# Patient Record
Sex: Female | Born: 1975
Health system: Southern US, Community
[De-identification: ages and names within clinical notes are randomized; demographics above are authoritative.]

## PROBLEM LIST (undated history)

## (undated) DIAGNOSIS — E041 Nontoxic single thyroid nodule: Secondary | ICD-10-CM

## (undated) DIAGNOSIS — K219 Gastro-esophageal reflux disease without esophagitis: Secondary | ICD-10-CM

## (undated) DIAGNOSIS — E538 Deficiency of other specified B group vitamins: Secondary | ICD-10-CM

## (undated) DIAGNOSIS — T4145XA Adverse effect of unspecified anesthetic, initial encounter: Secondary | ICD-10-CM

## (undated) DIAGNOSIS — R000 Tachycardia, unspecified: Secondary | ICD-10-CM

## (undated) DIAGNOSIS — R112 Nausea with vomiting, unspecified: Secondary | ICD-10-CM

## (undated) DIAGNOSIS — E559 Vitamin D deficiency, unspecified: Secondary | ICD-10-CM

## (undated) DIAGNOSIS — F419 Anxiety disorder, unspecified: Secondary | ICD-10-CM

## (undated) DIAGNOSIS — T8859XA Other complications of anesthesia, initial encounter: Secondary | ICD-10-CM

## (undated) DIAGNOSIS — R002 Palpitations: Secondary | ICD-10-CM

## (undated) DIAGNOSIS — K5900 Constipation, unspecified: Secondary | ICD-10-CM

## (undated) DIAGNOSIS — T7840XA Allergy, unspecified, initial encounter: Secondary | ICD-10-CM

## (undated) DIAGNOSIS — M1990 Unspecified osteoarthritis, unspecified site: Secondary | ICD-10-CM

## (undated) DIAGNOSIS — N926 Irregular menstruation, unspecified: Secondary | ICD-10-CM

## (undated) DIAGNOSIS — Z9889 Other specified postprocedural states: Secondary | ICD-10-CM

## (undated) DIAGNOSIS — F32A Depression, unspecified: Secondary | ICD-10-CM

## (undated) DIAGNOSIS — I4891 Unspecified atrial fibrillation: Secondary | ICD-10-CM

## (undated) DIAGNOSIS — R319 Hematuria, unspecified: Secondary | ICD-10-CM

## (undated) DIAGNOSIS — S90852A Superficial foreign body, left foot, initial encounter: Secondary | ICD-10-CM

## (undated) HISTORY — DX: Tachycardia, unspecified: R00.0

## (undated) HISTORY — DX: Anxiety disorder, unspecified: F41.9

## (undated) HISTORY — DX: Palpitations: R00.2

## (undated) HISTORY — DX: Allergy, unspecified, initial encounter: T78.40XA

## (undated) HISTORY — DX: Unspecified osteoarthritis, unspecified site: M19.90

## (undated) HISTORY — DX: Deficiency of other specified B group vitamins: E53.8

## (undated) HISTORY — DX: Constipation, unspecified: K59.00

## (undated) HISTORY — DX: Gastro-esophageal reflux disease without esophagitis: K21.9

## (undated) HISTORY — PX: TUBAL LIGATION: SHX77

## (undated) HISTORY — DX: Hematuria, unspecified: R31.9

## (undated) HISTORY — DX: Nontoxic single thyroid nodule: E04.1

## (undated) HISTORY — DX: Depression, unspecified: F32.A

## (undated) HISTORY — PX: SHOULDER ARTHROSCOPY: SHX128

## (undated) HISTORY — DX: Unspecified atrial fibrillation: I48.91

## (undated) HISTORY — DX: Vitamin D deficiency, unspecified: E55.9

---
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
## 2012-09-03 ENCOUNTER — Ambulatory Visit: Payer: Self-pay | Admitting: Family Medicine

## 2013-05-26 ENCOUNTER — Emergency Department (HOSPITAL_COMMUNITY)
Admission: EM | Admit: 2013-05-26 | Discharge: 2013-05-26 | Disposition: A | Discharge status: Home / Self Care | Payer: Medicaid Other | Attending: Emergency Medicine | Admitting: Emergency Medicine

## 2013-05-26 ENCOUNTER — Emergency Department (HOSPITAL_COMMUNITY): Payer: Medicaid Other

## 2013-05-26 ENCOUNTER — Encounter (HOSPITAL_COMMUNITY): Payer: Self-pay | Admitting: Physical Medicine and Rehabilitation

## 2013-05-26 DIAGNOSIS — R079 Chest pain, unspecified: Secondary | ICD-10-CM | POA: Insufficient documentation

## 2013-05-26 DIAGNOSIS — R05 Cough: Secondary | ICD-10-CM

## 2013-05-26 DIAGNOSIS — R1013 Epigastric pain: Secondary | ICD-10-CM | POA: Insufficient documentation

## 2013-05-26 DIAGNOSIS — R42 Dizziness and giddiness: Secondary | ICD-10-CM | POA: Insufficient documentation

## 2013-05-26 DIAGNOSIS — R0602 Shortness of breath: Secondary | ICD-10-CM | POA: Insufficient documentation

## 2013-05-26 DIAGNOSIS — R059 Cough, unspecified: Secondary | ICD-10-CM

## 2013-05-26 DIAGNOSIS — F17200 Nicotine dependence, unspecified, uncomplicated: Secondary | ICD-10-CM | POA: Insufficient documentation

## 2013-05-26 DIAGNOSIS — R12 Heartburn: Secondary | ICD-10-CM | POA: Insufficient documentation

## 2013-05-26 DIAGNOSIS — Z79899 Other long term (current) drug therapy: Secondary | ICD-10-CM | POA: Insufficient documentation

## 2013-05-26 DIAGNOSIS — R55 Syncope and collapse: Secondary | ICD-10-CM | POA: Insufficient documentation

## 2013-05-26 LAB — BASIC METABOLIC PANEL
CO2: 26 mEq/L (ref 19–32)
Chloride: 104 mEq/L (ref 96–112)
GFR calc Af Amer: >90 mL/min (ref >90)
Potassium: 3.8 mEq/L (ref 3.5–5.1)
Sodium: 138 mEq/L (ref 135–145)

## 2013-05-26 LAB — CBC WITH DIFFERENTIAL/PLATELET
Basophils Absolute: 0 10*3/uL (ref 0.0–0.1)
Basophils Relative: 0 % (ref 0–1)
HCT: 37.8 % (ref 36.0–46.0)
Lymphocytes Relative: 34 % (ref 12–46)
MCHC: 35.2 g/dL (ref 30.0–36.0)
Monocytes Absolute: 1.1 10*3/uL — ABNORMAL HIGH (ref 0.1–1.0)
Neutro Abs: 8.2 10*3/uL — ABNORMAL HIGH (ref 1.7–7.7)
Neutrophils Relative %: 57 % (ref 43–77)
RDW: 15.1 % (ref 11.5–15.5)
WBC: 14.4 10*3/uL — ABNORMAL HIGH (ref 4.0–10.5)

## 2013-05-26 LAB — D-DIMER, QUANTITATIVE: D-Dimer, Quant: <0.27 ug/mL-FEU (ref 0.00–0.48)

## 2013-05-26 LAB — POCT I-STAT TROPONIN I: Troponin i, poc: 0 ng/mL (ref 0.00–0.08)

## 2013-05-26 MED ORDER — SODIUM CHLORIDE 0.9 % IV BOLUS (SEPSIS)
1000.0000 mL | Freq: Once | INTRAVENOUS | Status: AC
  Administered 2013-05-26: 1000 mL via INTRAVENOUS

## 2013-05-26 MED ORDER — GI COCKTAIL ~~LOC~~
30.0000 mL | Freq: Once | ORAL | Status: AC
  Administered 2013-05-26: 30 mL via ORAL
  Filled 2013-05-26: qty 30

## 2013-05-26 NOTE — ED Notes (Signed)
Resident at bedside talking to pt per request to leave.

## 2013-05-26 NOTE — ED Notes (Signed)
Pt presents to department for evaluation of midsternal chest pain radiating to L shoulder. 6/10 at the time. Also states diaphoresis and SOB. Onset this morning. Pt is alert and oriented x4. respirations unlabored.

## 2013-05-26 NOTE — ED Provider Notes (Signed)
I supervised the resident in evaluation of the patient and was immediately available for consultation and decision making.   Rolan Bucco, MD 05/26/13 [phone]

## 2013-05-26 NOTE — ED Notes (Signed)
Pt requesting to leave AMA, "I want my papers, ekg, x-ray.  I want to go home.  This is ridiculous!"  Resident made aware of pt wanting to leave.

## 2013-05-26 NOTE — ED Provider Notes (Signed)
History     CSN: [id number]  Arrival date & time 05/26/13  1724   First MD Initiated Contact with Patient 05/26/13 2019      Chief Complaint  Patient presents with  . Chest Pain  . Dizziness    (Consider location/radiation/quality/duration/timing/severity/associated sxs/prior treatment) Patient is a 37 y.o. female presenting with chest pain.  Chest Pain Pain location:  Substernal area and epigastric Pain quality: aching and pressure   Pain radiates to:  L shoulder Pain radiates to the back: no   Pain severity:  Moderate Onset quality:  Gradual Duration: since awakening this am. Timing:  Intermittent Progression:  Waxing and waning Chronicity:  New Context: breathing, movement and raising an arm   Context: not eating, not at rest and no stress   Relieved by: being still. Worsened by:  Nothing tried Ineffective treatments:  None tried Associated symptoms: abdominal pain, cough, heartburn, near-syncope and shortness of breath   Associated symptoms: no back pain, no claudication, no dizziness, no fever, no nausea, no numbness, no orthopnea, no palpitations, no PND, no syncope and not vomiting   Risk factors: birth control and smoking   Risk factors: no prior DVT/PE     No past medical history on file.  No past surgical history on file.  No family history on file.  History  Substance Use Topics  . Smoking status: Current Every Day Smoker    Types: Cigarettes  . Smokeless tobacco: Not on file  . Alcohol Use: No    OB History   Grav Para Term Preterm Abortions TAB SAB Ect Mult Living                  Review of Systems  Constitutional: Negative for fever and chills.  HENT: Negative for congestion, sore throat and rhinorrhea.   Eyes: Negative for photophobia and visual disturbance.  Respiratory: Positive for cough and shortness of breath.   Cardiovascular: Positive for chest pain and near-syncope. Negative for palpitations, orthopnea, claudication, leg swelling,  syncope and PND.  Gastrointestinal: Positive for heartburn and abdominal pain. Negative for nausea, vomiting, diarrhea and constipation.  Endocrine: Negative for polyphagia and polyuria.  Genitourinary: Negative for dysuria, flank pain, vaginal bleeding, vaginal discharge and enuresis.  Musculoskeletal: Negative for back pain and gait problem.  Skin: Negative for color change and rash.  Neurological: Negative for dizziness, syncope, light-headedness and numbness.  Hematological: Negative for adenopathy. Does not bruise/bleed easily.  All other systems reviewed and are negative.    Allergies  Review of patient's allergies indicates no known allergies.  Home Medications   Current Outpatient Rx  Name  Route  Sig  Dispense  Refill  . desogestrel-ethinyl estradiol (EMOQUETTE) 0.15-30 MG-MCG tablet   Oral   Take 1 tablet by mouth daily.           BP 109/75  Pulse 91  Temp(Src) 99 F (37.2 C) (Oral)  Resp 16  SpO2 100%  LMP 03/26/2013  Physical Exam  Vitals reviewed. Constitutional: She is oriented to person, place, and time. She appears well-developed and well-nourished.  HENT:  Head: Normocephalic and atraumatic.  Right Ear: External ear normal.  Left Ear: External ear normal.  Eyes: Conjunctivae and EOM are normal. Pupils are equal, round, and reactive to light.  Neck: Normal range of motion. Neck supple.  Cardiovascular: Normal rate, regular rhythm, normal heart sounds and intact distal pulses.   Pulmonary/Chest: Effort normal and breath sounds normal.  Abdominal: Soft. Bowel sounds are normal. There is  tenderness in the epigastric area and left upper quadrant.  Musculoskeletal: Normal range of motion.  Neurological: She is alert and oriented to person, place, and time.  Skin: Skin is warm and dry.    ED Course  Procedures (including critical care time)  Labs Reviewed  CBC WITH DIFFERENTIAL - Abnormal; Notable for the following:    WBC 14.4 (*)    Neutro Abs 8.2  (*)    Lymphs Abs 4.9 (*)    Monocytes Absolute 1.1 (*)    All other components within normal limits  BASIC METABOLIC PANEL  D-DIMER, QUANTITATIVE  POCT I-STAT TROPONIN I   Dg Chest 2 View  05/26/2013   *RADIOLOGY REPORT*  Clinical Data:  Chest pain, dizziness and nausea.  CHEST - 2 VIEW  Comparison: None  Findings: The heart size and mediastinal contours are within normal limits.  Both lungs are clear.  The visualized skeletal structures are unremarkable.  IMPRESSION: No active disease.   Original Report Authenticated By: Irish Lack, M.D.     1. Chest pain at rest   2. Cough   3. Shortness of breath    Results for orders placed during the hospital encounter of 05/26/13  CBC WITH DIFFERENTIAL      Result Value Range   WBC 14.4 (*) 4.0 - 10.5 K/uL   RBC 4.56  3.87 - 5.11 MIL/uL   Hemoglobin 13.3  12.0 - 15.0 g/dL   HCT 16.1  09.6 - 04.5 %   MCV 82.9  78.0 - 100.0 fL   MCH 29.2  26.0 - 34.0 pg   MCHC 35.2  30.0 - 36.0 g/dL   RDW 40.9  81.1 - 91.4 %   Platelets 326  150 - 400 K/uL   Neutrophils Relative % 57  43 - 77 %   Neutro Abs 8.2 (*) 1.7 - 7.7 K/uL   Lymphocytes Relative 34  12 - 46 %   Lymphs Abs 4.9 (*) 0.7 - 4.0 K/uL   Monocytes Relative 8  3 - 12 %   Monocytes Absolute 1.1 (*) 0.1 - 1.0 K/uL   Eosinophils Relative 1  0 - 5 %   Eosinophils Absolute 0.2  0.0 - 0.7 K/uL   Basophils Relative 0  0 - 1 %   Basophils Absolute 0.0  0.0 - 0.1 K/uL  BASIC METABOLIC PANEL      Result Value Range   Sodium 138  135 - 145 mEq/L   Potassium 3.8  3.5 - 5.1 mEq/L   Chloride 104  96 - 112 mEq/L   CO2 26  19 - 32 mEq/L   Glucose, Bld 97  70 - 99 mg/dL   BUN 12  6 - 23 mg/dL   Creatinine, Ser 7.82  0.50 - 1.10 mg/dL   Calcium 9.2  8.4 - 95.6 mg/dL   GFR calc non Af Amer >90  >90 mL/min   GFR calc Af Amer >90  >90 mL/min  D-DIMER, QUANTITATIVE      Result Value Range   D-Dimer, Quant <0.27  0.00 - 0.48 ug/mL-FEU  POCT I-STAT TROPONIN I      Result Value Range   Troponin  i, poc 0.00  0.00 - 0.08 ng/mL   Comment 3               MDM   37 y.o. female  without pertinent PMH presents with pleuritic substernal/epigastric pain beginning this AM, without history of same.  Pt does not endorse GI symptoms,  however has had cough over the course of today, no hemoptysis.  She also endorses lightheadedness and near syncope.  Pain worse with arm movement, nonexertional, musculoskeletal/pulmonary in nature.  TIMI 0, unable to [facility] for HR, OCP use.  Low risk via [place], no unilateral leg swelling or recent immobilization.  Checked labs, ecg, and cxr as above, all unremarkable.  Physical exam with abd tenderness, otherwise unremarkable.  Pt given GI cocktail with some relief of symptoms.  Doubt ACS, PTX, PNA, PE, mediastinitis, pericarditis, or other emergent pathology given history, physical, and overall clinical scenario.  Additionally doubt cardiogenic cause of lightheadedness given symptoms present all day and mild in nature.  Pt given strict return precautions, voices understanding, and agreed to fu.  .    Labs and imaging as above reviewed by myself and attending,Dr. Fredderick Phenix, with whom case was discussed.   1. Chest pain at rest   2. Cough   3. Shortness of breath             Noel Gerold, MD 05/26/13 2329

## 2013-07-15 ENCOUNTER — Ambulatory Visit (INDEPENDENT_AMBULATORY_CARE_PROVIDER_SITE_OTHER): Payer: Medicaid Other | Admitting: Sports Medicine

## 2013-07-15 VITALS — BP 116/68 | HR 70 | Wt 173.0 lb

## 2013-07-15 DIAGNOSIS — M25572 Pain in left ankle and joints of left foot: Secondary | ICD-10-CM

## 2013-07-15 DIAGNOSIS — M7750 Other enthesopathy of unspecified foot: Secondary | ICD-10-CM

## 2013-07-15 DIAGNOSIS — M7742 Metatarsalgia, left foot: Secondary | ICD-10-CM

## 2013-07-15 DIAGNOSIS — S90852A Superficial foreign body, left foot, initial encounter: Secondary | ICD-10-CM | POA: Insufficient documentation

## 2013-07-15 DIAGNOSIS — M722 Plantar fascial fibromatosis: Secondary | ICD-10-CM

## 2013-07-15 NOTE — Assessment & Plan Note (Signed)
Metatarsal pad placed. Like her to come back for custom orthotics. Also would like x-rays, whether she brings me the existing films or we get new films, to further follow this history of a foreign body. 

## 2013-07-15 NOTE — Assessment & Plan Note (Addendum)
Metatarsal pad placed. Like her to come back for custom orthotics. Also would like x-rays, whether she brings me the existing films or we get new films, to further follow this history of a foreign body.

## 2013-07-15 NOTE — Progress Notes (Addendum)
  Subjective:    CC: Left foot pain  HPI:  Kelsey Vaughan is a very pleasant 37 year old female, she is my Engineer, civil (consulting).  For several years she's noted pain that she localizes both distal 2, under, and just proximal to her third metatarsal head from a plantar aspect. She does not recall any trauma nor stepping on anything. She did see a physician in the distant past, an x-ray was done but supposedly showed a foreign body, I do not have the films for review. Pain has been persistent, localized, no radiation, moderate.  Past medical history, Surgical history, Family history not pertinant except as noted below, Social history, Allergies, and medications have been entered into the medical record, reviewed, and no changes needed.   Review of Systems: No headache, visual changes, nausea, vomiting, diarrhea, constipation, dizziness, abdominal pain, skin rash, fevers, chills, night sweats, swollen lymph nodes, weight loss, chest pain, body aches, joint swelling, muscle aches, shortness of breath, mood changes, visual or auditory hallucinations.  Objective:    General: Well Developed, well nourished, and in no acute distress.  Neuro: Alert and oriented x3, extra-ocular muscles intact, sensation grossly intact.  HEENT: Normocephalic, atraumatic, pupils equal round reactive to light, neck supple, no masses, no lymphadenopathy, thyroid nonpalpable.  Skin: Warm and dry, no rashes noted.  Cardiac: Regular rate and rhythm, no murmurs rubs or gallops.  Respiratory: Clear to auscultation bilaterally. Not using accessory muscles, speaking in full sentences.  Abdominal: Soft, nontender, nondistended, positive bowel sounds, no masses, no organomegaly.  Left Foot: No visible erythema or swelling. Range of motion is full in all directions. Strength is 5/5 in all directions. No hallux valgus. No pes cavus or pes planus. No abnormal callus noted. No pain over the navicular prominence, or base of fifth metatarsal. No tenderness  to palpation of the calcaneal insertion of plantar fascia. There is some palpable tenderness under the third metatarsal head, she also has palpable plantar fascial fibromatosis. No pain at the Achilles insertion. No pain over the calcaneal bursa. No pain of the retrocalcaneal bursa. No tenderness to palpation over the tarsals, metatarsals, or phalanges. No hallux rigidus or limitus. No tenderness palpation over interphalangeal joints. No pain with compression of the metatarsal heads. Neurovascularly intact distally.  Medium metatarsal pad was placed in her Left shoe.  Impression and Recommendations:    The patient was counselled, risk factors were discussed, anticipatory guidance given.

## 2013-07-16 ENCOUNTER — Ambulatory Visit (HOSPITAL_BASED_OUTPATIENT_CLINIC_OR_DEPARTMENT_OTHER)
Admission: RE | Admit: 2013-07-16 | Discharge: 2013-07-16 | Disposition: A | Discharge status: Home / Self Care | Payer: Medicaid Other | Source: Ambulatory Visit | Attending: Sports Medicine | Admitting: Sports Medicine

## 2013-07-16 ENCOUNTER — Encounter: Payer: Self-pay | Admitting: Sports Medicine

## 2013-07-16 ENCOUNTER — Ambulatory Visit: Payer: Medicaid Other | Admitting: Sports Medicine

## 2013-07-16 ENCOUNTER — Ambulatory Visit (INDEPENDENT_AMBULATORY_CARE_PROVIDER_SITE_OTHER): Payer: Medicaid Other | Admitting: Sports Medicine

## 2013-07-16 VITALS — BP 117/62 | HR 83 | Wt 171.0 lb

## 2013-07-16 DIAGNOSIS — IMO0002 Reserved for concepts with insufficient information to code with codable children: Secondary | ICD-10-CM

## 2013-07-16 DIAGNOSIS — M25579 Pain in unspecified ankle and joints of unspecified foot: Secondary | ICD-10-CM | POA: Insufficient documentation

## 2013-07-16 DIAGNOSIS — S90852A Superficial foreign body, left foot, initial encounter: Secondary | ICD-10-CM

## 2013-07-16 DIAGNOSIS — M25572 Pain in left ankle and joints of left foot: Secondary | ICD-10-CM

## 2013-07-16 DIAGNOSIS — M722 Plantar fascial fibromatosis: Secondary | ICD-10-CM

## 2013-07-16 HISTORY — DX: Superficial foreign body, left foot, initial encounter: S90.852A

## 2013-07-16 MED ORDER — HYDROCODONE-ACETAMINOPHEN 10-325 MG PO TABS: 1.0000 | ORAL_TABLET | Freq: Three times a day (TID) | ORAL | Status: DC | PRN

## 2013-07-16 NOTE — Progress Notes (Addendum)
  Subjective:    CC: Follow up  HPI: Kelsey Vaughan comes back for followup of left foot pain. We recently got x-rays, the results of which will be dictated below. She continues that pain is localized to the plantar aspect of the foot, sharp, doesn't radiate, moderate, persistent. Worse with weightbearing.  Past medical history, Surgical history, Family history not pertinant except as noted below, Social history, Allergies, and medications have been entered into the medical record, reviewed, and no changes needed.   Review of Systems: No fevers, chills, night sweats, weight loss, chest pain, or shortness of breath.   Objective:    General: Well Developed, well nourished, and in no acute distress.  Neuro: Alert and oriented x3, extra-ocular muscles intact, sensation grossly intact.  HEENT: Normocephalic, atraumatic, pupils equal round reactive to light, neck supple, no masses, no lymphadenopathy, thyroid nonpalpable.  Skin: Warm and dry, no rashes. Cardiac: Regular rate and rhythm, no murmurs rubs or gallops, no lower extremity edema.  Respiratory: Clear to auscultation bilaterally. Not using accessory muscles, speaking in full sentences.  X-rays are reviewed and show what appears to be broken end of a needle at the level of the base of the fifth metatarsal, and just plantar to the lateral aspect of the cuboid. It is approximately 0.7 cm long, and 0.9 cm from the skin.  Procedure: Limited ultrasound of left foot Device: GE logiq E. Findings: There was a hyperechoic structure just medial to the base of the fifth metatarsal bone with acoustic shadowing. The overlying skin was marked at this point. Impression: Metallic foreign body. Images permanently stored in the unit and are available for review.  Procedure: Real-time Ultrasound Guided tibial nerve block Device: GE Logiq E  Verbal informed consent obtained.  Time-out conducted.  Noted no overlying erythema, induration, or other signs of local  infection.  Skin prepped in a sterile fashion.  Local anesthesia: Topical Ethyl chloride.  With sterile technique and under real time ultrasound guidance:  5 cc lidocaine injected around the tibial nerve just posterior to the medial malleolus. Completed without difficulty  Numbness in the plantar region of the foot within 10 minutes. Advised to call if fevers/chills, erythema, induration, drainage, or persistent bleeding.  Images permanently stored and available for review in the ultrasound unit.  Impression: Technically successful ultrasound guided peripheral nerve block.  Procedure:  Excision of  left foot foreign body Risks, benefits, and alternatives explained and consent obtained. Time out conducted. Surface prepped with alcohol. Tibial nerve block performed as above. 5cc lidocaine with epinephine infiltrated in a field block. Adequate anesthesia ensured. Area prepped and draped in a sterile fashion. 2 cm incision made with a #11 blade, extensive dissection performed with curved hemostat, I was unable to reach the foreign body although I was able to feel it at the tip of the hemostat, after 30 minutes procedure aborted. 3-0 suture used in a horizontal mattress to close the wound. A small amount of Dermabond was used over the incision. The wound was then dressed. Hemostasis achieved. Pt stable.  30 mg of intramuscular Toradol given for postoperative pain control.  Impression and Recommendations:

## 2013-07-16 NOTE — Assessment & Plan Note (Signed)
Appears to be a small needle end. Attempted exploration, was unable to find a small needle. I am going to refer her to Dr. Toni Arthurs with foot and ankle surgery. Hydrocodone 10 for pain. Toradol 30 mg intramuscular. Return on Monday to recheck wound.

## 2013-07-16 NOTE — Addendum Note (Signed)
Addended by: Monica Becton on: 07/16/2013 12:08 PM   Modules accepted: Orders

## 2013-07-16 NOTE — Addendum Note (Signed)
Addended by: Monica Becton on: 07/16/2013 09:08 AM   Modules accepted: Orders, Level of Service

## 2013-07-28 ENCOUNTER — Ambulatory Visit (INDEPENDENT_AMBULATORY_CARE_PROVIDER_SITE_OTHER): Payer: Medicaid Other | Admitting: Sports Medicine

## 2013-07-28 DIAGNOSIS — S90852D Superficial foreign body, left foot, subsequent encounter: Secondary | ICD-10-CM

## 2013-07-28 DIAGNOSIS — Z5189 Encounter for other specified aftercare: Secondary | ICD-10-CM

## 2013-07-28 NOTE — Assessment & Plan Note (Signed)
Sutures removed, she does have a followup with orthopedic surgeon for consideration of exploration and removal of the foreign body.

## 2013-07-28 NOTE — Progress Notes (Signed)
Sutures removed, wound looks good, clean, dry, and intact.

## 2013-07-29 ENCOUNTER — Ambulatory Visit: Payer: Self-pay | Admitting: Sports Medicine

## 2013-07-30 ENCOUNTER — Encounter (HOSPITAL_BASED_OUTPATIENT_CLINIC_OR_DEPARTMENT_OTHER): Payer: Self-pay | Admitting: *Deleted

## 2013-08-04 ENCOUNTER — Other Ambulatory Visit: Payer: Self-pay | Admitting: Orthopedic Surgery

## 2013-08-05 ENCOUNTER — Ambulatory Visit (HOSPITAL_BASED_OUTPATIENT_CLINIC_OR_DEPARTMENT_OTHER)
Admission: RE | Admit: 2013-08-05 | Discharge: 2013-08-05 | Disposition: A | Discharge status: Home / Self Care | Payer: Medicaid Other | Source: Ambulatory Visit | Attending: Orthopedic Surgery | Admitting: Orthopedic Surgery

## 2013-08-05 ENCOUNTER — Encounter (HOSPITAL_BASED_OUTPATIENT_CLINIC_OR_DEPARTMENT_OTHER): Payer: Self-pay | Admitting: Certified Registered Nurse Anesthetist

## 2013-08-05 ENCOUNTER — Encounter (HOSPITAL_BASED_OUTPATIENT_CLINIC_OR_DEPARTMENT_OTHER)
Admission: RE | Disposition: A | Discharge status: Home / Self Care | Payer: Self-pay | Source: Ambulatory Visit | Attending: Orthopedic Surgery

## 2013-08-05 ENCOUNTER — Ambulatory Visit (HOSPITAL_BASED_OUTPATIENT_CLINIC_OR_DEPARTMENT_OTHER): Payer: Medicaid Other | Admitting: Certified Registered Nurse Anesthetist

## 2013-08-05 DIAGNOSIS — Z6831 Body mass index (BMI) 31.0-31.9, adult: Secondary | ICD-10-CM | POA: Insufficient documentation

## 2013-08-05 DIAGNOSIS — J449 Chronic obstructive pulmonary disease, unspecified: Secondary | ICD-10-CM | POA: Insufficient documentation

## 2013-08-05 DIAGNOSIS — F17200 Nicotine dependence, unspecified, uncomplicated: Secondary | ICD-10-CM | POA: Insufficient documentation

## 2013-08-05 DIAGNOSIS — J4489 Other specified chronic obstructive pulmonary disease: Secondary | ICD-10-CM | POA: Insufficient documentation

## 2013-08-05 DIAGNOSIS — S90852D Superficial foreign body, left foot, subsequent encounter: Secondary | ICD-10-CM

## 2013-08-05 DIAGNOSIS — M795 Residual foreign body in soft tissue: Secondary | ICD-10-CM | POA: Insufficient documentation

## 2013-08-05 DIAGNOSIS — N926 Irregular menstruation, unspecified: Secondary | ICD-10-CM | POA: Insufficient documentation

## 2013-08-05 DIAGNOSIS — E669 Obesity, unspecified: Secondary | ICD-10-CM | POA: Insufficient documentation

## 2013-08-05 DIAGNOSIS — Z1810 Retained metal fragments, unspecified: Secondary | ICD-10-CM | POA: Insufficient documentation

## 2013-08-05 HISTORY — DX: Superficial foreign body, left foot, initial encounter: S90.852A

## 2013-08-05 HISTORY — DX: Irregular menstruation, unspecified: N92.6

## 2013-08-05 HISTORY — PX: FOREIGN BODY REMOVAL: SHX962

## 2013-08-05 SURGERY — FOREIGN BODY REMOVAL ADULT
Anesthesia: Regional | Site: Foot | Laterality: Left | Wound class: Clean

## 2013-08-05 MED ORDER — SODIUM CHLORIDE 0.9 % IV SOLN: INTRAVENOUS | Status: DC

## 2013-08-05 MED ORDER — HYDROMORPHONE HCL PF 1 MG/ML IJ SOLN
0.2500 mg | INTRAMUSCULAR | Status: DC | PRN
  Administered 2013-08-05: 0.5 mg via INTRAVENOUS

## 2013-08-05 MED ORDER — LIDOCAINE HCL (CARDIAC) 20 MG/ML IV SOLN
INTRAVENOUS | Status: DC | PRN
  Administered 2013-08-05: 60 mg via INTRAVENOUS

## 2013-08-05 MED ORDER — OXYCODONE HCL 5 MG PO TABS: 5.0000 mg | ORAL_TABLET | Freq: Once | ORAL | Status: DC | PRN

## 2013-08-05 MED ORDER — PROMETHAZINE HCL 25 MG/ML IJ SOLN: 6.2500 mg | INTRAMUSCULAR | Status: DC | PRN

## 2013-08-05 MED ORDER — FENTANYL CITRATE 0.05 MG/ML IJ SOLN
INTRAMUSCULAR | Status: DC | PRN
  Administered 2013-08-05 (×2): 25 ug via INTRAVENOUS
  Administered 2013-08-05 (×2): 50 ug via INTRAVENOUS

## 2013-08-05 MED ORDER — FENTANYL CITRATE 0.05 MG/ML IJ SOLN: 50.0000 ug | Freq: Once | INTRAMUSCULAR | Status: DC

## 2013-08-05 MED ORDER — OXYCODONE HCL 5 MG/5ML PO SOLN: 5.0000 mg | Freq: Once | ORAL | Status: DC | PRN

## 2013-08-05 MED ORDER — PROPOFOL 10 MG/ML IV BOLUS
INTRAVENOUS | Status: DC | PRN
  Administered 2013-08-05: 200 mg via INTRAVENOUS

## 2013-08-05 MED ORDER — FENTANYL CITRATE 0.05 MG/ML IJ SOLN: 50.0000 ug | INTRAMUSCULAR | Status: DC | PRN

## 2013-08-05 MED ORDER — CHLORHEXIDINE GLUCONATE 4 % EX LIQD: 60.0000 mL | Freq: Once | CUTANEOUS | Status: DC

## 2013-08-05 MED ORDER — LACTATED RINGERS IV SOLN
INTRAVENOUS | Status: DC
  Administered 2013-08-05: 12:00:00 via INTRAVENOUS
  Administered 2013-08-05: 20 mL/h via INTRAVENOUS

## 2013-08-05 MED ORDER — CEFAZOLIN SODIUM-DEXTROSE 2-3 GM-% IV SOLR
2.0000 g | INTRAVENOUS | Status: AC
  Administered 2013-08-05: 2 g via INTRAVENOUS

## 2013-08-05 MED ORDER — HYDROCODONE-ACETAMINOPHEN 5-325 MG PO TABS: 10.0000 | ORAL_TABLET | Freq: Four times a day (QID) | ORAL | Status: DC | PRN

## 2013-08-05 MED ORDER — MIDAZOLAM HCL 2 MG/2ML IJ SOLN: 1.0000 mg | INTRAMUSCULAR | Status: DC | PRN

## 2013-08-05 MED ORDER — MIDAZOLAM HCL 5 MG/5ML IJ SOLN
INTRAMUSCULAR | Status: DC | PRN
  Administered 2013-08-05: 2 mg via INTRAVENOUS

## 2013-08-05 MED ORDER — BUPIVACAINE HCL (PF) 0.25 % IJ SOLN
INTRAMUSCULAR | Status: DC | PRN
  Administered 2013-08-05: 3 mL

## 2013-08-05 MED ORDER — MIDAZOLAM HCL 2 MG/ML PO SYRP: 12.0000 mg | ORAL_SOLUTION | Freq: Once | ORAL | Status: DC | PRN

## 2013-08-05 MED ORDER — DEXAMETHASONE SODIUM PHOSPHATE 10 MG/ML IJ SOLN
INTRAMUSCULAR | Status: DC | PRN
  Administered 2013-08-05: 10 mg via INTRAVENOUS

## 2013-08-05 SURGICAL SUPPLY — 63 items
BAG DECANTER FOR FLEXI CONT (MISCELLANEOUS) ×2 IMPLANT
BANDAGE ESMARK 6X9 LF (GAUZE/BANDAGES/DRESSINGS) IMPLANT
BLADE SURG 15 STRL LF DISP TIS (BLADE) ×1 IMPLANT
BLADE SURG 15 STRL SS (BLADE) ×1
BNDG COHESIVE 4X5 TAN STRL (GAUZE/BANDAGES/DRESSINGS) ×2 IMPLANT
BNDG COHESIVE 6X5 TAN STRL LF (GAUZE/BANDAGES/DRESSINGS) IMPLANT
BNDG ESMARK 4X9 LF (GAUZE/BANDAGES/DRESSINGS) ×2 IMPLANT
BNDG ESMARK 6X9 LF (GAUZE/BANDAGES/DRESSINGS)
CHLORAPREP W/TINT 26ML (MISCELLANEOUS) ×2 IMPLANT
CLOTH BEACON ORANGE TIMEOUT ST (SAFETY) ×2 IMPLANT
COVER TABLE BACK 60X90 (DRAPES) ×2 IMPLANT
CUFF TOURNIQUET SINGLE 34IN LL (TOURNIQUET CUFF) ×2 IMPLANT
DECANTER SPIKE VIAL GLASS SM (MISCELLANEOUS) IMPLANT
DRAPE EXTREMITY T 121X128X90 (DRAPE) ×2 IMPLANT
DRAPE OEC MINIVIEW 54X84 (DRAPES) ×2 IMPLANT
DRAPE SURG 17X23 STRL (DRAPES) IMPLANT
DRAPE U-SHAPE 47X51 STRL (DRAPES) ×2 IMPLANT
DRSG EMULSION OIL 3X3 NADH (GAUZE/BANDAGES/DRESSINGS) ×2 IMPLANT
DRSG PAD ABDOMINAL 8X10 ST (GAUZE/BANDAGES/DRESSINGS) IMPLANT
ELECT REM PT RETURN 9FT ADLT (ELECTROSURGICAL) ×2
ELECTRODE REM PT RTRN 9FT ADLT (ELECTROSURGICAL) ×1 IMPLANT
GLOVE BIO SURGEON STRL SZ8 (GLOVE) ×2 IMPLANT
GLOVE BIOGEL M STRL SZ7.5 (GLOVE) ×2 IMPLANT
GLOVE BIOGEL PI IND STRL 8 (GLOVE) ×2 IMPLANT
GLOVE BIOGEL PI INDICATOR 8 (GLOVE) ×2
GLOVE EXAM NITRILE MD LF STRL (GLOVE) IMPLANT
GOWN PREVENTION PLUS XLARGE (GOWN DISPOSABLE) ×2 IMPLANT
GOWN PREVENTION PLUS XXLARGE (GOWN DISPOSABLE) ×2 IMPLANT
NEEDLE HYPO 22GX1.5 SAFETY (NEEDLE) IMPLANT
PACK BASIN DAY SURGERY FS (CUSTOM PROCEDURE TRAY) ×2 IMPLANT
PAD CAST 4YDX4 CTTN HI CHSV (CAST SUPPLIES) ×1 IMPLANT
PADDING CAST ABS 4INX4YD NS (CAST SUPPLIES)
PADDING CAST ABS COTTON 4X4 ST (CAST SUPPLIES) IMPLANT
PADDING CAST COTTON 4X4 STRL (CAST SUPPLIES) ×1
PADDING CAST COTTON 6X4 STRL (CAST SUPPLIES) IMPLANT
PENCIL BUTTON HOLSTER BLD 10FT (ELECTRODE) IMPLANT
SANITIZER HAND PURELL 535ML FO (MISCELLANEOUS) ×2 IMPLANT
SHEET MEDIUM DRAPE 40X70 STRL (DRAPES) ×2 IMPLANT
SPLINT FAST PLASTER 5X30 (CAST SUPPLIES)
SPLINT PLASTER CAST FAST 5X30 (CAST SUPPLIES) IMPLANT
SPONGE GAUZE 4X4 12PLY (GAUZE/BANDAGES/DRESSINGS) ×2 IMPLANT
SPONGE LAP 18X18 X RAY DECT (DISPOSABLE) ×2 IMPLANT
STAPLER VISISTAT 35W (STAPLE) IMPLANT
STOCKINETTE 6  STRL (DRAPES) ×1
STOCKINETTE 6 STRL (DRAPES) ×1 IMPLANT
STRIP CLOSURE SKIN 1/2X4 (GAUZE/BANDAGES/DRESSINGS) IMPLANT
SUCTION FRAZIER TIP 10 FR DISP (SUCTIONS) IMPLANT
SUT ETHIBOND 0 MO6 C/R (SUTURE) IMPLANT
SUT ETHIBOND 2 OS 4 DA (SUTURE) IMPLANT
SUT ETHILON 3 0 PS 1 (SUTURE) ×2 IMPLANT
SUT MNCRL AB 3-0 PS2 18 (SUTURE) ×2 IMPLANT
SUT MNCRL AB 4-0 PS2 18 (SUTURE) IMPLANT
SUT VIC AB 0 CT1 27 (SUTURE)
SUT VIC AB 0 CT1 27XBRD ANBCTR (SUTURE) IMPLANT
SUT VIC AB 2-0 SH 18 (SUTURE) IMPLANT
SUT VIC AB 2-0 SH 27 (SUTURE)
SUT VIC AB 2-0 SH 27XBRD (SUTURE) IMPLANT
SUT VICRYL 4-0 PS2 18IN ABS (SUTURE) IMPLANT
SYR BULB 3OZ (MISCELLANEOUS) ×2 IMPLANT
TOWEL OR 17X24 6PK STRL BLUE (TOWEL DISPOSABLE) ×2 IMPLANT
TOWEL OR NON WOVEN STRL DISP B (DISPOSABLE) ×2 IMPLANT
TUBE CONNECTING 20X1/4 (TUBING) IMPLANT
UNDERPAD 30X30 INCONTINENT (UNDERPADS AND DIAPERS) ×2 IMPLANT

## 2013-08-05 NOTE — Brief Op Note (Signed)
08/05/2013  1:05 PM  PATIENT:  Kelsey Vaughan  37 y.o. female  PRE-OPERATIVE DIAGNOSIS:  left foot foreign body  POST-OPERATIVE DIAGNOSIS:  Left Foot foreign body  Procedure(s): 1.  Removal of foreign body from the deep soft tissues of the plantar left foot 2.  AP and lateral radiographs of the left foot  SURGEON:  Toni Arthurs, MD  ASSISTANT: n/a  ANESTHESIA:   General  EBL:  minimal  TOURNIQUET:  Less than 10 min with an ankle esmarch  COMPLICATIONS:  None apparent  DISPOSITION:  Extubated, awake and stable to recovery.  DICTATION ID:  [id number]

## 2013-08-05 NOTE — Op Note (Deleted)
NAME:  Sonier, Cova           ACCOUNT NO.:  [id number]  MEDICAL RECORD NO.:  [id number]  LOCATION:  XRAY                          FACILITY:  [facility]  PHYSICIAN:  Brendia Dampier, MD        DATE OF BIRTH:  12/26/1975  DATE OF PROCEDURE:  08/05/2013 DATE OF DISCHARGE:  07/16/2013                              OPERATIVE REPORT   ADDENDUM:  RADIOGRAPHS:  AP and lateral radiographs of the left foot are obtained intraoperatively today.  These show interval removal of the metallic foreign body from the deep plantar soft tissues of the left foot.  No fracture dislocation is noted.  Alignment is otherwise normal.     Jatorian Renault, MD     JH/MEDQ  D:  08/05/2013  T:  08/05/2013  Job:  [id number] 

## 2013-08-05 NOTE — Anesthesia Procedure Notes (Signed)
Procedure Name: LMA Insertion Date/Time: 08/05/2013 12:32 PM Performed by: Annikah Lovins D Pre-anesthesia Checklist: Patient identified, Emergency Drugs available, Suction available and Patient being monitored Patient Re-evaluated:Patient Re-evaluated prior to inductionOxygen Delivery Method: Circle System Utilized Preoxygenation: Pre-oxygenation with 100% oxygen Intubation Type: IV induction Ventilation: Mask ventilation without difficulty LMA: LMA inserted LMA Size: 4.0 Number of attempts: 1 Airway Equipment and Method: bite block Placement Confirmation: positive ETCO2 Tube secured with: Tape Dental Injury: Teeth and Oropharynx as per pre-operative assessment

## 2013-08-05 NOTE — Op Note (Signed)
Kelsey Vaughan, Kelsey Vaughan NO.:  [id number]  MEDICAL RECORD NO.:  [id number]  LOCATION:  XRAY                          FACILITY:  [facility]  PHYSICIAN:  Toni Arthurs, MD        DATE OF BIRTH:  1976-06-14  DATE OF PROCEDURE:  08/05/2013 DATE OF DISCHARGE:  07/16/2013                              OPERATIVE REPORT   PREOPERATIVE DIAGNOSIS:  Painful foreign body, left foot.  POSTOPERATIVE DIAGNOSIS:  Painful foreign body, left foot.  PROCEDURES: 1. Removal of foreign body from the deep soft tissues of the plantar     left foot. 2. AP and lateral radiographs of the left foot pain.  SURGEON:  Toni Arthurs, MD.  ANESTHESIA:  General.  ESTIMATED BLOOD LOSS:  Minimal.  TOURNIQUET TIME:  Less than 10 minutes with an ankle Esmarch.  COMPLICATIONS:  None apparent.  DISPOSITION:  Extubated, awake, and stable to recovery.  INDICATION FOR PROCEDURE:  The patient is a 37 year old woman who began complaining of pain on the plantar aspect of the left foot several weeks ago.  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  This was unsuccessful and she was sent to me for further evaluation and treatment of this condition.  She presents now for removal of this metallic foreign body from the deep soft tissues of the plantar left foot.  She understands the risks and benefits, the alternative treatment options, and elects surgical treatment.  She specifically understands the risks of bleeding, infection, nerve damage, blood clots, need for additional surgery, amputation, and death.  PROCEDURE IN DETAIL:  After preoperative consent was obtained, the correct operative site was identified.  The patient was brought to the operating room and placed supine on the operating table.  General anesthesia was induced.  Preoperative antibiotics were administered. Surgical time-out was taken.  Left lower extremity was  prepped and draped in standard sterile fashion.  The foot was exsanguinated and a 4- inch Esmarch tourniquet was wrapped around the ankle.  The patient's previous incision was identified.  It was again opened sharply with a #15 blade.  AP and lateral fluoroscopic imaging was utilized to grasp the foreign body with a hemostat.  It was removed without difficulty. The wound was then curetted of all adjacent fibrinous tissue.  The wound was irrigated copiously.  Subcutaneous tissue was infiltrated with 0.25% plain Marcaine.  The simple 3-0 nylon suture was used to close the skin incision.  Sterile dressings were applied followed by compression wrap. The patient was then awakened from anesthesia and transported to the recovery room in stable condition.  FOLLOWUP PLAN:  She will be weightbearing as tolerated on her left foot and a hard-sole shoe.  She will follow up with me in 2 weeks for a wound check.     Toni Arthurs, MD     JH/MEDQ  D:  08/05/2013  T:  08/05/2013  Job:  [id number]

## 2013-08-05 NOTE — Anesthesia Postprocedure Evaluation (Signed)
  Anesthesia Post-op Note  Patient: Kelsey Vaughan  Procedure(s) Performed: Procedure(s): LEFT FOOT FOREIGN BODY REMOVAL ADULT (Left)  Patient Location: PACU  Anesthesia Type:General  Level of Consciousness: awake and alert   Airway and Oxygen Therapy: Patient Spontanous Breathing  Post-op Pain: mild  Post-op Assessment: Post-op Vital signs reviewed, Patient's Cardiovascular Status Stable, Respiratory Function Stable, Patent Airway, No signs of Nausea or vomiting, Adequate PO intake and Pain level controlled  Post-op Vital Signs: stable  Complications: No apparent anesthesia complications

## 2013-08-05 NOTE — Op Note (Cosign Needed)
NAMEMEAGHEN, VECCHIARELLI NO.:  [id number]  MEDICAL RECORD NO.:  [id number]  LOCATION:  XRAY                          FACILITY:  [facility]  PHYSICIAN:  Toni Arthurs, MD        DATE OF BIRTH:  1976/02/14  DATE OF PROCEDURE:  08/05/2013 DATE OF DISCHARGE:  07/16/2013                              OPERATIVE REPORT   ADDENDUM:  RADIOGRAPHS:  AP and lateral radiographs of the left foot are obtained intraoperatively today.  These show interval removal of the metallic foreign body from the deep plantar soft tissues of the left foot.  No fracture dislocation is noted.  Alignment is otherwise normal.     Toni Arthurs, MD     JH/MEDQ  D:  08/05/2013  T:  08/05/2013  Job:  [id number]

## 2013-08-05 NOTE — Transfer of Care (Signed)
Immediate Anesthesia Transfer of Care Note  Patient: Kelsey Vaughan  Procedure(s) Performed: Procedure(s): LEFT FOOT FOREIGN BODY REMOVAL ADULT (Left)  Patient Location: PACU  Anesthesia Type:General  Level of Consciousness: awake and patient cooperative  Airway & Oxygen Therapy: Patient Spontanous Breathing and Patient connected to face mask oxygen  Post-op Assessment: Report given to PACU RN and Post -op Vital signs reviewed and stable  Post vital signs: Reviewed and stable  Complications: No apparent anesthesia complications

## 2013-08-05 NOTE — H&P (Signed)
Kelsey Vaughan is an 37 y.o. female.   Chief Complaint: left foot deep plantar foreign body HPI: 37 y/o female with pain at the plantar foot.  She was found on xray to have a metallic foreign body in the deep plantar soft tissues.  Office attempt at removal was unsuccessful.  She presents now for removal of the foreign body.  Past Medical History  Diagnosis Date  . Foreign body in foot, left 07/2013  . Irregular menses     due to oral contraceptive    Past Surgical History  Procedure Laterality Date  . Cesarean section    . Shoulder arthroscopy Left     History reviewed. No pertinent family history. Social History:  reports that she has been smoking Cigarettes.  She has been smoking about 0.00 packs per day for the past 2 years. She has never used smokeless tobacco. She reports that  drinks alcohol. She reports that she does not use illicit drugs.  Allergies: No Known Allergies  No prescriptions prior to admission    No results found for this or any previous visit (from the past 48 hour(s)). No results found.  ROS  No recent f/c/n/v/wt loss.  Height 5\' 2"  (1.575 m), weight 78.926 kg (174 lb). Physical Exam  wn wd woman in nad.  A and O x 4.  Mood and affect normal. EOMI.  Resp unlabored.  5/5 strength in PF and DF of the ankle.  Sens to LT intact.  No lymphadenopathy.  TTP at ;plantar foot beneath the cuboid. Assessment/Plan Left foot plantar foreign body.  To OR for removal of foreign body.  The risks and benefits of the alternative treatment options have been discussed in detail.  The patient wishes to proceed with surgery and specifically understands risks of bleeding, infection, nerve damage, blood clots, need for additional surgery, amputation and death.   Toni Arthurs 08/09/2013, 7:21 AM

## 2013-08-05 NOTE — Anesthesia Preprocedure Evaluation (Addendum)
Anesthesia Evaluation  Patient identified by MRN, date of birth, ID band Patient awake    Reviewed: Allergy & Precautions, H&P , NPO status , Patient's Chart, lab work & pertinent test results  Airway Mallampati: II TM Distance: >3 FB Neck ROM: Full    Dental   Pulmonary COPDCurrent Smoker,  + rhonchi         Cardiovascular Rhythm:Regular Rate:Normal     Neuro/Psych    GI/Hepatic   Endo/Other    Renal/GU      Musculoskeletal   Abdominal (+) + obese,   Peds  Hematology   Anesthesia Other Findings   Reproductive/Obstetrics                          Anesthesia Physical Anesthesia Plan  ASA: II  Anesthesia Plan: General   Post-op Pain Management:    Induction: Intravenous  Airway Management Planned: LMA  Additional Equipment:   Intra-op Plan:   Post-operative Plan: Extubation in OR  Informed Consent: I have reviewed the patients History and Physical, chart, labs and discussed the procedure including the risks, benefits and alternatives for the proposed anesthesia with the patient or authorized representative who has indicated his/her understanding and acceptance.     Plan Discussed with: CRNA and Surgeon  Anesthesia Plan Comments:         Anesthesia Quick Evaluation

## 2013-08-06 ENCOUNTER — Encounter (HOSPITAL_BASED_OUTPATIENT_CLINIC_OR_DEPARTMENT_OTHER): Payer: Self-pay | Admitting: Orthopedic Surgery

## 2013-08-10 ENCOUNTER — Ambulatory Visit (INDEPENDENT_AMBULATORY_CARE_PROVIDER_SITE_OTHER): Payer: Medicaid Other | Admitting: Sports Medicine

## 2013-08-10 DIAGNOSIS — S90852D Superficial foreign body, left foot, subsequent encounter: Secondary | ICD-10-CM

## 2013-08-10 DIAGNOSIS — IMO0002 Reserved for concepts with insufficient information to code with codable children: Secondary | ICD-10-CM

## 2013-08-10 NOTE — Assessment & Plan Note (Signed)
Wound is clean, dry, intact. Strap with compressive dressing. I think we can remove the suture in about a week.

## 2013-08-10 NOTE — Progress Notes (Signed)
  Subjective:    CC: Followup  HPI: Jaliza saw Dr. Toni Arthurs with [facility], he was able to remove the foreign body under fluoroscopic guidance.  She is postop day #5. Overall she continues to do well.  Past medical history, Surgical history, Family history not pertinant except as noted below, Social history, Allergies, and medications have been entered into the medical record, reviewed, and no changes needed.   Review of Systems: No fevers, chills, night sweats, weight loss, chest pain, or shortness of breath.   Objective:    General: Well Developed, well nourished, and in no acute distress.  Neuro: Alert and oriented x3, extra-ocular muscles intact, sensation grossly intact.  HEENT: Normocephalic, atraumatic, pupils equal round reactive to light, neck supple, no masses, no lymphadenopathy, thyroid nonpalpable.  Skin: Warm and dry, no rashes. Cardiac: Regular rate and rhythm, no murmurs rubs or gallops, no lower extremity edema.  Respiratory: Clear to auscultation bilaterally. Not using accessory muscles, speaking in full sentences. Foot was examined, a single simple interrupted stitch is in place, is clean, dry, intact.  Impression and Recommendations:

## 2013-08-13 ENCOUNTER — Ambulatory Visit: Payer: Medicaid Other | Admitting: Sports Medicine

## 2013-08-17 ENCOUNTER — Ambulatory Visit (INDEPENDENT_AMBULATORY_CARE_PROVIDER_SITE_OTHER): Payer: Medicaid Other | Admitting: Sports Medicine

## 2013-08-17 DIAGNOSIS — IMO0002 Reserved for concepts with insufficient information to code with codable children: Secondary | ICD-10-CM

## 2013-08-17 DIAGNOSIS — S90852D Superficial foreign body, left foot, subsequent encounter: Secondary | ICD-10-CM

## 2013-08-17 NOTE — Assessment & Plan Note (Addendum)
Sutures removed. She will come back at some point for custom orthotics.

## 2013-08-17 NOTE — Progress Notes (Signed)
  Subjective:    CC: Followup  HPI: Kelsey Vaughan is postop day 5 from foreign body removal from her foot. Surgery was performed by Dr. Toni Arthurs. She is here and asks me to remove the sutures. Overall she's doing well, pain continues to improve.  Past medical history, Surgical history, Family history not pertinant except as noted below, Social history, Allergies, and medications have been entered into the medical record, reviewed, and no changes needed.   Review of Systems: No fevers, chills, night sweats, weight loss, chest pain, or shortness of breath.   Objective:    General: Well Developed, well nourished, and in no acute distress.  Neuro: Alert and oriented x3, extra-ocular muscles intact, sensation grossly intact.  HEENT: Normocephalic, atraumatic, pupils equal round reactive to light, neck supple, no masses, no lymphadenopathy, thyroid nonpalpable.  Skin: Warm and dry, no rashes. Cardiac: Regular rate and rhythm, no murmurs rubs or gallops, no lower extremity edema.  Respiratory: Clear to auscultation bilaterally. Not using accessory muscles, speaking in full sentences. Left Foot: No visible erythema or swelling. Range of motion is full in all directions. Strength is 5/5 in all directions. No hallux valgus. No pes cavus or pes planus. No abnormal callus noted. No pain over the navicular prominence, or base of fifth metatarsal. No tenderness to palpation of the calcaneal insertion of plantar fascia. No pain at the Achilles insertion. No pain over the calcaneal bursa. No pain of the retrocalcaneal bursa. No tenderness to palpation over the tarsals, metatarsals, or phalanges. No hallux rigidus or limitus. No tenderness palpation over interphalangeal joints. No pain with compression of the metatarsal heads. Neurovascularly intact distally. Incision is clean, dry, intact. Sutures were removed, I placed Steri-Strips to take some of the tension across the wound.  Impression and  Recommendations:

## 2013-09-08 ENCOUNTER — Other Ambulatory Visit: Payer: Self-pay | Admitting: Sports Medicine

## 2013-09-08 DIAGNOSIS — B354 Tinea corporis: Secondary | ICD-10-CM | POA: Insufficient documentation

## 2013-09-08 MED ORDER — CLOTRIMAZOLE-BETAMETHASONE 1-0.05 % EX CREA: TOPICAL_CREAM | Freq: Two times a day (BID) | CUTANEOUS | Status: DC

## 2013-09-08 NOTE — Progress Notes (Signed)
Examination shows what appears to be renal on the left eyelid and right sided neck.  Lotrisone twice a day, continue until rash is completely gone for 2 days.

## 2013-09-08 NOTE — Assessment & Plan Note (Signed)
Lotrisone. Return as needed.

## 2013-09-21 ENCOUNTER — Other Ambulatory Visit: Payer: Self-pay | Admitting: Sports Medicine

## 2013-09-21 MED ORDER — FLUTICASONE PROPIONATE 50 MCG/ACT NA SUSP: NASAL | Status: DC

## 2013-10-05 ENCOUNTER — Other Ambulatory Visit: Payer: Self-pay | Admitting: Sports Medicine

## 2013-10-05 MED ORDER — MELOXICAM 15 MG PO TABS: 15.0000 mg | ORAL_TABLET | Freq: Every day | ORAL | Status: DC

## 2013-10-07 ENCOUNTER — Other Ambulatory Visit: Payer: Self-pay | Admitting: Sports Medicine

## 2013-10-18 ENCOUNTER — Other Ambulatory Visit: Payer: Self-pay | Admitting: Sports Medicine

## 2013-10-18 MED ORDER — MOMETASONE FUROATE 50 MCG/ACT NA SUSP: 2.0000 | Freq: Every day | NASAL | Status: DC

## 2013-10-19 ENCOUNTER — Other Ambulatory Visit: Payer: Self-pay | Admitting: Sports Medicine

## 2013-10-19 DIAGNOSIS — J0111 Acute recurrent frontal sinusitis: Secondary | ICD-10-CM | POA: Insufficient documentation

## 2013-10-19 DIAGNOSIS — J0110 Acute frontal sinusitis, unspecified: Secondary | ICD-10-CM

## 2013-10-19 MED ORDER — AZITHROMYCIN 250 MG PO TABS: ORAL_TABLET | ORAL | Status: DC

## 2013-10-19 NOTE — Assessment & Plan Note (Signed)
Continue intranasal steroids, adding azithromycin.

## 2013-10-20 ENCOUNTER — Other Ambulatory Visit: Payer: Self-pay | Admitting: Sports Medicine

## 2013-10-20 MED ORDER — FEXOFENADINE HCL 180 MG PO TABS: 180.0000 mg | ORAL_TABLET | Freq: Every day | ORAL | Status: DC

## 2013-10-22 MED ORDER — FLUCONAZOLE 150 MG PO TABS: 150.0000 mg | ORAL_TABLET | Freq: Once | ORAL | Status: DC

## 2013-10-22 NOTE — Addendum Note (Signed)
Addended by: Monica Becton on: 10/22/2013 09:15 AM   Modules accepted: Orders

## 2013-12-06 ENCOUNTER — Ambulatory Visit: Payer: Medicaid Other | Admitting: Sports Medicine

## 2013-12-07 ENCOUNTER — Ambulatory Visit (INDEPENDENT_AMBULATORY_CARE_PROVIDER_SITE_OTHER): Payer: Medicaid Other | Admitting: Sports Medicine

## 2013-12-07 DIAGNOSIS — M25579 Pain in unspecified ankle and joints of unspecified foot: Secondary | ICD-10-CM

## 2013-12-07 DIAGNOSIS — M25572 Pain in left ankle and joints of left foot: Secondary | ICD-10-CM

## 2013-12-07 MED ORDER — DICLOFENAC SODIUM 1 % TD GEL: 4.0000 g | Freq: Four times a day (QID) | TRANSDERMAL | Status: DC

## 2013-12-07 NOTE — Progress Notes (Signed)
  Subjective:    CC: Ankle pain  HPI: Kelsey Vaughan has had multiple lateral ankle sprains, she comes in with several year history of pain that she localizes over lateral ankle joint, worse with weightbearing. I did build her custom orthotics, and this is slightly improved her symptoms. She has not been taking Mobic, and not been doing any rehabilitation exercises at this point. Pain is localized, mild, persistent without radiation.  Past medical history, Surgical history, Family history not pertinant except as noted below, Social history, Allergies, and medications have been entered into the medical record, reviewed, and no changes needed.   Review of Systems: No fevers, chills, night sweats, weight loss, chest pain, or shortness of breath.   Objective:    General: Well Developed, well nourished, and in no acute distress.  Neuro: Alert and oriented x3, extra-ocular muscles intact, sensation grossly intact.  HEENT: Normocephalic, atraumatic, pupils equal round reactive to light, neck supple, no masses, no lymphadenopathy, thyroid nonpalpable.  Skin: Warm and dry, no rashes. Cardiac: Regular rate and rhythm, no murmurs rubs or gallops, no lower extremity edema.  Respiratory: Clear to auscultation bilaterally. Not using accessory muscles, speaking in full sentences. Left Ankle: No visible erythema or swelling. Range of motion is full in all directions. Strength is 5/5 in all directions. Stable lateral and medial ligaments; squeeze test and kleiger test unremarkable; Tender to palpation over the sinus tarsi, and lateral talar dome at its articulation with the fibula. No pain at base of 5th MT; No tenderness over cuboid; No tenderness over N spot or navicular prominence No tenderness on posterior aspects of lateral and medial malleolus No sign of peroneal tendon subluxations or tenderness to palpation Negative tarsal tunnel tinel's Able to walk 4 steps.  Impression and Recommendations:

## 2013-12-07 NOTE — Assessment & Plan Note (Signed)
Pain is localized over the sinus tarsi, and on the anterolateral fibulotalar joint. She does have constant orthotics which do improve the symptoms. We are going to restart meloxicam and I am going to add topical diclofenac gel. She will do home rehabilitation exercises and if no better in one month we can certain consider a lateral mortise injection.

## 2014-05-17 ENCOUNTER — Encounter: Payer: Self-pay | Admitting: Sports Medicine

## 2014-05-17 ENCOUNTER — Ambulatory Visit (INDEPENDENT_AMBULATORY_CARE_PROVIDER_SITE_OTHER): Payer: Medicaid Other

## 2014-05-17 ENCOUNTER — Ambulatory Visit (INDEPENDENT_AMBULATORY_CARE_PROVIDER_SITE_OTHER): Payer: Self-pay

## 2014-05-17 ENCOUNTER — Ambulatory Visit (INDEPENDENT_AMBULATORY_CARE_PROVIDER_SITE_OTHER): Payer: Medicaid Other | Admitting: Sports Medicine

## 2014-05-17 VITALS — BP 113/73 | HR 110 | Wt 177.0 lb

## 2014-05-17 DIAGNOSIS — IMO0002 Reserved for concepts with insufficient information to code with codable children: Secondary | ICD-10-CM

## 2014-05-17 DIAGNOSIS — M542 Cervicalgia: Secondary | ICD-10-CM

## 2014-05-17 DIAGNOSIS — R6884 Jaw pain: Secondary | ICD-10-CM

## 2014-05-17 DIAGNOSIS — T7411XA Adult physical abuse, confirmed, initial encounter: Secondary | ICD-10-CM
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
## 2014-05-19 NOTE — Addendum Note (Signed)
Addended by: Silverio Decamp on: 05/19/2014 10:26 AM   Modules accepted: Orders

## 2014-05-20 LAB — HEPATITIS PANEL, ACUTE
HCV Ab: NEGATIVE
Hep A IgM: NONREACTIVE
Hep B C IgM: NONREACTIVE
Hepatitis B Surface Ag: NEGATIVE

## 2014-05-20 LAB — GC/CHLAMYDIA PROBE AMP, URINE
Chlamydia, Swab/Urine, PCR: NEGATIVE
GC Probe Amp, Urine: NEGATIVE

## 2014-05-20 LAB — HIV ANTIBODY (ROUTINE TESTING W REFLEX): HIV 1&2 Ab, 4th Generation: NONREACTIVE

## 2014-05-20 LAB — HSV(HERPES SMPLX)ABS-I+II(IGG+IGM)-BLD
HSV 1 Glycoprotein G Ab, IgG: <0.1 IV
HSV 2 Glycoprotein G Ab, IgG: 10.05 IV — ABNORMAL HIGH
Herpes Simplex Vrs I&II-IgM Ab (EIA): 0.38 {index}

## 2014-05-20 LAB — RPR

## 2014-05-31 ENCOUNTER — Other Ambulatory Visit: Payer: Self-pay | Admitting: Sports Medicine

## 2014-05-31 MED ORDER — DESOGESTREL-ETHINYL ESTRADIOL 0.15-30 MG-MCG PO TABS: 1.0000 | ORAL_TABLET | Freq: Every day | ORAL | Status: DC

## 2014-07-18 ENCOUNTER — Other Ambulatory Visit: Payer: Self-pay | Admitting: Sports Medicine

## 2014-07-18 MED ORDER — CYCLOBENZAPRINE HCL 10 MG PO TABS: ORAL_TABLET | ORAL | Status: DC

## 2014-09-07 ENCOUNTER — Other Ambulatory Visit: Payer: Self-pay | Admitting: Sports Medicine

## 2014-09-07 MED ORDER — VALACYCLOVIR HCL 1 G PO TABS: 1000.0000 mg | ORAL_TABLET | Freq: Two times a day (BID) | ORAL | Status: DC

## 2014-09-09 ENCOUNTER — Ambulatory Visit (INDEPENDENT_AMBULATORY_CARE_PROVIDER_SITE_OTHER): Payer: Self-pay | Admitting: Sports Medicine

## 2014-09-09 VITALS — BP 107/73 | HR 93

## 2014-09-09 DIAGNOSIS — R350 Frequency of micturition: Secondary | ICD-10-CM

## 2014-09-09 DIAGNOSIS — N3000 Acute cystitis without hematuria: Secondary | ICD-10-CM

## 2014-09-09 DIAGNOSIS — N3001 Acute cystitis with hematuria: Secondary | ICD-10-CM

## 2014-09-09 LAB — POCT URINALYSIS DIPSTICK
Bilirubin, UA: NEGATIVE
Glucose, UA: NEGATIVE
Nitrite, UA: NEGATIVE
Protein, UA: 100
Spec Grav, UA: 1.02
Urobilinogen, UA: 1
pH, UA: 7.5

## 2014-09-09 MED ORDER — CEPHALEXIN 500 MG PO CAPS: 500.0000 mg | ORAL_CAPSULE | Freq: Two times a day (BID) | ORAL | Status: DC

## 2014-09-09 MED ORDER — PHENAZOPYRIDINE HCL 200 MG PO TABS: 200.0000 mg | ORAL_TABLET | Freq: Three times a day (TID) | ORAL | Status: AC

## 2014-09-09 NOTE — Progress Notes (Signed)
   Subjective:    Patient ID: Kelsey Vaughan, female    DOB: 03-15-1976, 38 y.o.   MRN: [id number]  HPI Kelsey Vaughan reports that she has had frequency of urination for approximately 3 days. Denies fever and abd pain. Kelsey Vaughan, CMA  Patient has a history of hematuria that does not clear despite antibiotics. No constitutional symptoms.  Review of Systems     Objective:   Physical Exam Afebrile, vital signs stable. Abdomen: Soft, nontender, nondistended, normal bowel sounds, no palpable masses, no costovertebral angle pain.  Urinalysis shows leukocytes and blood.    Assessment & Plan:

## 2014-09-09 NOTE — Assessment & Plan Note (Signed)
Keflex, Pyridium Urine culture. She has also had hematuria on multiple occasions, she has never had her urinary tract imaged so I am going to add ultrasound.

## 2014-09-09 NOTE — Addendum Note (Signed)
Addended by: Margette Fast on: 09/09/2014 02:01 PM   Modules accepted: Orders

## 2014-09-11 LAB — URINE CULTURE: Colony Count: 45000

## 2014-09-14 ENCOUNTER — Ambulatory Visit (INDEPENDENT_AMBULATORY_CARE_PROVIDER_SITE_OTHER): Payer: Self-pay | Admitting: Sports Medicine

## 2014-09-14 VITALS — BP 100/70 | HR 80 | Resp 18 | Wt 177.0 lb

## 2014-09-14 DIAGNOSIS — Q809 Congenital ichthyosis, unspecified: Secondary | ICD-10-CM | POA: Insufficient documentation

## 2014-09-14 DIAGNOSIS — Q828 Other specified congenital malformations of skin: Secondary | ICD-10-CM

## 2014-09-14 DIAGNOSIS — B354 Tinea corporis: Secondary | ICD-10-CM

## 2014-09-14 MED ORDER — CLOBETASOL PROPIONATE 0.05 % EX CREA: 1.0000 "application " | TOPICAL_CREAM | Freq: Two times a day (BID) | CUTANEOUS | Status: DC

## 2014-09-14 NOTE — Progress Notes (Signed)
  Subjective:    CC: Rash on hand  HPI: This is a pleasant 38 year old female, for 2 weeks she's noted a rash on her hand first web space, sore, cracking skin. Symptoms are moderate, persistent without radiation. No trauma. Not really pruritic.  Past medical history, Surgical history, Family history not pertinant except as noted below, Social history, Allergies, and medications have been entered into the medical record, reviewed, and no changes needed.   Review of Systems: No fevers, chills, night sweats, weight loss, chest pain, or shortness of breath.   Objective:    General: Well Developed, well nourished, and in no acute distress.  Neuro: Alert and oriented x3, extra-ocular muscles intact, sensation grossly intact.  HEENT: Normocephalic, atraumatic, pupils equal round reactive to light, neck supple, no masses, no lymphadenopathy, thyroid nonpalpable.  Skin: Warm and dry, skin or first web space of the right hand is somewhat excoriated, it is lichenified, and there is some cracking in the skin. No signs of bacterial superinfection. Cardiac: Regular rate and rhythm, no murmurs rubs or gallops, no lower extremity edema.  Respiratory: Clear to auscultation bilaterally. Not using accessory muscles, speaking in full sentences.  Impression and Recommendations:

## 2014-09-14 NOTE — Assessment & Plan Note (Signed)
Topical clobetasol twice a day. Return as needed.

## 2014-09-19 ENCOUNTER — Other Ambulatory Visit: Payer: Self-pay | Admitting: *Deleted

## 2014-09-19 MED ORDER — FLUCONAZOLE 150 MG PO TABS: 150.0000 mg | ORAL_TABLET | Freq: Once | ORAL | Status: DC

## 2014-09-19 NOTE — Telephone Encounter (Signed)
Med refill needed. Margette Fast, CMA

## 2014-09-19 NOTE — Telephone Encounter (Signed)
Medication pharmacy change by patient. Margette Fast, CMA

## 2014-10-06 ENCOUNTER — Telehealth: Payer: Self-pay

## 2014-10-06 MED ORDER — AZITHROMYCIN 250 MG PO TABS: ORAL_TABLET | ORAL | Status: DC

## 2014-10-06 NOTE — Telephone Encounter (Signed)
Kelsey Vaughan reports fever, chills, sweats, runny nose, sore throat, pressure in face and post nasal drainage for 1.5 weeks. Denies allergies to medication. 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

## 2014-10-06 NOTE — Telephone Encounter (Signed)
[place]!  Done!

## 2014-11-01 ENCOUNTER — Ambulatory Visit (INDEPENDENT_AMBULATORY_CARE_PROVIDER_SITE_OTHER): Payer: Self-pay | Admitting: Physician Assistant

## 2014-11-01 VITALS — BP 110/65 | HR 105 | Temp 98.3°F | Wt 175.0 lb

## 2014-11-01 DIAGNOSIS — N941 Dyspareunia: Secondary | ICD-10-CM

## 2014-11-01 DIAGNOSIS — R350 Frequency of micturition: Secondary | ICD-10-CM

## 2014-11-01 DIAGNOSIS — R1030 Lower abdominal pain, unspecified: Secondary | ICD-10-CM

## 2014-11-01 DIAGNOSIS — N898 Other specified noninflammatory disorders of vagina: Secondary | ICD-10-CM

## 2014-11-01 LAB — POCT URINALYSIS DIPSTICK
Bilirubin, UA: NEGATIVE
GLUCOSE UA: NEGATIVE
Ketones, UA: NEGATIVE
NITRITE UA: NEGATIVE
Protein, UA: 100
SPEC GRAV UA: 1.02
Urobilinogen, UA: 0.2
pH, UA: 6.5

## 2014-11-01 MED ORDER — CEFTRIAXONE SODIUM 1 G IJ SOLR
250.0000 mg | Freq: Once | INTRAMUSCULAR | Status: AC
  Administered 2014-11-01: 250 mg via INTRAMUSCULAR

## 2014-11-01 MED ORDER — CEFTRIAXONE SODIUM 250 MG IJ SOLR: 250.0000 mg | Freq: Once | INTRAMUSCULAR | Status: DC

## 2014-11-01 NOTE — Progress Notes (Signed)
   Subjective:    Patient ID: Kelsey Vaughan, female    DOB: 07/28/1976, 38 y.o.   MRN: [id number]  HPI Pt is a 38 yo female who presents to the clinic with 4 days of vaginal discharge. She describes as yellowish green and she has to use panty liner. This is not normal for her. No itching. Vaginal area feels "inflamed". She does have new sexual partner. She has been having sex and been painful. She denies any pain with urination but does have an urgency to go. No fever, chills, flank pain. She had UTI back in 10/06/14 that was treated and resolved. She has been spotting for last couple of days.    Review of Systems  All other systems reviewed and are negative.      Objective:   Physical Exam  Constitutional: She is oriented to person, place, and time. She appears well-developed and well-nourished.  HENT:  Head: Normocephalic and atraumatic.  Pulmonary/Chest:  NO CVA tenderness bilaterally.   Abdominal: Soft.  Tenderness over bilateral lower quadrant.   Neurological: She is alert and oriented to person, place, and time.  Skin: Skin is dry.  Psychiatric: She has a normal mood and affect. Her behavior is normal.          Assessment & Plan:  Vaginal discharge/abdominal pain/urinary frequency- .. Results for orders placed or performed in visit on 11/01/14  POCT Urinalysis Dipstick  Result Value Ref Range   Color, UA yellow    Clarity, UA clear    Glucose, UA neg    Bilirubin, UA neg    Ketones, UA neg    Spec Grav, UA 1.020    Blood, UA large    pH, UA 6.5    Protein, UA 100    Urobilinogen, UA 0.2    Nitrite, UA neg    Leukocytes, UA Trace    Will culture. Not convinced UTI blood could be from spotting and symptoms not 100 percent. Will wait for culture.  GC/chlamydia ordered. Very suspicious for PID. Treated today with rocephin 250IM injection pt would like to wait until results confirmed for additional oral supplementation.  Wet prep also ordered.  Will treat  accordingly to lab results.  Call with any worsening symptoms.
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
## 2014-11-02 LAB — WET PREP FOR TRICH, YEAST, CLUE
Clue Cells Wet Prep HPF POC: NONE SEEN
Trich, Wet Prep: NONE SEEN
WBC, Wet Prep HPF POC: NONE SEEN
Yeast Wet Prep HPF POC: NONE SEEN

## 2014-11-02 LAB — GC/CHLAMYDIA PROBE AMP, URINE
Chlamydia, Swab/Urine, PCR: NEGATIVE
GC PROBE AMP, URINE: NEGATIVE

## 2014-11-03 ENCOUNTER — Emergency Department (HOSPITAL_COMMUNITY)
Admission: EM | Admit: 2014-11-03 | Discharge: 2014-11-03 | Disposition: A | Discharge status: Home / Self Care | Payer: No Typology Code available for payment source | Attending: Emergency Medicine | Admitting: Emergency Medicine

## 2014-11-03 ENCOUNTER — Encounter (HOSPITAL_COMMUNITY): Payer: Self-pay | Admitting: Emergency Medicine

## 2014-11-03 DIAGNOSIS — Y92410 Unspecified street and highway as the place of occurrence of the external cause: Secondary | ICD-10-CM | POA: Diagnosis not present

## 2014-11-03 DIAGNOSIS — S39012A Strain of muscle, fascia and tendon of lower back, initial encounter: Secondary | ICD-10-CM | POA: Insufficient documentation

## 2014-11-03 DIAGNOSIS — S3992XA Unspecified injury of lower back, initial encounter: Secondary | ICD-10-CM | POA: Diagnosis present

## 2014-11-03 DIAGNOSIS — Y998 Other external cause status: Secondary | ICD-10-CM | POA: Diagnosis not present

## 2014-11-03 DIAGNOSIS — S161XXA Strain of muscle, fascia and tendon at neck level, initial encounter: Secondary | ICD-10-CM | POA: Diagnosis not present

## 2014-11-03 DIAGNOSIS — Z720 Tobacco use: Secondary | ICD-10-CM | POA: Diagnosis not present

## 2014-11-03 DIAGNOSIS — Z8742 Personal history of other diseases of the female genital tract: Secondary | ICD-10-CM | POA: Insufficient documentation

## 2014-11-03 DIAGNOSIS — R000 Tachycardia, unspecified: Secondary | ICD-10-CM | POA: Diagnosis not present

## 2014-11-03 DIAGNOSIS — Y9389 Activity, other specified: Secondary | ICD-10-CM | POA: Insufficient documentation

## 2014-11-03 LAB — URINE CULTURE
Colony Count: NO GROWTH
Organism ID, Bacteria: NO GROWTH

## 2014-11-03 MED ORDER — HYDROCODONE-ACETAMINOPHEN 5-325 MG PO TABS: 1.0000 | ORAL_TABLET | Freq: Four times a day (QID) | ORAL | Status: DC | PRN

## 2014-11-03 MED ORDER — CYCLOBENZAPRINE HCL 10 MG PO TABS: 10.0000 mg | ORAL_TABLET | Freq: Two times a day (BID) | ORAL | Status: DC | PRN
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
## 2014-11-04 ENCOUNTER — Ambulatory Visit (INDEPENDENT_AMBULATORY_CARE_PROVIDER_SITE_OTHER): Payer: Self-pay | Admitting: Sports Medicine

## 2014-11-04 ENCOUNTER — Encounter: Payer: Self-pay | Admitting: Sports Medicine

## 2014-11-04 VITALS — BP 116/71 | HR 106 | Wt 179.0 lb

## 2014-11-04 DIAGNOSIS — S161XXA Strain of muscle, fascia and tendon at neck level, initial encounter: Secondary | ICD-10-CM | POA: Insufficient documentation

## 2014-11-04 DIAGNOSIS — M62830 Muscle spasm of back: Secondary | ICD-10-CM

## 2014-11-04 MED ORDER — PREDNISONE 50 MG PO TABS: ORAL_TABLET | ORAL | Status: DC
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
## 2014-11-25 ENCOUNTER — Ambulatory Visit (INDEPENDENT_AMBULATORY_CARE_PROVIDER_SITE_OTHER): Payer: Self-pay | Admitting: Sports Medicine

## 2014-11-25 VITALS — BP 120/80 | HR 80 | Resp 16 | Ht 63.0 in | Wt 178.0 lb

## 2014-11-25 DIAGNOSIS — E6609 Other obesity due to excess calories: Secondary | ICD-10-CM | POA: Insufficient documentation

## 2014-11-25 DIAGNOSIS — E66811 Obesity, class 1: Secondary | ICD-10-CM | POA: Insufficient documentation

## 2014-11-25 DIAGNOSIS — Z6830 Body mass index (BMI) 30.0-30.9, adult: Secondary | ICD-10-CM

## 2014-11-25 DIAGNOSIS — E669 Obesity, unspecified: Secondary | ICD-10-CM

## 2014-11-25 MED ORDER — PHENTERMINE HCL 37.5 MG PO CAPS: ORAL_CAPSULE | ORAL | Status: DC

## 2014-11-25 NOTE — Assessment & Plan Note (Signed)
Starting phentermine, return monthly for weight checks and refills. 

## 2014-11-25 NOTE — Progress Notes (Signed)
  Subjective:    CC: Weight loss help  HPI: Taurus comes in to discuss weight loss, she has tried multiple diets but has difficulty sending off a voracious appetite, she is also tried exercise. She is amenable to trying pharmacologic weight loss intervention.  Past medical history, Surgical history, Family history not pertinant except as noted below, Social history, Allergies, and medications have been entered into the medical record, reviewed, and no changes needed.   Review of Systems: No fevers, chills, night sweats, weight loss, chest pain, or shortness of breath.   Objective:    General: Well Developed, well nourished, and in no acute distress.  Neuro: Alert and oriented x3, extra-ocular muscles intact, sensation grossly intact.  HEENT: Normocephalic, atraumatic, pupils equal round reactive to light, neck supple, no masses, no lymphadenopathy, thyroid nonpalpable.  Skin: Warm and dry, no rashes. Cardiac: Regular rate and rhythm, no murmurs rubs or gallops, no lower extremity edema.  Respiratory: Clear to auscultation bilaterally. Not using accessory muscles, speaking in full sentences.  Impression and Recommendations:

## 2014-12-12 ENCOUNTER — Other Ambulatory Visit: Payer: Self-pay

## 2014-12-12 MED ORDER — DESOGESTREL-ETHINYL ESTRADIOL 0.15-30 MG-MCG PO TABS: 1.0000 | ORAL_TABLET | Freq: Every day | ORAL | Status: DC

## 2014-12-28 ENCOUNTER — Telehealth: Payer: Self-pay | Admitting: *Deleted

## 2014-12-28 DIAGNOSIS — E669 Obesity, unspecified: Secondary | ICD-10-CM

## 2014-12-28 MED ORDER — PHENTERMINE HCL 37.5 MG PO CAPS: ORAL_CAPSULE | ORAL | Status: DC

## 2014-12-28 NOTE — Telephone Encounter (Signed)
Kelsey Vaughan has lost 13lbs. Her current weight is 165 lbs.

## 2015-01-10 ENCOUNTER — Ambulatory Visit (INDEPENDENT_AMBULATORY_CARE_PROVIDER_SITE_OTHER): Payer: Self-pay | Admitting: Sports Medicine

## 2015-01-10 ENCOUNTER — Encounter: Payer: Self-pay | Admitting: Sports Medicine

## 2015-01-10 ENCOUNTER — Ambulatory Visit (INDEPENDENT_AMBULATORY_CARE_PROVIDER_SITE_OTHER): Payer: Self-pay

## 2015-01-10 DIAGNOSIS — S161XXA Strain of muscle, fascia and tendon at neck level, initial encounter: Secondary | ICD-10-CM

## 2015-01-10 DIAGNOSIS — M542 Cervicalgia: Secondary | ICD-10-CM

## 2015-01-10 NOTE — Progress Notes (Signed)
  Subjective:    CC: Neck pain  HPI: Kelsey Vaughan this pleasant 39 year old female slipped and fell, he had immediate pain in the right side of her neck radiating over the trapezius but not past the shoulder and no numbness or tingling in the hands, pain is moderate, persistent, worse with turning the head to the right. No bowel or bladder dysfunction, no constitutional symptoms, no lower extremity symptoms.  Past medical history, Surgical history, Family history not pertinant except as noted below, Social history, Allergies, and medications have been entered into the medical record, reviewed, and no changes needed.   Review of Systems: No fevers, chills, night sweats, weight loss, chest pain, or shortness of breath.   Objective:    General: Well Developed, well nourished, and in no acute distress.  Neuro: Alert and oriented x3, extra-ocular muscles intact, sensation grossly intact.  HEENT: Normocephalic, atraumatic, pupils equal round reactive to light, neck supple, no masses, no lymphadenopathy, thyroid nonpalpable.  Skin: Warm and dry, no rashes. Cardiac: Regular rate and rhythm, no murmurs rubs or gallops, no lower extremity edema.  Respiratory: Clear to auscultation bilaterally. Not using accessory muscles, speaking in full sentences. Neck: Negative spurling's Full neck range of motion Grip strength and sensation normal in bilateral hands Strength good C4 to T1 distribution No sensory change to C4 to T1 Reflexes normal Tender over the trapezius.  X-ray show straightening of the normal cervical lordosis.  Impression and Recommendations:

## 2015-01-10 NOTE — Assessment & Plan Note (Signed)
Slipped in the ice, recurrent strain. X-rays are negative, continue as needed Flexeril and meloxicam, soft cervical collar.

## 2015-01-27 ENCOUNTER — Other Ambulatory Visit: Payer: Self-pay | Admitting: *Deleted

## 2015-01-27 DIAGNOSIS — E669 Obesity, unspecified: Secondary | ICD-10-CM

## 2015-01-27 MED ORDER — PHENTERMINE HCL 37.5 MG PO CAPS: ORAL_CAPSULE | ORAL | Status: DC

## 2015-05-05 ENCOUNTER — Other Ambulatory Visit: Payer: Self-pay

## 2015-05-05 MED ORDER — FEXOFENADINE HCL 180 MG PO TABS: 180.0000 mg | ORAL_TABLET | Freq: Every day | ORAL | Status: DC

## 2015-06-14 ENCOUNTER — Other Ambulatory Visit: Payer: Self-pay

## 2015-06-14 MED ORDER — DESOGESTREL-ETHINYL ESTRADIOL 0.15-30 MG-MCG PO TABS: 1.0000 | ORAL_TABLET | Freq: Every day | ORAL | Status: DC

## 2015-06-16 ENCOUNTER — Other Ambulatory Visit: Payer: Self-pay

## 2015-06-16 MED ORDER — DESOGESTREL-ETHINYL ESTRADIOL 0.15-30 MG-MCG PO TABS: 1.0000 | ORAL_TABLET | Freq: Every day | ORAL | Status: DC

## 2015-06-23 ENCOUNTER — Ambulatory Visit (INDEPENDENT_AMBULATORY_CARE_PROVIDER_SITE_OTHER): Payer: Self-pay | Admitting: Sports Medicine

## 2015-06-23 ENCOUNTER — Other Ambulatory Visit: Payer: Self-pay | Admitting: *Deleted

## 2015-06-23 VITALS — BP 105/66 | HR 90 | Wt 161.0 lb

## 2015-06-23 DIAGNOSIS — E669 Obesity, unspecified: Secondary | ICD-10-CM

## 2015-06-23 MED ORDER — PHENTERMINE HCL 37.5 MG PO CAPS: ORAL_CAPSULE | ORAL | Status: DC

## 2015-06-23 NOTE — Progress Notes (Signed)
Patient was seen today for a refill on her Phentermine. Pt states she wants to start taking this Rx again as well as begin some exercise. Pt has been monitoring her diet. Pt is down some from her last visit, Rx will be printed and given to Provider for review. No further questions/concerns at this time.

## 2015-06-23 NOTE — Assessment & Plan Note (Signed)
[place] off the bandwagon slightly, we are going to restart phentermine, return in one month for a weight check and refills.

## 2015-08-28 ENCOUNTER — Encounter: Payer: Self-pay | Admitting: Family Medicine

## 2015-08-28 ENCOUNTER — Ambulatory Visit (INDEPENDENT_AMBULATORY_CARE_PROVIDER_SITE_OTHER): Payer: Self-pay | Admitting: Family Medicine

## 2015-08-28 VITALS — BP 112/74 | HR 83 | Temp 98.5°F | Wt 157.0 lb

## 2015-08-28 DIAGNOSIS — R591 Generalized enlarged lymph nodes: Secondary | ICD-10-CM

## 2015-08-28 MED ORDER — CEFDINIR 300 MG PO CAPS: 300.0000 mg | ORAL_CAPSULE | Freq: Two times a day (BID) | ORAL | Status: DC

## 2015-08-28 MED ORDER — AMOXICILLIN 500 MG PO CAPS: 500.0000 mg | ORAL_CAPSULE | Freq: Three times a day (TID) | ORAL | Status: DC

## 2015-08-28 MED ORDER — FLUCONAZOLE 150 MG PO TABS: ORAL_TABLET | ORAL | Status: DC

## 2015-08-28 NOTE — Progress Notes (Signed)
Unable to afford omnicef, patient requesting $4 list options, Rx for amoxicillin sent.

## 2015-08-28 NOTE — Progress Notes (Signed)
CC: Kelsey Vaughan is a 39 y.o. female is here for Sore Throat   Subjective: HPI:   right sided neck pain with sore throat worse first thing in the morning present since Saturday. It came on suddenly and is not  Improving with Tylenol. Pain is nonradiating.  Having difficulty swallowing solids due to the pain. States she feels well otherwise. Denies fevers, chills, cough, wheezing, shortness of breath, or choking. No rash or headache. No nasal congestion or  Postnasal drip   Review Of Systems Outlined In HPI  Past Medical History  Diagnosis Date  . Foreign body in foot, left 07/2013  . Irregular menses     due to oral contraceptive    Past Surgical History  Procedure Laterality Date  . Cesarean section    . Shoulder arthroscopy Left   . Foreign body removal Left 08/05/2013    Procedure: LEFT FOOT FOREIGN BODY REMOVAL ADULT;  Surgeon: Wylene Simmer, MD;  Location: [facility];  Service: Orthopedics;  Laterality: Left;   No family history on file.  Social History   Social History  . Marital Status: Unknown    Spouse Name: N/A  . Number of Children: N/A  . Years of Education: N/A   Occupational History  . Not on file.   Social History Main Topics  . Smoking status: Current Every Day Smoker -- 2 years    Types: Cigarettes  . Smokeless tobacco: Never Used     Comment: 4-5 cig./day  . Alcohol Use: Yes     Comment: occasionally  . Drug Use: No  . Sexual Activity: Not on file   Other Topics Concern  . Not on file   Social History Narrative     Objective: BP 112/74 mmHg  Pulse 83  Temp(Src) 98.5 F (36.9 C) (Oral)  Wt 157 lb (71.215 kg)  General: Alert and Oriented, No Acute Distress HEENT: Pupils equal, round, reactive to light. Conjunctivae clear.  External ears unremarkable, canals clear with intact TMs with appropriate landmarks.  Middle ear appears open without effusion. Pink inferior turbinates.  Moist mucous membranes,  With moderate erythema on  the right tonsillar pillar.  Uvula is midline.  3 slightly tender anterior chain lymph nodes on the right. Lungs: Clear to auscultation bilaterally, no wheezing/ronchi/rales.  Comfortable work of breathing. Good air movement. Extremities: No peripheral edema.  Strong peripheral pulses.  Mental Status: No depression, anxiety, nor agitation. Skin: Warm and dry.  Assessment & Plan: Satya was seen today for sore throat.  Diagnoses and all orders for this visit:  Lymphadenopathy -     cefdinir (OMNICEF) 300 MG capsule; Take 1 capsule (300 mg total) by mouth 2 (two) times daily. -     fluconazole (DIFLUCAN) 150 MG tablet; Take one tab, may take second tab if no improvement after 72 hours.    lymphadenopathy of the neck, rapid strep is negative will provide Omnicef for  Full bacterial coverage. Encourage start ibuprofen 400-800 milligrams 3 times a day as needed.  Return if symptoms worsen or fail to improve.

## 2015-08-28 NOTE — Addendum Note (Signed)
Addended by: Marcial Pacas on: 08/28/2015 12:09 PM   Modules accepted: Orders, Medications

## 2015-10-02 ENCOUNTER — Ambulatory Visit (INDEPENDENT_AMBULATORY_CARE_PROVIDER_SITE_OTHER): Payer: Self-pay | Admitting: Osteopathic Medicine

## 2015-10-02 VITALS — BP 114/63 | HR 109 | Temp 98.0°F | Ht 63.0 in | Wt 158.0 lb

## 2015-10-02 DIAGNOSIS — Z887 Allergy status to serum and vaccine status: Secondary | ICD-10-CM

## 2015-10-02 NOTE — Progress Notes (Signed)
HPI: Kelsey Vaughan is a 39 y.o. female who presents to [facility]  today for chief complaint of:  Chief Complaint  Patient presents with  . Rash    at flu injection site-left arm    . Location: L upper arm . Quality: red, itching . Severity: mild to moderate . Duration: 3 days . Timing: started right after receiving the flu shot at work . Context: has happened past 3 years - same skin reaction to flu vaccine . Assoc signs/symptoms: no SOB/swelling lips/mouth   Past medical, social and family history reviewed: Past Medical History  Diagnosis Date  . Foreign body in foot, left 07/2013  . Irregular menses     due to oral contraceptive   Past Surgical History  Procedure Laterality Date  . Cesarean section    . Shoulder arthroscopy Left   . Foreign body removal Left 08/05/2013    Procedure: LEFT FOOT FOREIGN BODY REMOVAL ADULT;  Surgeon: Wylene Simmer, MD;  Location: [facility];  Service: Orthopedics;  Laterality: Left;   Social History  Substance Use Topics  . Smoking status: Current Every Day Smoker -- 2 years    Types: Cigarettes  . Smokeless tobacco: Never Used     Comment: 4-5 cig./day  . Alcohol Use: Yes     Comment: occasionally   No family history on file.  Current Outpatient Prescriptions  Medication Sig Dispense Refill  . amoxicillin (AMOXIL) 500 MG capsule Take 1 capsule (500 mg total) by mouth 3 (three) times daily. 30 capsule 0  . desogestrel-ethinyl estradiol (EMOQUETTE) 0.15-30 MG-MCG tablet Take 1 tablet by mouth daily. 3 Package 11  . meloxicam (MOBIC) 15 MG tablet Take 1 tablet (15 mg total) by mouth daily. 90 tablet 3   No current facility-administered medications for this visit.   No Known Allergies    Review of Systems: CONSTITUTIONAL: Neg fever/chills, no unintentional weight changes HEAD/EYES/EARS/NOSE/THROAT: No headache/vision change or hearing change CARDIAC: No chest  pain/pressure/palpitations, no orthopnea RESPIRATORY: No cough/shortness of breath/wheeze MUSCULOSKELETAL: No myalgia/arthralgia SKIN: No rash/wounds/concerning lesions other than redness/itching arm as per HPI HEM/ONC: No easy bruising/bleeding, no abnormal lymph node NEUROLOGIC: No weakness/dizzines/slurred speech    Exam:  BP 114/63 mmHg  Pulse 109  Temp(Src) 98 F (36.7 C) (Oral)  Ht 5\' 3"  (1.6 m)  Wt 158 lb (71.668 kg)  BMI 28.00 kg/m2 Constitutional: VSS, see above. General Appearance: alert, well-developed, well-nourished, NAD Eyes: Normal lids and conjunctive, non-icteric sclera,  Ears, Nose, Mouth, Throat: MMM, posterior pharynx without erythema/exudate Neck: No masses, trachea midline. No thyroid enlargement/tenderness/mass appreciated Neurological: No cranial nerve deficit on limited exam. Motor and sensation intact and symmetric Skin: (+) erythema in diffuse patch on L arm x 2, lateral and medial, below injection site for flu vaccine    No results found for this or any previous visit (from the past 72 hour(s)).    ASSESSMENT/PLAN:  Allergy to influenza vaccine - recommend avoid flu vaccine, however is reassuring there is no history of anaphylaxis. Risks vs benefits of flu vaccine reviewed.    Advised symptom relief with benadryl or steroid cream, pt declines steroid pak prescription or other prescribed meds.

## 2015-10-03 ENCOUNTER — Telehealth: Payer: Self-pay | Admitting: Family Medicine

## 2015-10-03 ENCOUNTER — Ambulatory Visit (INDEPENDENT_AMBULATORY_CARE_PROVIDER_SITE_OTHER): Payer: Self-pay

## 2015-10-03 DIAGNOSIS — R591 Generalized enlarged lymph nodes: Secondary | ICD-10-CM

## 2015-10-03 DIAGNOSIS — R590 Localized enlarged lymph nodes: Secondary | ICD-10-CM

## 2015-10-03 NOTE — Telephone Encounter (Signed)
Lymphadenopathy is  Not improving, ordered US

## 2015-10-04 ENCOUNTER — Telehealth: Payer: Self-pay | Admitting: Family Medicine

## 2015-10-04 MED ORDER — AZITHROMYCIN 250 MG PO TABS: ORAL_TABLET | ORAL | Status: AC

## 2015-10-04 NOTE — Telephone Encounter (Signed)
Seth Bake, Will you please let patient know that her ultrasound confirms that her swollen masses are lymph nodes and they appear to be benign and only swollen due to an infection or allergies.  I'd recommend she try another antibiotic, azithromycin, that I sent to her [facility].

## 2015-10-04 NOTE — Telephone Encounter (Signed)
Pt.notified

## 2015-10-12 ENCOUNTER — Other Ambulatory Visit: Payer: Self-pay | Admitting: Family Medicine

## 2015-10-12 MED ORDER — AZITHROMYCIN 250 MG PO TABS: ORAL_TABLET | ORAL | Status: DC

## 2015-10-31 ENCOUNTER — Other Ambulatory Visit: Payer: Self-pay | Admitting: Sports Medicine

## 2015-10-31 ENCOUNTER — Ambulatory Visit (INDEPENDENT_AMBULATORY_CARE_PROVIDER_SITE_OTHER): Payer: Self-pay | Admitting: Osteopathic Medicine

## 2015-10-31 VITALS — BP 110/62 | HR 95 | Temp 98.0°F | Wt 164.0 lb

## 2015-10-31 DIAGNOSIS — R350 Frequency of micturition: Secondary | ICD-10-CM

## 2015-10-31 DIAGNOSIS — N3001 Acute cystitis with hematuria: Secondary | ICD-10-CM

## 2015-10-31 LAB — POCT URINALYSIS DIPSTICK
BILIRUBIN UA: NEGATIVE
GLUCOSE UA: NEGATIVE
Nitrite, UA: NEGATIVE
Protein, UA: 100
SPEC GRAV UA: 1.025
UROBILINOGEN UA: 0.2
pH, UA: 7.5

## 2015-10-31 MED ORDER — SULFAMETHOXAZOLE-TRIMETHOPRIM 800-160 MG PO TABS
1.0000 | ORAL_TABLET | Freq: Two times a day (BID) | ORAL | Status: DC
Start: 2015-10-31 — End: 2015-11-15

## 2015-10-31 NOTE — Progress Notes (Signed)
Patient came into clinic today complaining of urinary frequency and some tingling with urination. Pt was able to provide a urine sample today. Results will be given to Provider.

## 2015-10-31 NOTE — Progress Notes (Signed)
Chief Complaint  Patient presents with  . Urinary Frequency   HPI Onset dysuria and frequency few days ago, hx recurrent hematuria, no fever/chills.  PHYSICAL EXAM Filed Vitals:   10/31/15 1535  BP: 110/62  Pulse: 95  Temp: 98 F (36.7 C)  General: Alert, NAD, A&Ox2    ASSESSMENT/PLAN  Urine frequency - Plan: POCT Urinalysis Dipstick, Urine Culture  Acute cystitis with hematuria - Plan: sulfamethoxazole-trimethoprim (BACTRIM DS) 800-160 MG tablet

## 2015-11-02 LAB — URINE CULTURE

## 2015-11-15 ENCOUNTER — Encounter: Payer: Self-pay | Admitting: Family Medicine

## 2015-11-15 ENCOUNTER — Ambulatory Visit (INDEPENDENT_AMBULATORY_CARE_PROVIDER_SITE_OTHER): Payer: Self-pay | Admitting: Family Medicine

## 2015-11-15 VITALS — BP 101/61 | HR 92 | Wt 162.0 lb

## 2015-11-15 DIAGNOSIS — E669 Obesity, unspecified: Secondary | ICD-10-CM

## 2015-11-15 MED ORDER — PHENTERMINE HCL 37.5 MG PO CAPS: 37.5000 mg | ORAL_CAPSULE | ORAL | Status: DC

## 2015-11-15 NOTE — Progress Notes (Signed)
CC: Kelsey Vaughan is a 39 y.o. female is here for Weight Gain   Subjective: HPI:  Complains of difficulty losing weight. She is having trouble with cravings for bread and soda. She was able to lose over 20 pounds with use of phentermine and exercising last year. She would like to know if she can restart phentermine. She denies any known side effects from the medication. She denies any anxiety or sleep disturbance.  Review Of Systems Outlined In HPI  Past Medical History  Diagnosis Date  . Foreign body in foot, left 07/2013  . Irregular menses     due to oral contraceptive    Past Surgical History  Procedure Laterality Date  . Cesarean section    . Shoulder arthroscopy Left   . Foreign body removal Left 08/05/2013    Procedure: LEFT FOOT FOREIGN BODY REMOVAL ADULT;  Surgeon: Wylene Simmer, MD;  Location: [facility];  Service: Orthopedics;  Laterality: Left;   No family history on file.  Social History   Social History  . Marital Status: Unknown    Spouse Name: N/A  . Number of Children: N/A  . Years of Education: N/A   Occupational History  . Not on file.   Social History Main Topics  . Smoking status: Current Every Day Smoker -- 2 years    Types: Cigarettes  . Smokeless tobacco: Never Used     Comment: 4-5 cig./day  . Alcohol Use: Yes     Comment: occasionally  . Drug Use: No  . Sexual Activity: Not on file   Other Topics Concern  . Not on file   Social History Narrative     Objective: BP 101/61 mmHg  Pulse 92  Wt 162 lb (73.483 kg)  Vital signs reviewed. General: Alert and Oriented, No Acute Distress HEENT: Pupils equal, round, reactive to light. Conjunctivae clear.  External ears unremarkable.  Moist mucous membranes. Lungs: Clear and comfortable work of breathing, speaking in full sentences without accessory muscle use. Cardiac: Regular rate and rhythm.  Neuro: CN II-XII grossly intact, gait normal. Extremities: No peripheral edema.   Strong peripheral pulses.  Mental Status: No depression, anxiety, nor agitation. Logical though process. Skin: Warm and dry.  Assessment & Plan: [name] was seen today for weight gain.  Diagnoses and all orders for this visit:  Obesity -     phentermine 37.5 MG capsule; Take 1 capsule (37.5 mg total) by mouth every morning.   Discussed the benefits of exercising most days of the week along with cutting out simple sugars in the diet. Advised to try diet sodas instead of sodas with fructose corn syrup. Restarting phentermine. Goal 10-15 pounds weight loss in the next 3 months.   Return in about 4 weeks (around 12/13/2015).

## 2015-12-12 ENCOUNTER — Other Ambulatory Visit: Payer: Self-pay | Admitting: Osteopathic Medicine

## 2015-12-12 DIAGNOSIS — D72829 Elevated white blood cell count, unspecified: Secondary | ICD-10-CM

## 2015-12-12 DIAGNOSIS — R319 Hematuria, unspecified: Secondary | ICD-10-CM

## 2015-12-12 DIAGNOSIS — N3001 Acute cystitis with hematuria: Secondary | ICD-10-CM

## 2015-12-12 MED ORDER — SULFAMETHOXAZOLE-TRIMETHOPRIM 800-160 MG PO TABS: 1.0000 | ORAL_TABLET | Freq: Two times a day (BID) | ORAL | Status: DC

## 2015-12-12 NOTE — Progress Notes (Signed)
(+)   Leukocytosis and hemtaturia in dipstick, discussed option for ppx abx or urology referral, last cx multiple species, will get clean catch today and send for culture and further mgt/wu pending these results.

## 2016-01-16 MED FILL — valACYclovir HCL 1 GM TABS: 1 | 7 days supply | Qty: 14 | Fill #1

## 2016-01-22 ENCOUNTER — Telehealth: Payer: Self-pay | Admitting: Family Medicine

## 2016-01-22 DIAGNOSIS — R319 Hematuria, unspecified: Secondary | ICD-10-CM

## 2016-01-22 NOTE — Telephone Encounter (Signed)
referal request

## 2016-02-19 MED FILL — valACYclovir HCL 1 GM TABS: 1 | 7 days supply | Qty: 14 | Fill #2

## 2016-02-29 ENCOUNTER — Ambulatory Visit (INDEPENDENT_AMBULATORY_CARE_PROVIDER_SITE_OTHER): Payer: Self-pay

## 2016-02-29 ENCOUNTER — Encounter: Payer: Self-pay | Admitting: Family Medicine

## 2016-02-29 ENCOUNTER — Other Ambulatory Visit: Payer: Self-pay | Admitting: Urology

## 2016-02-29 DIAGNOSIS — R3129 Other microscopic hematuria: Secondary | ICD-10-CM | POA: Insufficient documentation

## 2016-02-29 DIAGNOSIS — R319 Hematuria, unspecified: Secondary | ICD-10-CM

## 2016-02-29 MED ORDER — IOHEXOL 350 MG/ML SOLN
150.0000 mL | Freq: Once | INTRAVENOUS | Status: AC | PRN
  Administered 2016-02-29: 150 mL via INTRAVENOUS

## 2016-03-07 ENCOUNTER — Ambulatory Visit (INDEPENDENT_AMBULATORY_CARE_PROVIDER_SITE_OTHER): Payer: Self-pay | Admitting: Family Medicine

## 2016-03-07 ENCOUNTER — Encounter: Payer: Self-pay | Admitting: Family Medicine

## 2016-03-07 VITALS — BP 103/67 | HR 105

## 2016-03-07 DIAGNOSIS — I479 Paroxysmal tachycardia, unspecified: Secondary | ICD-10-CM | POA: Insufficient documentation

## 2016-03-07 DIAGNOSIS — I4891 Unspecified atrial fibrillation: Secondary | ICD-10-CM

## 2016-03-07 LAB — COMPLETE METABOLIC PANEL WITH GFR
ALBUMIN: 3.5 g/dL — AB (ref 3.6–5.1)
ALK PHOS: 54 U/L (ref 33–115)
ALT: 17 U/L (ref 6–29)
AST: 13 U/L (ref 10–30)
BILIRUBIN TOTAL: 0.4 mg/dL (ref 0.2–1.2)
BUN: 11 mg/dL (ref 7–25)
CALCIUM: 8.9 mg/dL (ref 8.6–10.2)
CO2: 23 mmol/L (ref 20–31)
CREATININE: 0.78 mg/dL (ref 0.50–1.10)
Chloride: 106 mmol/L (ref 98–110)
GFR, Est African American: >89 mL/min (ref >=60)
GFR, Est Non African American: >89 mL/min (ref >=60)
Glucose, Bld: 91 mg/dL (ref 65–99)
Potassium: 3.8 mmol/L (ref 3.5–5.3)
Sodium: 140 mmol/L (ref 135–146)
TOTAL PROTEIN: 6.9 g/dL (ref 6.1–8.1)

## 2016-03-07 LAB — TSH: TSH: 1.19 m[IU]/L

## 2016-03-07 MED ORDER — ASPIRIN EC 325 MG PO TBEC: 325.0000 mg | DELAYED_RELEASE_TABLET | Freq: Every day | ORAL | Status: DC

## 2016-03-07 MED ORDER — METOPROLOL SUCCINATE ER 25 MG PO TB24: 25.0000 mg | ORAL_TABLET | Freq: Every day | ORAL | Status: DC

## 2016-03-07 MED FILL — METOPROLOL SUCC ER 25 MG TA: 25 | 30 days supply | Qty: 30 | Fill #0

## 2016-03-07 MED FILL — ASPIRIN EC 325 MG TABLET: 325 | 100 days supply | Qty: 100 | Fill #0

## 2016-03-07 NOTE — Addendum Note (Signed)
Addended by: Huel Cote on: 03/07/2016 02:05 PM   Modules accepted: Orders

## 2016-03-07 NOTE — Progress Notes (Signed)
CC: Kelsey Vaughan is a 40 y.o. female is here for Palpitations   Subjective: HPI:  Yesterday evening she noticed palpitations and occasional forceful heartbeat but no chest pain. She continues to deny chest pain but noticed when she was walking into the office today that the faster she walks the more noticeable the palpitations were. She denies any motor or sensory disturbances nor shortness of breath. Symptoms are mild in severity. No interventions as of yet. She reports palpitations in the past but recent episode seems more noticeable. Other than above nothing else seems to make symptoms better or worse. She is not taking any stimulants and is not taking any medication other than birth control pills. She denies lightheadedness or edema   Review Of Systems Outlined In HPI  Past Medical History  Diagnosis Date  . Foreign body in foot, left 07/2013  . Irregular menses     due to oral contraceptive    Past Surgical History  Procedure Laterality Date  . Cesarean section    . Shoulder arthroscopy Left   . Foreign body removal Left 08/05/2013    Procedure: LEFT FOOT FOREIGN BODY REMOVAL ADULT;  Surgeon: Wylene Simmer, MD;  Location: [facility];  Service: Orthopedics;  Laterality: Left;   No family history on file.  Social History   Social History  . Marital Status: Unknown    Spouse Name: N/A  . Number of Children: N/A  . Years of Education: N/A   Occupational History  . Not on file.   Social History Main Topics  . Smoking status: Current Every Day Smoker -- 2 years    Types: Cigarettes  . Smokeless tobacco: Never Used     Comment: 4-5 cig./day  . Alcohol Use: Yes     Comment: occasionally  . Drug Use: No  . Sexual Activity: Not on file   Other Topics Concern  . Not on file   Social History Narrative     Objective: BP 103/67 mmHg  Pulse 105  SpO2 100%  General: Alert and Oriented, No Acute Distress HEENT: Pupils equal, round, reactive to light.  Conjunctivae clear. Moist mucous membranes Lungs: Clear to auscultation bilaterally, no wheezing/ronchi/rales.  Comfortable work of breathing. Good air movement. Cardiac: Irregularly irregular heartbeat at approximately 110-120 bpm. No murmur Extremities: No peripheral edema.  Strong peripheral pulses.  Mental Status: No depression, anxiety, nor agitation. Skin: Warm and dry. Initial EKG shows atrial fibrillation with a rapid ventricular response at a ventricular rate of 133.  Assessment & Plan: Kelsey Vaughan was seen today for palpitations.  Diagnoses and all orders for this visit:  Atrial fibrillation, unspecified type ([place]) -     Ambulatory referral to Cardiology -     COMPLETE METABOLIC PANEL WITH GFR -     TSH -     Troponin I -     metoprolol succinate (TOPROL-XL) 25 MG 24 hr tablet; Take 1 tablet (25 mg total) by mouth daily.  Other orders -     aspirin EC 325 MG tablet; Take 1 tablet (325 mg total) by mouth daily.   Atrial fibrillation: Chads score of 0, she was advised to start a full dose aspirin on a daily basis and her first dose was given to her here in the office. She was stable to travel to a local pharmacy to pick up metoprolol which she took this morning and by the afternoon and EKG revealed that she was back in their normal sinus rhythm. She's tolerating metoprolol  will continue on this medication with a consultation with cardiology that's been scheduled for Monday. I've asked her to notify me if she gets any return of her palpitations/atrial fibrillation. Based on blood pressure and heart rate tomorrow we may need to consider increasing her dose to 50 mg daily.  Return if symptoms worsen or fail to improve.

## 2016-03-08 LAB — TROPONIN I: Troponin I: <0.01 ng/mL (ref <=0.05)

## 2016-04-12 ENCOUNTER — Encounter: Payer: Self-pay | Admitting: Family Medicine

## 2016-04-26 ENCOUNTER — Telehealth: Payer: Self-pay | Admitting: Family Medicine

## 2016-04-26 MED ORDER — TOPIRAMATE 100 MG PO TABS: ORAL_TABLET | ORAL | Status: DC

## 2016-04-26 MED FILL — TOPIRAMATE 100 MG TABLET: 100 | 30 days supply | Qty: 60 | Fill #0

## 2016-04-26 NOTE — Telephone Encounter (Signed)
Patient looking to reduce appetite

## 2016-05-20 ENCOUNTER — Ambulatory Visit (INDEPENDENT_AMBULATORY_CARE_PROVIDER_SITE_OTHER): Payer: Self-pay | Admitting: Family Medicine

## 2016-05-20 ENCOUNTER — Encounter: Payer: Self-pay | Admitting: Family Medicine

## 2016-05-20 VITALS — BP 109/67 | HR 88 | Wt 170.0 lb

## 2016-05-20 DIAGNOSIS — E669 Obesity, unspecified: Secondary | ICD-10-CM

## 2016-05-20 DIAGNOSIS — I4891 Unspecified atrial fibrillation: Secondary | ICD-10-CM

## 2016-05-20 MED ORDER — PHENTERMINE HCL 37.5 MG PO TABS
37.5000 mg | ORAL_TABLET | Freq: Every day | ORAL | Status: DC
Start: 2016-05-20 — End: 2016-05-20

## 2016-05-20 MED ORDER — PHENTERMINE HCL 37.5 MG PO CAPS: 37.5000 mg | ORAL_CAPSULE | ORAL | Status: DC

## 2016-05-20 MED FILL — PHENTERMINE 37.5 MG CAPSULE: 37.5 | 30 days supply | Qty: 30 | Fill #0

## 2016-05-20 NOTE — Progress Notes (Signed)
CC: Kelsey Vaughan is a 40 y.o. female is here for Medication Management   Subjective: HPI:  Follow-up obesity: She doesn't feel like Topamax was helping suppress appetite. If anything it was causing her fatigue. She denies any other symptoms or side effects from this medication. Once he takes something else to help with appetite suppression. She is already exercising as much she can has cut out fructose in her diet.  Follow-up atrial fibrillation: He is taking metoprolol on a daily basis with 100% percent compliance. No outside blood pressures or pulse . Currently report she denies any irregular heartbeat, chest discomfort or any motor or sensory disturbances.   Review Of Systems Outlined In HPI  Past Medical History  Diagnosis Date  . Foreign body in foot, left 07/2013  . Irregular menses     due to oral contraceptive    Past Surgical History  Procedure Laterality Date  . Cesarean section    . Shoulder arthroscopy Left   . Foreign body removal Left 08/05/2013    Procedure: LEFT FOOT FOREIGN BODY REMOVAL ADULT;  Surgeon: Wylene Simmer, MD;  Location: [facility];  Service: Orthopedics;  Laterality: Left;   No family history on file.  Social History   Social History  . Marital Status: Unknown    Spouse Name: N/A  . Number of Children: N/A  . Years of Education: N/A   Occupational History  . Not on file.   Social History Main Topics  . Smoking status: Current Every Day Smoker -- 2 years    Types: Cigarettes  . Smokeless tobacco: Never Used     Comment: 4-5 cig./day  . Alcohol Use: Yes     Comment: occasionally  . Drug Use: No  . Sexual Activity: Not on file   Other Topics Concern  . Not on file   Social History Narrative     Objective: BP 109/67 mmHg  Pulse 88  Wt 170 lb (77.111 kg)  Vital signs reviewed. General: Alert and Oriented, No Acute Distress HEENT: Pupils equal, round, reactive to light. Conjunctivae clear.  External ears  unremarkable.  Moist mucous membranes. Lungs: Clear and comfortable work of breathing, speaking in full sentences without accessory muscle use. Cardiac: Regular rate and rhythm.  Neuro: CN II-XII grossly intact, gait normal. Extremities: No peripheral edema.  Strong peripheral pulses.  Mental Status: No depression, anxiety, nor agitation. Logical though process. Skin: Warm and dry. Assessment & Plan: Kelsey Vaughan was seen today for medication management.  Diagnoses and all orders for this visit:  Atrial fibrillation, unspecified type ([place])  Obesity  Other orders -     Discontinue: phentermine (ADIPEX-P) 37.5 MG tablet; Take 1 tablet (37.5 mg total) by mouth daily before breakfast. -     phentermine 37.5 MG capsule; Take 1 capsule (37.5 mg total) by mouth every morning.   Obesity: Discussed the option of phentermine with the warning that this can cause a return of her irregular heartbeat or new onset hypertension. We are both optimistic that since she is already on metoprolol the chances of this will be reviewed she is agreeable to having her blood pressure and pulse checked for the next 3 days to make sure she is tolerating this medication. Atrial fibrillation: Currently controlled continue metoprolol will keep a close eye on this over the next few days  25 minutes spent face-to-face during visit today of which at least 50% was counseling or coordinating care regarding: 1. Atrial fibrillation, unspecified type ([place])   2.  Obesity       No Follow-up on file.

## 2016-05-21 ENCOUNTER — Telehealth: Payer: Self-pay | Admitting: Family Medicine

## 2016-05-21 NOTE — Telephone Encounter (Signed)
Sometime this morning can you please check Kelsey Vaughan's BP and pulse since she just started phentermine?

## 2016-05-21 NOTE — Telephone Encounter (Signed)
05/21/16 vitals  118/81 BP 88 pulse

## 2016-06-24 ENCOUNTER — Ambulatory Visit (INDEPENDENT_AMBULATORY_CARE_PROVIDER_SITE_OTHER): Payer: Self-pay | Admitting: Family Medicine

## 2016-06-24 VITALS — BP 107/68 | HR 102 | Wt 161.0 lb

## 2016-06-24 DIAGNOSIS — R635 Abnormal weight gain: Secondary | ICD-10-CM

## 2016-06-24 MED ORDER — PHENTERMINE HCL 37.5 MG PO CAPS: 37.5000 mg | ORAL_CAPSULE | ORAL | Status: DC

## 2016-06-24 MED FILL — PHENTERMINE 37.5 MG CAPSULE: 37.5 | 30 days supply | Qty: 30 | Fill #0

## 2016-06-24 NOTE — Progress Notes (Signed)
Weight loss success

## 2016-06-24 NOTE — Progress Notes (Signed)
Patient is here for blood pressure and weight check. Denies any trouble sleeping, palpitations, or any other medication problems. Patient has lost weight. A refill for Phentermine will be sent to patient preferred pharmacy. Patient advised to schedule a four week nurse visit and keep her upcoming appointment with her PCP. Verbalized understanding, no further questions.

## 2016-07-17 ENCOUNTER — Encounter: Payer: Self-pay | Admitting: Family Medicine

## 2016-07-17 ENCOUNTER — Other Ambulatory Visit: Payer: Self-pay | Admitting: Family Medicine

## 2016-07-17 DIAGNOSIS — L600 Ingrowing nail: Secondary | ICD-10-CM | POA: Insufficient documentation

## 2016-07-17 MED ORDER — CEPHALEXIN 500 MG PO CAPS: 500.0000 mg | ORAL_CAPSULE | Freq: Three times a day (TID) | ORAL | 0 refills | Status: DC

## 2016-07-17 MED FILL — CEPHALEXIN 500 MG CAPSULE: 500 | 7 days supply | Qty: 21 | Fill #0

## 2016-07-17 NOTE — Progress Notes (Signed)
Partial toenail removal.  [name] Notes continued pain in her left medial great toenail. She notes ingrown toenail for some time now. It has become painful and irritated. She's interested in partial removal.  Procedure note: Consent obtained and timeout performed. Skin cleaned with chlorhexidine cold spray applied and 5 mL of a 50/50% mixture of lidocaine and Marcaine was injected achieving a good digital nerve block. A further 1 mL was used underneath the medial toenail to achieve full anesthesia. A tourniquet was applied and the skin was sterilized with chlorhexidine. Blunt scissors used to dissect the medial 1/5 of the nail. A nail elevator was used and Sharp incision at the nail base was used to free the nail from the underlying tissue. The nail was removed with forceps. The nail plate and matrix was removed with a curet. Chronic ointment and nonadherent dressing was applied Patient tolerated procedure well  Antibiotics will be prescribed

## 2016-07-18 ENCOUNTER — Other Ambulatory Visit: Payer: Self-pay | Admitting: Family Medicine

## 2016-07-22 ENCOUNTER — Other Ambulatory Visit: Payer: Self-pay | Admitting: Family Medicine

## 2016-07-22 MED ORDER — FLUCONAZOLE 150 MG PO TABS: 150.0000 mg | ORAL_TABLET | Freq: Once | ORAL | 1 refills | Status: AC

## 2016-07-22 MED FILL — FLUCONAZOLE 150 MG TABLET: 150 | 2 days supply | Qty: 2 | Fill #0

## 2016-07-22 NOTE — Progress Notes (Signed)
Pt notes yeast infxn due to ABX. Will use fluconazole.

## 2016-07-23 ENCOUNTER — Other Ambulatory Visit: Payer: Self-pay | Admitting: *Deleted

## 2016-07-23 MED ORDER — DESOGESTREL-ETHINYL ESTRADIOL 0.15-30 MG-MCG PO TABS: 1.0000 | ORAL_TABLET | Freq: Every day | ORAL | 3 refills | Status: DC

## 2016-07-30 ENCOUNTER — Ambulatory Visit (INDEPENDENT_AMBULATORY_CARE_PROVIDER_SITE_OTHER): Payer: Self-pay | Admitting: Family Medicine

## 2016-07-30 VITALS — BP 103/71 | HR 98 | Wt 156.0 lb

## 2016-07-30 DIAGNOSIS — R635 Abnormal weight gain: Secondary | ICD-10-CM

## 2016-07-30 MED ORDER — PHENTERMINE HCL 37.5 MG PO CAPS: 37.5000 mg | ORAL_CAPSULE | ORAL | 0 refills | Status: DC

## 2016-07-30 NOTE — Progress Notes (Signed)
Weight loss success

## 2016-07-30 NOTE — Progress Notes (Signed)
Patient is here for blood pressure and weight check. Denies any trouble sleeping, palpitations, or any other medication problems. Patient has lost weight. A refill for Phentermine will be sent to patient preferred pharmacy. Patient advised to schedule a four week nurse visit and keep her upcoming appointment with her PCP. Verbalized understanding, no further questions.

## 2016-08-06 ENCOUNTER — Telehealth: Payer: Self-pay | Admitting: Family Medicine

## 2016-08-06 DIAGNOSIS — R822 Biliuria: Secondary | ICD-10-CM

## 2016-08-06 NOTE — Telephone Encounter (Signed)
Found bilirubin on random UA

## 2016-08-06 NOTE — Addendum Note (Signed)
Addended by: Marcial Pacas on: 08/06/2016 10:57 AM   Modules accepted: Orders

## 2016-08-08 MED FILL — PHENTERMINE 37.5 MG CAPSULE: 37.5 | 30 days supply | Qty: 30 | Fill #0

## 2016-09-25 MED FILL — METOPROLOL SUCC ER 25 MG TA: 25 | 30 days supply | Qty: 30 | Fill #1

## 2016-11-25 ENCOUNTER — Encounter: Payer: Self-pay | Admitting: Family Medicine

## 2016-11-25 ENCOUNTER — Ambulatory Visit (INDEPENDENT_AMBULATORY_CARE_PROVIDER_SITE_OTHER): Payer: Self-pay | Admitting: Family Medicine

## 2016-11-25 VITALS — BP 110/78 | HR 92 | Ht 63.0 in | Wt 167.0 lb

## 2016-11-25 DIAGNOSIS — R635 Abnormal weight gain: Secondary | ICD-10-CM

## 2016-11-25 MED ORDER — PHENTERMINE HCL 37.5 MG PO CAPS
37.5000 mg | ORAL_CAPSULE | ORAL | 0 refills | Status: DC
Start: 2016-11-25 — End: 2016-12-19

## 2016-11-25 MED FILL — PHENTERMINE 37.5 MG CAPSULE: 37.5 | 30 days supply | Qty: 30 | Fill #0

## 2016-11-25 NOTE — Progress Notes (Signed)
       Kelsey Vaughan is a 40 y.o. female who presents to [facility] [place] [facility] today for abnormal weight gain. Patient previously did very well with phentermine for weight control. She stopped phentermine several months ago has regained some weight. She has increased appetite off of phentermine. She's had trouble regulating her diet and tearing to a low carbohydrate diet. Otherwise she feels pretty well. She has a history of palpitations which have since resolved. She notes previously was on phentermine she did not complain of significant palpitations or anxiety. She would like to restart phentermine as part of a comprehensive weight loss program including lower carbohydrate diet and calorie counting.   Past Medical History:  Diagnosis Date  . Foreign body in foot, left 07/2013  . Irregular menses    due to oral contraceptive   Past Surgical History:  Procedure Laterality Date  . CESAREAN SECTION    . FOREIGN BODY REMOVAL Left 08/05/2013   Procedure: LEFT FOOT FOREIGN BODY REMOVAL ADULT;  Surgeon: Wylene Simmer, MD;  Location: [facility];  Service: Orthopedics;  Laterality: Left;  . SHOULDER ARTHROSCOPY Left    Social History  Substance Use Topics  . Smoking status: Current Every Day Smoker    Years: 2.00    Types: Cigarettes  . Smokeless tobacco: Never Used     Comment: 4-5 cig./day  . Alcohol use Yes     Comment: occasionally   family history is not on file.  ROS as above:  Medications: Current Outpatient Prescriptions  Medication Sig Dispense Refill  . desogestrel-ethinyl estradiol (EMOQUETTE) 0.15-30 MG-MCG tablet Take 1 tablet by mouth daily. 3 Package 3  . metoprolol succinate (TOPROL-XL) 25 MG 24 hr tablet Take 1 tablet (25 mg total) by mouth daily. 30 tablet 3  . phentermine 37.5 MG capsule Take 1 capsule (37.5 mg total) by mouth every morning. 30  capsule 0  . valACYclovir (VALTREX) 1000 MG tablet TAKE 1 TABLET BY MOUTH 2 TIMES A DAY 14 tablet 2   No current facility-administered medications for this visit.    Allergies  Allergen Reactions  . Influenza Vaccines Rash    Health Maintenance Health Maintenance  Topic Date Due  . TETANUS/TDAP  08/19/1995  . PAP SMEAR  08/18/1997  . INFLUENZA VACCINE  07/16/2016  . HIV Screening  Completed     Exam:  BP 110/78   Pulse 92   Ht 5\' 3"  (1.6 m)   Wt 167 lb (75.8 kg)   BMI 29.58 kg/m  Gen: Well NAD HEENT: EOMI,  MMM Lungs: Normal work of breathing. CTABL Heart: RRR no MRG Abd: NABS, Soft. Nondistended, Nontender Exts: Brisk capillary refill, warm and well perfused.    No results found for this or any previous visit (from the past 72 hour(s)). No results found.    Assessment and Plan: 40 y.o. female with abnormal weight gain. Restart phentermine. Adhere to a lower calorie lower carbohydrate diet. Recheck in one month. Warned about palpitations while on this medication.   No orders of the defined types were placed in this encounter.   Discussed warning signs or symptoms. Please see discharge instructions. Patient expresses understanding.

## 2016-11-25 NOTE — Patient Instructions (Addendum)
Thank you for coming in today. Restart phenetamine  Work on reduced carbs.  Consider the Zone Diet.  Use a calorie counting service like MyFitnessPal. Recheck blood pressure and weight in 1 month.  Call or go to the emergency room if you get worse, have trouble breathing, have chest pains, or palpitations.  Take  Toprol while taking phentermine.

## 2016-12-17 ENCOUNTER — Telehealth: Payer: Self-pay | Admitting: General Practice

## 2016-12-17 NOTE — Telephone Encounter (Signed)
Patient would like to know if Dr. Ronnald Ramp would take her on as patient? Note to scheduler:  Pt has [facility]

## 2016-12-18 DIAGNOSIS — R002 Palpitations: Secondary | ICD-10-CM | POA: Diagnosis not present

## 2016-12-18 DIAGNOSIS — I480 Paroxysmal atrial fibrillation: Secondary | ICD-10-CM | POA: Diagnosis not present

## 2016-12-19 ENCOUNTER — Ambulatory Visit (INDEPENDENT_AMBULATORY_CARE_PROVIDER_SITE_OTHER): Payer: 59 | Admitting: Physician Assistant

## 2016-12-19 ENCOUNTER — Encounter: Payer: Self-pay | Admitting: Physician Assistant

## 2016-12-19 VITALS — BP 112/68 | HR 104 | Wt 160.0 lb

## 2016-12-19 DIAGNOSIS — Z0000 Encounter for general adult medical examination without abnormal findings: Secondary | ICD-10-CM

## 2016-12-19 DIAGNOSIS — G4452 New daily persistent headache (NDPH): Secondary | ICD-10-CM | POA: Insufficient documentation

## 2016-12-19 DIAGNOSIS — N946 Dysmenorrhea, unspecified: Secondary | ICD-10-CM | POA: Insufficient documentation

## 2016-12-19 DIAGNOSIS — F17200 Nicotine dependence, unspecified, uncomplicated: Secondary | ICD-10-CM | POA: Insufficient documentation

## 2016-12-19 DIAGNOSIS — G44209 Tension-type headache, unspecified, not intractable: Secondary | ICD-10-CM | POA: Diagnosis not present

## 2016-12-19 DIAGNOSIS — F411 Generalized anxiety disorder: Secondary | ICD-10-CM

## 2016-12-19 DIAGNOSIS — R209 Unspecified disturbances of skin sensation: Secondary | ICD-10-CM | POA: Diagnosis not present

## 2016-12-19 MED ORDER — NORETHINDRONE 0.35 MG PO TABS: 1.0000 | ORAL_TABLET | Freq: Every day | ORAL | 11 refills | Status: DC

## 2016-12-19 MED FILL — NORETHINDRONE 0.35 MG TAB: 0.35 | 84 days supply | Qty: 84 | Fill #0

## 2016-12-19 NOTE — Telephone Encounter (Signed)
yes

## 2016-12-19 NOTE — Progress Notes (Signed)
HPI:                                                                Kelsey Vaughan is a 41 y.o. female who presents to [facility]: [place] today to establish care and c/o cold feet  Cold feet: patient reports bilateral cold feet daily x 1 year. Nonpainful. No skin color change. Some tingling and mild numbness. Worse outside. Wearing socks does not help.   Anxiety: feels it has gotten worse since her son joined the [employer]. She feels it is also contributing to her palpitations. She has been having frontal headaches multiple days per week. Denies associated nausea or photophobia.   Palpitations: patient is currently being worked up for possible Afib by cardiology. She is having a holter monitor placed this week. She is prescribed Toprol-XL 25mg , but is taking half because she feels it makes her tired.   Hematuria: this is a chronic problem. Patient is being followed by Urology. She is scheduled to have a cystoscopy and will send the results.  Tobacco use: current every day smoker, 1 pack per week since 2011. She has been able to quit cold [place] in the past. She did not smoke from 2006-2011.Feels like she is not ready to quit due to her anxiety.    Health Maintenance Health Maintenance  Topic Date Due  . TETANUS/TDAP  08/19/1995  . PAP SMEAR  02/26/2019  . HIV Screening  Completed  . INFLUENZA VACCINE  Excluded  Patient would like to start screening mammograms She is allergic to influenza vaccines States tetanus is up to date  GYN/Sexual Health  Menstrual status: having periods  LMP:   Menses: regular, dysmenorrhea  Last pap smear: 02/26/16  History of abnormal pap smears: no  Sexually active: yes  Current contraception: combo OCP's  Health Habits  Diet: rated "fair," 2 cups coffee/soda per day  Exercise: not regularly  ETOH:  Tobacco: yes  Drugs: no  Dental Exam: up to date  Past Medical History:  Diagnosis Date   . Atrial fibrillation ([place])   . Foreign body in foot, left 07/2013  . Irregular menses    due to oral contraceptive   Past Surgical History:  Procedure Laterality Date  . CESAREAN SECTION    . FOREIGN BODY REMOVAL Left 08/05/2013   Procedure: LEFT FOOT FOREIGN BODY REMOVAL ADULT;  Surgeon: Wylene Simmer, MD;  Location: [facility];  Service: Orthopedics;  Laterality: Left;  . SHOULDER ARTHROSCOPY Left    Social History  Substance Use Topics  . Smoking status: Current Every Day Smoker    Years: 5.00    Types: Cigarettes  . Smokeless tobacco: Never Used     Comment: 4-5 cigs/day  . Alcohol use Yes     Comment: occasionally   family history is not on file.  Review of Systems  Constitutional: Negative.   HENT: Negative.   Eyes: Positive for blurred vision. Negative for photophobia.  Respiratory: Negative for shortness of breath.   Cardiovascular: Positive for palpitations.  Gastrointestinal: Negative.  Negative for nausea.  Genitourinary: Negative.   Musculoskeletal: Negative.   Skin: Negative.   Neurological: Positive for tingling (feet), sensory change (feet) and headaches. Negative for focal weakness.  Psychiatric/Behavioral: Positive for  depression. The patient is nervous/anxious and has insomnia.      Medications: Current Outpatient Prescriptions  Medication Sig Dispense Refill  . metoprolol succinate (TOPROL-XL) 25 MG 24 hr tablet Take 1 tablet (25 mg total) by mouth daily. 30 tablet 3  . norethindrone (ORTHO MICRONOR) 0.35 MG tablet Take 1 tablet (0.35 mg total) by mouth daily. 1 Package 11   No current facility-administered medications for this visit.    Allergies  Allergen Reactions  . Influenza Vaccines Rash       Objective:  BP 112/68   Pulse (!) 104   Wt 160 lb (72.6 kg)   BMI 28.34 kg/m  Gen: well-groomed, cooperative, not ill-appearing, no distress HEENT: normal conjunctiva, TM's clear, oropharynx clear, moist mucus membranes, no  thyromegaly or tenderness Lungs: Normal work of breathing, clear to auscultation bilaterally Heart: Rate 90, rhythm with occasional extra systoles, s1 and s2 distinct, no murmurs, clicks or rubs appreciated on this exam, no carotid bruit Abd: Soft. Nondistended, Nontender Neuro: alert and oriented x 3, EOM's intact, PERRLA, DTR's intact, sensation to light touch and pain intact in feet bilaterally MSK: bilateral feet with full active ROM, strength 5/5 and symmetric, no deformity, normal gait Extremities: PT pulses 2+ and symmetric, DP pulses 1+ and symmetric, feet are cold to the touch, especially the 4th and 5th phalanges, no peripheral edema Skin: warm and dry, no rashes or lesions on exposed skin Psych: normal affect, pleasant mood, normal speech and thought content  Depression screen [facility] 2/9 12/19/2016  Decreased Interest 0  Down, Depressed, Hopeless 1  PHQ - 2 Score 1  Altered sleeping 1  Tired, decreased energy 1  Change in appetite 1  Feeling bad or failure about yourself  0  Trouble concentrating 0  Moving slowly or fidgety/restless 0  Suicidal thoughts 0  PHQ-9 Score 4    GAD 7 : Generalized Anxiety Score 12/19/2016  Nervous, Anxious, on Edge 1  Control/stop worrying 2  Worry too much - different things 3  Trouble relaxing 2  Restless 3  Easily annoyed or irritable 3  Afraid - awful might happen 2  Total GAD 7 Score 16    Assessment and Plan: 41 y.o. female with   1. Encounter for preventive health examination - MM Digital Screening - CBC - Comprehensive metabolic panel - Hemoglobin A1c - Lipid panel - VITAMIN D 25 Hydroxy (Vit-D Deficiency, Fractures) - TSH  2. Dysmenorrhea - we discussed the risks of taking estrogen while smoking and patient is not ready for smoking cessation. Given patient's CV risk and Afib, risk outweighs benefit. Discontinuing combo OCP's and switching to mini-pill - norethindrone (ORTHO MICRONOR) 0.35 MG tablet; Take 1 tablet (0.35 mg  total) by mouth daily.  Dispense: 1 Package; Refill: 11  3. Bilateral cold feet - proceeding with workup for peripheral neuropathy. If labs are negative, will submit a referral for nerve conduction study - Vitamin B12 - Heavy Metals, Blood  4. Tension headache - likely related to anxiety and increased stress - Tylenol 1000mg  every 6 hours as needed  5. GAD (generalized anxiety disorder) - GAD score of 17 today, placing her in severe category. Patient is already on a beta blocker for dysrhythmia. She does not want to try SSRI or other meds at this time. - Ambulatory referral to Psychiatry for counseling  6. Tobacco dependence - provided information for the [place] Quit Line and steps to quit smoking - offered to re-address when her anxiety is better controlled  Patient education and anticipatory guidance given Patient agrees with treatment plan Follow-up in 6 months or sooner as needed  Darlyne Russian PA-C

## 2016-12-19 NOTE — Patient Instructions (Addendum)
Stop your current pill pack. You will have a withdrawal bleed. Start the new pack 5 days after the start of your period.  Tylenol 1000mg  as needed for tension headache  If all of your blood work is normal, will order a nerve conduction study to rule-out a peripheral neuropathy  When you feel like anxiety is better managed and are ready to quit smoking, please let me know. I am here as a resource always. You can also call the [employer] 1-800-QUIT-NOW 949-832-9017).  Steps to Quit Smoking Smoking tobacco can be harmful to your health and can affect almost every organ in your body. Smoking puts you, and those around you, at risk for developing many serious chronic diseases. Quitting smoking is difficult, but it is one of the best things that you can do for your health. It is never too late to quit. What are the benefits of quitting smoking? When you quit smoking, you lower your risk of developing serious diseases and conditions, such as:  Lung cancer or lung disease, such as COPD.  Heart disease.  Stroke.  Heart attack.  Infertility.  Osteoporosis and bone fractures. Additionally, symptoms such as coughing, wheezing, and shortness of breath may get better when you quit. You may also find that you get sick less often because your body is stronger at fighting off colds and infections. If you are pregnant, quitting smoking can help to reduce your chances of having a baby of low birth weight. How do I get ready to quit? When you decide to quit smoking, create a plan to make sure that you are successful. Before you quit:  Pick a date to quit. Set a date within the next two weeks to give you time to prepare.  Write down the reasons why you are quitting. Keep this list in places where you will see it often, such as on your bathroom mirror or in your car or wallet.  Identify the people, places, things, and activities that make you want to smoke (triggers) and avoid them. Make sure to take  these actions:  Throw away all cigarettes at home, at work, and in your car.  Throw away smoking accessories, such as Scientist, research (medical).  Clean your car and make sure to empty the ashtray.  Clean your home, including curtains and carpets.  Tell your family, friends, and coworkers that you are quitting. Support from your loved ones can make quitting easier.  Talk with your health care provider about your options for quitting smoking.  Find out what treatment options are covered by your health insurance. What strategies can I use to quit smoking? Talk with your healthcare provider about different strategies to quit smoking. Some strategies include:  Quitting smoking altogether instead of gradually lessening how much you smoke over a period of time. Research shows that quitting "cold [place]" is more successful than gradually quitting.  Attending in-person counseling to help you build problem-solving skills. You are more likely to have success in quitting if you attend several counseling sessions. Even short sessions of 10 minutes can be effective.  Finding resources and support systems that can help you to quit smoking and remain smoke-free after you quit. These resources are most helpful when you use them often. They can include:  Online chats with a Social worker.  Telephone quitlines.  Printed Furniture conservator/restorer.  Support groups or group counseling.  Text messaging programs.  Mobile phone applications.  Taking medicines to help you quit smoking. (If you are pregnant  or breastfeeding, talk with your health care provider first.) Some medicines contain nicotine and some do not. Both types of medicines help with cravings, but the medicines that include nicotine help to relieve withdrawal symptoms. Your health care provider may recommend:  Nicotine patches, gum, or lozenges.  Nicotine inhalers or sprays.  Non-nicotine medicine that is taken by mouth. Talk with your health care  provider about combining strategies, such as taking medicines while you are also receiving in-person counseling. Using these two strategies together makes you more likely to succeed in quitting than if you used either strategy on its own. If you are pregnant or breastfeeding, talk with your health care provider about finding counseling or other support strategies to quit smoking. Do not take medicine to help you quit smoking unless told to do so by your health care provider. What things can I do to make it easier to quit? Quitting smoking might feel overwhelming at first, but there is a lot that you can do to make it easier. Take these important actions:  Reach out to your family and friends and ask that they support and encourage you during this time. Call telephone quitlines, reach out to support groups, or work with a counselor for support.  Ask people who smoke to avoid smoking around you.  Avoid places that trigger you to smoke, such as bars, parties, or smoke-break areas at work.  Spend time around people who do not smoke.  Lessen stress in your life, because stress can be a smoking trigger for some people. To lessen stress, try:  Exercising regularly.  Deep-breathing exercises.  Yoga.  Meditating.  Performing a body scan. This involves closing your eyes, scanning your body from head to toe, and noticing which parts of your body are particularly tense. Purposefully relax the muscles in those areas.  Download or purchase mobile phone or tablet apps (applications) that can help you stick to your quit plan by providing reminders, tips, and encouragement. There are many free apps, such as QuitGuide from the [employer] Office manager for Disease Control and Prevention). You can find other support for quitting smoking (smoking cessation) through smokefree.gov and other websites. How will I feel when I quit smoking? Within the first 24 hours of quitting smoking, you may start to feel some withdrawal  symptoms. These symptoms are usually most noticeable 2-3 days after quitting, but they usually do not last beyond 2-3 weeks. Changes or symptoms that you might experience include:  Mood swings.  Restlessness, anxiety, or irritation.  Difficulty concentrating.  Dizziness.  Strong cravings for sugary foods in addition to nicotine.  Mild weight gain.  Constipation.  Nausea.  Coughing or a sore throat.  Changes in how your medicines work in your body.  A depressed mood.  Difficulty sleeping (insomnia). After the first 2-3 weeks of quitting, you may start to notice more positive results, such as:  Improved sense of smell and taste.  Decreased coughing and sore throat.  Slower heart rate.  Lower blood pressure.  Clearer skin.  The ability to breathe more easily.  Fewer sick days. Quitting smoking is very challenging for most people. Do not get discouraged if you are not successful the first time. Some people need to make many attempts to quit before they achieve long-term success. Do your best to stick to your quit plan, and talk with your health care provider if you have any questions or concerns. This information is not intended to replace advice given to you by your health  care provider. Make sure you discuss any questions you have with your health care provider. Document Released: 11/26/2001 Document Revised: 07/30/2016 Document Reviewed: 04/18/2015 Elsevier Interactive Patient Education  2017 [employer].

## 2016-12-20 ENCOUNTER — Other Ambulatory Visit: Payer: Self-pay | Admitting: Physician Assistant

## 2016-12-20 DIAGNOSIS — Z0000 Encounter for general adult medical examination without abnormal findings: Secondary | ICD-10-CM | POA: Diagnosis not present

## 2016-12-20 DIAGNOSIS — R8290 Unspecified abnormal findings in urine: Secondary | ICD-10-CM

## 2016-12-20 DIAGNOSIS — R350 Frequency of micturition: Secondary | ICD-10-CM

## 2016-12-20 DIAGNOSIS — R209 Unspecified disturbances of skin sensation: Secondary | ICD-10-CM | POA: Diagnosis not present

## 2016-12-20 NOTE — Telephone Encounter (Signed)
Patient has established at another [facility] practice.

## 2016-12-21 LAB — LIPID PANEL
Cholesterol: 163 mg/dL (ref 0–200)
HDL: 52 mg/dL (ref 35–70)
LDL CALC: 74 mg/dL
Triglycerides: 187 mg/dL — AB (ref 40–160)

## 2016-12-21 LAB — HEPATIC FUNCTION PANEL
ALT: 9 U/L (ref 7–35)
AST: 8 U/L — AB (ref 13–35)
Alkaline Phosphatase: 65 U/L (ref 25–125)
BILIRUBIN, TOTAL: 0.2 mg/dL

## 2016-12-21 LAB — COMPLETE METABOLIC PANEL WITH GFR
EGFR (African American): 99
MCV: 86
RBC: 4.35

## 2016-12-21 LAB — BASIC METABOLIC PANEL
BUN: 12 mg/dL (ref 4–21)
Creatinine: 0.9 mg/dL (ref <=1.1)

## 2016-12-21 LAB — HEAVY METALS PROFILE II, BLOOD: Vitamin B-12: 201

## 2016-12-21 LAB — HGB A1C W/O EAG
Hemoglobin A1C: 5.5
TSH: 0.75
VIT D 25 HYDROXY: 11

## 2016-12-21 LAB — CBC AND DIFFERENTIAL: Hemoglobin: 12.4 g/dL (ref 12.0–16.0)

## 2016-12-23 ENCOUNTER — Other Ambulatory Visit: Payer: Self-pay | Admitting: Physician Assistant

## 2016-12-23 ENCOUNTER — Ambulatory Visit (INDEPENDENT_AMBULATORY_CARE_PROVIDER_SITE_OTHER): Payer: 59

## 2016-12-23 ENCOUNTER — Telehealth: Payer: Self-pay | Admitting: *Deleted

## 2016-12-23 ENCOUNTER — Other Ambulatory Visit (INDEPENDENT_AMBULATORY_CARE_PROVIDER_SITE_OTHER): Payer: 59 | Admitting: Physician Assistant

## 2016-12-23 DIAGNOSIS — E559 Vitamin D deficiency, unspecified: Secondary | ICD-10-CM | POA: Insufficient documentation

## 2016-12-23 DIAGNOSIS — R319 Hematuria, unspecified: Secondary | ICD-10-CM

## 2016-12-23 DIAGNOSIS — N3001 Acute cystitis with hematuria: Secondary | ICD-10-CM

## 2016-12-23 DIAGNOSIS — E538 Deficiency of other specified B group vitamins: Secondary | ICD-10-CM

## 2016-12-23 DIAGNOSIS — R3129 Other microscopic hematuria: Secondary | ICD-10-CM

## 2016-12-23 LAB — POCT URINALYSIS DIPSTICK
BILIRUBIN UA: NEGATIVE
Glucose, UA: NEGATIVE
LEUKOCYTES UA: NEGATIVE
Nitrite, UA: NEGATIVE
Protein, UA: >=300
Spec Grav, UA: 1.025
Urobilinogen, UA: 0.2
pH, UA: 7

## 2016-12-23 MED ORDER — VITAMIN D (ERGOCALCIFEROL) 1.25 MG (50000 UNIT) PO CAPS: 50000.0000 [IU] | ORAL_CAPSULE | ORAL | 0 refills | Status: DC

## 2016-12-23 MED ORDER — NITROFURANTOIN MONOHYD MACRO 100 MG PO CAPS: 100.0000 mg | ORAL_CAPSULE | Freq: Two times a day (BID) | ORAL | 0 refills | Status: DC

## 2016-12-23 NOTE — Progress Notes (Signed)
Patient found to have vitamin d deficiency (11.0) and B12 deficiency (201)  Sent Drisdol 50000 to pharmacy  Setting patient up for monthly B12 injections x 6 months, then plan to transition to oral

## 2016-12-23 NOTE — Progress Notes (Signed)
Patient with history of chronic hematuria with complaints of lower abdominal pain today. Denies fevers, dysuria, frequency, urgency, flank pain.  UA positive for trace ketones, hematuria (large), protein (>=300).  Past urine cultures reviewed and have been nonspecific suggesting subclinical infection or contamination. Patient has been prescribed Keflex in the past.  Macrobid sent to the pharmacy.  Patient has an appt. with Urology on 12/30/16. Ordered a renal US to evaluate chronic hematuria.  Patient education and anticipatory guidance given Patient agrees with treatment plan Follow-up as needed if symptoms worsen or fail to improve.  Darlyne Russian PA-C

## 2016-12-24 ENCOUNTER — Other Ambulatory Visit: Payer: Self-pay

## 2016-12-24 ENCOUNTER — Other Ambulatory Visit: Payer: Self-pay | Admitting: Physician Assistant

## 2016-12-24 ENCOUNTER — Other Ambulatory Visit: Payer: Self-pay | Admitting: *Deleted

## 2016-12-24 DIAGNOSIS — R300 Dysuria: Secondary | ICD-10-CM

## 2016-12-24 LAB — MICROSCOPIC EXAMINATION

## 2016-12-24 MED ORDER — VALACYCLOVIR HCL 1 G PO TABS: 1000.0000 mg | ORAL_TABLET | Freq: Two times a day (BID) | ORAL | 2 refills | Status: DC

## 2016-12-24 MED ORDER — VALACYCLOVIR HCL 1 G PO TABS: 1000.0000 mg | ORAL_TABLET | Freq: Two times a day (BID) | ORAL | 11 refills | Status: DC

## 2016-12-24 MED FILL — VIT D2 1.25 MG (50,000 UNIT: 1.25 MG | 56 days supply | Qty: 8 | Fill #0

## 2016-12-24 MED FILL — valACYclovir HCL 1 GM TABS: 1 | 7 days supply | Qty: 14 | Fill #0

## 2016-12-25 ENCOUNTER — Other Ambulatory Visit: Payer: Self-pay | Admitting: Physician Assistant

## 2016-12-25 DIAGNOSIS — R300 Dysuria: Secondary | ICD-10-CM

## 2016-12-25 DIAGNOSIS — N3001 Acute cystitis with hematuria: Secondary | ICD-10-CM

## 2016-12-25 NOTE — Telephone Encounter (Signed)
Pt.notified

## 2016-12-25 NOTE — Telephone Encounter (Signed)
-----   Message from [facility], [place] sent at 12/24/2016  4:46 PM EST ----- Please contact patient to schedule monthly B12 injections for 6 months

## 2016-12-29 LAB — URINE CULTURE: ORGANISM ID, BACTERIA: NO GROWTH

## 2016-12-30 DIAGNOSIS — R311 Benign essential microscopic hematuria: Secondary | ICD-10-CM | POA: Diagnosis not present

## 2016-12-31 ENCOUNTER — Ambulatory Visit (INDEPENDENT_AMBULATORY_CARE_PROVIDER_SITE_OTHER): Payer: 59 | Admitting: Sports Medicine

## 2016-12-31 ENCOUNTER — Other Ambulatory Visit: Payer: Self-pay | Admitting: Physician Assistant

## 2016-12-31 VITALS — BP 121/78 | HR 110 | Temp 98.1°F

## 2016-12-31 DIAGNOSIS — R3129 Other microscopic hematuria: Secondary | ICD-10-CM

## 2016-12-31 DIAGNOSIS — E538 Deficiency of other specified B group vitamins: Secondary | ICD-10-CM

## 2016-12-31 MED ORDER — CYANOCOBALAMIN 1000 MCG/ML IJ SOLN
1000.0000 ug | Freq: Once | INTRAMUSCULAR | Status: AC
  Administered 2016-12-31: 1000 ug via INTRAMUSCULAR

## 2016-12-31 NOTE — Progress Notes (Signed)
Patient was seen in clinic today for monthly B12 injection. This is the patients first B12 injection per PCP recommendation. Pt is to get monthly B12 injections for 6 months. Patient tolerated injection in right deltoid well, no immediate complications. Will set up next appt for 4 weeks.

## 2017-01-14 DIAGNOSIS — R3129 Other microscopic hematuria: Secondary | ICD-10-CM | POA: Diagnosis not present

## 2017-01-14 DIAGNOSIS — Z8639 Personal history of other endocrine, nutritional and metabolic disease: Secondary | ICD-10-CM | POA: Diagnosis not present

## 2017-01-14 DIAGNOSIS — Z720 Tobacco use: Secondary | ICD-10-CM | POA: Diagnosis not present

## 2017-01-16 ENCOUNTER — Ambulatory Visit: Payer: 59 | Admitting: Physician Assistant

## 2017-01-20 ENCOUNTER — Encounter: Payer: Self-pay | Admitting: Physician Assistant

## 2017-01-21 ENCOUNTER — Other Ambulatory Visit: Payer: Self-pay | Admitting: Physician Assistant

## 2017-01-21 DIAGNOSIS — E559 Vitamin D deficiency, unspecified: Secondary | ICD-10-CM

## 2017-01-21 MED ORDER — VITAMIN D (ERGOCALCIFEROL) 1.25 MG (50000 UNIT) PO CAPS: 50000.0000 [IU] | ORAL_CAPSULE | ORAL | 0 refills | Status: DC

## 2017-01-27 DIAGNOSIS — R002 Palpitations: Secondary | ICD-10-CM | POA: Diagnosis not present

## 2017-01-28 DIAGNOSIS — H52223 Regular astigmatism, bilateral: Secondary | ICD-10-CM | POA: Diagnosis not present

## 2017-01-29 ENCOUNTER — Encounter: Payer: Self-pay | Admitting: Physician Assistant

## 2017-01-29 ENCOUNTER — Ambulatory Visit (INDEPENDENT_AMBULATORY_CARE_PROVIDER_SITE_OTHER): Payer: 59 | Admitting: Physician Assistant

## 2017-01-29 VITALS — BP 128/73 | HR 115 | Wt 163.0 lb

## 2017-01-29 DIAGNOSIS — R000 Tachycardia, unspecified: Secondary | ICD-10-CM

## 2017-01-29 DIAGNOSIS — I491 Atrial premature depolarization: Secondary | ICD-10-CM | POA: Diagnosis not present

## 2017-01-29 DIAGNOSIS — R002 Palpitations: Secondary | ICD-10-CM | POA: Insufficient documentation

## 2017-01-29 DIAGNOSIS — I951 Orthostatic hypotension: Secondary | ICD-10-CM | POA: Diagnosis not present

## 2017-01-29 MED ORDER — NEBIVOLOL HCL 2.5 MG PO TABS: 2.5000 mg | ORAL_TABLET | Freq: Every day | ORAL | 5 refills | Status: DC

## 2017-01-29 NOTE — Progress Notes (Signed)
HPI:                                                                Kelsey Vaughan is a 41 y.o. female who presents to [place]: [place] today for lightheadedness  Patient reports lightheadedness and an episode of "blacking out" while seated at work yesterday. Endorses nausea, palpitations, and frontal headache. Denies chest pain or diaphoresis. Denies LOC. Denies vertigo. Patient reports she is only drinking 1 bottle of water per day, 1 soda at breakfast, and sometimes 1 soda at lunch. She is making urine that is dark yellow.  Patient is currently being followed by cardiology for a dysrhythmia work-up. She recently completed continuous cardiac event monitor and found to have intermittent sinus tachycardia and PVC's. She self-discontinued her Metoprolol on Friday. Patient reports she stopped it because it was making her feel tired. She tried breaking it in half and taking it bid, but states that didn't help with fatigue. She states she has spoken to her cardiologist about this.  She also endorses blurred vision. Patient states she went to her ophthalmologist yesterday and was diagnosed as far-sighted.  Past Medical History:  Diagnosis Date  . Anxiety   . Foreign body in foot, left 07/2013  . Irregular menses    due to oral contraceptive  . Palpitations   . Sinus tachycardia    Past Surgical History:  Procedure Laterality Date  . CESAREAN SECTION    . FOREIGN BODY REMOVAL Left 08/05/2013   Procedure: LEFT FOOT FOREIGN BODY REMOVAL ADULT;  Surgeon: Wylene Simmer, MD;  Location: [place];  Service: Orthopedics;  Laterality: Left;  . SHOULDER ARTHROSCOPY Left    Social History  Substance Use Topics  . Smoking status: Current Every Day Smoker    Years: 5.00    Types: Cigarettes  . Smokeless tobacco: Never Used     Comment: 4-5 cigs/day  . Alcohol use Yes     Comment: occasionally   family history is not on file.  ROS:  negative except as noted in the HPI  Medications: Current Outpatient Prescriptions  Medication Sig Dispense Refill  . nitrofurantoin, macrocrystal-monohydrate, (MACROBID) 100 MG capsule Take 1 capsule (100 mg total) by mouth 2 (two) times daily. 14 capsule 0  . norethindrone (ORTHO MICRONOR) 0.35 MG tablet Take 1 tablet (0.35 mg total) by mouth daily. 1 Package 11  . valACYclovir (VALTREX) 1000 MG tablet Take 1 tablet (1,000 mg total) by mouth 2 (two) times daily. 14 tablet 11  . Vitamin D, Ergocalciferol, (DRISDOL) 50000 units CAPS capsule Take 1 capsule (50,000 Units total) by mouth every 7 (seven) days. Take for 8 total doses(weeks) 8 capsule 0  . nebivolol (BYSTOLIC) 2.5 MG tablet Take 1 tablet (2.5 mg total) by mouth daily. 30 tablet 5   No current facility-administered medications for this visit.    Allergies  Allergen Reactions  . Influenza Vaccines Rash       Objective:  BP 128/73   Pulse (!) 115   Wt 163 lb (73.9 kg)   SpO2 100%   BMI 28.87 kg/m  Gen: well-groomed, cooperative, not ill-appearing, no distress Pulm: Normal work of breathing, normal phonation, clear to auscultation bilaterally, no wheezes, rales or rhonchi CV: Tachycardic,  regular rhythm, s1 and s2 distinct, no murmurs, clicks or rubs, no peripheral edema Neuro: alert and oriented x 3, cranial nerves II-XII intact, DTR's 1+ and symmetric, normal coordination MSK: strength 5/5 and symmetric in bilateral upper and lower extremities, normal gait and station Skin: warm and dry, no rashes or lesions on exposed skin, no cyanosis  Cardiac Event Monitor (01/27/2017 3:03 PM) Cardiac Event Monitor (01/27/2017 3:03 PM)  Impressions  Cardiac event recorder reveals predominantly mild sinus tachycardia with fairly frequent but isolated PVCs. No other cardiac ectopy or cardiac arrhythmias were noted. Symptoms of palpitations tend to correlate with the isolated PVCs.    Electronically signed:    Michell Heinrich,  MD  01/27/2017  3:03 PM      Cardiac Event Monitor (01/27/2017 3:03 PM)  Narrative  [facility] Event Report    Date:12/25/16-01/23/17    Referring MD:    Ordering MD: Cathlean Sauer NP     Reading MD: Lamar Blinks MD    Reason for study: Palpitations       I personally reviewed the ECG performed today which shows rate 103 bpm, normal PRI, prolonged Qtc of 463, and premature atrial contractions  Assessment and Plan: 41 y.o. female with   1. Orthostasis - patient declined IV fluids. Agreed to push PO fluids - EKG 12-Lead  2. Tachycardia with heart rate 100-120 beats per minute, PAC's - this is 2/2 to noncompliance with beta blocker. No PSVT or Afib on ECG or on continuous cardiac event monitoring in January - discontinuing Toprol XL due to patient intolerance and starting Bystolic 2.5 mg daily - checking TSH, free T4 today - nebivolol (BYSTOLIC) 2.5 MG tablet; Take 1 tablet (2.5 mg total) by mouth daily.  Dispense: 30 tablet; Refill: 5  Patient education and anticipatory guidance given Patient agrees with treatment plan Follow-up in 1 month or sooner as needed  Darlyne Russian PA-C

## 2017-01-30 ENCOUNTER — Telehealth: Payer: Self-pay | Admitting: Physician Assistant

## 2017-01-30 DIAGNOSIS — R209 Unspecified disturbances of skin sensation: Secondary | ICD-10-CM

## 2017-01-30 NOTE — Telephone Encounter (Signed)
Patient continues to complain of nonpainful bilateral cold feet despite vitamin B12 supplementation. She reports change in skin color last night, where soles of her feet were white in some areas and red in others. She would like to proceed with further diagnostic testing. Ordering ABI.

## 2017-01-31 ENCOUNTER — Other Ambulatory Visit: Payer: Self-pay | Admitting: Physician Assistant

## 2017-01-31 DIAGNOSIS — R209 Unspecified disturbances of skin sensation: Secondary | ICD-10-CM

## 2017-02-05 ENCOUNTER — Ambulatory Visit (INDEPENDENT_AMBULATORY_CARE_PROVIDER_SITE_OTHER): Payer: 59 | Admitting: Physician Assistant

## 2017-02-05 VITALS — BP 123/83 | HR 105 | Wt 167.0 lb

## 2017-02-05 DIAGNOSIS — G4452 New daily persistent headache (NDPH): Secondary | ICD-10-CM | POA: Diagnosis not present

## 2017-02-05 DIAGNOSIS — E538 Deficiency of other specified B group vitamins: Secondary | ICD-10-CM

## 2017-02-05 DIAGNOSIS — R000 Tachycardia, unspecified: Secondary | ICD-10-CM

## 2017-02-05 MED ORDER — TOPIRAMATE 50 MG PO TABS: ORAL_TABLET | ORAL | 3 refills | Status: DC

## 2017-02-05 MED ORDER — DEXAMETHASONE SODIUM PHOSPHATE 4 MG/ML IJ SOLN
4.0000 mg | Freq: Once | INTRAMUSCULAR | Status: AC
  Administered 2017-02-05: 4 mg via INTRAMUSCULAR

## 2017-02-05 MED ORDER — SUMATRIPTAN SUCCINATE 100 MG PO TABS: 100.0000 mg | ORAL_TABLET | Freq: Once | ORAL | 0 refills | Status: DC | PRN

## 2017-02-05 MED ORDER — METOCLOPRAMIDE HCL 10 MG PO TABS: ORAL_TABLET | ORAL | 3 refills | Status: DC

## 2017-02-05 MED ORDER — SUMATRIPTAN SUCCINATE 6 MG/0.5ML ~~LOC~~ SOLN
6.0000 mg | Freq: Once | SUBCUTANEOUS | Status: AC
  Administered 2017-02-05: 6 mg via SUBCUTANEOUS

## 2017-02-05 MED ORDER — METOCLOPRAMIDE HCL 5 MG/ML IJ SOLN
10.0000 mg | Freq: Once | INTRAVENOUS | Status: AC
  Administered 2017-02-05: 10 mg via INTRAMUSCULAR

## 2017-02-05 MED ORDER — CYANOCOBALAMIN 1000 MCG/ML IJ SOLN
1000.0000 ug | Freq: Once | INTRAMUSCULAR | Status: AC
  Administered 2017-02-05: 1000 ug via INTRAMUSCULAR

## 2017-02-05 MED FILL — SUMATRIPTAN SUCC 100 MG TAB: 100 | 30 days supply | Qty: 9 | Fill #0

## 2017-02-05 MED FILL — METOCLOPRAMIDE 10 MG TABLET: 10 | 10 days supply | Qty: 30 | Fill #0

## 2017-02-05 MED FILL — TOPIRAMATE 50 MG TABLET: 50 | 30 days supply | Qty: 30 | Fill #0

## 2017-02-05 NOTE — Patient Instructions (Signed)
For migraine prophylaxis: - Topamax 50mg  nightly  For migraine abortive therapy: - Sumatriptan 100mg  + Metoclopramide 10mg  + Tylenol 1000mg  If headache persists after 1 hour - Metoclopramide 10mg  + Benadryl 25mg  - Avoid lights/screens. Rest in a dark room  Take your headache medication as prescribed. Do not use Sumatriptan more than 2 days/week.   Keep track of headaches with headache diary handout. Include as much detail as possible, and log food/drinks. The goal is to identify your individual headache triggers.  Headache prevention:  Follow a low-tyramine diet: AVOID aged cheeses, pickled and fermented products (see handout for more detail)  Avoid alcohol  Avoid known triggers  Follow-up if symptoms worsen or do not improve

## 2017-02-05 NOTE — Progress Notes (Signed)
HPI:                                                                Kelsey Vaughan is a 41 y.o. female who presents to [facility] today for headache  Onset: September Duration: Reports headaches 3-4 days per week lasting hours to days.  Character: Pressure and "thumping" pain, Patient states it incapacitates her and she has to sleep them off Location: Frontotemporal - alternates sides, radiates to her jaw Associated symptoms: endorses photophobia and sometimes nausea Pertinent negatives: denies paresthesias, focal weakness, syncope or vision change Treatments tried: Tylenol, sleep with inconsistent relief  Past Medical History:  Diagnosis Date  . Anxiety   . B12 deficiency   . Foreign body in foot, left 07/2013  . Irregular menses    due to oral contraceptive  . Palpitations   . Sinus tachycardia    Past Surgical History:  Procedure Laterality Date  . CESAREAN SECTION    . FOREIGN BODY REMOVAL Left 08/05/2013   Procedure: LEFT FOOT FOREIGN BODY REMOVAL ADULT;  Surgeon: Wylene Simmer, MD;  Location: [place];  Service: Orthopedics;  Laterality: Left;  . SHOULDER ARTHROSCOPY Left    Social History  Substance Use Topics  . Smoking status: Current Every Day Smoker    Years: 5.00    Types: Cigarettes  . Smokeless tobacco: Never Used     Comment: 4-5 cigs/day  . Alcohol use Yes     Comment: occasionally   family history is not on file.  ROS: negative except as noted in the HPI  Medications: Current Outpatient Prescriptions  Medication Sig Dispense Refill  . norethindrone (ORTHO MICRONOR) 0.35 MG tablet Take 1 tablet (0.35 mg total) by mouth daily. 1 Package 11  . valACYclovir (VALTREX) 1000 MG tablet Take 1 tablet (1,000 mg total) by mouth 2 (two) times daily. 14 tablet 11  . Vitamin D, Ergocalciferol, (DRISDOL) 50000 units CAPS capsule Take 1 capsule (50,000 Units total) by mouth every 7 (seven) days. Take  for 8 total doses(weeks) 8 capsule 0  . nebivolol (BYSTOLIC) 2.5 MG tablet Take 1 tablet (2.5 mg total) by mouth daily. (Patient not taking: Reported on 02/05/2017) 30 tablet 5   No current facility-administered medications for this visit.    Allergies  Allergen Reactions  . Influenza Vaccines Rash       Objective:  BP 123/83   Pulse (!) 105   Wt 167 lb (75.8 kg)   BMI 29.58 kg/m  Gen: well-groomed, cooperative, not ill-appearing, no distress HEENT: normocephalic, atraumatic, normal conjunctiva, no frontal or maxillary sinus tenderness, neck supple Pulm: Normal work of breathing, normal phonation, clear to auscultation bilaterally, no wheezes, rales or rhonchi CV: Tachycardic, regular rhythm, s1 and s2 distinct, no murmurs, clicks or rubs  Neuro: alert and oriented x 3, cranial nerves II-XII intact, normal finger-to-nose, normal heel-to-shin, normal coordination MSK: strength 5/5 and symmetric in bilateral upper and lower extremities, normal gait and station Skin: warm and dry, no rashes or lesions on exposed skin, no cyanosis Psych: normal affect, pleasant mood, normal speech and vocabulary, normal thought content   No results found for this or any previous visit (from the past 72 hour(s)). No results found.  Assessment and Plan: 41 y.o. female with  1. New daily persistent headache - reassuring neurologic exam with symptoms consistent with migraine/tension headache. Ordering imaging because new onset daily persistent headache in patient >40 years - MR BRAIN W WO CONTRAST - patient given IM migraine cocktail in clinic today and felt improved - starting migraine prophylaxis with Topamax 50mg  nightly - starting abortive therapy with Imitrex 100mg , Metocloprmamide 10mg  - headache diary to identify triggers  - SUMAtriptan (IMITREX) injection 6 mg; Inject 0.5 mLs (6 mg total) into the skin once. - dexamethasone (DECADRON) injection 4 mg; Inject 1 mL (4 mg total) into the  muscle once. - metoCLOPramide (REGLAN) 10 mg in dextrose 5 % 50 mL IVPB; Inject 10 mg into the muscle once. - SUMAtriptan (IMITREX) 100 MG tablet; Take 1 tablet (100 mg total) by mouth once as needed for migraine. May repeat in 2 hours if headache persists or recurs.  Dispense: 10 tablet; Refill: 0 - metoCLOPramide (REGLAN) 10 MG tablet; 1 tab PO TID prn Nausea  Dispense: 30 tablet; Refill: 3 - topiramate (TOPAMAX) 50 MG tablet; One half tab by mouth daily for a week, then one tab by mouth daily.  Dispense: 30 tablet; Refill: 3  2. B12 deficiency - cyanocobalamin ((VITAMIN B-12)) injection 1,000 mcg; Inject 1 mL (1,000 mcg total) into the muscle once.  3. Sinus tachycardia - No PSVT or Afib on ECG or on continuous cardiac event monitoring in January - patient has not filled her prescription for Bystolic yet due to cost. She will not take Metoprolol due to fatigue/drowsiness - ordered TSH and free T4 on 2/14 but patient has not yet had them drawn  Patient education and anticipatory guidance given Patient agrees with treatment plan Follow-up in 1 month or sooner as needed  Darlyne Russian PA-C

## 2017-02-06 ENCOUNTER — Encounter: Payer: Self-pay | Admitting: Physician Assistant

## 2017-02-06 DIAGNOSIS — R000 Tachycardia, unspecified: Secondary | ICD-10-CM | POA: Insufficient documentation

## 2017-02-06 MED FILL — BYSTOLIC 2.5 MG TABLET: 2.5 | 30 days supply | Qty: 30 | Fill #0

## 2017-02-06 MED FILL — VIT D2 1.25 MG (50,000 UNIT: 1.25 MG | 56 days supply | Qty: 8 | Fill #0

## 2017-02-12 ENCOUNTER — Other Ambulatory Visit: Payer: Self-pay | Admitting: Physician Assistant

## 2017-02-12 DIAGNOSIS — R209 Unspecified disturbances of skin sensation: Secondary | ICD-10-CM

## 2017-02-18 ENCOUNTER — Ambulatory Visit (HOSPITAL_COMMUNITY)
Admission: RE | Admit: 2017-02-18 | Discharge: 2017-02-18 | Disposition: A | Discharge status: Home / Self Care | Payer: 59 | Source: Ambulatory Visit | Attending: Cardiovascular Disease | Admitting: Cardiovascular Disease

## 2017-02-18 ENCOUNTER — Telehealth: Payer: Self-pay

## 2017-02-18 DIAGNOSIS — R200 Anesthesia of skin: Secondary | ICD-10-CM | POA: Diagnosis not present

## 2017-02-18 DIAGNOSIS — R209 Unspecified disturbances of skin sensation: Secondary | ICD-10-CM | POA: Insufficient documentation

## 2017-02-18 DIAGNOSIS — Z720 Tobacco use: Secondary | ICD-10-CM | POA: Insufficient documentation

## 2017-02-18 DIAGNOSIS — R202 Paresthesia of skin: Secondary | ICD-10-CM | POA: Diagnosis not present

## 2017-02-18 NOTE — Telephone Encounter (Signed)
Pt asked about her neurology referral. Please advise.

## 2017-02-18 NOTE — Telephone Encounter (Signed)
We will wait until we have the result of her ankle-brachial index. If that provides an explanation then there is no need to do further testing. If that is normal, then we can discuss ordering nerve conduction study and electromyography of the lower extremities

## 2017-02-19 ENCOUNTER — Ambulatory Visit (INDEPENDENT_AMBULATORY_CARE_PROVIDER_SITE_OTHER): Payer: 59 | Admitting: Physician Assistant

## 2017-02-19 VITALS — BP 119/74 | HR 98 | Wt 167.0 lb

## 2017-02-19 DIAGNOSIS — R209 Unspecified disturbances of skin sensation: Secondary | ICD-10-CM | POA: Diagnosis not present

## 2017-02-19 NOTE — Progress Notes (Signed)
HPI:                                                                Kelsey Vaughan is a 41 y.o. female who presents to [place]: [place] today for bilateral cold feet  Patient with history of B12 deficiency continues to c/o b/l cold feet. This has been going on for a couple of years, but patient feels it is worsening. Denies claudication, paresthesias, pain, or weakness but states sometimes the 4th and 5th toes will feel numb. Also endorses color change on the bottoms of her feet where skin appears mottled. Worsened by cold temperatures and walking in her house without socks/shoes on. She had ABI and venous dopplers performed yesterday which were negative. Hemoglobin A1C within normal limits. Patient is a current everyday smoker.  Past Medical History:  Diagnosis Date  . Anxiety   . B12 deficiency   . Foreign body in foot, left 07/2013  . Irregular menses    due to oral contraceptive  . Palpitations   . Sinus tachycardia    Past Surgical History:  Procedure Laterality Date  . CESAREAN SECTION    . FOREIGN BODY REMOVAL Left 08/05/2013   Procedure: LEFT FOOT FOREIGN BODY REMOVAL ADULT;  Surgeon: Wylene Simmer, MD;  Location: [place];  Service: Orthopedics;  Laterality: Left;  . SHOULDER ARTHROSCOPY Left    Social History  Substance Use Topics  . Smoking status: Current Every Day Smoker    Years: 5.00    Types: Cigarettes  . Smokeless tobacco: Never Used     Comment: 4-5 cigs/day  . Alcohol use Yes     Comment: occasionally   family history is not on file.  ROS: negative except as noted in the HPI  Medications: Current Outpatient Prescriptions  Medication Sig Dispense Refill  . metoCLOPramide (REGLAN) 10 MG tablet 1 tab PO TID prn Nausea 30 tablet 3  . nebivolol (BYSTOLIC) 2.5 MG tablet Take 1 tablet (2.5 mg total) by mouth daily. (Patient not taking: Reported on 02/05/2017) 30 tablet 5  . norethindrone (ORTHO  MICRONOR) 0.35 MG tablet Take 1 tablet (0.35 mg total) by mouth daily. 1 Package 11  . SUMAtriptan (IMITREX) 100 MG tablet Take 1 tablet (100 mg total) by mouth once as needed for migraine. May repeat in 2 hours if headache persists or recurs. 10 tablet 0  . topiramate (TOPAMAX) 50 MG tablet One half tab by mouth daily for a week, then one tab by mouth daily. 30 tablet 3  . valACYclovir (VALTREX) 1000 MG tablet Take 1 tablet (1,000 mg total) by mouth 2 (two) times daily. 14 tablet 11  . Vitamin D, Ergocalciferol, (DRISDOL) 50000 units CAPS capsule Take 1 capsule (50,000 Units total) by mouth every 7 (seven) days. Take for 8 total doses(weeks) 8 capsule 0   No current facility-administered medications for this visit.    Allergies  Allergen Reactions  . Influenza Vaccines Rash       Objective:  BP 119/74   Pulse 98   Wt 167 lb (75.8 kg)   BMI 29.58 kg/m  Gen: well-groomed, cooperative, not ill-appearing, no distress Extremities: calves nontender, DP and PT pulses intact, feet are warm, normal capillary refill Skin: warm and  dry, no rashes or lesions on exposed skin, no cyanosis   No results found for this or any previous visit (from the past 72 hour(s)). No results found.    Assessment and Plan: 41 y.o. female with   Bilateral cold feet - discussed option to proceed with electromyography and nerve conduction studies. Patient would like to defer testing at this time - cont B12 injections. Recheck serum B12 in 5 months - wear socks/slippers at home. Warm feet with warm water  Patient education and anticipatory guidance given Patient agrees with treatment plan Follow-up as needed if symptoms worsen or fail to improve  Darlyne Russian PA-C

## 2017-02-19 NOTE — Telephone Encounter (Signed)
Pt.notified

## 2017-02-20 NOTE — Progress Notes (Signed)
   Subjective:    Patient ID: Kelsey Vaughan, female    DOB: 1976-08-13, 41 y.o.   MRN: [id number]  HPI 41 yo female comes in today with pain over left great toe. She has had recurrent ingrown toenail. Had the nail removed several months ago. Now that it is growing back in the skin is folding over the edges again. She has been trying to clip the extra skin but that is just causing more pain and irritation.  She would like more definitive tx.     Review of Systems     Objective:   Physical Exam  Constitutional: She is oriented to person, place, and time. She appears well-developed and well-nourished.  HENT:  Head: Normocephalic and atraumatic.  Eyes: Conjunctivae and EOM are normal.  Cardiovascular: Normal rate.   Pulmonary/Chest: Effort normal.  Musculoskeletal:  Partial regrowth of nail on left great toe. Skin is folding over edges with some mild erythema. No induration or drainage.   Neurological: She is alert and oriented to person, place, and time.  Skin: Skin is dry. No pallor.  Psychiatric: She has a normal mood and affect. Her behavior is normal.  Vitals reviewed.         Assessment & Plan:  Ingrown great toenail - discussed treatment options. Discussed more definitive tx by using cautery on the edge to narrow the nail growth. Pt decided to schedule partial removal for later date.  Recommend epsom salt soaks for now and gentlly massaging skin back form edges. Avoid clipping of skin.

## 2017-02-21 ENCOUNTER — Ambulatory Visit
Admission: RE | Admit: 2017-02-21 | Discharge: 2017-02-21 | Disposition: A | Discharge status: Home / Self Care | Payer: 59 | Source: Ambulatory Visit | Attending: Physician Assistant | Admitting: Physician Assistant

## 2017-02-21 ENCOUNTER — Ambulatory Visit (INDEPENDENT_AMBULATORY_CARE_PROVIDER_SITE_OTHER): Payer: 59 | Admitting: Family Medicine

## 2017-02-21 VITALS — BP 117/71 | HR 112 | Ht 63.0 in | Wt 162.0 lb

## 2017-02-21 DIAGNOSIS — Z1231 Encounter for screening mammogram for malignant neoplasm of breast: Secondary | ICD-10-CM | POA: Diagnosis not present

## 2017-02-21 DIAGNOSIS — L600 Ingrowing nail: Secondary | ICD-10-CM | POA: Diagnosis not present

## 2017-02-24 ENCOUNTER — Ambulatory Visit: Payer: 59 | Admitting: Family Medicine

## 2017-02-26 ENCOUNTER — Ambulatory Visit (INDEPENDENT_AMBULATORY_CARE_PROVIDER_SITE_OTHER): Payer: 59 | Admitting: Physician Assistant

## 2017-02-26 ENCOUNTER — Encounter: Payer: Self-pay | Admitting: Physician Assistant

## 2017-02-26 DIAGNOSIS — D259 Leiomyoma of uterus, unspecified: Secondary | ICD-10-CM | POA: Insufficient documentation

## 2017-02-26 DIAGNOSIS — D252 Subserosal leiomyoma of uterus: Secondary | ICD-10-CM | POA: Diagnosis not present

## 2017-02-26 DIAGNOSIS — N281 Cyst of kidney, acquired: Secondary | ICD-10-CM

## 2017-02-26 NOTE — Progress Notes (Addendum)
HPI:                                                                Kelsey Vaughan is a 41 y.o. female who presents to [facility]: [place] today to review MyChart results  Patient wanted to review results from CT Abdomen/Pelvis from 02/29/2016 ordered by another provider. Patient is concerned about some of the findings, specifically (1) a 1.2 cm simple cyst adjacent to the inferior right pulmonary vein, (2) a 0.4 cm hypodense renal cortical lesion in the inferior right kidney, too small to characterize, for which no further follow-up is required and (3) multiple small uterine fibroids, largest a subserosal 1.8 cm fibroid in the posterior upper uterine body.  Patient states she has heavy periods and dysmenorrhea despite being on progesterone. She was previously on COC's and also had symptoms. She states she cannot take NSAID's per nephrology due to hematuria.  Patient has chronic microscopic hematuria and has been evaluated by urology and nephrology this year. Nephrology thinks there may be IgA nephropathy, but plans to continue observation and postpone renal biopsy since patient's labs and kidney function are wnl. Patient had a negative cystoscopy in 12/30/2016 and negative renal U/S 12/23/2016. She is very concerned about the lesion on her right kidney and thinks this may be the cause of her hematuria. She denies any other urinary symptoms.  Past Medical History:  Diagnosis Date  . Anxiety   . B12 deficiency   . Foreign body in foot, left 07/2013  . Irregular menses    due to oral contraceptive  . Palpitations   . Sinus tachycardia    Past Surgical History:  Procedure Laterality Date  . CESAREAN SECTION    . FOREIGN BODY REMOVAL Left 08/05/2013   Procedure: LEFT FOOT FOREIGN BODY REMOVAL ADULT;  Surgeon: Wylene Simmer, MD;  Location: [facility];  Service: Orthopedics;  Laterality: Left;  . SHOULDER ARTHROSCOPY Left   . TUBAL LIGATION      Social History  Substance Use Topics  . Smoking status: Current Every Day Smoker    Years: 5.00    Types: Cigarettes  . Smokeless tobacco: Never Used     Comment: 4-5 cigs/day  . Alcohol use Yes     Comment: occasionally   family history is not on file.  ROS: negative except as noted in the HPI  Medications: Current Outpatient Prescriptions  Medication Sig Dispense Refill  . metoCLOPramide (REGLAN) 10 MG tablet 1 tab PO TID prn Nausea 30 tablet 3  . nebivolol (BYSTOLIC) 2.5 MG tablet Take 1 tablet (2.5 mg total) by mouth daily. 30 tablet 5  . norethindrone (ORTHO MICRONOR) 0.35 MG tablet Take 1 tablet (0.35 mg total) by mouth daily. 1 Package 11  . SUMAtriptan (IMITREX) 100 MG tablet Take 1 tablet (100 mg total) by mouth once as needed for migraine. May repeat in 2 hours if headache persists or recurs. 10 tablet 0  . valACYclovir (VALTREX) 1000 MG tablet Take 1 tablet (1,000 mg total) by mouth 2 (two) times daily. 14 tablet 11  . Vitamin D, Ergocalciferol, (DRISDOL) 50000 units CAPS capsule Take 1 capsule (50,000 Units total) by mouth every 7 (seven) days. Take for 8 total doses(weeks) 8 capsule 0  No current facility-administered medications for this visit.    Allergies  Allergen Reactions  . Influenza Vaccines Rash       Objective:  LMP  (LMP Unknown) Comment: tubal ligation Gen: well-groomed, cooperative, not ill-appearing, no distress Pulm: Normal work of breathing, normal phonation Neuro: alert and oriented x 3, EOM's intact MSK: normal gait and station Psych: appropriate affect, euthymic mood, normal speech and thought content   Assessment and Plan: 41 y.o. female with   1. Subserous leiomyoma of uterus - US Pelvis Complete; Future - discussed that hormones have limited efficacy in managing symptoms. Should symptoms worsen, she should consider referral to gynecology to discuss surgical options  2. Renal cyst, right - CT RENAL ABD W/WO; Future -  radiologist did not recommend further imaging, but given patient's concern and history of renal symptoms, will order renal CT to ensure lesion has not changed in size or appearance  Patient education and anticipatory guidance given Patient agrees with treatment plan Follow-up in 2 weeks or sooner as needed   Darlyne Russian PA-C

## 2017-02-28 ENCOUNTER — Other Ambulatory Visit: Payer: 59

## 2017-03-05 ENCOUNTER — Ambulatory Visit (INDEPENDENT_AMBULATORY_CARE_PROVIDER_SITE_OTHER): Payer: 59 | Admitting: Physician Assistant

## 2017-03-05 VITALS — BP 122/65 | HR 86

## 2017-03-05 DIAGNOSIS — E538 Deficiency of other specified B group vitamins: Secondary | ICD-10-CM

## 2017-03-05 MED ORDER — CYANOCOBALAMIN 1000 MCG/ML IJ SOLN
1000.0000 ug | Freq: Once | INTRAMUSCULAR | Status: AC
  Administered 2017-03-05: 1000 ug via INTRAMUSCULAR

## 2017-03-05 NOTE — Progress Notes (Signed)
Patient was seen in clinic today for monthly B12 injection. Patient tolerated injection in right deltoid well, no immediate complications. Will set up next appt for 4 weeks.

## 2017-03-06 ENCOUNTER — Encounter: Payer: Self-pay | Admitting: Physician Assistant

## 2017-03-06 DIAGNOSIS — R002 Palpitations: Secondary | ICD-10-CM

## 2017-03-06 DIAGNOSIS — I491 Atrial premature depolarization: Secondary | ICD-10-CM

## 2017-03-06 DIAGNOSIS — R000 Tachycardia, unspecified: Secondary | ICD-10-CM

## 2017-03-06 DIAGNOSIS — I479 Paroxysmal tachycardia, unspecified: Secondary | ICD-10-CM

## 2017-03-13 ENCOUNTER — Other Ambulatory Visit: Payer: Self-pay | Admitting: Physician Assistant

## 2017-03-13 DIAGNOSIS — E559 Vitamin D deficiency, unspecified: Secondary | ICD-10-CM

## 2017-03-19 ENCOUNTER — Telehealth: Payer: Self-pay | Admitting: *Deleted

## 2017-03-19 ENCOUNTER — Ambulatory Visit (INDEPENDENT_AMBULATORY_CARE_PROVIDER_SITE_OTHER): Payer: 59 | Admitting: Physician Assistant

## 2017-03-19 ENCOUNTER — Encounter: Payer: Self-pay | Admitting: Physician Assistant

## 2017-03-19 VITALS — BP 102/66 | HR 111 | Ht 62.0 in | Wt 172.0 lb

## 2017-03-19 DIAGNOSIS — I479 Paroxysmal tachycardia, unspecified: Secondary | ICD-10-CM

## 2017-03-19 DIAGNOSIS — E6609 Other obesity due to excess calories: Secondary | ICD-10-CM | POA: Diagnosis not present

## 2017-03-19 DIAGNOSIS — I491 Atrial premature depolarization: Secondary | ICD-10-CM | POA: Diagnosis not present

## 2017-03-19 DIAGNOSIS — E66811 Obesity, class 1: Secondary | ICD-10-CM

## 2017-03-19 MED ORDER — LORCASERIN HCL 10 MG PO TABS: ORAL_TABLET | ORAL | 2 refills | Status: DC

## 2017-03-19 NOTE — Telephone Encounter (Signed)
Pre Authorization sent to cover my meds. [id number]

## 2017-03-19 NOTE — Progress Notes (Signed)
   Subjective:    Patient ID: Kelsey Vaughan, female    DOB: 10/18/1976, 41 y.o.   MRN: [id number]  HPI  Pt is a 41 yo female who presents to the clinic to discuss weight.  BMI today is just at 30. She had done really well in the past with phentermine and lost about 20lbs. unfortunately in the the last month she has been having some palpitations and tachycardia. She was started on bystolic for PAC's and sinus tachycardia. Pt admits she is drinking 2-3 sodas a day and eating a lot throughout the day.   Review of Systems  All other systems reviewed and are negative.      Objective:   Physical Exam  Constitutional: She is oriented to person, place, and time. She appears well-developed and well-nourished.  HENT:  Head: Normocephalic and atraumatic.  Cardiovascular: Normal rate, regular rhythm and normal heart sounds.   Pulmonary/Chest: Effort normal and breath sounds normal.  Neurological: She is alert and oriented to person, place, and time.  Psychiatric: She has a normal mood and affect. Her behavior is normal.          Assessment & Plan:  [name] KitchenMarland KitchenKasiya was seen today for abnormal weight gain.  Diagnoses and all orders for this visit:  Class 1 obesity due to excess calories without serious comorbidity in adult, unspecified BMI -     Lorcaserin HCl 10 MG TABS; Take one tablet for twice a day.  Tachycardia, paroxysmal (HCC)  PAC (premature atrial contraction)  on beta blocker for PAC/tachycardia.  Discussed weight loss options.  Failed topamax. Declined wellbutrin.  Will try belviq. Discussed side effects She request phentermine. Discussed due to her new diagnoses and risk factors or medications would need to get cardiologist approval.  Discussed 150 minutes of exercise a week.  Discussed 1200-1500 calorie diet.  Follow up with PCP in 1-2 months.

## 2017-03-21 ENCOUNTER — Other Ambulatory Visit: Payer: Self-pay | Admitting: Physician Assistant

## 2017-03-21 ENCOUNTER — Ambulatory Visit (INDEPENDENT_AMBULATORY_CARE_PROVIDER_SITE_OTHER): Payer: 59 | Admitting: Physician Assistant

## 2017-03-21 ENCOUNTER — Ambulatory Visit (HOSPITAL_BASED_OUTPATIENT_CLINIC_OR_DEPARTMENT_OTHER)
Admission: RE | Admit: 2017-03-21 | Discharge: 2017-03-21 | Disposition: A | Discharge status: Home / Self Care | Payer: 59 | Source: Ambulatory Visit | Attending: Physician Assistant | Admitting: Physician Assistant

## 2017-03-21 VITALS — BP 118/81 | HR 92 | Wt 172.0 lb

## 2017-03-21 DIAGNOSIS — E559 Vitamin D deficiency, unspecified: Secondary | ICD-10-CM | POA: Diagnosis not present

## 2017-03-21 DIAGNOSIS — E0789 Other specified disorders of thyroid: Secondary | ICD-10-CM | POA: Diagnosis not present

## 2017-03-21 DIAGNOSIS — M542 Cervicalgia: Secondary | ICD-10-CM | POA: Diagnosis not present

## 2017-03-21 DIAGNOSIS — E042 Nontoxic multinodular goiter: Secondary | ICD-10-CM | POA: Diagnosis not present

## 2017-03-21 LAB — T4, FREE: FREE T4: 1 ng/dL (ref 0.8–1.8)

## 2017-03-21 LAB — TSH: TSH: 1.74 mIU/L

## 2017-03-21 MED FILL — BYSTOLIC 2.5 MG TABLET: 2.5 | 30 days supply | Qty: 30 | Fill #1

## 2017-03-21 NOTE — Progress Notes (Signed)
HPI:                                                                Kelsey Vaughan is a 41 y.o. female who presents to [facility]: [place] today for neck pain  Patient reports anterior neck pain x 1 day. Pain is worse on the right. Describes it as tender and she thinks she feels a nodule. Denies sore throat, dysphagia. Denies fever, chills, malaise, congestion, or respiratory symptoms.  Past Medical History:  Diagnosis Date  . Anxiety   . B12 deficiency   . Foreign body in foot, left 07/2013  . Irregular menses    due to oral contraceptive  . Palpitations   . Sinus tachycardia    Past Surgical History:  Procedure Laterality Date  . CESAREAN SECTION    . FOREIGN BODY REMOVAL Left 08/05/2013   Procedure: LEFT FOOT FOREIGN BODY REMOVAL ADULT;  Surgeon: Wylene Simmer, MD;  Location: [facility];  Service: Orthopedics;  Laterality: Left;  . SHOULDER ARTHROSCOPY Left   . TUBAL LIGATION     Social History  Substance Use Topics  . Smoking status: Current Every Day Smoker    Years: 5.00    Types: Cigarettes  . Smokeless tobacco: Never Used     Comment: 4-5 cigs/day  . Alcohol use Yes     Comment: occasionally   family history is not on file.  ROS: negative except as noted in the HPI  Medications: Current Outpatient Prescriptions  Medication Sig Dispense Refill  . Lorcaserin HCl 10 MG TABS Take one tablet for twice a day. 60 tablet 2  . metoCLOPramide (REGLAN) 10 MG tablet 1 tab PO TID prn Nausea 30 tablet 3  . nebivolol (BYSTOLIC) 2.5 MG tablet Take 1 tablet (2.5 mg total) by mouth daily. 30 tablet 5  . norethindrone (ORTHO MICRONOR) 0.35 MG tablet Take 1 tablet (0.35 mg total) by mouth daily. 1 Package 11  . SUMAtriptan (IMITREX) 100 MG tablet Take 1 tablet (100 mg total) by mouth once as needed for migraine. May repeat in 2 hours if headache persists or recurs. 10 tablet 0  . valACYclovir (VALTREX) 1000 MG tablet Take 1  tablet (1,000 mg total) by mouth 2 (two) times daily. (Patient taking differently: Take 1,000 mg by mouth as needed. ) 14 tablet 11   No current facility-administered medications for this visit.    Allergies  Allergen Reactions  . Influenza Vaccines Rash       Objective:  BP 118/81   Pulse 92   Wt 172 lb (78 kg)   LMP  (LMP Unknown) Comment: tubal ligation  BMI 31.46 kg/m  Gen: well-groomed, cooperative, not ill-appearing, no distress HEENT: normal conjunctiva, TM's clear, nasal mucosa pink, oropharynx clear, moist mucus membranes, anterior right-sided neck tenderness in the jugulodigastric region, thyroid tender especially right lobe, no palpable nodule, no thyromegaly Pulm: Normal work of breathing, normal phonation, clear to auscultation bilaterally, no wheezes, rales or rhonchi CV: Normal rate, regular rhythm, s1 and s2 distinct, no murmurs, clicks or rubs, no carotid Neuro: alert and oriented x 3, EOM's intact Lymph: no cervical or tonsillar adenopathy Skin: warm and dry, no rashes or lesions on exposed skin, no cyanosis   No  results found for this or any previous visit (from the past 93 hour(s)). No results found.    Assessment and Plan: 41 y.o. female with   1. Painful thyroid - TSH, free T4 pending - thyroid ultrasound pending  2. Anterior neck pain - likely a shotty anterior cervical lymph node based on exam. Ruling out thyroid pathology.  - follow-up in 2 weeks  Patient education and anticipatory guidance given Patient agrees with treatment plan Follow-up in 2 weeks or sooner as needed   Darlyne Russian PA-C

## 2017-03-22 LAB — VITAMIN D 25 HYDROXY (VIT D DEFICIENCY, FRACTURES): Vit D, 25-Hydroxy: 40 ng/mL (ref 30–100)

## 2017-03-28 NOTE — Telephone Encounter (Signed)
belviq has been approved. Letter sent to scan

## 2017-04-01 MED FILL — BELVIQ 10 MG TABLET: 10 | 30 days supply | Qty: 60 | Fill #0

## 2017-04-04 ENCOUNTER — Ambulatory Visit (INDEPENDENT_AMBULATORY_CARE_PROVIDER_SITE_OTHER): Payer: 59 | Admitting: Physician Assistant

## 2017-04-04 VITALS — BP 111/63 | HR 94 | Wt 172.0 lb

## 2017-04-04 DIAGNOSIS — E538 Deficiency of other specified B group vitamins: Secondary | ICD-10-CM

## 2017-04-04 MED ORDER — CYANOCOBALAMIN 1000 MCG/ML IJ SOLN
1000.0000 ug | Freq: Once | INTRAMUSCULAR | Status: AC
  Administered 2017-04-04: 1000 ug via INTRAMUSCULAR

## 2017-04-04 NOTE — Progress Notes (Signed)
Patient was seen in clinic today for monthly B12 injection. Patient reports no medication side effects or complications. Patient tolerated injection in right deltoid well, no immediate complications. Will set up next appt for 4 weeks.

## 2017-04-07 ENCOUNTER — Encounter: Payer: Self-pay | Admitting: Physician Assistant

## 2017-04-07 ENCOUNTER — Ambulatory Visit (INDEPENDENT_AMBULATORY_CARE_PROVIDER_SITE_OTHER): Payer: 59 | Admitting: Physician Assistant

## 2017-04-07 VITALS — BP 116/66 | HR 82 | Ht 62.0 in | Wt 173.0 lb

## 2017-04-07 DIAGNOSIS — J309 Allergic rhinitis, unspecified: Secondary | ICD-10-CM

## 2017-04-07 DIAGNOSIS — H6982 Other specified disorders of Eustachian tube, left ear: Secondary | ICD-10-CM | POA: Diagnosis not present

## 2017-04-07 DIAGNOSIS — J329 Chronic sinusitis, unspecified: Secondary | ICD-10-CM | POA: Insufficient documentation

## 2017-04-07 DIAGNOSIS — E6609 Other obesity due to excess calories: Secondary | ICD-10-CM

## 2017-04-07 DIAGNOSIS — Z6831 Body mass index (BMI) 31.0-31.9, adult: Principal | ICD-10-CM

## 2017-04-07 MED ORDER — METHYLPREDNISOLONE SODIUM SUCC 125 MG IJ SOLR
125.0000 mg | Freq: Once | INTRAMUSCULAR | Status: AC
  Administered 2017-04-07: 125 mg via INTRAMUSCULAR

## 2017-04-07 MED ORDER — FEXOFENADINE-PSEUDOEPHED ER 180-240 MG PO TB24
1.0000 | ORAL_TABLET | Freq: Every day | ORAL | 3 refills | Status: DC
Start: 2017-04-07 — End: 2017-04-22

## 2017-04-07 NOTE — Telephone Encounter (Signed)
Patient referred to Dr. Dennard Nip for weight loss consultation per patient's request

## 2017-04-07 NOTE — Patient Instructions (Signed)
Start Kelsey Vaughan.

## 2017-04-07 NOTE — Progress Notes (Signed)
   Subjective:    Patient ID: Kelsey Vaughan, female    DOB: 05/23/1976, 41 y.o.   MRN: [id number]  HPI Pt is a 41 yo female who presents to the clinic with sinus congestion, facial pain, popping ears for last 3 weeks. She woke up this morning with some left ear pain. She denies any fever, chills, SOB, cough. She does have some clear to yellow nasal drainage. She does admit to her throat being sore. She has noticed she has some itching watery eyes and sneezing more. She is on allegra daily.    Review of Systems    see HPI.  Objective:   Physical Exam  Constitutional: She is oriented to person, place, and time. She appears well-developed and well-nourished.  HENT:  Head: Normocephalic and atraumatic.  Right Ear: External ear normal.  Left Ear: External ear normal.  Left TM with some air bubbles.  PND on oropharynx.  Bilateral nares with turbinates that are red and swollen.  Tenderness over maxillary sinuses to palpation.   Eyes:  Slightly injected conjunctiva with watery discharge.   Neck: Normal range of motion. Neck supple.  Cardiovascular: Normal rate, regular rhythm and normal heart sounds.   Pulmonary/Chest: Effort normal and breath sounds normal.  Lymphadenopathy:    She has no cervical adenopathy.  Neurological: She is alert and oriented to person, place, and time.  Psychiatric: She has a normal mood and affect. Her behavior is normal.          Assessment & Plan:  [name] KitchenMarland KitchenDiagnoses and all orders for this visit:  Allergic sinusitis -     fexofenadine-pseudoephedrine (ALLEGRA-D 24) 180-240 MG 24 hr tablet; Take 1 tablet by mouth daily. -     methylPREDNISolone sodium succinate (SOLU-MEDROL) 125 mg/2 mL injection 125 mg; Inject 2 mLs (125 mg total) into the muscle once.  ETD (Eustachian tube dysfunction), left -     methylPREDNISolone sodium succinate (SOLU-MEDROL) 125 mg/2 mL injection 125 mg; Inject 2 mLs (125 mg total) into the muscle once.   Reassured patient saw no  signs of infection.  Seems to be allergic.  Solumedrol given today.  Switch to Allegra D for pollen season.  Consider flonase 2 sprays as needed daily.

## 2017-04-15 ENCOUNTER — Ambulatory Visit (INDEPENDENT_AMBULATORY_CARE_PROVIDER_SITE_OTHER): Payer: 59

## 2017-04-15 ENCOUNTER — Ambulatory Visit (INDEPENDENT_AMBULATORY_CARE_PROVIDER_SITE_OTHER): Payer: 59 | Admitting: Physician Assistant

## 2017-04-15 VITALS — BP 107/69 | HR 79 | Ht 62.0 in | Wt 173.0 lb

## 2017-04-15 DIAGNOSIS — R8299 Other abnormal findings in urine: Secondary | ICD-10-CM | POA: Diagnosis not present

## 2017-04-15 DIAGNOSIS — R109 Unspecified abdominal pain: Secondary | ICD-10-CM

## 2017-04-15 DIAGNOSIS — K5900 Constipation, unspecified: Secondary | ICD-10-CM

## 2017-04-15 DIAGNOSIS — R1013 Epigastric pain: Secondary | ICD-10-CM | POA: Diagnosis not present

## 2017-04-15 DIAGNOSIS — R319 Hematuria, unspecified: Secondary | ICD-10-CM

## 2017-04-15 DIAGNOSIS — R82998 Other abnormal findings in urine: Secondary | ICD-10-CM

## 2017-04-15 LAB — POCT URINALYSIS DIPSTICK
Bilirubin, UA: NEGATIVE
GLUCOSE UA: NEGATIVE
KETONES UA: NEGATIVE
Nitrite, UA: NEGATIVE
PROTEIN UA: 100
SPEC GRAV UA: 1.02 (ref 1.010–1.025)
UROBILINOGEN UA: 2 U/dL — AB
pH, UA: 7.5 (ref 5.0–8.0)

## 2017-04-15 MED ORDER — DICYCLOMINE HCL 10 MG PO CAPS: 10.0000 mg | ORAL_CAPSULE | Freq: Three times a day (TID) | ORAL | 1 refills | Status: DC

## 2017-04-15 MED ORDER — CIPROFLOXACIN HCL 500 MG PO TABS: 500.0000 mg | ORAL_TABLET | Freq: Two times a day (BID) | ORAL | 0 refills | Status: DC

## 2017-04-15 MED ORDER — PANTOPRAZOLE SODIUM 40 MG PO TBEC: 40.0000 mg | DELAYED_RELEASE_TABLET | Freq: Every day | ORAL | 3 refills | Status: DC

## 2017-04-15 MED ORDER — POLYETHYLENE GLYCOL 3350 17 GM/SCOOP PO POWD: 17.0000 g | Freq: Two times a day (BID) | ORAL | 1 refills | Status: DC | PRN

## 2017-04-15 MED FILL — PANTOPRAZOLE SOD DR 40 MG T: 40 | 90 days supply | Qty: 90 | Fill #0

## 2017-04-15 MED FILL — DICYCLOMINE 10 MG CAPSULE: 10 | 30 days supply | Qty: 90 | Fill #0

## 2017-04-15 MED FILL — POLYETHYLENE GLYCOL 3350 PO: 16 days supply | Qty: 527 | Fill #0

## 2017-04-15 MED FILL — CIPROFLOXACIN HCL 500 MG TA: 500 | 7 days supply | Qty: 14 | Fill #0

## 2017-04-15 NOTE — Progress Notes (Signed)
Referring-Breeback, Kelsey Car, PA-C Reason for referral-Palpitations/PAF  HPI: 41 yo female for evaluation of palpitations and PAF at request of Kelsey Planas, PA-C. Echo 4/17 ([facility]) normal LV function. ABIs 3/18 normal. TSH 4/18 normal. Pt had episode of atrial fibrillation 3/17. Event monitor 2/18 showed sinus with PVCs. Pt also considering phentermine for weight loss and cardiology asked to evaluate. Patient states that her initial episode of atrial fibrillation in March 2017 occurred with symptoms of heart racing and dizziness. Symptoms lasted 5-10 minutes and recurred the next day and this was captured by electrocardiogram in our [place] office. She has had intermittent palpitations since then. Metoprolol caused fatigue but she was placed on bystolic with improvement in her fatigue and in her palpitations. She has had no recurrent symptoms in the past 2 month period. She has mild dyspnea on exertion which she attributes to tobacco use. No orthopnea or PND. No pedal edema, exertional chest pain or syncope. Because of the above we were asked to evaluate.  Current Outpatient Prescriptions  Medication Sig Dispense Refill  . Cyanocobalamin (B-12 COMPLIANCE INJECTION) 1000 MCG/ML KIT Inject as directed.    . dicyclomine (BENTYL) 10 MG capsule Take 1 capsule (10 mg total) by mouth 3 (three) times daily before meals. (Patient taking differently: Take 10 mg by mouth daily as needed. ) 90 capsule 1  . linaclotide (LINZESS) 290 MCG CAPS capsule Take 1 capsule (290 mcg total) by mouth daily before breakfast. 90 capsule 0  . metoCLOPramide (REGLAN) 10 MG tablet 1 tab PO TID prn Nausea 30 tablet 3  . nebivolol (BYSTOLIC) 2.5 MG tablet Take 1 tablet (2.5 mg total) by mouth daily. 30 tablet 5  . pantoprazole (PROTONIX) 40 MG tablet Take 1 tablet (40 mg total) by mouth daily. 30 tablet 3  . polyethylene glycol powder (GLYCOLAX/MIRALAX) powder Take 17 g by mouth 2 (two) times daily as needed. 3350 g 1  .  SUMAtriptan (IMITREX) 100 MG tablet Take 1 tablet (100 mg total) by mouth once as needed for migraine. May repeat in 2 hours if headache persists or recurs. 10 tablet 0  . valACYclovir (VALTREX) 1000 MG tablet Take 1 tablet (1,000 mg total) by mouth 2 (two) times daily. (Patient taking differently: Take 1,000 mg by mouth as needed. ) 14 tablet 11   No current facility-administered medications for this visit.     Allergies  Allergen Reactions  . Influenza Vaccines Rash     Past Medical History:  Diagnosis Date  . Anxiety   . Atrial fibrillation ([place])   . B12 deficiency   . Foreign body in foot, left 07/2013  . GERD (gastroesophageal reflux disease)   . Hematuria   . Irregular menses    due to oral contraceptive  . Palpitations   . Sinus tachycardia   . Thyroid nodule     Past Surgical History:  Procedure Laterality Date  . CESAREAN SECTION    . FOREIGN BODY REMOVAL Left 08/05/2013   Procedure: LEFT FOOT FOREIGN BODY REMOVAL ADULT;  Surgeon: Wylene Simmer, MD;  Location: [facility];  Service: Orthopedics;  Laterality: Left;  . SHOULDER ARTHROSCOPY Left   . TUBAL LIGATION      Social History   Social History  . Marital status: Divorced    Spouse name: N/A  . Number of children: 3  . Years of education: N/A   Occupational History  . Not on file.   Social History Main Topics  . Smoking status: Current  Every Day Smoker    Years: 5.00    Types: Cigarettes  . Smokeless tobacco: Never Used     Comment: 4-5 cigs/day  . Alcohol use Yes     Comment: occasionally  . Drug use: No  . Sexual activity: Not on file     Comment: tubal ligation   Other Topics Concern  . Not on file   Social History Narrative  . No narrative on file    Family History  Problem Relation Age of Onset  . Diabetes Mother   . CAD Father   . Breast cancer Neg Hx     ROS: no fevers or chills, productive cough, hemoptysis, dysphasia, odynophagia, melena, hematochezia, dysuria,  hematuria, rash, seizure activity, orthopnea, PND, pedal edema, claudication. Remaining systems are negative.  Physical Exam:   Blood pressure 110/66, pulse 89, height '5\' 2"'$  (1.575 m), weight 173 lb (78.5 kg).  General:  Well developed/well nourished in NAD Skin warm/dry Patient not depressed No peripheral clubbing Back-normal HEENT-normal/normal eyelids Neck supple/normal carotid upstroke bilaterally; no bruits; no JVD; no thyromegaly chest - CTA/ normal expansion CV - RRR/normal S1 and S2; no murmurs, rubs or gallops;  PMI nondisplaced Abdomen -NT/ND, no HSM, no mass, + bowel sounds, no bruit 2+ femoral pulses, no bruits Ext-no edema, chords, 2+ DP Neuro-grossly nonfocal  A/P  1 paroxysmal atrial fibrillation-patient is in sinus rhythm on examination today. Her symptoms are much improved on bystolic. We will continue. If she has more frequent episodes in the future we will consider an antiarrhythmic such as flecainide. Her LV function and TSH were normal. CHADSvasc 1 for female sex. We will therefore not anticoagulate long-term. I will add aspirin 81 mg daily.  2 palpitations-patient also states she had some PVCs in the past. This has improved with addition of beta blocker.  3 tobacco abuse-patient counseled on discontinuing.  Kirk Ruths, MD

## 2017-04-15 NOTE — Progress Notes (Signed)
u

## 2017-04-16 LAB — URINE CULTURE: Organism ID, Bacteria: NO GROWTH

## 2017-04-17 ENCOUNTER — Encounter: Payer: Self-pay | Admitting: Physician Assistant

## 2017-04-17 DIAGNOSIS — K5900 Constipation, unspecified: Secondary | ICD-10-CM | POA: Insufficient documentation

## 2017-04-17 DIAGNOSIS — R319 Hematuria, unspecified: Secondary | ICD-10-CM | POA: Insufficient documentation

## 2017-04-17 DIAGNOSIS — R1013 Epigastric pain: Secondary | ICD-10-CM | POA: Insufficient documentation

## 2017-04-17 NOTE — Progress Notes (Signed)
Subjective:    Patient ID: Kelsey Vaughan, female    DOB: [birth year], 41 y.o.   MRN: [id number]  HPI Pt is a 41 yo female who presents to the clinic with epigastric pain discomfort for about a month, bloating and constipation.she also complains of increased urination for last few days.  At times even though she is not having full bowel movements she continues to have periodical loose stools. Denies any gas. No blood in stoool. She is having more burping and reflux but she has this off and on. Denies straining with bowel movement. She did start belivq about one month ago. She is concerned about stomach cancer.    Review of Systems See HPI.     Objective:   Physical Exam  Constitutional: She is oriented to person, place, and time. She appears well-developed and well-nourished.  HENT:  Head: Normocephalic and atraumatic.  Cardiovascular: Normal rate, regular rhythm and normal heart sounds.   Pulmonary/Chest: Effort normal and breath sounds normal.  Abdominal:  Slightly distended abdomen.  Mild tenderness over epigastric region to palpation. No guarding or rebound.   Neurological: She is alert and oriented to person, place, and time.  Psychiatric: She has a normal mood and affect. Her behavior is normal.          Assessment & Plan:  [name] KitchenMarland KitchenDiagnoses and all orders for this visit:  Epigastric abdominal pain -     pantoprazole (PROTONIX) 40 MG tablet; Take 1 tablet (40 mg total) by mouth daily. -     dicyclomine (BENTYL) 10 MG capsule; Take 1 capsule (10 mg total) by mouth 3 (three) times daily before meals. -     DG Abd 1 View -     POCT urinalysis dipstick  Constipation, unspecified constipation type -     DG Abd 1 View -     polyethylene glycol powder (GLYCOLAX/MIRALAX) powder; Take 17 g by mouth 2 (two) times daily as needed.  Hematuria, unspecified type -     ciprofloxacin (CIPRO) 500 MG tablet; Take 1 tablet (500 mg total) by mouth 2 (two) times daily. For 7 days. -      Urine Culture  Leukocytes in urine -     ciprofloxacin (CIPRO) 500 MG tablet; Take 1 tablet (500 mg total) by mouth 2 (two) times daily. For 7 days.   .. Results for orders placed or performed in visit on 04/15/17  Urine Culture  Result Value Ref Range   Organism ID, Bacteria NO GROWTH   POCT urinalysis dipstick  Result Value Ref Range   Color, UA yellow    Clarity, UA clear    Glucose, UA neg    Bilirubin, UA neg    Ketones, UA neg    Spec Grav, UA 1.020 1.010 - 1.025   Blood, UA moderate    pH, UA 7.5 5.0 - 8.0   Protein, UA 100    Urobilinogen, UA 2.0 (A) 0.2 or 1.0 E.U./dL   Nitrite, UA neg    Leukocytes, UA Trace (A) Negative   Started on cipro at time of office visit due to leuks/blood and symptomatic.[name] Kitchen Stopped after culture confirmed no bacteria.  Xray confirmed stool burden. Work to start having good full bowel movements with probiotic every day and miralax regularly until stooling well.  protonix given for reflux symptoms.  Bentyl for abdominal cramping with meals. Certainly could be some underlying IBS.  Reassured patient no overt signs of stomach cancer. We can certainly work this up more if  above medication does not help.

## 2017-04-18 ENCOUNTER — Encounter: Payer: Self-pay | Admitting: Physician Assistant

## 2017-04-18 ENCOUNTER — Ambulatory Visit (INDEPENDENT_AMBULATORY_CARE_PROVIDER_SITE_OTHER): Payer: 59 | Admitting: Physician Assistant

## 2017-04-18 VITALS — BP 103/60 | HR 83 | Ht 62.0 in | Wt 170.0 lb

## 2017-04-18 DIAGNOSIS — K5901 Slow transit constipation: Secondary | ICD-10-CM | POA: Diagnosis not present

## 2017-04-18 MED ORDER — LINACLOTIDE 290 MCG PO CAPS: 290.0000 ug | ORAL_CAPSULE | Freq: Every day | ORAL | 0 refills | Status: DC

## 2017-04-18 MED FILL — LINZESS 290 MCG CAPSULE: 290 | 90 days supply | Qty: 90 | Fill #0

## 2017-04-18 NOTE — Progress Notes (Signed)
   Subjective:    Patient ID: Kelsey Vaughan, female    DOB: July 13, 1976, 41 y.o.   MRN: [id number]  HPI  Pt is a 41 yo female who presents to the clinic to follow up on constipation. Xray showed constipation and she has being doing miralax twice a day and yesterday drank a whole magnesium citrate. She is having bowel movements and feels like stool is slowly moving from epigastric area down in abdomen. She would like to try something different for constipation. She admits as stool leaves her body she feels better and better.      Review of Systems  All other systems reviewed and are negative.      Objective:   Physical Exam  Constitutional: She is oriented to person, place, and time. She appears well-developed and well-nourished.  HENT:  Head: Normocephalic and atraumatic.  Cardiovascular: Normal rate, regular rhythm and normal heart sounds.   Pulmonary/Chest: Effort normal and breath sounds normal.  Abdominal: Soft. Bowel sounds are normal. She exhibits no mass. There is no tenderness. There is no rebound and no guarding.  Less distension noted on exam today. Decreased tenderness over epigastric area.   Neurological: She is alert and oriented to person, place, and time.  Psychiatric: She has a normal mood and affect. Her behavior is normal.          Assessment & Plan:  [name] KitchenMarland KitchenDiagnoses and all orders for this visit:  Slow transit constipation -     linaclotide (LINZESS) 290 MCG CAPS capsule; Take 1 capsule (290 mcg total) by mouth daily before breakfast.   Stop miralax and magnesium citrate. Stay hydrated.    Will recheck B12, vitamin D, lipid panel in 3 months.

## 2017-04-22 ENCOUNTER — Ambulatory Visit (INDEPENDENT_AMBULATORY_CARE_PROVIDER_SITE_OTHER): Payer: 59 | Admitting: Cardiology

## 2017-04-22 ENCOUNTER — Encounter: Payer: Self-pay | Admitting: Cardiology

## 2017-04-22 VITALS — BP 110/66 | HR 89 | Ht 62.0 in | Wt 173.0 lb

## 2017-04-22 DIAGNOSIS — I480 Paroxysmal atrial fibrillation: Secondary | ICD-10-CM

## 2017-04-22 DIAGNOSIS — R002 Palpitations: Secondary | ICD-10-CM

## 2017-04-22 MED ORDER — ASPIRIN EC 81 MG PO TBEC: 81.0000 mg | DELAYED_RELEASE_TABLET | Freq: Every day | ORAL | 3 refills | Status: DC

## 2017-04-22 NOTE — Patient Instructions (Signed)
Medication Instructions:   START ASPIRIN 81 MG ONCE DAILY WITH FOOD  Follow-Up:  Your physician wants you to follow-up in: [place] will receive a reminder letter in the mail two months in advance. If you don't receive a letter, please call our office to schedule the follow-up appointment.   If you need a refill on your cardiac medications before your next appointment, please call your pharmacy.

## 2017-04-23 ENCOUNTER — Encounter: Payer: Self-pay | Admitting: Physician Assistant

## 2017-04-23 ENCOUNTER — Ambulatory Visit (INDEPENDENT_AMBULATORY_CARE_PROVIDER_SITE_OTHER): Payer: 59 | Admitting: Physician Assistant

## 2017-04-23 VITALS — BP 106/51 | HR 72 | Ht 62.0 in | Wt 171.0 lb

## 2017-04-23 DIAGNOSIS — K5904 Chronic idiopathic constipation: Secondary | ICD-10-CM

## 2017-04-23 NOTE — Patient Instructions (Signed)
Will make referral to GI.

## 2017-04-23 NOTE — Progress Notes (Signed)
   Subjective:    Patient ID: Kelsey Vaughan, female    DOB: Oct 26, 1976, 41 y.o.   MRN: [id number]  HPI  Pt is a 41 yo female who presents to the clinic to follow up on constipation. She had abdominal xray which showed moderate stool and correlated to her distention and bloating she had been experiencing. She started miralax but did not seem to have full bowel movement. Started linzess 145 and she started having all water bowel movements at least 4-5 times a day. She has never felted like she got rid of stool in colon. She does have some occasional acid reflux well controlled by medication.    Review of Systems  All other systems reviewed and are negative.      Objective:   Physical Exam  Constitutional: She is oriented to person, place, and time. She appears well-developed and well-nourished.  Cardiovascular: Normal rate, regular rhythm and normal heart sounds.   Pulmonary/Chest: Effort normal and breath sounds normal.  Abdominal:  Less distended as before. No tenderness, rebound or guarding.   Neurological: She is alert and oriented to person, place, and time.  Psychiatric: She has a normal mood and affect. Her behavior is normal.          Assessment & Plan:  [name] KitchenMarland KitchenDiagnoses and all orders for this visit:  Chronic idiopathic constipation -     Ambulatory referral to Gastroenterology   Tried linzess and just watery stools. She continues to feel "constipated". She has still not had full bowel movement. Stop all miralx and linzess and see if stolls can re-form. Referral made to GI.

## 2017-04-24 ENCOUNTER — Ambulatory Visit: Payer: 59 | Admitting: Physician Assistant

## 2017-05-01 DIAGNOSIS — R1310 Dysphagia, unspecified: Secondary | ICD-10-CM | POA: Diagnosis not present

## 2017-05-01 DIAGNOSIS — K5904 Chronic idiopathic constipation: Secondary | ICD-10-CM | POA: Diagnosis not present

## 2017-05-01 DIAGNOSIS — R1084 Generalized abdominal pain: Secondary | ICD-10-CM | POA: Diagnosis not present

## 2017-05-05 DIAGNOSIS — R1310 Dysphagia, unspecified: Secondary | ICD-10-CM | POA: Diagnosis not present

## 2017-05-06 ENCOUNTER — Ambulatory Visit (INDEPENDENT_AMBULATORY_CARE_PROVIDER_SITE_OTHER): Payer: 59 | Admitting: Physician Assistant

## 2017-05-06 VITALS — BP 112/64 | HR 58

## 2017-05-06 DIAGNOSIS — E538 Deficiency of other specified B group vitamins: Secondary | ICD-10-CM

## 2017-05-06 MED ORDER — CYANOCOBALAMIN 1000 MCG/ML IJ SOLN
1000.0000 ug | Freq: Once | INTRAMUSCULAR | Status: AC
  Administered 2017-05-06: 1000 ug via INTRAMUSCULAR

## 2017-05-06 NOTE — Progress Notes (Signed)
Patient was seen in clinic today for monthly B12 injection. Patient reports no medication side effects or complications. Patient tolerated injection in left deltoid well, no immediate complications. Will set up next appt for 4 weeks.

## 2017-05-08 ENCOUNTER — Ambulatory Visit (INDEPENDENT_AMBULATORY_CARE_PROVIDER_SITE_OTHER): Payer: 59 | Admitting: Family Medicine

## 2017-05-08 ENCOUNTER — Encounter: Payer: Self-pay | Admitting: Family Medicine

## 2017-05-08 VITALS — BP 126/63 | HR 60 | Wt 176.0 lb

## 2017-05-08 DIAGNOSIS — J029 Acute pharyngitis, unspecified: Secondary | ICD-10-CM | POA: Diagnosis not present

## 2017-05-08 LAB — POCT RAPID STREP A (OFFICE): Rapid Strep A Screen: NEGATIVE

## 2017-05-08 MED ORDER — MAGIC MOUTHWASH W/LIDOCAINE: 5.0000 mL | Freq: Three times a day (TID) | ORAL | 0 refills | Status: DC | PRN

## 2017-05-08 MED FILL — CMPD-LIDO/DIPH/MYLAN 1:1:1: 10 days supply | Qty: 150 | Fill #0

## 2017-05-08 NOTE — Progress Notes (Signed)
       Subjective:    Patient ID: Kelsey Vaughan, female    DOB: [birth year], 41 y.o.   MRN: [id number]  HPI 41 year old female is in today complaining of low-grade fevers on and off for about 7 days. She just hasn't felt great. But in the last 2 days she started developing a more sore throat. To the point where it's extremely painful to swallow and eat. Her eyes a been watering for about a week. No cough. But she has been getting some clear mucus mostly from her nose with some nasal congestion. She has known history of allergic rhinitis and says that she's been taking her allergy pill consistently. Believe she is taking Allegra-D.   Review of Systems     Objective:   Physical Exam  Constitutional: She is oriented to person, place, and time. She appears well-developed and well-nourished.  HENT:  Head: Normocephalic and atraumatic.  Right Ear: External ear normal.  Left Ear: External ear normal.  Nose: Nose normal.  Mouth/Throat: Oropharynx is clear and moist.  TMs and canals are clear.   Eyes: Conjunctivae and EOM are normal. Pupils are equal, round, and reactive to light.  Neck: Neck supple. No thyromegaly present.  Cardiovascular: Normal rate, regular rhythm and normal heart sounds.   Pulmonary/Chest: Effort normal and breath sounds normal. She has no wheezes.  Lymphadenopathy:    She has no cervical adenopathy.  Neurological: She is alert and oriented to person, place, and time.  Skin: Skin is warm and dry.  Psychiatric: She has a normal mood and affect.          Assessment & Plan:  Viral pharyngitis-encourage to continue with salt water gargles. Also sent her prescription for Magic mouthwash with lidocaine for comfort. Gargle and spit. Recommend Tylenol or ibuprofen for pain and fever relief. If not better after the weekend or feel like she is getting worse and please give [place] a copy.

## 2017-05-08 NOTE — Patient Instructions (Addendum)

## 2017-05-16 ENCOUNTER — Ambulatory Visit: Payer: 59 | Admitting: Physician Assistant

## 2017-05-19 MED FILL — BYSTOLIC 2.5 MG TABLET: 2.5 | 30 days supply | Qty: 30 | Fill #2

## 2017-05-20 ENCOUNTER — Telehealth: Payer: Self-pay | Admitting: Physician Assistant

## 2017-05-20 ENCOUNTER — Other Ambulatory Visit: Payer: Self-pay | Admitting: Physician Assistant

## 2017-05-20 MED ORDER — CYCLOSPORINE 0.05 % OP EMUL: 1.0000 [drp] | Freq: Two times a day (BID) | OPHTHALMIC | 2 refills | Status: DC

## 2017-05-20 MED FILL — RESTASIS MULTIDOSE 0.05% EY: 0.05 | 30 days supply | Qty: 6 | Fill #0

## 2017-05-20 NOTE — Telephone Encounter (Signed)
Pt has a hx of dry eyes. She is going to make appt with eye doctor but would like rx sent to pharmacy for restasis to get back on it. Her eyes are really dry and irritated.

## 2017-05-27 ENCOUNTER — Encounter: Payer: Self-pay | Admitting: Student

## 2017-05-27 ENCOUNTER — Ambulatory Visit (INDEPENDENT_AMBULATORY_CARE_PROVIDER_SITE_OTHER): Payer: 59 | Admitting: Student

## 2017-05-27 ENCOUNTER — Telehealth: Payer: Self-pay | Admitting: Cardiology

## 2017-05-27 VITALS — BP 104/72 | HR 83 | Ht 62.0 in | Wt 175.8 lb

## 2017-05-27 DIAGNOSIS — I493 Ventricular premature depolarization: Secondary | ICD-10-CM | POA: Diagnosis not present

## 2017-05-27 DIAGNOSIS — I480 Paroxysmal atrial fibrillation: Secondary | ICD-10-CM

## 2017-05-27 DIAGNOSIS — R002 Palpitations: Secondary | ICD-10-CM

## 2017-05-27 DIAGNOSIS — F17200 Nicotine dependence, unspecified, uncomplicated: Secondary | ICD-10-CM

## 2017-05-27 NOTE — Telephone Encounter (Signed)
Returned call to patient.She stated she has been having increased palpitations for the past 2 weeks.Stated she saw PCP last Friday and Bystolic was increased to 5 mg daily.Stated she only took 5 mg daily for 2 days,she is back taking 2.5 mg daily.She did not feel that 5 mg helped.Stated PCP advised her to see Dr.Crensahw.Appointment scheduled with Bernerd Pho PA this afternoon at 3:00 pm

## 2017-05-27 NOTE — Progress Notes (Signed)
Cardiology Office Note    Date:  05/27/2017   ID:  Kelsey Vaughan, DOB 24-Aug-1976, MRN [id number]  PCP:  Donella Stade, PA-C  Cardiologist: Dr. Stanford Breed   Chief Complaint  Patient presents with  . Palpitations    pt states some SOB today and some dizziness and feeling light headed     History of Present Illness:    Kelsey Vaughan is a 41 y.o. female with past medical history of PAF, palpitations (thought to be secondary to PVC's), GERD, and tobacco use who presents to the office today for evaluation of palpitations.    She was evaluated by Dr. Stanford Breed on 04/22/2017 as a new-patient referral for palpitations. She had an event monitor in 01/2017 which showed sinus rhythm with PVC's. She has a known history of atrial fibrillation with her initial episode occurring in 02/2016 but no recurrence since. She had previously been on Metoprolol but this caused fatigue therefore she was switched to Bystolic. At the time of her visit, she denied any palpitations over the past several months. She was not placed on anticoagulation due to her low CHADSvasc score of 1 (female sex).  She called the office today reporting worsening palpitations and was therefore added-on for an acute visit.  In talking with the patient today, she reports worsening palpitations over the past 2 weeks. She describes this as her heart "skipping beats" and has not noticed any associated racing. Says this does not resemble what she experienced in 2017. Over the past 2 days she has experienced some dyspnea and dizziness but denies any presyncope or syncope. No associated chest discomfort, orthopnea, PND, or lower extremity edema.  She called her PCP on Friday and was told to increase her Bystolic to 5 mg daily. She done this on Saturday only and says she did not experience significant improvement, therefore she went back to 2.'5mg'$  daily.   Labs, including TSH, were recently checked by her PCP and normal at that time. She  denies any recent medication changes. No changes in her diet or increased emotional stress. She does note that over the past week she has cut out caffeine and is curious if this is contributing to her symptoms. She denies any alcohol use.  Past Medical History:  Diagnosis Date  . Anxiety   . Atrial fibrillation ([place])    a. occuring in 02/2016  . B12 deficiency   . Foreign body in foot, left 07/2013  . GERD (gastroesophageal reflux disease)   . Hematuria   . Irregular menses    due to oral contraceptive  . Palpitations    a. 01/2017: Event monitor showing SR with PVC's.   . Sinus tachycardia   . Thyroid nodule     Past Surgical History:  Procedure Laterality Date  . CESAREAN SECTION    . FOREIGN BODY REMOVAL Left 08/05/2013   Procedure: LEFT FOOT FOREIGN BODY REMOVAL ADULT;  Surgeon: Wylene Simmer, MD;  Location: [facility];  Service: Orthopedics;  Laterality: Left;  . SHOULDER ARTHROSCOPY Left   . TUBAL LIGATION      Current Medications: Outpatient Medications Prior to Visit  Medication Sig Dispense Refill  . aspirin EC 81 MG tablet Take 1 tablet (81 mg total) by mouth daily. 90 tablet 3  . Cyanocobalamin (B-12 COMPLIANCE INJECTION) 1000 MCG/ML KIT Inject as directed.    . cycloSPORINE (RESTASIS MULTIDOSE) 0.05 % ophthalmic emulsion Place 1 drop into both eyes 2 (two) times daily. 0.4 mL 2  . linaclotide (  LINZESS) 290 MCG CAPS capsule Take 1 capsule (290 mcg total) by mouth daily before breakfast. 90 capsule 0  . metoCLOPramide (REGLAN) 10 MG tablet 1 tab PO TID prn Nausea 30 tablet 3  . nebivolol (BYSTOLIC) 2.5 MG tablet Take 1 tablet (2.5 mg total) by mouth daily. 30 tablet 5  . pantoprazole (PROTONIX) 40 MG tablet Take 1 tablet (40 mg total) by mouth daily. 30 tablet 3  . SUMAtriptan (IMITREX) 100 MG tablet Take 1 tablet (100 mg total) by mouth once as needed for migraine. May repeat in 2 hours if headache persists or recurs. 10 tablet 0  . valACYclovir (VALTREX)  1000 MG tablet Take 1 tablet (1,000 mg total) by mouth 2 (two) times daily. (Patient taking differently: Take 1,000 mg by mouth as needed. ) 14 tablet 11  . dicyclomine (BENTYL) 10 MG capsule Take 1 capsule (10 mg total) by mouth 3 (three) times daily before meals. (Patient taking differently: Take 10 mg by mouth daily as needed. ) 90 capsule 1  . magic mouthwash w/lidocaine SOLN Take 5 mLs by mouth 3 (three) times daily as needed for mouth pain. Swish, gargle and spit. Diphenhydramine, Maalox, lidocaine 2% mix1-1-1 150 mL 0  . polyethylene glycol powder (GLYCOLAX/MIRALAX) powder Take 17 g by mouth 2 (two) times daily as needed. 3350 g 1   No facility-administered medications prior to visit.      Allergies:   Influenza vaccines   Social History   Social History  . Marital status: Divorced    Spouse name: N/A  . Number of children: 3  . Years of education: N/A   Social History Main Topics  . Smoking status: Current Every Day Smoker    Years: 5.00    Types: Cigarettes  . Smokeless tobacco: Never Used     Comment: 4-5 cigs/day  . Alcohol use Yes     Comment: occasionally  . Drug use: No  . Sexual activity: Not Asked     Comment: tubal ligation   Other Topics Concern  . None   Social History Narrative  . None     Family History:  The patient's family history includes CAD in her father; Diabetes in her mother.   Review of Systems:   Please see the history of present illness.     General:  No chills, fever, night sweats or weight changes.  Cardiovascular:  No chest pain, dyspnea on exertion, edema, orthopnea, paroxysmal nocturnal dyspnea. Positive for palpitations and dizziness.  Dermatological: No rash, lesions/masses Respiratory: No cough, dyspnea Urologic: No hematuria, dysuria Abdominal:   No nausea, vomiting, diarrhea, bright red blood per rectum, melena, or hematemesis Neurologic:  No visual changes, wkns, changes in mental status. All other systems reviewed and are  otherwise negative except as noted above.   Physical Exam:    VS:  BP 104/72   Pulse 83   Ht $R'5\' 2"'iT$  (1.575 m)   Wt 175 lb 12.8 oz (79.7 kg)   BMI 32.15 kg/m    General: Well developed, well nourished [place] American female appearing in no acute distress. Head: Normocephalic, atraumatic, sclera non-icteric, no xanthomas, nares are without discharge.  Neck: No carotid bruits. JVD not elevated.  Lungs: Respirations regular and unlabored, without wheezes or rales.  Heart: Regular rate and rhythm with frequent ectopic beats. No S3 or S4.  No murmur, no rubs, or gallops appreciated. Abdomen: Soft, non-tender, non-distended with normoactive bowel sounds. No hepatomegaly. No rebound/guarding. No obvious abdominal masses. Msk:  Strength  and tone appear normal for age. No joint deformities or effusions. Extremities: No clubbing or cyanosis. No lower extremity edema.  Distal pedal pulses are 2+ bilaterally. Neuro: Alert and oriented X 3. Moves all extremities spontaneously. No focal deficits noted. Psych:  Responds to questions appropriately with a normal affect. Skin: No rashes or lesions noted  Wt Readings from Last 3 Encounters:  05/27/17 175 lb 12.8 oz (79.7 kg)  05/08/17 176 lb (79.8 kg)  04/23/17 171 lb (77.6 kg)     Studies/Labs Reviewed:   EKG:  EKG is ordered today.  The ekg ordered today demonstrates NSR, HR 83, with frequent PVC's.   Recent Labs: 12/21/2016: ALT 9; BUN 12; Creatinine 0.9; Hemoglobin 12.4 03/21/2017: TSH 1.74   Lipid Panel    Component Value Date/Time   CHOL 163 12/21/2016   TRIG 187 (A) 12/21/2016   HDL 52 12/21/2016   [place] 74 12/21/2016    Additional studies/ records that were reviewed today include:   Echocardiogram: 03/2016:    Assessment:    1. Palpitations   2. PVCs (premature ventricular contractions)   3. PAF (paroxysmal atrial fibrillation) ([place])   4. Tobacco dependence      Plan:   In order of problems listed above:  1.  Palpitations/ PVC's  - event monitor in 01/2017 showed sinus rhythm with PVC's. Over the past 2 weeks she has experienced worsening palpitations with associated dyspnea and dizziness. She denies any associated chest discomfort, orthopnea, PND, or presyncope. -  At the time of her visit today she is experiencing active palpitations and her EKG shows normal sinus rhythm with frequent PVCs. - labs were recently checked and showed no acute abnormalities. No changes in her diet, medications or increased emotional stress. She does note that over the past week she has cut out caffeine.  - will increase Bystolic from 2.'5mg'$  daily to 2.'5mg'$  BID (will space out dosing to lessen side-effects of fatigue or possible hypotension). Once her palpitations improve, she can go back to 2.'5mg'$  daily as her symptoms are likely exacerbated by her recent changes of caffeine intake. The benign nature of PVC's was reviewed in detail.   2. PAF - initially occurred in 02/2016 with no known recurrence since. Her symptoms over the past two weeks seem most consistent with PAC's and PVC's.  - not on anticoagulation due to her CHA2DS2-VASc Score of 1 (female sex). Continue ASA '81mg'$  daily with Bystolic dosing as above.   3. Tobacco Use - cessation advised.    Medication Adjustments/Labs and Tests Ordered: Current medicines are reviewed at length with the patient today.  Concerns regarding medicines are outlined above.  Medication changes, Labs and Tests ordered today are listed in the Patient Instructions below. Patient Instructions  Medication Instructions:  INCREASE BYSTOLIC TO 2.'5MG'$  TWICE DAILY; ONCE THEY IMPROVE GO BACK TO 2.'5MG'$  DAILY **AVOID CAFFEINE AND ALCOHOL** If you need a refill on your cardiac medications before your next appointment, please call your pharmacy.  Follow-Up: Your physician wants you to follow-up in: [place].    Thank you for choosing [facility] at [facility], [place], [place]  05/27/2017 7:52 PM    [address], [place], [place]  [address] Phone: [phone]; Fax: [phone]  [address], [place], [place] [address] Phone: [phone]

## 2017-05-27 NOTE — Patient Instructions (Addendum)
Medication Instructions:  INCREASE BYSTOLIC TO 2.5MG  TWICE DAILY; ONCE THEY IMPROVE GO BACK TO 2.5MG  DAILY **AVOID CAFFEINE AND ALCOHOL** If you need a refill on your cardiac medications before your next appointment, please call your pharmacy.  Follow-Up: Your physician wants you to follow-up in: [place].    Thank you for choosing [facility] at [facility]!!

## 2017-05-27 NOTE — Telephone Encounter (Signed)
New message    Patient c/o Palpitations:  High priority if patient c/o lightheadedness and shortness of breath.  1. How long have you been having palpitations? More frequently the last 2 weeks.  2. Are you currently experiencing lightheadedness and shortness of breath? Yes when it happens-lightheadedness right now  3. Have you checked your BP and heart rate? (document readings) no  4. Are you experiencing any other symptoms? no

## 2017-06-04 ENCOUNTER — Ambulatory Visit: Payer: 59 | Admitting: Family Medicine

## 2017-06-09 ENCOUNTER — Ambulatory Visit (INDEPENDENT_AMBULATORY_CARE_PROVIDER_SITE_OTHER): Payer: 59 | Admitting: Physician Assistant

## 2017-06-09 VITALS — BP 136/60 | HR 100

## 2017-06-09 DIAGNOSIS — E538 Deficiency of other specified B group vitamins: Secondary | ICD-10-CM

## 2017-06-09 MED ORDER — CYANOCOBALAMIN 1000 MCG/ML IJ SOLN
1000.0000 ug | Freq: Once | INTRAMUSCULAR | Status: AC
  Administered 2017-06-09: 1000 ug via INTRAMUSCULAR

## 2017-06-09 NOTE — Progress Notes (Signed)
Patient was seen in clinic today for monthly B12 injection. Patient reports no medication side effects or complications. Patient tolerated injection in right deltoid well, no immediate complications. Will set up next appt for 4 weeks. Pt is due for lab recheck, she will get this completed prior to next injection.

## 2017-06-16 DIAGNOSIS — H04123 Dry eye syndrome of bilateral lacrimal glands: Secondary | ICD-10-CM | POA: Diagnosis not present

## 2017-06-26 ENCOUNTER — Encounter (INDEPENDENT_AMBULATORY_CARE_PROVIDER_SITE_OTHER): Payer: Self-pay | Admitting: Family Medicine

## 2017-06-27 ENCOUNTER — Other Ambulatory Visit: Payer: Self-pay | Admitting: Physician Assistant

## 2017-06-27 DIAGNOSIS — G4452 New daily persistent headache (NDPH): Secondary | ICD-10-CM

## 2017-06-27 MED ORDER — SUMATRIPTAN SUCCINATE 100 MG PO TABS: 100.0000 mg | ORAL_TABLET | Freq: Once | ORAL | 1 refills | Status: DC | PRN

## 2017-06-30 ENCOUNTER — Ambulatory Visit (INDEPENDENT_AMBULATORY_CARE_PROVIDER_SITE_OTHER): Payer: 59 | Admitting: Family Medicine

## 2017-06-30 ENCOUNTER — Encounter (INDEPENDENT_AMBULATORY_CARE_PROVIDER_SITE_OTHER): Payer: Self-pay | Admitting: Family Medicine

## 2017-06-30 VITALS — BP 112/69 | HR 92 | Temp 98.5°F | Ht 62.0 in | Wt 171.0 lb

## 2017-06-30 DIAGNOSIS — E538 Deficiency of other specified B group vitamins: Secondary | ICD-10-CM

## 2017-06-30 DIAGNOSIS — R5383 Other fatigue: Secondary | ICD-10-CM | POA: Diagnosis not present

## 2017-06-30 DIAGNOSIS — K5900 Constipation, unspecified: Secondary | ICD-10-CM | POA: Diagnosis not present

## 2017-06-30 DIAGNOSIS — E66811 Obesity, class 1: Secondary | ICD-10-CM

## 2017-06-30 DIAGNOSIS — R0600 Dyspnea, unspecified: Secondary | ICD-10-CM

## 2017-06-30 DIAGNOSIS — Z6831 Body mass index (BMI) 31.0-31.9, adult: Secondary | ICD-10-CM | POA: Diagnosis not present

## 2017-06-30 DIAGNOSIS — E669 Obesity, unspecified: Secondary | ICD-10-CM | POA: Diagnosis not present

## 2017-06-30 DIAGNOSIS — Z0289 Encounter for other administrative examinations: Secondary | ICD-10-CM

## 2017-06-30 DIAGNOSIS — Z1389 Encounter for screening for other disorder: Secondary | ICD-10-CM | POA: Diagnosis not present

## 2017-06-30 DIAGNOSIS — R0609 Other forms of dyspnea: Secondary | ICD-10-CM | POA: Diagnosis not present

## 2017-06-30 DIAGNOSIS — Z1331 Encounter for screening for depression: Secondary | ICD-10-CM

## 2017-06-30 DIAGNOSIS — E559 Vitamin D deficiency, unspecified: Secondary | ICD-10-CM | POA: Diagnosis not present

## 2017-06-30 NOTE — Progress Notes (Signed)
Office: [phone]  /  Fax: [phone]   Dear Luvenia Starch L. Alden Hipp, PA-C,   Thank you for referring Kelsey Vaughan to our clinic. The following note includes my evaluation and treatment recommendations.  HPI:   Chief Complaint: OBESITY  Kelsey Vaughan has been referred by Jade L. Alden Hipp, PA-C for consultation regarding her obesity and obesity related comorbidities.  Kelsey Vaughan (MR# [id number]) is a 41 y.o. female who presents on 06/30/2017 for obesity evaluation and treatment. Current BMI is Body mass index is 31.28 kg/m.[name] Kitchen Kelsey Vaughan has struggled with obesity for years and has been unsuccessful in either losing weight or maintaining long term weight loss. Kelsey Vaughan attended our information session and states she is currently in the action stage of change and ready to dedicate time achieving and maintaining a healthier weight.  Kelsey Vaughan states her family eats meals together she thinks her family will eat healthier with  her her desired weight loss is 25 lbs she started gaining weight the last year now her heaviest weight ever was 183 lbs. she is a picky eater and doesn't like to eat healthier foods  she has significant food cravings issues  she skips meals frequently she is frequently drinking liquids with calories she frequently makes poor food choices she frequently eats larger portions than normal  she has binge eating behaviors she struggles with emotional eating    Fatigue Memory feels her energy is lower than it should be. This has worsened with weight gain and has not worsened recently. Kelsey Vaughan admits to daytime somnolence and  admits to waking up still tired. Patient is at risk for obstructive sleep apnea. Patent has a history of symptoms of daytime fatigue and morning headache. Patient generally gets 6 hours of sleep per night, and states they generally have restless sleep. Snoring is present. Apneic episodes are not present. Epworth Sleepiness Score is 5  Dyspnea on  exertion Kelsey Vaughan notes increasing shortness of breath with exercising and seems to be worsening over time with weight gain. She notes getting out of breath sooner with activity than she used to. This has not gotten worse recently. Kelsey Vaughan denies orthopnea.  Vitamin D deficiency Kelsey Vaughan has a diagnosis of vitamin D deficiency. She is currently taking OTC vit D, no recent labs. She admits fatigue and denies nausea, vomiting or muscle weakness.  B12 Deficiency Kelsey Vaughan has a diagnosis of B12 insufficiency and notes fatigue. She is on OTC B12 and still notes fatigue. This is not a new diagnosis. Kelsey Vaughan is not a vegetarian and does not have a previous diagnosis of pernicious anemia. She does not have a history of weight loss surgery.   Constipation Kelsey Vaughan is having regular BM's now but has had problems in the past with constipation.   Depression Screen Kelsey Vaughan's Food and Mood (modified PHQ-9) score was  Depression screen PHQ 2/9 06/30/2017  Decreased Interest 1  Down, Depressed, Hopeless 1  PHQ - 2 Score 2  Altered sleeping 0  Tired, decreased energy 1  Change in appetite 3  Feeling bad or failure about yourself  0  Trouble concentrating 3  Moving slowly or fidgety/restless 0  Suicidal thoughts 0  PHQ-9 Score 9    ALLERGIES: Allergies  Allergen Reactions  . Influenza Vaccines Rash    MEDICATIONS: Current Outpatient Prescriptions on File Prior to Visit  Medication Sig Dispense Refill  . aspirin EC 81 MG tablet Take 1 tablet (81 mg total) by mouth daily. 90 tablet 3  . Cyanocobalamin (B-12 COMPLIANCE INJECTION) 1000 MCG/ML KIT Inject  as directed.    . cycloSPORINE (RESTASIS MULTIDOSE) 0.05 % ophthalmic emulsion Place 1 drop into both eyes 2 (two) times daily. 0.4 mL 2  . metoCLOPramide (REGLAN) 10 MG tablet 1 tab PO TID prn Nausea 30 tablet 3  . nebivolol (BYSTOLIC) 2.5 MG tablet Take 1 tablet (2.5 mg total) by mouth daily. 30 tablet 5  . pantoprazole (PROTONIX) 40 MG tablet Take 1  tablet (40 mg total) by mouth daily. 30 tablet 3  . SUMAtriptan (IMITREX) 100 MG tablet Take 1 tablet (100 mg total) by mouth once as needed for migraine. May repeat in 2 hours if headache persists or recurs. 10 tablet 1  . valACYclovir (VALTREX) 1000 MG tablet Take 1 tablet (1,000 mg total) by mouth 2 (two) times daily. (Patient taking differently: Take 1,000 mg by mouth as needed. ) 14 tablet 11   No current facility-administered medications on file prior to visit.     PAST MEDICAL HISTORY: Past Medical History:  Diagnosis Date  . Anxiety   . Atrial fibrillation ([place])    a. occuring in 02/2016  . B12 deficiency   . Constipation   . Foreign body in foot, left 07/2013  . GERD (gastroesophageal reflux disease)   . Hematuria   . Irregular menses    due to oral contraceptive  . Palpitations    a. 01/2017: Event monitor showing SR with PVC's.   . Sinus tachycardia   . Thyroid nodule   . Vitamin D deficiency     PAST SURGICAL HISTORY: Past Surgical History:  Procedure Laterality Date  . CESAREAN SECTION    . FOREIGN BODY REMOVAL Left 08/05/2013   Procedure: LEFT FOOT FOREIGN BODY REMOVAL ADULT;  Surgeon: Wylene Simmer, MD;  Location: [facility];  Service: Orthopedics;  Laterality: Left;  . SHOULDER ARTHROSCOPY Left   . TUBAL LIGATION      SOCIAL HISTORY: Social History  Substance Use Topics  . Smoking status: Current Every Day Smoker    Years: 6.00    Types: Cigarettes  . Smokeless tobacco: Never Used     Comment: 4-5 cigs/day  . Alcohol use Yes     Comment: occasionally    FAMILY HISTORY: Family History  Problem Relation Age of Onset  . Diabetes Mother   . Hypertension Mother   . Kidney disease Mother   . CAD Father   . Breast cancer Neg Hx     ROS: Review of Systems  Constitutional: Positive for malaise/fatigue.  Eyes:       Wear Glasses or Contacts  Respiratory: Positive for shortness of breath (on exertion).   Gastrointestinal: Positive for  constipation. Negative for nausea and vomiting.  Musculoskeletal:       Negative muscle weakness  Neurological: Positive for headaches.  Psychiatric/Behavioral: Positive for depression.       Stress    PHYSICAL EXAM: Blood pressure 112/69, pulse 92, temperature 98.5 F (36.9 C), temperature source Oral, height _0  (1.575 m), weight 171 lb (77.6 kg), SpO2 99 %. Body mass index is 31.28 kg/m. Physical Exam  Constitutional: She is oriented to person, place, and time. She appears well-developed and well-nourished.  Cardiovascular: Normal rate.   Pulmonary/Chest: Effort normal.  Musculoskeletal: Normal range of motion.  Neurological: She is oriented to person, place, and time.  Skin: Skin is warm and dry.  Psychiatric: She has a normal mood and affect. Her behavior is normal.  Vitals reviewed.   RECENT LABS AND TESTS: BMET  Component Value Date/Time   NA 140 03/07/2016 0927   K 3.8 03/07/2016 0927   CL 106 03/07/2016 0927   CO2 23 03/07/2016 0927   GLUCOSE 91 03/07/2016 0927   BUN 12 12/21/2016   CREATININE 0.9 12/21/2016   CREATININE 0.78 03/07/2016 0927   CALCIUM 8.9 03/07/2016 0927   GFRNONAA >89 03/07/2016 0927   GFRAA 99 12/21/2016   GFRAA >89 03/07/2016 0927   Lab Results  Component Value Date   HGBA1C 5.5 12/21/2016   No results found for: INSULIN CBC    Component Value Date/Time   WBC 6-10 12/24/2016   RBC 11-30 12/24/2016   HGB 12.4 12/21/2016   HCT 37.8 05/26/2013 1737   PLT 326 05/26/2013 1737   MCV 86 12/21/2016   MCH 29.2 05/26/2013 1737   MCHC 35.2 05/26/2013 1737   RDW 15.1 05/26/2013 1737   LYMPHSABS 4.9 (H) 05/26/2013 1737   MONOABS 1.1 (H) 05/26/2013 1737   EOSABS 0.2 05/26/2013 1737   BASOSABS 0.0 05/26/2013 1737   Iron/TIBC/Ferritin/ %Sat No results found for: IRON, TIBC, FERRITIN, IRONPCTSAT Lipid Panel     Component Value Date/Time   CHOL 163 12/21/2016   TRIG 187 (A) 12/21/2016   HDL 52 12/21/2016   LDLCALC 74 12/21/2016    Hepatic Function Panel     Component Value Date/Time   PROT 6.9 03/07/2016 0927   ALBUMIN 3.5 (L) 03/07/2016 0927   AST 8 (A) 12/21/2016   ALT 9 12/21/2016   ALKPHOS 65 12/21/2016   BILITOT 0.4 03/07/2016 0927      Component Value Date/Time   TSH 1.74 03/21/2017 2131   TSH 0.750 12/21/2016   TSH 1.19 03/07/2016 0927    ECG  shows NSR with a rate of 90 BPM INDIRECT CALORIMETER done today shows a VO2 of 178 and a REE of 1235.    ASSESSMENT AND PLAN: Other fatigue - Plan: EKG 12-Lead, Comprehensive metabolic panel, CBC With Differential, Hemoglobin A1c, Insulin, random, Lipid Panel With LDL/HDL Ratio, TSH, T4, free, T3  Dyspnea on exertion  Vitamin D deficiency - Plan: VITAMIN D 25 Hydroxy (Vit-D Deficiency, Fractures)  Vitamin B 12 deficiency - Plan: Vitamin B12, Folate  Constipation, unspecified constipation type  Screening for depression  Class 1 obesity without serious comorbidity with body mass index (BMI) of 31.0 to 31.9 in adult, unspecified obesity type  PLAN: Fatigue Ellayna was informed that her fatigue may be related to obesity, depression or many other causes. Labs will be ordered, and in the meanwhile Kelsey Vaughan has agreed to work on diet, exercise and weight loss to help with fatigue. Proper sleep hygiene was discussed including the need for 7-8 hours of quality sleep each night. A sleep study was not ordered based on symptoms and Epworth score.  Dyspnea on exertion Kelsey Vaughan's shortness of breath appears to be obesity related and exercise induced. She has agreed to work on weight loss and gradually increase exercise to treat her exercise induced shortness of breath. If Kelsey Vaughan follows our instructions and loses weight without improvement of her shortness of breath, we will plan to refer to pulmonology. We will monitor this condition regularly. Kelsey Vaughan agrees to this plan.  Vitamin D Deficiency Kelsey Vaughan was informed that low vitamin D levels contributes to fatigue and  are associated with obesity, breast, and colon cancer. She agrees to continue to take OTC Vit D and we will check labs and will follow up for routine testing of vitamin D, at least 2-3 times per year. She  was informed of the risk of over-replacement of vitamin D and agrees to not increase her dose unless he discusses this with [place] first. Kelsey Vaughan agrees to follow up with our clinic in 2 weeks.  B12 Deficiency Kelsey Vaughan will work on increasing B12 rich foods in her diet. B12 supplementation was not prescribed today. We will check labs and Kelsey Vaughan agrees to follow up with our clinic in 2 weeks.  Constipation Kelsey Vaughan was informed decrease bowel movement frequency is normal while losing weight, but stools should not be hard or painful. She agrees to increase her H20 intake and work on increasing her fiber intake and we will montior. High fiber foods were discussed today.  Depression Screen Kelsey Vaughan had a mildly positive depression screening. Depression is commonly associated with obesity and often results in emotional eating behaviors. We will monitor this closely and work on CBT to help improve the non-hunger eating patterns. Referral to Psychology may be required if no improvement is seen as she continues in our clinic.  Obesity Kelsey Vaughan is currently in the action stage of change and her goal is to continue with weight loss efforts. I recommend Cassandria begin the structured treatment plan as follows:  She has agreed to follow the Category 1 plan +100 calories Amariah has been instructed to eventually work up to a goal of 150 minutes of combined cardio and strengthening exercise per week for weight loss and overall health benefits. We discussed the following Behavioral Modification Strategies today: increase H2O intake, increase high fiber foods, increasing lean protein intake, increasing vegetables, dealing with family or coworker sabotage and travel eating strategies   Wendelin has agreed to join our obesity program  and follow up with our clinic in 2 to 3 weeks. She was informed of the importance of frequent follow up visits to maximize her success with intensive lifestyle modifications for her multiple health conditions. She was informed we would discuss her lab results at her next visit unless there is a critical issue that needs to be addressed sooner. Adelis agreed to keep her next visit at the agreed upon time to discuss these results.  I, Doreene Nest, am acting as transcriptionist for Dennard Nip, MD  I have reviewed the above documentation for accuracy and completeness, and I agree with the above. -Dennard Nip, MD  OBESITY BEHAVIORAL INTERVENTION VISIT  Today's visit was # 1 out of 55.  Starting weight: 171 lbs Starting date: 06/30/17 Today's weight : 171 lbs Today's date: 06/30/2017 Total lbs lost to date: 0 (Patients must lose 7 lbs in the first 6 months to continue with counseling)   ASK: We discussed the diagnosis of obesity with [name] today and Jaxyn agreed to give [place] permission to discuss obesity behavioral modification therapy today.  ASSESS: Leahanna has the diagnosis of obesity and her BMI today is 31.3 Chenel is in the action stage of change   ADVISE: Kirin was educated on the multiple health risks of obesity as well as the benefit of weight loss to improve her health. She was advised of the need for long term treatment and the importance of lifestyle modifications.  AGREE: Multiple dietary modification options and treatment options were discussed and  Brynna agreed to follow the Category 1 plan +100 calories We discussed the following Behavioral Modification Strategies today: increase H2O intake, increase high fiber foods, increasing lean protein intake, increasing vegetables, dealing with family or coworker sabotage and travel eating strategies

## 2017-07-02 LAB — COMPREHENSIVE METABOLIC PANEL
A/G RATIO: 1.3 (ref 1.2–2.2)
ALBUMIN: 4 g/dL (ref 3.5–5.5)
ALT: 17 IU/L (ref 0–32)
AST: 14 IU/L (ref 0–40)
Alkaline Phosphatase: 63 IU/L (ref 39–117)
BUN / CREAT RATIO: 13 (ref 9–23)
BUN: 10 mg/dL (ref 6–24)
Bilirubin Total: 0.3 mg/dL (ref 0.0–1.2)
CALCIUM: 9.1 mg/dL (ref 8.7–10.2)
CO2: 21 mmol/L (ref 20–29)
Chloride: 104 mmol/L (ref 96–106)
Creatinine, Ser: 0.79 mg/dL (ref 0.57–1.00)
GFR, EST AFRICAN AMERICAN: 108 mL/min/{1.73_m2} (ref >59)
GFR, EST NON AFRICAN AMERICAN: 94 mL/min/{1.73_m2} (ref >59)
Globulin, Total: 3.1 g/dL (ref 1.5–4.5)
Glucose: 84 mg/dL (ref 65–99)
Potassium: 4.1 mmol/L (ref 3.5–5.2)
Sodium: 141 mmol/L (ref 134–144)
TOTAL PROTEIN: 7.1 g/dL (ref 6.0–8.5)

## 2017-07-02 LAB — CBC WITH DIFFERENTIAL
BASOS: 0 %
Basophils Absolute: 0 10*3/uL (ref 0.0–0.2)
EOS (ABSOLUTE): 0.2 10*3/uL (ref 0.0–0.4)
EOS: 2 %
HEMATOCRIT: 39.8 % (ref 34.0–46.6)
HEMOGLOBIN: 13.2 g/dL (ref 11.1–15.9)
Immature Grans (Abs): 0 10*3/uL (ref 0.0–0.1)
Immature Granulocytes: 0 %
Lymphocytes Absolute: 2.7 10*3/uL (ref 0.7–3.1)
Lymphs: 31 %
MCH: 29.1 pg (ref 26.6–33.0)
MCHC: 33.2 g/dL (ref 31.5–35.7)
MCV: 88 fL (ref 79–97)
MONOS ABS: 0.7 10*3/uL (ref 0.1–0.9)
Monocytes: 8 %
NEUTROS ABS: 5.1 10*3/uL (ref 1.4–7.0)
Neutrophils: 59 %
RBC: 4.54 x10E6/uL (ref 3.77–5.28)
RDW: 15.5 % — ABNORMAL HIGH (ref 12.3–15.4)
WBC: 8.7 10*3/uL (ref 3.4–10.8)

## 2017-07-02 LAB — LIPID PANEL WITH LDL/HDL RATIO
Cholesterol, Total: 172 mg/dL (ref 100–199)
HDL: 58 mg/dL (ref >39)
LDL CALC: 85 mg/dL (ref 0–99)
LDl/HDL Ratio: 1.5 ratio (ref 0.0–3.2)
TRIGLYCERIDES: 143 mg/dL (ref 0–149)
VLDL CHOLESTEROL CAL: 29 mg/dL (ref 5–40)

## 2017-07-02 LAB — VITAMIN B12: Vitamin B-12: 532 pg/mL (ref 232–1245)

## 2017-07-02 LAB — T4, FREE: Free T4: 1.16 ng/dL (ref 0.82–1.77)

## 2017-07-02 LAB — HEMOGLOBIN A1C
ESTIMATED AVERAGE GLUCOSE: 111 mg/dL
Hgb A1c MFr Bld: 5.5 % (ref 4.8–5.6)

## 2017-07-02 LAB — INSULIN, RANDOM: INSULIN: 10.9 u[IU]/mL (ref 2.6–24.9)

## 2017-07-02 LAB — T3: T3 TOTAL: 153 ng/dL (ref 71–180)

## 2017-07-02 LAB — VITAMIN D 25 HYDROXY (VIT D DEFICIENCY, FRACTURES): Vit D, 25-Hydroxy: 26.8 ng/mL — ABNORMAL LOW (ref 30.0–100.0)

## 2017-07-02 LAB — FOLATE: Folate: 5.8 ng/mL (ref >3.0)

## 2017-07-02 LAB — TSH: TSH: 0.918 u[IU]/mL (ref 0.450–4.500)

## 2017-07-21 ENCOUNTER — Ambulatory Visit (INDEPENDENT_AMBULATORY_CARE_PROVIDER_SITE_OTHER): Payer: 59 | Admitting: Family Medicine

## 2017-07-21 VITALS — BP 99/66 | HR 90 | Temp 98.0°F | Ht 62.0 in | Wt 174.0 lb

## 2017-07-21 DIAGNOSIS — Z9189 Other specified personal risk factors, not elsewhere classified: Secondary | ICD-10-CM | POA: Diagnosis not present

## 2017-07-21 DIAGNOSIS — E559 Vitamin D deficiency, unspecified: Secondary | ICD-10-CM

## 2017-07-21 DIAGNOSIS — E669 Obesity, unspecified: Secondary | ICD-10-CM

## 2017-07-21 DIAGNOSIS — E8881 Metabolic syndrome: Secondary | ICD-10-CM | POA: Diagnosis not present

## 2017-07-21 DIAGNOSIS — Z6831 Body mass index (BMI) 31.0-31.9, adult: Secondary | ICD-10-CM | POA: Diagnosis not present

## 2017-07-21 MED ORDER — VITAMIN D (ERGOCALCIFEROL) 1.25 MG (50000 UNIT) PO CAPS: 50000.0000 [IU] | ORAL_CAPSULE | ORAL | 0 refills | Status: DC

## 2017-07-21 MED ORDER — METFORMIN HCL 500 MG PO TABS: 500.0000 mg | ORAL_TABLET | Freq: Every day | ORAL | 0 refills | Status: DC

## 2017-07-21 MED FILL — metFORMIN HCL 500 MG TABS: 500 | 30 days supply | Qty: 30 | Fill #0

## 2017-07-21 MED FILL — VIT D2 1.25 MG (50,000 UNIT: 1.25 MG | 28 days supply | Qty: 4 | Fill #0

## 2017-07-21 NOTE — Progress Notes (Signed)
Office: [phone]  /  Fax: [phone]   HPI:   Chief Complaint: OBESITY Kelsey Vaughan is here to discuss her progress with her obesity treatment plan. She is on the  follow the Category 1 plan and is following her eating plan approximately 0 % of the time. She states she is exercising 0 minutes 0 times per week. Kelsey Vaughan was on vacation and her mother was hospitalized and Kelsey Vaughan was unable to follow the diet plan. She is now ready to re-start. Her weight is 174 lb (78.9 kg) today and has had a weight gain of 3 pounds over a period of 3 weeks since her last visit. She has gained 3 lbs since starting treatment with [place].  Vitamin D deficiency Kelsey Vaughan has a diagnosis of vitamin D deficiency. She is not currently taking vit D and denies nausea, vomiting or muscle weakness.  Insulin Resistance Kelsey Vaughan has a new diagnosis of insulin resistance based on her elevated fasting insulin level >5. Although Kelsey Vaughan's blood glucose readings are still under good control, insulin resistance puts her at greater risk of metabolic syndrome and diabetes. She is not taking metformin currently and continues to work on diet and exercise to decrease risk of diabetes.  At risk for diabetes Kelsey Vaughan is at higher than average risk for developing diabetes due to her obesity and insulin resistance. She currently denies polyuria or polydipsia.  ALLERGIES: Allergies  Allergen Reactions  . Influenza Vaccines Rash    MEDICATIONS: Current Outpatient Prescriptions on File Prior to Visit  Medication Sig Dispense Refill  . aspirin EC 81 MG tablet Take 1 tablet (81 mg total) by mouth daily. 90 tablet 3  . Cyanocobalamin (B-12 COMPLIANCE INJECTION) 1000 MCG/ML KIT Inject as directed.    . cycloSPORINE (RESTASIS MULTIDOSE) 0.05 % ophthalmic emulsion Place 1 drop into both eyes 2 (two) times daily. 0.4 mL 2  . metoCLOPramide (REGLAN) 10 MG tablet 1 tab PO TID prn Nausea 30 tablet 3  . nebivolol (BYSTOLIC) 2.5 MG tablet Take 1 tablet  (2.5 mg total) by mouth daily. 30 tablet 5  . pantoprazole (PROTONIX) 40 MG tablet Take 1 tablet (40 mg total) by mouth daily. 30 tablet 3  . SUMAtriptan (IMITREX) 100 MG tablet Take 1 tablet (100 mg total) by mouth once as needed for migraine. May repeat in 2 hours if headache persists or recurs. 10 tablet 1  . valACYclovir (VALTREX) 1000 MG tablet Take 1 tablet (1,000 mg total) by mouth 2 (two) times daily. (Patient taking differently: Take 1,000 mg by mouth as needed. ) 14 tablet 11   No current facility-administered medications on file prior to visit.     PAST MEDICAL HISTORY: Past Medical History:  Diagnosis Date  . Anxiety   . Atrial fibrillation ([place])    a. occuring in 02/2016  . B12 deficiency   . Constipation   . Foreign body in foot, left 07/2013  . GERD (gastroesophageal reflux disease)   . Hematuria   . Irregular menses    due to oral contraceptive  . Palpitations    a. 01/2017: Event monitor showing SR with PVC's.   . Sinus tachycardia   . Thyroid nodule   . Vitamin D deficiency     PAST SURGICAL HISTORY: Past Surgical History:  Procedure Laterality Date  . CESAREAN SECTION    . FOREIGN BODY REMOVAL Left 08/05/2013   Procedure: LEFT FOOT FOREIGN BODY REMOVAL ADULT;  Surgeon: Wylene Simmer, MD;  Location: [facility];  Service: Orthopedics;  Laterality: Left;  . SHOULDER ARTHROSCOPY Left   . TUBAL LIGATION      SOCIAL HISTORY: Social History  Substance Use Topics  . Smoking status: Current Every Day Smoker    Years: 6.00    Types: Cigarettes  . Smokeless tobacco: Never Used     Comment: 4-5 cigs/day  . Alcohol use Yes     Comment: occasionally    FAMILY HISTORY: Family History  Problem Relation Age of Onset  . Diabetes Mother   . Hypertension Mother   . Kidney disease Mother   . CAD Father   . Breast cancer Neg Hx     ROS: Review of Systems  Constitutional: Negative for weight loss.  Gastrointestinal: Negative for nausea and  vomiting.  Genitourinary: Negative for frequency.  Musculoskeletal:       Negative muscle weakness  Endo/Heme/Allergies: Negative for polydipsia.    PHYSICAL EXAM: Blood pressure 99/66, pulse 90, temperature 98 F (36.7 C), temperature source Oral, height '5\' 2"'$  (1.575 m), weight 174 lb (78.9 kg), SpO2 100 %. Body mass index is 31.83 kg/m. Physical Exam  Constitutional: She is oriented to person, place, and time. She appears well-developed and well-nourished.  Cardiovascular: Normal rate.   Pulmonary/Chest: Effort normal.  Musculoskeletal: Normal range of motion.  Neurological: She is oriented to person, place, and time.  Skin: Skin is warm and dry.  Psychiatric: She has a normal mood and affect. Her behavior is normal.  Vitals reviewed.   RECENT LABS AND TESTS: BMET    Component Value Date/Time   NA 141 06/30/2017 0919   K 4.1 06/30/2017 0919   CL 104 06/30/2017 0919   CO2 21 06/30/2017 0919   GLUCOSE 84 06/30/2017 0919   GLUCOSE 91 03/07/2016 0927   BUN 10 06/30/2017 0919   CREATININE 0.79 06/30/2017 0919   CREATININE 0.78 03/07/2016 0927   CALCIUM 9.1 06/30/2017 0919   GFRNONAA 94 06/30/2017 0919   GFRNONAA >89 03/07/2016 0927   GFRAA 108 06/30/2017 0919   GFRAA 99 12/21/2016   GFRAA >89 03/07/2016 0927   Lab Results  Component Value Date   HGBA1C 5.5 06/30/2017   HGBA1C 5.5 12/21/2016   Lab Results  Component Value Date   INSULIN 10.9 06/30/2017   CBC    Component Value Date/Time   WBC 8.7 06/30/2017 0919   WBC 6-10 12/24/2016   RBC 4.54 06/30/2017 0919   RBC 11-30 12/24/2016   HGB 13.2 06/30/2017 0919   HCT 39.8 06/30/2017 0919   PLT 326 05/26/2013 1737   MCV 88 06/30/2017 0919   MCV 86 12/21/2016   MCH 29.1 06/30/2017 0919   MCH 29.2 05/26/2013 1737   MCHC 33.2 06/30/2017 0919   MCHC 35.2 05/26/2013 1737   RDW 15.5 (H) 06/30/2017 0919   LYMPHSABS 2.7 06/30/2017 0919   MONOABS 1.1 (H) 05/26/2013 1737   EOSABS 0.2 06/30/2017 0919   BASOSABS  0.0 06/30/2017 0919   Iron/TIBC/Ferritin/ %Sat No results found for: IRON, TIBC, FERRITIN, IRONPCTSAT Lipid Panel     Component Value Date/Time   CHOL 172 06/30/2017 0919   TRIG 143 06/30/2017 0919   HDL 58 06/30/2017 0919   LDLCALC 85 06/30/2017 0919   Hepatic Function Panel     Component Value Date/Time   PROT 7.1 06/30/2017 0919   ALBUMIN 4.0 06/30/2017 0919   AST 14 06/30/2017 0919   ALT 17 06/30/2017 0919   ALKPHOS 63 06/30/2017 0919   BILITOT 0.3 06/30/2017 0919  Component Value Date/Time   TSH 0.918 06/30/2017 0919   TSH 1.74 03/21/2017 2131   TSH 0.750 12/21/2016    ASSESSMENT AND PLAN: Vitamin D deficiency - Plan: Vitamin D, Ergocalciferol, (DRISDOL) 50000 units CAPS capsule  Insulin resistance - Plan: metFORMIN (GLUCOPHAGE) 500 MG tablet  At risk for diabetes mellitus  Class 1 obesity with serious comorbidity and body mass index (BMI) of 31.0 to 31.9 in adult, unspecified obesity type  PLAN:  Vitamin D Deficiency Kelsey Vaughan was informed that low vitamin D levels contributes to fatigue and are associated with obesity, breast, and colon cancer. She agrees to start to take prescription Vit D '@50'$ ,000 IU every week #4 with no refills and will follow up for routine testing of vitamin D, at least 2-3 times per year. She was informed of the risk of over-replacement of vitamin D and agrees to not increase her dose unless he discusses this with [place] first. Kelsey Vaughan agrees to follow up with our clinic in 2 weeks.  Insulin Resistance Kelsey Vaughan will continue to work on weight loss, exercise, and decreasing simple carbohydrates in her diet to help decrease the risk of diabetes. We dicussed metformin including benefits and risks. She was informed that eating too many simple carbohydrates or too many calories at one sitting increases the likelihood of GI side effects. Kelsey Vaughan requested metformin for now and prescription was written today for metformin 500 mg every morning #30 with no  refills. Kelsey Vaughan agreed to follow up with [place] as directed to monitor her progress.  Diabetes risk counselling Kelsey Vaughan was given extended (15 minutes) diabetes prevention counseling today. She is 41 y.o. female and has risk factors for diabetes including obesity. We discussed intensive lifestyle modifications today with an emphasis on weight loss as well as increasing exercise and decreasing simple carbohydrates in her diet.  Obesity Kelsey Vaughan is currently in the action stage of change. As such, her goal is to continue with weight loss efforts She has agreed to follow the Category 1 plan Kelsey Vaughan has been instructed to work up to a goal of 150 minutes of combined cardio and strengthening exercise per week for weight loss and overall health benefits. We discussed the following Behavioral Modification Strategies today: no skipping meals, increasing lean protein intake, decreasing simple carbohydrates, decrease junk food and decrease liquid calories  Aritzel has agreed to follow up with our clinic in 2 weeks. She was informed of the importance of frequent follow up visits to maximize her success with intensive lifestyle modifications for her multiple health conditions.  I, Kelsey Vaughan, am acting as transcriptionist for Dennard Nip, MD  I have reviewed the above documentation for accuracy and completeness, and I agree with the above. -Dennard Nip, MD   OBESITY BEHAVIORAL INTERVENTION VISIT  Today's visit was # 2 out of 37.  Starting weight: 171 lbs Starting date: 06/30/17 Today's weight : 174 lbs Today's date: 07/21/2017 Total lbs lost to date: 0 (Patients must lose 7 lbs in the first 6 months to continue with counseling)   ASK: We discussed the diagnosis of obesity with Kelsey Vaughan today and Kelsey Vaughan agreed to give [place] permission to discuss obesity behavioral modification therapy today.  ASSESS: Kelsey Vaughan has the diagnosis of obesity and her BMI today is 31.9 Kelsey Vaughan is in the action stage  of change   ADVISE: Kelsey Vaughan was educated on the multiple health risks of obesity as well as the benefit of weight loss to improve her health. She was advised of the need for long term  treatment and the importance of lifestyle modifications.  AGREE: Multiple dietary modification options and treatment options were discussed and  Kelsey Vaughan agreed to follow the Category 1 plan We discussed the following Behavioral Modification Strategies today: no skipping meals, increasing lean protein intake, decreasing simple carbohydrates , decrease junk food and decrease liquid calories

## 2017-07-25 ENCOUNTER — Other Ambulatory Visit: Payer: Self-pay | Admitting: Sports Medicine

## 2017-07-28 NOTE — Progress Notes (Signed)
HPI: FU palpitations and PAF. Echo 4/17 ([facility]) normal LV function. ABIs 3/18 normal. TSH 4/18 normal. Pt had episode of atrial fibrillation 3/17. Event monitor 2/18 showed sinus with PVCs. Bystolic increased 0/16 because of palpitations associated with PVCs. Since last seen, patient states her palpitations are much improved as her work was rotated with her job. She denies dyspnea, chest pain or syncope. She is not taking her bystolic.  Current Outpatient Prescriptions  Medication Sig Dispense Refill  . buPROPion (WELLBUTRIN XL) 150 MG 24 hr tablet Take 1 tablet (150 mg total) by mouth daily. 30 tablet 1  . cycloSPORINE (RESTASIS MULTIDOSE) 0.05 % ophthalmic emulsion Place 1 drop into both eyes 2 (two) times daily. (Patient taking differently: Place 1 drop into both eyes 3 times/day as needed-between meals & bedtime. ) 0.4 mL 2  . ENSKYCE 0.15-30 MG-MCG tablet TAKE ONE TABLET BY MOUTH ONCE DAILY 84 tablet 3  . metFORMIN (GLUCOPHAGE) 500 MG tablet Take 1 tablet (500 mg total) by mouth daily with breakfast. 30 tablet 0  . metoCLOPramide (REGLAN) 10 MG tablet 1 tab PO TID prn Nausea 30 tablet 3  . pantoprazole (PROTONIX) 40 MG tablet Take 1 tablet (40 mg total) by mouth daily. (Patient taking differently: Take 40 mg by mouth 3 times/day as needed-between meals & bedtime. ) 30 tablet 3  . SUMAtriptan (IMITREX) 100 MG tablet Take 1 tablet (100 mg total) by mouth once as needed for migraine. May repeat in 2 hours if headache persists or recurs. 10 tablet 1  . valACYclovir (VALTREX) 1000 MG tablet Take 1 tablet (1,000 mg total) by mouth 2 (two) times daily. (Patient taking differently: Take 1,000 mg by mouth as needed. ) 14 tablet 11  . Vitamin D, Ergocalciferol, (DRISDOL) 50000 units CAPS capsule Take 1 capsule (50,000 Units total) by mouth every 7 (seven) days. 4 capsule 0   No current facility-administered medications for this visit.      Past Medical History:  Diagnosis Date  . Anxiety     . Atrial fibrillation ([facility])    a. occuring in 02/2016  . B12 deficiency   . Constipation   . Foreign body in foot, left 07/2013  . GERD (gastroesophageal reflux disease)   . Hematuria   . Irregular menses    due to oral contraceptive  . Palpitations    a. 01/2017: Event monitor showing SR with PVC's.   . Sinus tachycardia   . Thyroid nodule   . Vitamin D deficiency     Past Surgical History:  Procedure Laterality Date  . CESAREAN SECTION    . FOREIGN BODY REMOVAL Left 08/05/2013   Procedure: LEFT FOOT FOREIGN BODY REMOVAL ADULT;  Surgeon: Wylene Simmer, MD;  Location: [facility];  Service: Orthopedics;  Laterality: Left;  . SHOULDER ARTHROSCOPY Left   . TUBAL LIGATION      Social History   Social History  . Marital status: Divorced    Spouse name: N/A  . Number of children: 3  . Years of education: N/A   Occupational History  . certified medical assistant [employer] History Main Topics  . Smoking status: Current Every Day Smoker    Years: 6.00    Types: Cigarettes  . Smokeless tobacco: Never Used     Comment: 4-5 cigs/day  . Alcohol use Yes     Comment: occasionally  . Drug use: No  . Sexual activity: Not on file  Comment: tubal ligation   Other Topics Concern  . Not on file   Social History Narrative  . No narrative on file    Family History  Problem Relation Age of Onset  . Diabetes Mother   . Hypertension Mother   . Kidney disease Mother   . CAD Father   . Breast cancer Neg Hx     ROS: no fevers or chills, productive cough, hemoptysis, dysphasia, odynophagia, melena, hematochezia, dysuria, hematuria, rash, seizure activity, orthopnea, PND, pedal edema, claudication. Remaining systems are negative.  Physical Exam: Well-developed well-nourished in no acute distress.  Skin is warm and dry.  HEENT is normal.  Neck is supple.  Chest is clear to auscultation with normal expansion.  Cardiovascular exam is regular rate and  rhythm.  Abdominal exam nontender or distended. No masses palpated. Extremities show no edema. neuro grossly intact   A/P  1 Paroxysmal atrial fibrillation-patient is in sinus rhythm on examination today. We will resume bystolic 2.5 mg daily for rate control if atrial fibrillation recurs. We can consider addition of antiarrhythmic in the future if she has more frequent episodes. CHADSvasc 1 (female sex). Continue aspirin at this point.  2 palpitations-improved; resume low dose bystolic as outlined above.   3 tobacco abuse-patient counseled on discontinuing.  Kirk Ruths, MD

## 2017-07-29 ENCOUNTER — Encounter: Payer: Self-pay | Admitting: Physician Assistant

## 2017-07-29 ENCOUNTER — Ambulatory Visit (INDEPENDENT_AMBULATORY_CARE_PROVIDER_SITE_OTHER): Payer: 59 | Admitting: Physician Assistant

## 2017-07-29 VITALS — BP 117/77 | HR 97 | Wt 178.0 lb

## 2017-07-29 DIAGNOSIS — F321 Major depressive disorder, single episode, moderate: Secondary | ICD-10-CM

## 2017-07-29 DIAGNOSIS — E8881 Metabolic syndrome: Secondary | ICD-10-CM

## 2017-07-29 DIAGNOSIS — F411 Generalized anxiety disorder: Secondary | ICD-10-CM | POA: Diagnosis not present

## 2017-07-29 MED ORDER — BUPROPION HCL ER (XL) 150 MG PO TB24: 150.0000 mg | ORAL_TABLET | Freq: Every day | ORAL | 1 refills | Status: DC

## 2017-07-29 MED FILL — BUPROPION HCL XL 150 MG TAB: 150 | 30 days supply | Qty: 30 | Fill #0
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
## 2017-07-29 NOTE — Patient Instructions (Signed)
[facility]  Bupropion extended-release tablets (Depression/Mood Disorders) What is this medicine? BUPROPION (byoo PROE pee on) is used to treat depression. This medicine may be used for other purposes; ask your health care provider or pharmacist if you have questions. COMMON BRAND NAME(S): Aplenzin, Budeprion XL, Forfivo XL, Wellbutrin XL What should I tell my health care provider before I take this medicine? They need to know if you have any of these conditions: -an eating disorder, such as anorexia or bulimia -bipolar disorder or psychosis -diabetes or high blood sugar, treated with medication -glaucoma -head injury or brain tumor -heart disease, previous heart attack, or irregular heart beat -high blood pressure -kidney or liver disease -seizures (convulsions) -suicidal thoughts or a previous suicide attempt -Tourette's syndrome -weight loss -an unusual or allergic reaction to bupropion, other medicines, foods, dyes, or preservatives -breast-feeding -pregnant or trying to become pregnant How should I use this medicine? Take this medicine by mouth with a glass of water. Follow the directions on the prescription label. You can take it with or without food. If it upsets your stomach, take it with food. Do not crush, chew, or cut these tablets. This medicine is taken once daily at the same time each day. Do not take your medicine more often than directed. Do not stop taking this medicine suddenly except upon the advice of your doctor. Stopping this medicine too quickly may cause serious side effects or your condition may worsen. A special MedGuide will be given to you by the pharmacist with each prescription and refill. Be sure to read this information carefully each time. Talk to your pediatrician regarding the use of this medicine in children. Special care may be needed. Overdosage: If you think you have taken too much of this medicine contact a poison control  center or emergency room at once. NOTE: This medicine is only for you. Do not share this medicine with others. What if I miss a dose? If you miss a dose, skip the missed dose and take your next tablet at the regular time. Do not take double or extra doses. What may interact with this medicine? Do not take this medicine with any of the following medications: -linezolid -MAOIs like Azilect, Carbex, Eldepryl, Marplan, Nardil, and Parnate -methylene blue (injected into a vein) -other medicines that contain bupropion like Zyban This medicine may also interact with the following medications: -alcohol -certain medicines for anxiety or sleep -certain medicines for blood pressure like metoprolol, propranolol -certain medicines for depression or psychotic disturbances -certain medicines for HIV or AIDS like efavirenz, lopinavir, nelfinavir, ritonavir -certain medicines for irregular heart beat like propafenone, flecainide -certain medicines for Parkinson's disease like amantadine, levodopa -certain medicines for seizures like carbamazepine, phenytoin, phenobarbital -cimetidine -clopidogrel -cyclophosphamide -digoxin -furazolidone -isoniazid -nicotine -orphenadrine -procarbazine -steroid medicines like prednisone or cortisone -stimulant medicines for attention disorders, weight loss, or to stay awake -tamoxifen -theophylline -thiotepa -ticlopidine -tramadol -warfarin This list may not describe all possible interactions. Give your health care provider a list of all the medicines, herbs, non-prescription drugs, or dietary supplements you use. Also tell them if you smoke, drink alcohol, or use illegal drugs. Some items may interact with your medicine. What should I watch for while using this medicine? Tell your doctor if your symptoms do not get better or if they get worse. Visit your doctor or health care professional for regular checks on your progress. Because it may take several weeks to  see the full effects of this medicine, it is important  to continue your treatment as prescribed by your doctor. Patients and their families should watch out for new or worsening thoughts of suicide or depression. Also watch out for sudden changes in feelings such as feeling anxious, agitated, panicky, irritable, hostile, aggressive, impulsive, severely restless, overly excited and hyperactive, or not being able to sleep. If this happens, especially at the beginning of treatment or after a change in dose, call your health care professional. Avoid alcoholic drinks while taking this medicine. Drinking large amounts of alcoholic beverages, using sleeping or anxiety medicines, or quickly stopping the use of these agents while taking this medicine may increase your risk for a seizure. Do not drive or use heavy machinery until you know how this medicine affects you. This medicine can impair your ability to perform these tasks. Do not take this medicine close to bedtime. It may prevent you from sleeping. Your mouth may get dry. Chewing sugarless gum or sucking hard candy, and drinking plenty of water may help. Contact your doctor if the problem does not go away or is severe. The tablet shell for some brands of this medicine does not dissolve. This is normal. The tablet shell may appear whole in the stool. This is not a cause for concern. What side effects may I notice from receiving this medicine? Side effects that you should report to your doctor or health care professional as soon as possible: -allergic reactions like skin rash, itching or hives, swelling of the face, lips, or tongue -breathing problems -changes in vision -confusion -elevated mood, decreased need for sleep, racing thoughts, impulsive behavior -fast or irregular heartbeat -hallucinations, loss of contact with reality -increased blood pressure -redness, blistering, peeling or loosening of the skin, including inside the  mouth -seizures -suicidal thoughts or other mood changes -unusually weak or tired -vomiting Side effects that usually do not require medical attention (report to your doctor or health care professional if they continue or are bothersome): -constipation -headache -loss of appetite -nausea -tremors -weight loss This list may not describe all possible side effects. Call your doctor for medical advice about side effects. You may report side effects to FDA at 1-800-FDA-1088. Where should I keep my medicine? Keep out of the reach of children. Store at room temperature between 15 and 30 degrees C (59 and 86 degrees F). Throw away any unused medicine after the expiration date. NOTE: This sheet is a summary. It may not cover all possible information. If you have questions about this medicine, talk to your doctor, pharmacist, or health care provider.  2018 Elsevier/Gold Standard (2016-05-24 13:55:13)

## 2017-07-30 ENCOUNTER — Encounter: Payer: Self-pay | Admitting: Cardiology

## 2017-07-30 ENCOUNTER — Ambulatory Visit (INDEPENDENT_AMBULATORY_CARE_PROVIDER_SITE_OTHER): Payer: 59 | Admitting: Cardiology

## 2017-07-30 ENCOUNTER — Encounter: Payer: Self-pay | Admitting: Physician Assistant

## 2017-07-30 VITALS — BP 107/73 | HR 96 | Ht 62.0 in | Wt 180.4 lb

## 2017-07-30 DIAGNOSIS — E88819 Insulin resistance, unspecified: Secondary | ICD-10-CM | POA: Insufficient documentation

## 2017-07-30 DIAGNOSIS — R002 Palpitations: Secondary | ICD-10-CM | POA: Diagnosis not present

## 2017-07-30 DIAGNOSIS — F17200 Nicotine dependence, unspecified, uncomplicated: Secondary | ICD-10-CM | POA: Diagnosis not present

## 2017-07-30 DIAGNOSIS — I480 Paroxysmal atrial fibrillation: Secondary | ICD-10-CM

## 2017-07-30 DIAGNOSIS — E8881 Metabolic syndrome: Secondary | ICD-10-CM | POA: Insufficient documentation

## 2017-07-30 MED ORDER — NEBIVOLOL HCL 2.5 MG PO TABS: 2.5000 mg | ORAL_TABLET | Freq: Every day | ORAL | Status: DC

## 2017-07-30 NOTE — Patient Instructions (Signed)
Medication Instructions:   RESTART BYSTOLIC 2.5 MG ONCE DAILY  Follow-Up:  Your physician wants you to follow-up in: [place] will receive a reminder letter in the mail two months in advance. If you don't receive a letter, please call our office to schedule the follow-up appointment.   If you need a refill on your cardiac medications before your next appointment, please call your pharmacy.

## 2017-08-04 ENCOUNTER — Ambulatory Visit: Payer: 59 | Admitting: Physician Assistant

## 2017-08-06 ENCOUNTER — Ambulatory Visit: Payer: 59 | Admitting: Physician Assistant

## 2017-08-06 ENCOUNTER — Ambulatory Visit (INDEPENDENT_AMBULATORY_CARE_PROVIDER_SITE_OTHER): Payer: 59 | Admitting: Physician Assistant

## 2017-08-06 VITALS — BP 115/70 | HR 88 | Temp 97.6°F | Wt 177.0 lb

## 2017-08-06 DIAGNOSIS — F321 Major depressive disorder, single episode, moderate: Secondary | ICD-10-CM | POA: Diagnosis not present

## 2017-08-06 DIAGNOSIS — Z720 Tobacco use: Secondary | ICD-10-CM | POA: Diagnosis not present

## 2017-08-06 DIAGNOSIS — R319 Hematuria, unspecified: Secondary | ICD-10-CM

## 2017-08-06 DIAGNOSIS — F489 Nonpsychotic mental disorder, unspecified: Secondary | ICD-10-CM

## 2017-08-06 DIAGNOSIS — R4589 Other symptoms and signs involving emotional state: Secondary | ICD-10-CM

## 2017-08-06 DIAGNOSIS — Z6831 Body mass index (BMI) 31.0-31.9, adult: Secondary | ICD-10-CM | POA: Diagnosis not present

## 2017-08-06 DIAGNOSIS — R3129 Other microscopic hematuria: Secondary | ICD-10-CM | POA: Diagnosis not present

## 2017-08-06 LAB — POCT URINALYSIS DIPSTICK
Bilirubin, UA: NEGATIVE
Glucose, UA: NEGATIVE
Nitrite, UA: NEGATIVE
PH UA: 8 (ref 5.0–8.0)
PROTEIN UA: 100
Spec Grav, UA: 1.02 (ref 1.010–1.025)
Urobilinogen, UA: 2 E.U./dL — AB

## 2017-08-06 NOTE — Addendum Note (Signed)
Addended by: Donella Stade on: 08/06/2017 04:45 PM   Modules accepted: Orders

## 2017-08-06 NOTE — Progress Notes (Addendum)
Pt reports noticing dark colored urine this morning, based on POC urine dipstick a urine culture was ordered. PCP reviewed results, advised Pt to begin Cipro 500 mg, BID, for 3 days. Pt has Rx at home, no new one required.   .. Results for orders placed or performed in visit on 08/06/17  POCT Urinalysis Dipstick  Result Value Ref Range   Color, UA yellow    Clarity, UA clear    Glucose, UA neg    Bilirubin, UA neg    Ketones, UA trace    Spec Grav, UA 1.020 1.010 - 1.025   Blood, UA mod    pH, UA 8.0 5.0 - 8.0   Protein, UA 100    Urobilinogen, UA 2.0 (A) 0.2 or 1.0 E.U./dL   Nitrite, UA neg    Leukocytes, UA Trace (A) Negative   Will go ahead and treat. I still want culture.  Repeat in one week to make sure blood has cleared.  Pt see neprhology for proteinuria.   Pt request to check her hormones. I will place order. Still having periods but feels very moody.  Iran Planas PA-C

## 2017-08-06 NOTE — Progress Notes (Signed)
Please culture.

## 2017-08-07 ENCOUNTER — Ambulatory Visit (INDEPENDENT_AMBULATORY_CARE_PROVIDER_SITE_OTHER): Payer: 59 | Admitting: Family Medicine

## 2017-08-07 VITALS — BP 96/64 | HR 86 | Temp 98.4°F | Ht 62.0 in | Wt 173.0 lb

## 2017-08-07 DIAGNOSIS — E669 Obesity, unspecified: Secondary | ICD-10-CM

## 2017-08-07 DIAGNOSIS — E559 Vitamin D deficiency, unspecified: Secondary | ICD-10-CM | POA: Diagnosis not present

## 2017-08-07 DIAGNOSIS — Z9189 Other specified personal risk factors, not elsewhere classified: Secondary | ICD-10-CM | POA: Diagnosis not present

## 2017-08-07 DIAGNOSIS — Z6831 Body mass index (BMI) 31.0-31.9, adult: Secondary | ICD-10-CM

## 2017-08-07 LAB — URINE CULTURE

## 2017-08-07 LAB — FOLLICLE STIMULATING HORMONE: FSH: 5.7 m[IU]/mL

## 2017-08-07 LAB — PROGESTERONE: Progesterone: 1.4 ng/mL

## 2017-08-07 LAB — ESTRADIOL: Estradiol: 553 pg/mL — ABNORMAL HIGH

## 2017-08-07 LAB — LUTEINIZING HORMONE: LH: 7.2 m[IU]/mL

## 2017-08-07 MED ORDER — VITAMIN D (ERGOCALCIFEROL) 1.25 MG (50000 UNIT) PO CAPS: 50000.0000 [IU] | ORAL_CAPSULE | ORAL | 0 refills | Status: DC

## 2017-08-07 NOTE — Progress Notes (Signed)
Office: [phone]  /  Fax: [phone]   HPI:   Chief Complaint: OBESITY Kelsey Vaughan is here to discuss her progress with her obesity treatment plan. She is on the Category 1 plan and is following her eating plan approximately 5 % of the time. She states she is exercising 0 minutes 0 times per week. Kelsey Vaughan is deviating from her plan still and is mostly trying to control her portions and make smarter food choices. She would like to look at other options. Her weight is 173 lb (78.5 kg) today and has had a weight loss of 1 pound over a period of 2 weeks since her last visit. She has gained 2 lbs since starting treatment with [place].  Vitamin D deficiency Kelsey Vaughan has a diagnosis of vitamin D deficiency. She is currently on vit D but not yet at goal and denies nausea, vomiting or muscle weakness.  Kelsey Vaughan is at higher risk of osteopenia and osteoporosis due to vitamin D deficiency.   ALLERGIES: Allergies  Allergen Reactions   Influenza Vaccines Rash    MEDICATIONS: Current Outpatient Prescriptions on File Prior to Visit  Medication Sig Dispense Refill   buPROPion (WELLBUTRIN XL) 150 MG 24 hr tablet Take 1 tablet (150 mg total) by mouth daily. 30 tablet 1   cycloSPORINE (RESTASIS MULTIDOSE) 0.05 % ophthalmic emulsion Place 1 drop into both eyes 2 (two) times daily. (Patient taking differently: Place 1 drop into both eyes 3 times/day as needed-between meals & bedtime. ) 0.4 mL 2   ENSKYCE 0.15-30 MG-MCG tablet TAKE ONE TABLET BY MOUTH ONCE DAILY 84 tablet 3   metFORMIN (GLUCOPHAGE) 500 MG tablet Take 1 tablet (500 mg total) by mouth daily with breakfast. 30 tablet 0   metoCLOPramide (REGLAN) 10 MG tablet 1 tab PO TID prn Nausea 30 tablet 3   nebivolol (BYSTOLIC) 2.5 MG tablet Take 1 tablet (2.5 mg total) by mouth daily.     SUMAtriptan (IMITREX) 100 MG tablet Take 1 tablet (100 mg total) by mouth once as needed for migraine. May repeat in 2 hours if headache persists or recurs. 10 tablet  1   valACYclovir (VALTREX) 1000 MG tablet Take 1 tablet (1,000 mg total) by mouth 2 (two) times daily. (Patient taking differently: Take 1,000 mg by mouth as needed. ) 14 tablet 11   No current facility-administered medications on file prior to visit.     PAST MEDICAL HISTORY: Past Medical History:  Diagnosis Date   Anxiety    Atrial fibrillation ([place])    a. occuring in 02/2016   B12 deficiency    Constipation    Foreign body in foot, left 07/2013   GERD (gastroesophageal reflux disease)    Hematuria    Irregular menses    due to oral contraceptive   Palpitations    a. 01/2017: Event monitor showing SR with PVC's.    Sinus tachycardia    Thyroid nodule    Vitamin D deficiency     PAST SURGICAL HISTORY: Past Surgical History:  Procedure Laterality Date   CESAREAN SECTION     FOREIGN BODY REMOVAL Left 08/05/2013   Procedure: LEFT FOOT FOREIGN BODY REMOVAL ADULT;  Surgeon: Wylene Simmer, MD;  Location: [facility];  Service: Orthopedics;  Laterality: Left;   SHOULDER ARTHROSCOPY Left    TUBAL LIGATION      SOCIAL HISTORY: Social History  Substance Use Topics   Smoking status: Current Every Day Smoker    Years: 6.00    Types: Cigarettes  Smokeless tobacco: Never Used     Comment: 4-5 cigs/day   Alcohol use Yes     Comment: occasionally    FAMILY HISTORY: Family History  Problem Relation Age of Onset   Diabetes Mother    Hypertension Mother    Kidney disease Mother    CAD Father    Breast cancer Neg Hx     ROS: Review of Systems  Constitutional: Positive for weight loss.  Gastrointestinal: Negative for nausea and vomiting.  Genitourinary: Negative for frequency.  Musculoskeletal:       Negative muscle weakness  Endo/Heme/Allergies: Negative for polydipsia.    PHYSICAL EXAM: Blood pressure 96/64, pulse 86, temperature 98.4 F (36.9 C), temperature source Oral, height 5\' 2"  (1.575 m), weight 173 lb (78.5 kg), last  menstrual period 08/07/2016, SpO2 100 %. Body mass index is 31.64 kg/m. Physical Exam  Constitutional: She is oriented to person, place, and time. She appears well-developed and well-nourished.  Cardiovascular: Normal rate.   Pulmonary/Chest: Effort normal.  Musculoskeletal: Normal range of motion.  Neurological: She is oriented to person, place, and time.  Skin: Skin is warm and dry.  Psychiatric: She has a normal mood and affect. Her behavior is normal.  Vitals reviewed.   RECENT LABS AND TESTS: BMET    Component Value Date/Time   NA 141 06/30/2017 0919   K 4.1 06/30/2017 0919   CL 104 06/30/2017 0919   CO2 21 06/30/2017 0919   GLUCOSE 84 06/30/2017 0919   GLUCOSE 91 03/07/2016 0927   BUN 10 06/30/2017 0919   CREATININE 0.79 06/30/2017 0919   CREATININE 0.78 03/07/2016 0927   CALCIUM 9.1 06/30/2017 0919   GFRNONAA 94 06/30/2017 0919   GFRNONAA >89 03/07/2016 0927   GFRAA 108 06/30/2017 0919   GFRAA 99 12/21/2016   GFRAA >89 03/07/2016 0927   Lab Results  Component Value Date   HGBA1C 5.5 06/30/2017   HGBA1C 5.5 12/21/2016   Lab Results  Component Value Date   INSULIN 10.9 06/30/2017   CBC    Component Value Date/Time   WBC 8.7 06/30/2017 0919   WBC 6-10 12/24/2016   RBC 4.54 06/30/2017 0919   RBC 11-30 12/24/2016   HGB 13.2 06/30/2017 0919   HCT 39.8 06/30/2017 0919   PLT 326 05/26/2013 1737   MCV 88 06/30/2017 0919   MCV 86 12/21/2016   MCH 29.1 06/30/2017 0919   MCH 29.2 05/26/2013 1737   MCHC 33.2 06/30/2017 0919   MCHC 35.2 05/26/2013 1737   RDW 15.5 (H) 06/30/2017 0919   LYMPHSABS 2.7 06/30/2017 0919   MONOABS 1.1 (H) 05/26/2013 1737   EOSABS 0.2 06/30/2017 0919   BASOSABS 0.0 06/30/2017 0919   Iron/TIBC/Ferritin/ %Sat No results found for: IRON, TIBC, FERRITIN, IRONPCTSAT Lipid Panel     Component Value Date/Time   CHOL 172 06/30/2017 0919   TRIG 143 06/30/2017 0919   HDL 58 06/30/2017 0919   LDLCALC 85 06/30/2017 0919   Hepatic  Function Panel     Component Value Date/Time   PROT 7.1 06/30/2017 0919   ALBUMIN 4.0 06/30/2017 0919   AST 14 06/30/2017 0919   ALT 17 06/30/2017 0919   ALKPHOS 63 06/30/2017 0919   BILITOT 0.3 06/30/2017 0919      Component Value Date/Time   TSH 0.918 06/30/2017 0919   TSH 1.74 03/21/2017 2131   TSH 0.750 12/21/2016    ASSESSMENT AND PLAN: Vitamin D deficiency - Plan: Vitamin D, Ergocalciferol, (DRISDOL) 50000 units CAPS capsule  At  risk for diabetes mellitus  Class 1 obesity without serious comorbidity with body mass index (BMI) of 31.0 to 31.9 in adult, unspecified obesity type  PLAN:  Vitamin D Deficiency Kelsey Vaughan was informed that low vitamin D levels contributes to fatigue and are associated with obesity, breast, and colon cancer. She agrees to continue to take prescription Vit D @50 ,000 IU every week, we will refill for 1 month and will follow up for routine testing of vitamin D, at least 2-3 times per year. She was informed of the risk of over-replacement of vitamin D and agrees to not increase her dose unless he discusses this with [place] first. Kelsey Vaughan agrees to follow up with our clinic in 2 to 3 weeks.  At risk for osteopenia Kelsey Vaughan is at risk for osteopenia and osteoporsis due to her vitamin D deficiency. She was encouraged to take her vitamin D and follow her higher calcium diet and increase strengthening exercise to help strengthen her bones and decrease her risk of osteopenia and osteoporosis.  Obesity Kelsey Vaughan is currently in the action stage of change. As such, her goal is to continue with weight loss efforts She has agreed to keep a food journal with 1000 to 1200 calories and 70+ grams of protein daily Kelsey Vaughan has been instructed to work up to a goal of 150 minutes of combined cardio and strengthening exercise per week for weight loss and overall health benefits. We discussed the following Behavioral Modification Strategies today: keep a strict food journal, increasing  lean protein intake, decreasing simple carbohydrates  and decrease eating out  Kelsey Vaughan has agreed to follow up with our clinic in 2 to 3 weeks. She was informed of the importance of frequent follow up visits to maximize her success with intensive lifestyle modifications for her multiple health conditions.  I, Kelsey Vaughan, am acting as transcriptionist for Dennard Nip, MD  I have reviewed the above documentation for accuracy and completeness, and I agree with the above. -Dennard Nip, MD  OBESITY BEHAVIORAL INTERVENTION VISIT  Today's visit was # 3 out of 22.  Starting weight: 171 lbs Starting date: 06/30/17 Today's weight : 173 lbs Today's date: 08/07/2017 Total lbs lost to date: 0 (Patients must lose 7 lbs in the first 6 months to continue with counseling)   ASK: We discussed the diagnosis of obesity with [name] today and Shaylea agreed to give [place] permission to discuss obesity behavioral modification therapy today.  ASSESS: Hina has the diagnosis of obesity and her BMI today is 31.63 Eyva is in the action stage of change   ADVISE: Lolamae was educated on the multiple health risks of obesity as well as the benefit of weight loss to improve her health. She was advised of the need for long term treatment and the importance of lifestyle modifications.  AGREE: Multiple dietary modification options and treatment options were discussed and  Beatrix agreed to keep a food journal with 1000 to 1200 calories and 70+ grams of protein daily We discussed the following Behavioral Modification Strategies today: keep a strict food journal, increasing lean protein intake, decreasing simple carbohydrates  and decrease eating out

## 2017-08-08 ENCOUNTER — Encounter: Payer: Self-pay | Admitting: Physician Assistant

## 2017-08-08 ENCOUNTER — Ambulatory Visit (INDEPENDENT_AMBULATORY_CARE_PROVIDER_SITE_OTHER): Payer: 59 | Admitting: Physician Assistant

## 2017-08-08 VITALS — BP 119/75 | HR 85 | Resp 16 | Wt 178.0 lb

## 2017-08-08 DIAGNOSIS — E538 Deficiency of other specified B group vitamins: Secondary | ICD-10-CM

## 2017-08-08 DIAGNOSIS — Z3009 Encounter for other general counseling and advice on contraception: Secondary | ICD-10-CM | POA: Diagnosis not present

## 2017-08-08 DIAGNOSIS — F321 Major depressive disorder, single episode, moderate: Secondary | ICD-10-CM | POA: Diagnosis not present

## 2017-08-08 DIAGNOSIS — K5900 Constipation, unspecified: Secondary | ICD-10-CM | POA: Diagnosis not present

## 2017-08-08 DIAGNOSIS — N644 Mastodynia: Secondary | ICD-10-CM

## 2017-08-08 MED ORDER — POLYETHYLENE GLYCOL 3350 17 GM/SCOOP PO POWD: 17.0000 g | Freq: Two times a day (BID) | ORAL | 11 refills | Status: DC | PRN

## 2017-08-08 MED ORDER — DESOGESTREL-ETHINYL ESTRADIOL 0.15-30 MG-MCG PO TABS: 1.0000 | ORAL_TABLET | Freq: Every day | ORAL | 4 refills | Status: DC

## 2017-08-08 MED FILL — JULEBER 0.15-30 MG-MCG TABS: 0.15-30 | 84 days supply | Qty: 84 | Fill #0

## 2017-08-08 MED FILL — POLYETHYLENE GLYCOL 3350 PO: 78 days supply | Qty: 2635 | Fill #0
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
## 2017-08-10 ENCOUNTER — Encounter: Payer: Self-pay | Admitting: Physician Assistant

## 2017-08-10 LAB — ESTROGENS, TOTAL: Estrogen: 565.7 pg/mL

## 2017-08-20 ENCOUNTER — Encounter: Payer: Self-pay | Admitting: Physician Assistant

## 2017-08-20 ENCOUNTER — Ambulatory Visit (INDEPENDENT_AMBULATORY_CARE_PROVIDER_SITE_OTHER): Payer: 59 | Admitting: Dietician

## 2017-08-20 ENCOUNTER — Ambulatory Visit (INDEPENDENT_AMBULATORY_CARE_PROVIDER_SITE_OTHER): Payer: 59 | Admitting: Physician Assistant

## 2017-08-20 VITALS — Ht 62.0 in | Wt 175.0 lb

## 2017-08-20 VITALS — BP 120/75 | HR 122 | Temp 98.2°F

## 2017-08-20 DIAGNOSIS — J0140 Acute pansinusitis, unspecified: Secondary | ICD-10-CM

## 2017-08-20 DIAGNOSIS — Z9189 Other specified personal risk factors, not elsewhere classified: Secondary | ICD-10-CM

## 2017-08-20 DIAGNOSIS — Z6832 Body mass index (BMI) 32.0-32.9, adult: Secondary | ICD-10-CM

## 2017-08-20 DIAGNOSIS — E8881 Metabolic syndrome: Secondary | ICD-10-CM

## 2017-08-20 DIAGNOSIS — B379 Candidiasis, unspecified: Secondary | ICD-10-CM | POA: Diagnosis not present

## 2017-08-20 DIAGNOSIS — E669 Obesity, unspecified: Secondary | ICD-10-CM

## 2017-08-20 DIAGNOSIS — J029 Acute pharyngitis, unspecified: Secondary | ICD-10-CM | POA: Diagnosis not present

## 2017-08-20 MED ORDER — AZITHROMYCIN 250 MG PO TABS: ORAL_TABLET | ORAL | 0 refills | Status: DC

## 2017-08-20 MED ORDER — FLUCONAZOLE 150 MG PO TABS: 150.0000 mg | ORAL_TABLET | Freq: Once | ORAL | 0 refills | Status: AC
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
## 2017-08-20 NOTE — Progress Notes (Addendum)
  Office: [phone]  /  Fax: [phone]   Diabetes Risk Counseling Roselani is at higher than average risk for developing diabetes due to her obesity and insulin resistance.  She currently denies polyuria or polydipsia.  Jamika was given extended (30 minutes) diabetes prevention counseling today. She is 41 y.o. female and has risk factors for diabetes including obesity. We discussed intensive lifestyle modifications today to assist her weight loss efforts.  Robinn has a diagnosis of insulin resistance based on her elevated fasting insulin level >5. Although Cherie's blood glucose readings are still under good control, insulin resistance puts her at greater risk of metabolic syndrome and diabetes.   Wilmer' weight today is 175 lbs. She has gained 2 lbs since starting treatment with [place]  on 06/30/17. She is currently on a journaling plan - 1000-1200 calories and 70+ grams of protein per day. Although Delanie continues to come to her appointments it appears she is not currently in the action stage of change. She is not journaling all of her intake. She states she is having a hard time meeting her protein goal. She continues to eat out regularly, choosing simple carbohydrates and making attempts at portion control her less than healthy choices.    Paraskevi  was educated about food nutrients ie protein, fats, simple and complex carbohydrates and how these affect insulin response and weight gain.  Her meal plan was individualized for maximum benefit.  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   Kale has been  instructed to work up to a goal of 150 minutes of combined cardio and strengthening exercise per week for weight loss and overall health benefits. Written information was provided and the following handouts were given:  healthy lean protein options.  Shamara agrees to the above and will follow up with our clinic in 2 weeks.

## 2017-08-26 ENCOUNTER — Ambulatory Visit (INDEPENDENT_AMBULATORY_CARE_PROVIDER_SITE_OTHER): Payer: 59 | Admitting: Family Medicine

## 2017-08-26 ENCOUNTER — Encounter (INDEPENDENT_AMBULATORY_CARE_PROVIDER_SITE_OTHER): Payer: Self-pay

## 2017-08-28 ENCOUNTER — Other Ambulatory Visit: Payer: Self-pay

## 2017-08-28 DIAGNOSIS — E559 Vitamin D deficiency, unspecified: Secondary | ICD-10-CM

## 2017-08-28 MED ORDER — VITAMIN D (ERGOCALCIFEROL) 1.25 MG (50000 UNIT) PO CAPS: 50000.0000 [IU] | ORAL_CAPSULE | ORAL | 0 refills | Status: DC

## 2017-08-28 MED FILL — VIT D2 1.25 MG (50,000 UNIT: 1.25 MG | 56 days supply | Qty: 8 | Fill #0

## 2017-09-02 ENCOUNTER — Other Ambulatory Visit: Payer: Self-pay | Admitting: Physician Assistant

## 2017-09-02 ENCOUNTER — Telehealth (INDEPENDENT_AMBULATORY_CARE_PROVIDER_SITE_OTHER): Payer: Self-pay | Admitting: Family Medicine

## 2017-09-02 DIAGNOSIS — J0140 Acute pansinusitis, unspecified: Secondary | ICD-10-CM

## 2017-09-02 DIAGNOSIS — E8881 Metabolic syndrome: Secondary | ICD-10-CM

## 2017-09-02 MED ORDER — METFORMIN HCL 500 MG PO TABS: 500.0000 mg | ORAL_TABLET | Freq: Every day | ORAL | 3 refills | Status: DC

## 2017-09-02 MED FILL — metFORMIN HCL 500 MG TABS: 500 | 90 days supply | Qty: 90 | Fill #0

## 2017-09-03 ENCOUNTER — Ambulatory Visit (INDEPENDENT_AMBULATORY_CARE_PROVIDER_SITE_OTHER): Payer: 59 | Admitting: Physician Assistant

## 2017-09-03 ENCOUNTER — Encounter: Payer: Self-pay | Admitting: Physician Assistant

## 2017-09-03 VITALS — BP 126/83 | HR 85 | Wt 178.0 lb

## 2017-09-03 DIAGNOSIS — F330 Major depressive disorder, recurrent, mild: Secondary | ICD-10-CM | POA: Diagnosis not present

## 2017-09-03 DIAGNOSIS — F411 Generalized anxiety disorder: Secondary | ICD-10-CM

## 2017-09-03 DIAGNOSIS — I479 Paroxysmal tachycardia, unspecified: Secondary | ICD-10-CM

## 2017-09-03 MED ORDER — HYDROXYZINE HCL 10 MG PO TABS: 10.0000 mg | ORAL_TABLET | Freq: Three times a day (TID) | ORAL | 0 refills | Status: DC | PRN

## 2017-09-03 MED ORDER — ESCITALOPRAM OXALATE 5 MG PO TABS: 5.0000 mg | ORAL_TABLET | Freq: Every day | ORAL | 0 refills | Status: DC

## 2017-09-03 MED FILL — ESCITALOPRAM 5 MG TABLET: 5 | 30 days supply | Qty: 30 | Fill #0

## 2017-09-03 MED FILL — hydrOXYzine HCL 10 MG TABS: 10 | 10 days supply | Qty: 30 | Fill #0
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
## 2017-09-10 ENCOUNTER — Encounter: Payer: Self-pay | Admitting: Physician Assistant

## 2017-09-12 NOTE — Telephone Encounter (Signed)
Agree with plan Brian Crenshaw  

## 2017-09-12 NOTE — Telephone Encounter (Signed)
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  BP 105/96  She admits to drinking mt dew this morning and always being stressed.  She took her bystolic this am 5mg .   I discussed limiting caffiene and looking for other triggers of PVC's. No concerning features today.   I will send to cardiologist to see if he has any suggestions other than above.   -Iran Planas PAC

## 2017-09-15 ENCOUNTER — Ambulatory Visit (INDEPENDENT_AMBULATORY_CARE_PROVIDER_SITE_OTHER): Payer: 59 | Admitting: Physician Assistant

## 2017-09-15 ENCOUNTER — Encounter: Payer: Self-pay | Admitting: Physician Assistant

## 2017-09-15 VITALS — BP 115/62 | HR 87 | Ht 62.0 in | Wt 179.0 lb

## 2017-09-15 DIAGNOSIS — R51 Headache: Secondary | ICD-10-CM | POA: Diagnosis not present

## 2017-09-15 DIAGNOSIS — R82998 Other abnormal findings in urine: Secondary | ICD-10-CM | POA: Diagnosis not present

## 2017-09-15 DIAGNOSIS — I491 Atrial premature depolarization: Secondary | ICD-10-CM

## 2017-09-15 DIAGNOSIS — R300 Dysuria: Secondary | ICD-10-CM

## 2017-09-15 DIAGNOSIS — I479 Paroxysmal tachycardia, unspecified: Secondary | ICD-10-CM | POA: Diagnosis not present

## 2017-09-15 DIAGNOSIS — R319 Hematuria, unspecified: Secondary | ICD-10-CM

## 2017-09-15 DIAGNOSIS — R519 Headache, unspecified: Secondary | ICD-10-CM

## 2017-09-15 LAB — POCT URINALYSIS DIPSTICK
BILIRUBIN UA: NEGATIVE
GLUCOSE UA: NEGATIVE
Nitrite, UA: NEGATIVE
Protein, UA: >=300
Urobilinogen, UA: 2 E.U./dL — AB
pH, UA: 6.5 (ref 5.0–8.0)

## 2017-09-15 MED ORDER — NEBIVOLOL HCL 2.5 MG PO TABS: 2.5000 mg | ORAL_TABLET | Freq: Every day | ORAL | 0 refills | Status: DC

## 2017-09-15 MED ORDER — CEPHALEXIN 500 MG PO CAPS: 500.0000 mg | ORAL_CAPSULE | Freq: Two times a day (BID) | ORAL | 0 refills | Status: DC

## 2017-09-15 MED FILL — CEPHALEXIN 500 MG CAPSULE: 500 | 7 days supply | Qty: 14 | Fill #0

## 2017-09-15 NOTE — Telephone Encounter (Signed)
Aware of dr Jacalyn Lefevre recommendations.  Patient also ask for samples of bystolic, aware left at the front desk for pick up

## 2017-09-15 NOTE — Patient Instructions (Addendum)
kelfex 500mg  twice a day for 7 days.   Chantix
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
## 2017-09-16 ENCOUNTER — Telehealth: Payer: Self-pay | Admitting: *Deleted

## 2017-09-16 ENCOUNTER — Ambulatory Visit (INDEPENDENT_AMBULATORY_CARE_PROVIDER_SITE_OTHER): Payer: 59 | Admitting: Obstetrics & Gynecology

## 2017-09-16 ENCOUNTER — Encounter: Payer: Self-pay | Admitting: Obstetrics & Gynecology

## 2017-09-16 VITALS — BP 118/69 | HR 85 | Temp 97.4°F | Resp 16 | Ht 62.0 in | Wt 180.0 lb

## 2017-09-16 DIAGNOSIS — R51 Headache: Secondary | ICD-10-CM

## 2017-09-16 DIAGNOSIS — Z01419 Encounter for gynecological examination (general) (routine) without abnormal findings: Secondary | ICD-10-CM

## 2017-09-16 DIAGNOSIS — R519 Headache, unspecified: Secondary | ICD-10-CM | POA: Insufficient documentation

## 2017-09-16 MED ORDER — FLUCONAZOLE 150 MG PO TABS: 150.0000 mg | ORAL_TABLET | Freq: Every day | ORAL | 1 refills | Status: DC

## 2017-09-16 MED FILL — valACYclovir HCL 1 GM TABS: 1 | 7 days supply | Qty: 14 | Fill #0

## 2017-09-16 MED FILL — FLUCONAZOLE 150 MG TABLET: 150 | 1 days supply | Qty: 1 | Fill #0

## 2017-09-16 NOTE — Telephone Encounter (Signed)
Per Luvenia Starch ok to send Diflucan to pharmacy for patient.

## 2017-09-16 NOTE — Progress Notes (Signed)
Subjective:    Kelsey Vaughan is a 41 y.o. D AA P3 (43, 90, and 52 yo kids, no grands) female who presents for an annual exam. The patient has no complaints today. The patient is not currently sexually active. GYN screening history: last pap: was normal. The patient wears seatbelts: yes. The patient participates in regular exercise: yes. Has the patient ever been transfused or tattooed?: yes. The patient reports that there is not domestic violence in her life.   Menstrual History: OB History    Gravida Para Term Preterm AB Living   3 3 3     3    SAB TAB Ectopic Multiple Live Births                  Menarche age: 28 No LMP recorded. Patient is not currently having periods (Reason: Irregular Periods).    The following portions of the patient's history were reviewed and updated as appropriate: allergies, current medications, past family history, past medical history, past social history, past surgical history and problem list.  Review of Systems Pertinent items are noted in HPI.   Works at [employer] for [employer] Allergic to flu vaccine FH- no breast/gyn/colon cancer Abstinent for 3- 69months S/p BTL Uses continuous OCPs for menstrual regulation due to fibroids Mammogram 1/18 and normal   Objective:    BP 118/69   Pulse 85   Temp (!) 97.4 F (36.3 C) (Oral)   Resp 16   Ht 5\' 2"  (1.575 m)   Wt 180 lb (81.6 kg)   BMI 32.92 kg/m   General Appearance:    Alert, cooperative, no distress, appears stated age  Head:    Normocephalic, without obvious abnormality, atraumatic  Eyes:    PERRL, conjunctiva/corneas clear, EOM's intact, fundi    benign, both eyes  Ears:    Normal TM's and external ear canals, both ears  Nose:   Nares normal, septum midline, mucosa normal, no drainage    or sinus tenderness  Throat:   Lips, mucosa, and tongue normal; teeth and gums normal  Neck:   Supple, symmetrical, trachea midline, no adenopathy;    thyroid:  no enlargement/tenderness/nodules; no  carotid   bruit or JVD  Back:     Symmetric, no curvature, ROM normal, no CVA tenderness  Lungs:     Clear to auscultation bilaterally, respirations unlabored  Chest Wall:    No tenderness or deformity   Heart:    Regular rate and rhythm, S1 and S2 normal, no murmur, rub   or gallop  Breast Exam:    No tenderness, masses, or nipple abnormality  Abdomen:     Soft, non-tender, bowel sounds active all four quadrants,    no masses, no organomegaly  Genitalia:    Normal female without lesion, discharge or tenderness, ULN size minimally mobile NT uterus, no adnexal masses or tenderness     Extremities:   Extremities normal, atraumatic, no cyanosis or edema  Pulses:   2+ and symmetric all extremities  Skin:   Skin color, texture, turgor normal, no rashes or lesions  Lymph nodes:   Cervical, supraclavicular, and axillary nodes normal  Neurologic:   CNII-XII intact, normal strength, sensation and reflexes    throughout  .    Assessment:    Healthy female exam.    Plan:     Thin prep Pap smear. with cotesting Rec annual pap

## 2017-09-17 LAB — URINE CULTURE
MICRO NUMBER: 81085824
SPECIMEN QUALITY:: ADEQUATE

## 2017-09-18 ENCOUNTER — Encounter: Payer: Self-pay | Admitting: Physician Assistant

## 2017-09-19 LAB — CYTOLOGY - PAP
Diagnosis: NEGATIVE
HPV (WINDOPATH): NOT DETECTED

## 2017-10-08 ENCOUNTER — Ambulatory Visit (INDEPENDENT_AMBULATORY_CARE_PROVIDER_SITE_OTHER): Payer: 59 | Admitting: Physician Assistant

## 2017-10-08 VITALS — BP 116/66 | HR 97

## 2017-10-08 DIAGNOSIS — R300 Dysuria: Secondary | ICD-10-CM

## 2017-10-08 DIAGNOSIS — R319 Hematuria, unspecified: Secondary | ICD-10-CM | POA: Diagnosis not present

## 2017-10-08 DIAGNOSIS — R350 Frequency of micturition: Secondary | ICD-10-CM

## 2017-10-08 LAB — POCT URINALYSIS DIPSTICK
BILIRUBIN UA: NEGATIVE
GLUCOSE UA: NEGATIVE
KETONES UA: NEGATIVE
Nitrite, UA: NEGATIVE
PROTEIN UA: 100
SPEC GRAV UA: 1.015 (ref 1.010–1.025)
Urobilinogen, UA: 0.2 E.U./dL
pH, UA: 7.5 (ref 5.0–8.0)

## 2017-10-08 NOTE — Progress Notes (Signed)
..   Results for orders placed or performed in visit on 10/08/17  POCT urinalysis dipstick  Result Value Ref Range   Color, UA yellow    Clarity, UA clear    Glucose, UA neg    Bilirubin, UA neg    Ketones, UA neg    Spec Grav, UA 1.015 1.010 - 1.025   Blood, UA moderate    pH, UA 7.5 5.0 - 8.0   Protein, UA 100    Urobilinogen, UA 0.2 0.2 or 1.0 E.U./dL   Nitrite, UA neg    Leukocytes, UA Trace (A) Negative   Pt is having urinary frequency. She has hx of negative cultures. Will culture today. Discussed this could be IC presenting. Discussed different foods that can irritate bladder. She will follow up with urology. Iran Planas PA-C

## 2017-10-10 ENCOUNTER — Telehealth: Payer: Self-pay | Admitting: Physician Assistant

## 2017-10-10 ENCOUNTER — Ambulatory Visit (INDEPENDENT_AMBULATORY_CARE_PROVIDER_SITE_OTHER): Payer: 59 | Admitting: Physician Assistant

## 2017-10-10 ENCOUNTER — Encounter: Payer: Self-pay | Admitting: Physician Assistant

## 2017-10-10 VITALS — BP 122/65 | HR 102 | Wt 182.0 lb

## 2017-10-10 DIAGNOSIS — R0683 Snoring: Secondary | ICD-10-CM | POA: Diagnosis not present

## 2017-10-10 DIAGNOSIS — I479 Paroxysmal tachycardia, unspecified: Secondary | ICD-10-CM

## 2017-10-10 DIAGNOSIS — E559 Vitamin D deficiency, unspecified: Secondary | ICD-10-CM

## 2017-10-10 DIAGNOSIS — F430 Acute stress reaction: Secondary | ICD-10-CM | POA: Diagnosis not present

## 2017-10-10 DIAGNOSIS — F339 Major depressive disorder, recurrent, unspecified: Secondary | ICD-10-CM | POA: Diagnosis not present

## 2017-10-10 DIAGNOSIS — Z6833 Body mass index (BMI) 33.0-33.9, adult: Secondary | ICD-10-CM | POA: Diagnosis not present

## 2017-10-10 DIAGNOSIS — F411 Generalized anxiety disorder: Secondary | ICD-10-CM | POA: Diagnosis not present

## 2017-10-10 DIAGNOSIS — E6609 Other obesity due to excess calories: Secondary | ICD-10-CM

## 2017-10-10 DIAGNOSIS — G478 Other sleep disorders: Secondary | ICD-10-CM | POA: Diagnosis not present

## 2017-10-10 LAB — URINE CULTURE
MICRO NUMBER:: 81191303
SPECIMEN QUALITY:: ADEQUATE

## 2017-10-10 MED ORDER — LIRAGLUTIDE -WEIGHT MANAGEMENT 18 MG/3ML ~~LOC~~ SOPN: 0.6000 mg | PEN_INJECTOR | Freq: Every day | SUBCUTANEOUS | 1 refills | Status: DC

## 2017-10-10 MED ORDER — VORTIOXETINE HBR 5 MG PO TABS: 1.0000 | ORAL_TABLET | Freq: Every day | ORAL | 1 refills | Status: DC
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
## 2017-10-10 NOTE — Patient Instructions (Signed)
Will get sleep apnea testing.

## 2017-10-10 NOTE — Telephone Encounter (Signed)
Completed PA for Saxenda via CoverMyMeds.

## 2017-10-12 ENCOUNTER — Encounter: Payer: Self-pay | Admitting: Physician Assistant

## 2017-10-12 DIAGNOSIS — F430 Acute stress reaction: Secondary | ICD-10-CM | POA: Insufficient documentation

## 2017-10-13 ENCOUNTER — Ambulatory Visit: Payer: 59 | Admitting: Physician Assistant

## 2017-10-14 ENCOUNTER — Ambulatory Visit (INDEPENDENT_AMBULATORY_CARE_PROVIDER_SITE_OTHER): Payer: 59 | Admitting: Physician Assistant

## 2017-10-14 VITALS — BP 109/73 | HR 93 | Ht 62.0 in | Wt 183.0 lb

## 2017-10-14 DIAGNOSIS — F339 Major depressive disorder, recurrent, unspecified: Secondary | ICD-10-CM | POA: Diagnosis not present

## 2017-10-14 DIAGNOSIS — F430 Acute stress reaction: Secondary | ICD-10-CM | POA: Diagnosis not present

## 2017-10-14 DIAGNOSIS — F419 Anxiety disorder, unspecified: Secondary | ICD-10-CM | POA: Diagnosis not present

## 2017-10-14 MED ORDER — ALPRAZOLAM 0.5 MG PO TABS: 0.5000 mg | ORAL_TABLET | Freq: Two times a day (BID) | ORAL | 0 refills | Status: DC | PRN
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
## 2017-10-15 ENCOUNTER — Ambulatory Visit (INDEPENDENT_AMBULATORY_CARE_PROVIDER_SITE_OTHER): Payer: Self-pay | Admitting: Psychiatry

## 2017-10-15 ENCOUNTER — Encounter (HOSPITAL_COMMUNITY): Payer: Self-pay | Admitting: Psychiatry

## 2017-10-15 DIAGNOSIS — R454 Irritability and anger: Secondary | ICD-10-CM

## 2017-10-15 DIAGNOSIS — R5383 Other fatigue: Secondary | ICD-10-CM

## 2017-10-15 DIAGNOSIS — Z564 Discord with boss and workmates: Secondary | ICD-10-CM

## 2017-10-15 DIAGNOSIS — R4586 Emotional lability: Secondary | ICD-10-CM

## 2017-10-15 DIAGNOSIS — F39 Unspecified mood [affective] disorder: Secondary | ICD-10-CM

## 2017-10-15 DIAGNOSIS — R4583 Excessive crying of child, adolescent or adult: Secondary | ICD-10-CM

## 2017-10-15 DIAGNOSIS — R41843 Psychomotor deficit: Secondary | ICD-10-CM

## 2017-10-15 DIAGNOSIS — Z91411 Personal history of adult psychological abuse: Secondary | ICD-10-CM

## 2017-10-15 DIAGNOSIS — F419 Anxiety disorder, unspecified: Secondary | ICD-10-CM

## 2017-10-15 DIAGNOSIS — Z566 Other physical and mental strain related to work: Secondary | ICD-10-CM

## 2017-10-15 DIAGNOSIS — F331 Major depressive disorder, recurrent, moderate: Secondary | ICD-10-CM

## 2017-10-15 DIAGNOSIS — F17210 Nicotine dependence, cigarettes, uncomplicated: Secondary | ICD-10-CM

## 2017-10-15 DIAGNOSIS — R635 Abnormal weight gain: Secondary | ICD-10-CM

## 2017-10-15 DIAGNOSIS — Z91410 Personal history of adult physical and sexual abuse: Secondary | ICD-10-CM

## 2017-10-15 DIAGNOSIS — R4582 Worries: Secondary | ICD-10-CM

## 2017-10-15 DIAGNOSIS — R4587 Impulsiveness: Secondary | ICD-10-CM

## 2017-10-15 MED ORDER — LAMOTRIGINE 25 MG PO TABS: ORAL_TABLET | ORAL | 0 refills | Status: DC

## 2017-10-15 MED FILL — ALPRAZolam 0.5 MG TABS: 0.5 | 15 days supply | Qty: 30 | Fill #0

## 2017-10-15 MED FILL — lamoTRIgine 25 MG TABS: 25 | 33 days supply | Qty: 60 | Fill #0
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
## 2017-10-17 ENCOUNTER — Ambulatory Visit (INDEPENDENT_AMBULATORY_CARE_PROVIDER_SITE_OTHER): Payer: 59 | Admitting: Physician Assistant

## 2017-10-17 ENCOUNTER — Encounter: Payer: Self-pay | Admitting: Physician Assistant

## 2017-10-17 VITALS — BP 115/70 | HR 108 | Ht 62.0 in | Wt 181.0 lb

## 2017-10-17 DIAGNOSIS — F430 Acute stress reaction: Secondary | ICD-10-CM

## 2017-10-17 DIAGNOSIS — F419 Anxiety disorder, unspecified: Secondary | ICD-10-CM | POA: Diagnosis not present

## 2017-10-17 DIAGNOSIS — F330 Major depressive disorder, recurrent, mild: Secondary | ICD-10-CM

## 2017-10-17 DIAGNOSIS — I479 Paroxysmal tachycardia, unspecified: Secondary | ICD-10-CM
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
## 2017-10-20 MED FILL — SAXENDA 18 MG/3 ML PEN: 18 | 30 days supply | Qty: 15 | Fill #0

## 2017-10-23 ENCOUNTER — Telehealth: Payer: Self-pay | Admitting: *Deleted

## 2017-10-23 MED FILL — TECHLITE PEN NDL 32GX1/4: 32G X 6 MM | 30 days supply | Qty: 100 | Fill #0

## 2017-10-23 MED FILL — TECHLITE PEN NDL 32GX1/4": 32G X 6 MM | 30 days supply | Qty: 100 | Fill #0

## 2017-10-23 MED FILL — BYSTOLIC 2.5 MG TABLET: 2.5 | 30 days supply | Qty: 30 | Fill #3

## 2017-10-23 NOTE — Telephone Encounter (Signed)
Saxenda approved.  Pt and pharmacy notified.

## 2017-10-27 ENCOUNTER — Encounter: Payer: Self-pay | Admitting: Physician Assistant

## 2017-10-29 ENCOUNTER — Ambulatory Visit (INDEPENDENT_AMBULATORY_CARE_PROVIDER_SITE_OTHER): Payer: 59 | Admitting: Family Medicine

## 2017-10-29 ENCOUNTER — Encounter: Payer: Self-pay | Admitting: Family Medicine

## 2017-10-29 VITALS — BP 102/69 | HR 99 | Temp 97.5°F | Ht 61.0 in | Wt 177.0 lb

## 2017-10-29 DIAGNOSIS — M79672 Pain in left foot: Secondary | ICD-10-CM

## 2017-10-29 DIAGNOSIS — M79671 Pain in right foot: Secondary | ICD-10-CM

## 2017-10-29 NOTE — Progress Notes (Signed)
    Orthotics Note:   Patient was fitted for a : standard, cushioned, semi-rigid orthotic. The orthotic was heated and afterward the patient stood on the orthotic blank positioned on the orthotic stand. The patient was positioned in subtalar neutral position and 10 degrees of ankle dorsiflexion in a weight bearing stance. After completion of molding, a stable base was applied to the orthotic blank. The blank was ground to a stable position for weight bearing. Size: 7 Base: [place] Health and safety inspector and Padding: None The patient ambulated these, and they were very comfortable.  I spent 40 minutes with this patient independent of orthotic preparation, greater than 50% was face-to-face time counseling regarding risks and benefits of orthotics expected side effects in addition to what type of shoes or compatible with semirigid orthotics as well as expected lifetime of orthotics.

## 2017-10-29 NOTE — Patient Instructions (Signed)
Thank you for coming in today. Use the orthotics as needed.  Consider scaphoid pads in shoes the orthotics do not fit into.  Recheck as needed.  Let me know if they feel strange or do not fit correctly.

## 2017-10-31 ENCOUNTER — Ambulatory Visit (HOSPITAL_BASED_OUTPATIENT_CLINIC_OR_DEPARTMENT_OTHER): Payer: 59 | Attending: Physician Assistant | Admitting: Internal Medicine

## 2017-10-31 VITALS — Ht 61.0 in | Wt 177.0 lb

## 2017-10-31 DIAGNOSIS — R0683 Snoring: Secondary | ICD-10-CM | POA: Insufficient documentation

## 2017-10-31 DIAGNOSIS — G478 Other sleep disorders: Secondary | ICD-10-CM | POA: Diagnosis not present

## 2017-11-04 ENCOUNTER — Encounter: Payer: Self-pay | Admitting: Physician Assistant

## 2017-11-04 ENCOUNTER — Ambulatory Visit (INDEPENDENT_AMBULATORY_CARE_PROVIDER_SITE_OTHER): Payer: 59 | Admitting: Physician Assistant

## 2017-11-04 VITALS — BP 98/67 | HR 92 | Temp 98.1°F | Ht 61.0 in | Wt 175.0 lb

## 2017-11-04 DIAGNOSIS — B9689 Other specified bacterial agents as the cause of diseases classified elsewhere: Secondary | ICD-10-CM

## 2017-11-04 DIAGNOSIS — N760 Acute vaginitis: Secondary | ICD-10-CM

## 2017-11-04 DIAGNOSIS — R319 Hematuria, unspecified: Secondary | ICD-10-CM

## 2017-11-04 DIAGNOSIS — R109 Unspecified abdominal pain: Secondary | ICD-10-CM | POA: Diagnosis not present

## 2017-11-04 DIAGNOSIS — N898 Other specified noninflammatory disorders of vagina: Secondary | ICD-10-CM | POA: Insufficient documentation

## 2017-11-04 DIAGNOSIS — A599 Trichomoniasis, unspecified: Secondary | ICD-10-CM | POA: Diagnosis not present

## 2017-11-04 DIAGNOSIS — R1030 Lower abdominal pain, unspecified: Secondary | ICD-10-CM

## 2017-11-04 LAB — POCT URINALYSIS DIPSTICK
BILIRUBIN UA: NEGATIVE
Glucose, UA: NEGATIVE
KETONES UA: NEGATIVE
Nitrite, UA: NEGATIVE
PH UA: 7 (ref 5.0–8.0)
SPEC GRAV UA: 1.025 (ref 1.010–1.025)
Urobilinogen, UA: 2 E.U./dL — AB

## 2017-11-04 LAB — WET PREP FOR TRICH, YEAST, CLUE

## 2017-11-04 MED ORDER — METRONIDAZOLE 500 MG PO TABS: 500.0000 mg | ORAL_TABLET | Freq: Two times a day (BID) | ORAL | 0 refills | Status: DC

## 2017-11-04 MED FILL — metroNIDAZOLE 500 MG TABS: 500 | 7 days supply | Qty: 14 | Fill #0
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
## 2017-11-05 LAB — URINE CULTURE
MICRO NUMBER:: 81308531
RESULT: NO GROWTH
SPECIMEN QUALITY:: ADEQUATE

## 2017-11-05 LAB — C. TRACHOMATIS/N. GONORRHOEAE RNA
C. TRACHOMATIS RNA, TMA: NOT DETECTED
N. gonorrhoeae RNA, TMA: NOT DETECTED

## 2017-11-10 ENCOUNTER — Ambulatory Visit (INDEPENDENT_AMBULATORY_CARE_PROVIDER_SITE_OTHER): Payer: 59 | Admitting: Physician Assistant

## 2017-11-10 ENCOUNTER — Encounter: Payer: Self-pay | Admitting: Physician Assistant

## 2017-11-10 VITALS — BP 117/68 | HR 95 | Ht 61.0 in | Wt 173.0 lb

## 2017-11-10 DIAGNOSIS — A599 Trichomoniasis, unspecified: Secondary | ICD-10-CM | POA: Diagnosis not present

## 2017-11-10 DIAGNOSIS — E6609 Other obesity due to excess calories: Secondary | ICD-10-CM | POA: Diagnosis not present

## 2017-11-10 DIAGNOSIS — F411 Generalized anxiety disorder: Secondary | ICD-10-CM

## 2017-11-10 DIAGNOSIS — Z6832 Body mass index (BMI) 32.0-32.9, adult: Secondary | ICD-10-CM

## 2017-11-10 DIAGNOSIS — R209 Unspecified disturbances of skin sensation: Secondary | ICD-10-CM | POA: Diagnosis not present

## 2017-11-10 DIAGNOSIS — F330 Major depressive disorder, recurrent, mild: Secondary | ICD-10-CM | POA: Diagnosis not present

## 2017-11-10 LAB — WET PREP FOR TRICH, YEAST, CLUE
MICRO NUMBER: 81323686
SPECIMEN QUALITY 3963: ADEQUATE
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
## 2017-11-11 ENCOUNTER — Other Ambulatory Visit: Payer: Self-pay | Admitting: *Deleted

## 2017-11-11 DIAGNOSIS — R591 Generalized enlarged lymph nodes: Secondary | ICD-10-CM

## 2017-11-11 MED ORDER — FLUCONAZOLE 150 MG PO TABS: ORAL_TABLET | ORAL | 0 refills | Status: AC

## 2017-11-11 MED FILL — FLUCONAZOLE 150 MG TABLET: 150 | 3 days supply | Qty: 2 | Fill #0

## 2017-11-13 ENCOUNTER — Ambulatory Visit (INDEPENDENT_AMBULATORY_CARE_PROVIDER_SITE_OTHER): Payer: 59

## 2017-11-13 ENCOUNTER — Ambulatory Visit (HOSPITAL_COMMUNITY): Payer: Self-pay | Admitting: Psychiatry

## 2017-11-13 ENCOUNTER — Other Ambulatory Visit: Payer: Self-pay | Admitting: Physician Assistant

## 2017-11-13 DIAGNOSIS — S6992XA Unspecified injury of left wrist, hand and finger(s), initial encounter: Secondary | ICD-10-CM

## 2017-11-13 DIAGNOSIS — X58XXXA Exposure to other specified factors, initial encounter: Secondary | ICD-10-CM | POA: Diagnosis not present

## 2017-11-15 DIAGNOSIS — G478 Other sleep disorders: Secondary | ICD-10-CM | POA: Diagnosis not present

## 2017-11-15 NOTE — Procedures (Signed)
  Patient Name: Kelsey Vaughan, Kelsey Vaughan Date: 10/31/2017 Gender: Female D.O.B: 1976/11/16 Age (years): 41 Referring Provider: Iran Planas Height (inches): 61 Interpreting Physician: Baird Lyons MD, ABSM Weight (lbs): 177 RPSGT: Laren Everts BMI: 33 MRN: [id number] Neck Size: 14.50 CLINICAL INFORMATION Sleep Study Type: NPSG  Indication for sleep study: Fatigue, Obesity, Snoring  Epworth Sleepiness Score: 7  SLEEP STUDY TECHNIQUE As per the AASM Manual for the Scoring of Sleep and Associated Events v2.3 (April 2016) with a hypopnea requiring 4% desaturations.  The channels recorded and monitored were frontal, central and occipital EEG, electrooculogram (EOG), submentalis EMG (chin), nasal and oral airflow, thoracic and abdominal wall motion, anterior tibialis EMG, snore microphone, electrocardiogram, and pulse oximetry.  MEDICATIONS Medications self-administered by patient taken the night of the study : none reported  SLEEP ARCHITECTURE The study was initiated at 10:13:44 PM and ended at 4:25:42 AM.  Sleep onset time was 3.6 minutes and the sleep efficiency was 96.6%. The total sleep time was 359.5 minutes.  Stage REM latency was 101.5 minutes.  The patient spent 4.45% of the night in stage N1 sleep, 73.16% in stage N2 sleep, 0.00% in stage N3 and 22.39% in REM.  Alpha intrusion was absent.  Supine sleep was 66.62%.  RESPIRATORY PARAMETERS The overall apnea/hypopnea index (AHI) was 1.2 per hour. There were 1 total apneas, including 1 obstructive, 0 central and 0 mixed apneas. There were 6 hypopneas and 16 RERAs.  The AHI during Stage REM sleep was 3.7 per hour.  AHI while supine was 1.8 per hour.  The mean oxygen saturation was 96.41%. The minimum SpO2 during sleep was 87.00%.  moderate snoring was noted during this study.  CARDIAC DATA The 2 lead EKG demonstrated sinus rhythm. The mean heart rate was 92.49 beats per minute. Other EKG findings include:  PVCs.  LEG MOVEMENT DATA The total PLMS were 0 with a resulting PLMS index of 0.00. Associated arousal with leg movement index was 0.0 .  IMPRESSIONS - No significant obstructive sleep apnea occurred during this study (AHI = 1.2/h). - No significant central sleep apnea occurred during this study (CAI = 0.0/h). - Mild oxygen desaturation was noted during this study (Min O2 = 87.00%, Mean 96.4%). - The patient snored with moderate snoring volume. - EKG findings include PVCs. - Clinically significant periodic limb movements did not occur during sleep. No significant associated arousals  DIAGNOSIS  - Primary Snoring (786.09 [R06.83 ICD-10])  RECOMMENDATIONS  - Sleep hygiene should be reviewed to assess factors that may improve sleep quality. - Weight management and regular exercise should be initiated or continued if appropriate.  [Electronically signed] 11/15/2017 11:15 AM  Baird Lyons MD, [place], American Board of Sleep Medicine   NPI: [id number]

## 2017-11-17 ENCOUNTER — Other Ambulatory Visit: Payer: Self-pay | Admitting: Physician Assistant

## 2017-11-17 MED ORDER — NYSTATIN 100000 UNIT/ML MT SUSP: 500000.0000 [IU] | Freq: Four times a day (QID) | OROMUCOSAL | 0 refills | Status: DC

## 2017-11-17 NOTE — Progress Notes (Signed)
Pt's thrush improved but still has some white thickness to tongue. Sent nystatin mouthwash.

## 2017-11-20 ENCOUNTER — Encounter: Payer: Self-pay | Admitting: Physician Assistant

## 2017-11-20 DIAGNOSIS — Z6833 Body mass index (BMI) 33.0-33.9, adult: Principal | ICD-10-CM

## 2017-11-20 DIAGNOSIS — E6609 Other obesity due to excess calories: Secondary | ICD-10-CM

## 2017-11-20 MED ORDER — LIRAGLUTIDE -WEIGHT MANAGEMENT 18 MG/3ML ~~LOC~~ SOPN: 3.0000 mg | PEN_INJECTOR | Freq: Every day | SUBCUTANEOUS | 0 refills | Status: DC

## 2017-11-26 ENCOUNTER — Encounter (HOSPITAL_BASED_OUTPATIENT_CLINIC_OR_DEPARTMENT_OTHER): Payer: Self-pay

## 2017-11-27 MED FILL — BYSTOLIC 2.5 MG TABLET: 2.5 | 60 days supply | Qty: 60 | Fill #4

## 2017-11-28 MED FILL — TECHLITE PEN NDL 32GX1/4: 32G X 6 MM | 90 days supply | Qty: 100 | Fill #0

## 2017-11-28 MED FILL — SAXENDA 18 MG/3 ML PEN: 18 | 90 days supply | Qty: 45 | Fill #0

## 2017-11-28 MED FILL — TECHLITE PEN NDL 32GX1/4": 32G X 6 MM | 90 days supply | Qty: 100 | Fill #0

## 2017-12-02 ENCOUNTER — Other Ambulatory Visit: Payer: Self-pay | Admitting: Physician Assistant

## 2017-12-02 DIAGNOSIS — M549 Dorsalgia, unspecified: Secondary | ICD-10-CM

## 2017-12-02 DIAGNOSIS — M542 Cervicalgia: Secondary | ICD-10-CM

## 2017-12-02 DIAGNOSIS — G8929 Other chronic pain: Secondary | ICD-10-CM

## 2017-12-02 DIAGNOSIS — M62838 Other muscle spasm: Secondary | ICD-10-CM

## 2017-12-02 NOTE — Progress Notes (Signed)
Pt request PT for chronic neck pain, upper back muscle spasms/pain.

## 2017-12-11 ENCOUNTER — Ambulatory Visit (INDEPENDENT_AMBULATORY_CARE_PROVIDER_SITE_OTHER): Payer: 59 | Admitting: Family Medicine

## 2017-12-11 VITALS — BP 108/69 | HR 92 | Temp 97.8°F

## 2017-12-11 DIAGNOSIS — R1084 Generalized abdominal pain: Secondary | ICD-10-CM | POA: Diagnosis not present

## 2017-12-11 LAB — POCT URINALYSIS DIPSTICK
BILIRUBIN UA: NEGATIVE
GLUCOSE UA: NEGATIVE
KETONES UA: NEGATIVE
Leukocytes, UA: NEGATIVE
Nitrite, UA: NEGATIVE
Protein, UA: 100
Urobilinogen, UA: 1 E.U./dL
pH, UA: 6.5 (ref 5.0–8.0)

## 2017-12-11 MED ORDER — FLUCONAZOLE 150 MG PO TABS: 150.0000 mg | ORAL_TABLET | Freq: Once | ORAL | 1 refills | Status: AC

## 2017-12-11 MED ORDER — CEPHALEXIN 500 MG PO CAPS: 500.0000 mg | ORAL_CAPSULE | Freq: Two times a day (BID) | ORAL | 0 refills | Status: DC

## 2017-12-11 MED FILL — CEPHALEXIN 500 MG CAPSULE: 500 | 7 days supply | Qty: 14 | Fill #0

## 2017-12-11 MED FILL — FLUCONAZOLE 150 MG TABLET: 150 | 1 days supply | Qty: 1 | Fill #0

## 2017-12-11 NOTE — Patient Instructions (Signed)
Thank you for coming in today. We will get results to you tomorrow likely.  Start kelfex today.  If your belly pain worsens, or you have high fever, bad vomiting, blood in your stool or black tarry stool go to the Emergency Room.

## 2017-12-11 NOTE — Progress Notes (Signed)
Kelsey Vaughan is a 41 y.o. female who presents to [name]: Kelsey Vaughan today for abdominal pain.  [name] notes mild cramping abdominal pain associated with some urinary frequency.  She denies any vaginal discharge vomiting diarrhea or constipation.  She notes that she has a history of benign hematuria that is currently being managed with urology and nephrology.  This is been ongoing for years and has not changed.  Additionally she has a relatively recent history of trichomonas with a test of cure about a month ago.  She denies any vaginal discharge.  Lastly she currently is taking Saxenda for weight loss.  She notes she has been tolerating this medication pretty well with no significant abdominal pain.  Symptoms have been ongoing currently for a few days.  Patient notes that her symptoms are somewhat consistent with previous episodes of urinary tract infection.   Past Medical History:  Diagnosis Date  . Anxiety   . Atrial fibrillation ([place])    a. occuring in 02/2016  . B12 deficiency   . Constipation   . Foreign body in foot, left 07/2013  . GERD (gastroesophageal reflux disease)   . Hematuria   . Irregular menses    due to oral contraceptive  . Palpitations    a. 01/2017: Event monitor showing SR with PVC's.   . Sinus tachycardia   . Thyroid nodule   . Vitamin D deficiency    Past Surgical History:  Procedure Laterality Date  . CESAREAN SECTION    . FOREIGN BODY REMOVAL Left 08/05/2013   Procedure: LEFT FOOT FOREIGN BODY REMOVAL ADULT;  Surgeon: Wylene Simmer, MD;  Location: [facility];  Service: Orthopedics;  Laterality: Left;  . SHOULDER ARTHROSCOPY Left   . TUBAL LIGATION     Social History   Tobacco Use  . Smoking status: Current Every Day Smoker    Years: 6.00    Types: Cigarettes  . Smokeless tobacco: Never Used  . Tobacco comment: 4-5 cigs/day    Substance Use Topics  . Alcohol use: Yes    Comment: occasionally   family history includes CAD in her father; Diabetes in her mother; Hypertension in her mother; Kidney disease in her mother.  ROS as above:  Medications: Current Outpatient Medications  Medication Sig Dispense Refill  . ALPRAZolam (XANAX) 0.5 MG tablet Take 1 tablet (0.5 mg total) by mouth 2 (two) times daily as needed for anxiety. 30 tablet 0  . cycloSPORINE (RESTASIS MULTIDOSE) 0.05 % ophthalmic emulsion Place 1 drop into both eyes 2 (two) times daily. (Patient taking differently: Place 1 drop into both eyes 3 times/day as needed-between meals & bedtime. ) 0.4 mL 2  . desogestrel-ethinyl estradiol (APRI,EMOQUETTE,SOLIA) 0.15-30 MG-MCG tablet Take 1 tablet by mouth daily. 3 Package 4  . hydrOXYzine (ATARAX/VISTARIL) 10 MG tablet Take 1 tablet (10 mg total) by mouth 3 (three) times daily as needed. 30 tablet 0  . Liraglutide -Weight Management (SAXENDA) 18 MG/3ML SOPN Inject 3 mg into the skin daily. Please include ultra fine needles 40mm 15 pen 0  . metoCLOPramide (REGLAN) 10 MG tablet 1 tab PO TID prn Nausea 30 tablet 3  . nebivolol (BYSTOLIC) 2.5 MG tablet Take 1 tablet (2.5 mg total) by mouth daily. 56 tablet 0  . nystatin (MYCOSTATIN) 100000 UNIT/ML suspension Take 5 mLs (500,000 Units total) by mouth 4 (four) times daily. Swish for 30 seconds and spit out. 180 mL 0  . polyethylene glycol  powder (GLYCOLAX/MIRALAX) powder Take 17 g by mouth 2 (two) times daily as needed. 3350 g 11  . SUMAtriptan (IMITREX) 100 MG tablet Take 1 tablet (100 mg total) by mouth once as needed for migraine. May repeat in 2 hours if headache persists or recurs. 10 tablet 1  . valACYclovir (VALTREX) 1000 MG tablet Take 1 tablet (1,000 mg total) by mouth 2 (two) times daily. (Patient taking differently: Take 1,000 mg by mouth as needed. ) 14 tablet 11  . cephALEXin (KEFLEX) 500 MG capsule Take 1 capsule (500 mg total) by mouth 2 (two) times daily.  14 capsule 0   No current facility-administered medications for this visit.    Allergies  Allergen Reactions  . Influenza Vaccines Hives  . Wellbutrin [Bupropion]     Possible nipple soreness/headaches/not feeling right    Health Maintenance Health Maintenance  Topic Date Due  . PAP SMEAR  09/16/2020  . TETANUS/TDAP  04/29/2024  . HIV Screening  Completed  . INFLUENZA VACCINE  Discontinued     Exam:  BP 108/69   Pulse 92   Temp 97.8 F (36.6 C) (Oral)  Gen: Well NAD HEENT: EOMI,  MMM Lungs: Normal work of breathing. CTABL Heart: RRR no MRG Abd: NABS, Soft. Nondistended, Nontender Exts: Brisk capillary refill, warm and well perfused.  GYN exam deferred.  Patient self swab wet prep   Results for orders placed or performed in visit on 12/11/17 (from the past 72 hour(s))  POCT Urinalysis Dipstick     Status: Abnormal   Collection Time: 12/11/17  3:45 PM  Result Value Ref Range   Color, UA yellow    Clarity, UA clear    Glucose, UA neg    Bilirubin, UA neg    Ketones, UA neg    Spec Grav, UA >=1.030 (A) 1.010 - 1.025   Blood, UA large    pH, UA 6.5 5.0 - 8.0   Protein, UA 100    Urobilinogen, UA 1.0 0.2 or 1.0 E.U./dL   Nitrite, UA neg    Leukocytes, UA Negative Negative   Appearance     Odor    Urinalysis, microscopic only     Status: Abnormal   Collection Time: 12/11/17  3:50 PM  Result Value Ref Range   WBC, UA 6-10 (A) 0 - 5 /HPF   RBC / HPF 3-10 (A) 0 - 2 /HPF   Squamous Epithelial / LPF 0-5 < OR = 5 /HPF   Bacteria, UA NONE SEEN NONE SEEN /HPF   Calcium Oxalate Crystal FEW NONE OR FE /HPF   Hyaline Cast NONE SEEN NONE SEEN /LPF  CBC     Status: Abnormal   Collection Time: 12/11/17  4:11 PM  Result Value Ref Range   WBC 12.1 (H) 3.8 - 10.8 Thousand/uL   RBC 4.24 3.80 - 5.10 Million/uL   Hemoglobin 12.3 11.7 - 15.5 g/dL   HCT 35.6 35.0 - 45.0 %   MCV 84.0 80.0 - 100.0 fL   MCH 29.0 27.0 - 33.0 pg   MCHC 34.6 32.0 - 36.0 g/dL   RDW 13.6 11.0 -  15.0 %   Platelets 294 140 - 400 Thousand/uL   MPV 11.9 7.5 - 12.5 fL  COMPLETE METABOLIC PANEL WITH GFR     Status: None   Collection Time: 12/11/17  4:11 PM  Result Value Ref Range   Glucose, Bld 95 65 - 99 mg/dL    Comment: .  Fasting reference interval .    BUN 15 7 - 25 mg/dL   Creat 0.87 0.50 - 1.10 mg/dL   GFR, Est Non African American 83 > OR = 60 mL/min/1.47m2   GFR, Est African American 96 > OR = 60 mL/min/1.36m2   BUN/Creatinine Ratio NOT APPLICABLE 6 - 22 (calc)   Sodium 140 135 - 146 mmol/L   Potassium 3.9 3.5 - 5.3 mmol/L   Chloride 104 98 - 110 mmol/L   CO2 27 20 - 32 mmol/L   Calcium 8.9 8.6 - 10.2 mg/dL   Total Protein 7.0 6.1 - 8.1 g/dL   Albumin 3.9 3.6 - 5.1 g/dL   Globulin 3.1 1.9 - 3.7 g/dL (calc)   AG Ratio 1.3 1.0 - 2.5 (calc)   Total Bilirubin 0.2 0.2 - 1.2 mg/dL   Alkaline phosphatase (APISO) 61 33 - 115 U/L   AST 14 10 - 30 U/L   ALT 17 6 - 29 U/L  Lipase     Status: Abnormal   Collection Time: 12/11/17  4:12 PM  Result Value Ref Range   Lipase 107 (H) 7 - 60 U/L   No results found.    Assessment and Plan: 41 y.o. female with abdominal discomfort associated with history of benign hematuria and current Saxenda medication.   Labs are significant for mildly elevated lipase. Plan to hold Saxenda and treat empirically for a possible urinary tract infection with Keflex.  Will also have backup fluconazole in case she develops a yeast infection. Urine culture is pending.  Recheck in the near future if not improving.   Orders Placed This Encounter  Procedures  . Urine Culture  . WET PREP FOR [place], YEAST, CLUE  . Urinalysis, microscopic only  . CBC  . COMPLETE METABOLIC PANEL WITH GFR  . Lipase  . POCT Urinalysis Dipstick   Meds ordered this encounter  Medications  . cephALEXin (KEFLEX) 500 MG capsule    Sig: Take 1 capsule (500 mg total) by mouth 2 (two) times daily.    Dispense:  14 capsule    Refill:  0  . fluconazole  (DIFLUCAN) 150 MG tablet    Sig: Take 1 tablet (150 mg total) by mouth once for 1 dose.    Dispense:  1 tablet    Refill:  1     Discussed warning signs or symptoms. Please see discharge instructions. Patient expresses understanding.

## 2017-12-12 LAB — CBC
HEMATOCRIT: 35.6 % (ref 35.0–45.0)
HEMOGLOBIN: 12.3 g/dL (ref 11.7–15.5)
MCH: 29 pg (ref 27.0–33.0)
MCHC: 34.6 g/dL (ref 32.0–36.0)
MCV: 84 fL (ref 80.0–100.0)
MPV: 11.9 fL (ref 7.5–12.5)
Platelets: 294 10*3/uL (ref 140–400)
RBC: 4.24 10*6/uL (ref 3.80–5.10)
RDW: 13.6 % (ref 11.0–15.0)
WBC: 12.1 10*3/uL — AB (ref 3.8–10.8)

## 2017-12-12 LAB — WET PREP FOR TRICH, YEAST, CLUE
MICRO NUMBER:: 81453918
Specimen Quality: ADEQUATE

## 2017-12-12 LAB — URINALYSIS, MICROSCOPIC ONLY
BACTERIA UA: NONE SEEN /HPF
Hyaline Cast: NONE SEEN /LPF

## 2017-12-12 LAB — COMPLETE METABOLIC PANEL WITH GFR
AG RATIO: 1.3 (calc) (ref 1.0–2.5)
ALBUMIN MSPROF: 3.9 g/dL (ref 3.6–5.1)
ALKALINE PHOSPHATASE (APISO): 61 U/L (ref 33–115)
ALT: 17 U/L (ref 6–29)
AST: 14 U/L (ref 10–30)
BUN: 15 mg/dL (ref 7–25)
CO2: 27 mmol/L (ref 20–32)
CREATININE: 0.87 mg/dL (ref 0.50–1.10)
Calcium: 8.9 mg/dL (ref 8.6–10.2)
Chloride: 104 mmol/L (ref 98–110)
GFR, Est African American: 96 mL/min/{1.73_m2} (ref >=60)
GFR, Est Non African American: 83 mL/min/{1.73_m2} (ref >=60)
GLOBULIN: 3.1 g/dL (ref 1.9–3.7)
Glucose, Bld: 95 mg/dL (ref 65–99)
POTASSIUM: 3.9 mmol/L (ref 3.5–5.3)
SODIUM: 140 mmol/L (ref 135–146)
Total Bilirubin: 0.2 mg/dL (ref 0.2–1.2)
Total Protein: 7 g/dL (ref 6.1–8.1)

## 2017-12-12 LAB — LIPASE: LIPASE: 107 U/L — AB (ref 7–60)

## 2017-12-13 LAB — URINE CULTURE
MICRO NUMBER:: 81452663
SPECIMEN QUALITY: ADEQUATE

## 2017-12-15 MED FILL — valACYclovir HCL 1 GM TABS: 1 | 7 days supply | Qty: 14 | Fill #1

## 2017-12-15 MED FILL — RESTASIS MULTIDOSE 0.05% EY: 0.05 | 30 days supply | Qty: 6 | Fill #1

## 2017-12-15 MED FILL — METOCLOPRAMIDE 10 MG TABLET: 10 | 10 days supply | Qty: 30 | Fill #1

## 2017-12-15 MED FILL — SUMATRIPTAN SUCC 100 MG TAB: 100 | 30 days supply | Qty: 8 | Fill #0

## 2017-12-17 ENCOUNTER — Telehealth: Payer: Self-pay | Admitting: Physician Assistant

## 2017-12-17 DIAGNOSIS — R1084 Generalized abdominal pain: Secondary | ICD-10-CM

## 2017-12-17 DIAGNOSIS — R748 Abnormal levels of other serum enzymes: Secondary | ICD-10-CM

## 2017-12-17 DIAGNOSIS — D72829 Elevated white blood cell count, unspecified: Secondary | ICD-10-CM

## 2017-12-17 NOTE — Telephone Encounter (Signed)
1 week ago elevated WBC and lipase. Need to recheck. Pt has stopped saxenda. Continues to have some mild epigastric discomfort.

## 2017-12-18 ENCOUNTER — Encounter: Payer: Self-pay | Admitting: Rehabilitative and Restorative Service Providers"

## 2017-12-18 ENCOUNTER — Ambulatory Visit (INDEPENDENT_AMBULATORY_CARE_PROVIDER_SITE_OTHER): Payer: 59 | Admitting: Rehabilitative and Restorative Service Providers"

## 2017-12-18 DIAGNOSIS — R293 Abnormal posture: Secondary | ICD-10-CM | POA: Diagnosis not present

## 2017-12-18 DIAGNOSIS — R29898 Other symptoms and signs involving the musculoskeletal system: Secondary | ICD-10-CM | POA: Diagnosis not present

## 2017-12-18 DIAGNOSIS — M542 Cervicalgia: Secondary | ICD-10-CM

## 2017-12-18 DIAGNOSIS — M546 Pain in thoracic spine: Secondary | ICD-10-CM | POA: Diagnosis not present
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
## 2017-12-18 NOTE — Patient Instructions (Signed)
Self massage using ~ 3 inch plastic ball   Axial Extension (Chin Tuck)    Pull chin in and lengthen back of neck. Hold __5__ seconds while counting out loud. Repeat __10__ times. Do __several__ sessions per day.  Shoulder Blade Squeeze    Rotate shoulders back, then squeeze shoulder blades down and back. Hold 10 sec Repeat __10__ times. Do __several__ sessions per day.  Upper Back Strength: Lower Trapezius / Rotator Cuff " L's "     Arms in waitress pose, palms up. Press hands back and slide shoulder blades down. Hold for __5__ seconds. Repeat _10___ times. 1-2 times per day.    Scapular Retraction: Elbow Flexion (Standing)  "W's"     With elbows bent to 90, pinch shoulder blades together and rotate arms out, keeping elbows bent. Repeat __10__ times per set. Do __1-2__ sets per session. Do _several ___ sessions per day.   Scapula Adduction With Pectoralis Stretch: Low - Standing   Shoulders at 45 hands even with shoulders, keeping weight through legs, shift weight forward until you feel pull or stretch through the front of your chest. Hold _30__ seconds. Do _3__ times, _2-4__ times per day.   Scapula Adduction With Pectoralis Stretch: Mid-Range - Standing   Shoulders at 90 elbows even with shoulders, keeping weight through legs, shift weight forward until you feel pull or strength through the front of your chest. Hold __30_ seconds. Do _3__ times, __2-4_ times per day.   Scapula Adduction With Pectoralis Stretch: High - Standing   Shoulders at 120 hands up high on the doorway, keeping weight on feet, shift weight forward until you feel pull or stretch through the front of your chest. Hold _30__ seconds. Do _3__ times, _2-3__ times per day.   [facility] [place] [place], [place] [address]  [phone] (office) [phone] (fax)  TENS UNIT: This is helpful for muscle pain and spasm.   Search  and Purchase a TENS 7000 2nd edition at www.tenspros.com. It should be less than $30.     TENS unit instructions: Do not shower or bathe with the unit on Turn the unit off before removing electrodes or batteries If the electrodes lose stickiness add a drop of water to the electrodes after they are disconnected from the unit and place on plastic sheet. If you continued to have difficulty, call the TENS unit company to purchase more electrodes. Do not apply lotion on the skin area prior to use. Make sure the skin is clean and dry as this will help prolong the life of the electrodes. After use, always check skin for unusual red areas, rash or other skin difficulties. If there are any skin problems, does not apply electrodes to the same area. Never remove the electrodes from the unit by pulling the wires. Do not use the TENS unit or electrodes other than as directed. Do not change electrode placement without consultating your therapist or physician. Keep 2 fingers with between each electrode.

## 2017-12-19 DIAGNOSIS — R748 Abnormal levels of other serum enzymes: Secondary | ICD-10-CM | POA: Diagnosis not present

## 2017-12-19 DIAGNOSIS — R1084 Generalized abdominal pain: Secondary | ICD-10-CM | POA: Diagnosis not present

## 2017-12-19 DIAGNOSIS — D72829 Elevated white blood cell count, unspecified: Secondary | ICD-10-CM | POA: Diagnosis not present

## 2017-12-19 LAB — CBC WITH DIFFERENTIAL/PLATELET
BASOS PCT: 0.3 %
Basophils Absolute: 26 cells/uL (ref 0–200)
EOS PCT: 1.2 %
Eosinophils Absolute: 106 cells/uL (ref 15–500)
HCT: 37.4 % (ref 35.0–45.0)
Hemoglobin: 12.6 g/dL (ref 11.7–15.5)
Lymphs Abs: 3256 cells/uL (ref 850–3900)
MCH: 28.4 pg (ref 27.0–33.0)
MCHC: 33.7 g/dL (ref 32.0–36.0)
MCV: 84.2 fL (ref 80.0–100.0)
MPV: 12.4 fL (ref 7.5–12.5)
Monocytes Relative: 7.6 %
Neutro Abs: 4743 cells/uL (ref 1500–7800)
Neutrophils Relative %: 53.9 %
PLATELETS: 295 10*3/uL (ref 140–400)
RBC: 4.44 10*6/uL (ref 3.80–5.10)
RDW: 14 % (ref 11.0–15.0)
TOTAL LYMPHOCYTE: 37 %
WBC mixed population: 669 cells/uL (ref 200–950)
WBC: 8.8 10*3/uL (ref 3.8–10.8)

## 2017-12-19 LAB — LIPASE: Lipase: 66 U/L — ABNORMAL HIGH (ref 7–60)

## 2017-12-24 ENCOUNTER — Encounter: Payer: Self-pay | Admitting: Physician Assistant

## 2017-12-24 ENCOUNTER — Ambulatory Visit (INDEPENDENT_AMBULATORY_CARE_PROVIDER_SITE_OTHER): Payer: 59 | Admitting: Physician Assistant

## 2017-12-24 VITALS — BP 126/66 | HR 94 | Ht 61.0 in | Wt 174.0 lb

## 2017-12-24 DIAGNOSIS — R4586 Emotional lability: Secondary | ICD-10-CM | POA: Insufficient documentation

## 2017-12-24 DIAGNOSIS — F330 Major depressive disorder, recurrent, mild: Secondary | ICD-10-CM

## 2017-12-24 DIAGNOSIS — R454 Irritability and anger: Secondary | ICD-10-CM

## 2017-12-24 NOTE — Patient Instructions (Signed)
I will email psychiatry. Call and get outpatient therapy.
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
## 2017-12-25 ENCOUNTER — Other Ambulatory Visit (HOSPITAL_COMMUNITY): Payer: Self-pay | Admitting: Psychiatry

## 2017-12-25 ENCOUNTER — Telehealth: Payer: Self-pay | Admitting: Family Medicine

## 2017-12-25 DIAGNOSIS — F329 Major depressive disorder, single episode, unspecified: Secondary | ICD-10-CM

## 2017-12-25 DIAGNOSIS — F419 Anxiety disorder, unspecified: Secondary | ICD-10-CM

## 2017-12-25 DIAGNOSIS — F32A Depression, unspecified: Secondary | ICD-10-CM

## 2017-12-25 NOTE — Telephone Encounter (Signed)
Refer LCSW

## 2017-12-30 ENCOUNTER — Encounter: Payer: Self-pay | Admitting: Physician Assistant

## 2017-12-30 DIAGNOSIS — E559 Vitamin D deficiency, unspecified: Secondary | ICD-10-CM

## 2017-12-30 DIAGNOSIS — E538 Deficiency of other specified B group vitamins: Secondary | ICD-10-CM

## 2017-12-30 DIAGNOSIS — R748 Abnormal levels of other serum enzymes: Secondary | ICD-10-CM

## 2018-01-02 ENCOUNTER — Encounter: Payer: Self-pay | Admitting: Physician Assistant

## 2018-01-02 ENCOUNTER — Ambulatory Visit (INDEPENDENT_AMBULATORY_CARE_PROVIDER_SITE_OTHER): Payer: 59 | Admitting: Physician Assistant

## 2018-01-02 VITALS — BP 107/62 | HR 100 | Temp 98.4°F

## 2018-01-02 DIAGNOSIS — E559 Vitamin D deficiency, unspecified: Secondary | ICD-10-CM | POA: Diagnosis not present

## 2018-01-02 DIAGNOSIS — R748 Abnormal levels of other serum enzymes: Secondary | ICD-10-CM | POA: Diagnosis not present

## 2018-01-02 DIAGNOSIS — E538 Deficiency of other specified B group vitamins: Secondary | ICD-10-CM | POA: Diagnosis not present

## 2018-01-02 DIAGNOSIS — R519 Headache, unspecified: Secondary | ICD-10-CM

## 2018-01-02 DIAGNOSIS — R51 Headache: Secondary | ICD-10-CM

## 2018-01-02 MED ORDER — KETOROLAC TROMETHAMINE 60 MG/2ML IM SOLN
60.0000 mg | Freq: Once | INTRAMUSCULAR | Status: AC
  Administered 2018-01-02: 60 mg via INTRAMUSCULAR

## 2018-01-03 LAB — LIPASE: Lipase: 63 U/L — ABNORMAL HIGH (ref 7–60)

## 2018-01-03 LAB — VITAMIN D 25 HYDROXY (VIT D DEFICIENCY, FRACTURES): VIT D 25 HYDROXY: 28 ng/mL — AB (ref 30–100)

## 2018-01-03 LAB — VITAMIN B12: VITAMIN B 12: 328 pg/mL (ref 200–1100)

## 2018-01-03 NOTE — Progress Notes (Signed)
Pt has a cavity that needs root canal. She has procedure planned for a little over a week. It is causing a lot of left jaw pain and headache. Today her headache is bad. She request injection. She has been using excedrin and does help but not used any today.   [name] KitchenSuanne Marker was seen today for headache.  Diagnoses and all orders for this visit:  Nonintractable headache, unspecified chronicity pattern, unspecified headache type -     ketorolac (TORADOL) injection 60 mg

## 2018-01-05 ENCOUNTER — Ambulatory Visit: Payer: Self-pay | Admitting: Physician Assistant

## 2018-01-05 ENCOUNTER — Other Ambulatory Visit: Payer: Self-pay | Admitting: Physician Assistant

## 2018-01-05 MED ORDER — ERGOCALCIFEROL 1.25 MG (50000 UT) PO CAPS: 50000.0000 [IU] | ORAL_CAPSULE | ORAL | 0 refills | Status: DC

## 2018-01-05 MED FILL — VIT D2 1.25 MG (50,000 UNIT: 1.25 MG | 84 days supply | Qty: 12 | Fill #0

## 2018-01-07 MED FILL — JULEBER 0.15-30 MG-MCG TABS: 0.15-30 | 84 days supply | Qty: 84 | Fill #1

## 2018-01-21 ENCOUNTER — Encounter: Payer: Self-pay | Admitting: Physician Assistant

## 2018-01-22 ENCOUNTER — Ambulatory Visit (INDEPENDENT_AMBULATORY_CARE_PROVIDER_SITE_OTHER): Payer: 59 | Admitting: Family Medicine

## 2018-01-22 ENCOUNTER — Ambulatory Visit: Payer: Self-pay | Admitting: Osteopathic Medicine

## 2018-01-22 VITALS — BP 119/78 | HR 86 | Ht 61.0 in | Wt 171.0 lb

## 2018-01-22 DIAGNOSIS — R51 Headache: Secondary | ICD-10-CM

## 2018-01-22 DIAGNOSIS — R519 Headache, unspecified: Secondary | ICD-10-CM

## 2018-01-22 MED ORDER — KETOROLAC TROMETHAMINE 60 MG/2ML IM SOLN
60.0000 mg | Freq: Once | INTRAMUSCULAR | Status: AC
  Administered 2018-01-22: 60 mg via INTRAMUSCULAR

## 2018-01-22 NOTE — Progress Notes (Signed)
   Subjective:    Patient ID: Kelsey Vaughan, female    DOB: 02/16/1976, 42 y.o.   MRN: [id number]  HPI  Female with a history of migraine headaches comes in today for acute treatment of headache.  Patient preferring Toradol injection.  Review of Systems     Objective:   Physical Exam        Assessment & Plan:  Migraine headache-Toradol given.  Patient tolerated well.  Follow-up with PCP if not improving in the next 2-3 days.

## 2018-01-22 NOTE — Progress Notes (Signed)
Pt given Toradol 60mg  without complications for headache.  Ok per [employer].

## 2018-01-29 MED FILL — PENICILLIN VK 500 MG TABLET: 500 | 10 days supply | Qty: 30 | Fill #0

## 2018-02-06 ENCOUNTER — Other Ambulatory Visit: Payer: Self-pay | Admitting: Physician Assistant

## 2018-02-06 DIAGNOSIS — Z6833 Body mass index (BMI) 33.0-33.9, adult: Principal | ICD-10-CM

## 2018-02-06 DIAGNOSIS — E6609 Other obesity due to excess calories: Secondary | ICD-10-CM

## 2018-02-06 DIAGNOSIS — Z139 Encounter for screening, unspecified: Secondary | ICD-10-CM

## 2018-02-06 MED ORDER — LIRAGLUTIDE -WEIGHT MANAGEMENT 18 MG/3ML ~~LOC~~ SOPN: 3.0000 mg | PEN_INJECTOR | Freq: Every day | SUBCUTANEOUS | 0 refills | Status: DC

## 2018-02-06 NOTE — Telephone Encounter (Signed)
Received fax from [facility] that [place] was approved from 02/04/2018 until 02/04/2019. Pharmacy notified. Form sent to scan.  Reference number: [id number]

## 2018-02-10 ENCOUNTER — Ambulatory Visit (INDEPENDENT_AMBULATORY_CARE_PROVIDER_SITE_OTHER): Payer: 59 | Admitting: Physician Assistant

## 2018-02-10 ENCOUNTER — Encounter (HOSPITAL_COMMUNITY): Payer: Self-pay | Admitting: Physician Assistant

## 2018-02-10 VITALS — BP 117/70 | HR 91 | Ht 61.0 in | Wt 173.0 lb

## 2018-02-10 DIAGNOSIS — E538 Deficiency of other specified B group vitamins: Secondary | ICD-10-CM

## 2018-02-10 MED ORDER — CYANOCOBALAMIN 1000 MCG/ML IJ SOLN
1000.0000 ug | Freq: Once | INTRAMUSCULAR | Status: AC
  Administered 2018-02-10: 1000 ug via INTRAMUSCULAR

## 2018-02-10 NOTE — Progress Notes (Signed)
Pt here for b12 injection.  Given RD without immediate complications.  Pt advised to get next injection one month from now.

## 2018-02-17 ENCOUNTER — Ambulatory Visit (INDEPENDENT_AMBULATORY_CARE_PROVIDER_SITE_OTHER): Payer: 59 | Admitting: Family Medicine

## 2018-02-17 ENCOUNTER — Other Ambulatory Visit: Payer: Self-pay | Admitting: Physician Assistant

## 2018-02-17 ENCOUNTER — Encounter: Payer: Self-pay | Admitting: Family Medicine

## 2018-02-17 VITALS — BP 107/74 | HR 99 | Temp 97.6°F | Wt 174.0 lb

## 2018-02-17 DIAGNOSIS — F430 Acute stress reaction: Secondary | ICD-10-CM

## 2018-02-17 DIAGNOSIS — I8002 Phlebitis and thrombophlebitis of superficial vessels of left lower extremity: Secondary | ICD-10-CM

## 2018-02-17 DIAGNOSIS — F419 Anxiety disorder, unspecified: Secondary | ICD-10-CM

## 2018-02-17 MED ORDER — ALPRAZOLAM 0.5 MG PO TABS: 0.5000 mg | ORAL_TABLET | Freq: Two times a day (BID) | ORAL | 0 refills | Status: DC | PRN

## 2018-02-17 MED FILL — ALPRAZolam 0.5 MG TABS: 0.5 | 15 days supply | Qty: 30 | Fill #0

## 2018-02-17 NOTE — Progress Notes (Signed)
Kelsey Vaughan is a 42 y.o. female who presents to [place]: [place] today for left posterior knee tenderness.  Antonietta notes tenderness at the left posterior knee starting yesterday. She notes the pain is improving today but still present. She denies any pain with activity but notes that the pain is worse with pressure and palpation. She denies any injury. She has not tried any treatment yet. She denies any history of DVT or PE. She denies any shortness of breath or chest pain.    Past Medical History:  Diagnosis Date  . Anxiety   . Atrial fibrillation ([place])    a. occuring in 02/2016  . B12 deficiency   . Constipation   . Foreign body in foot, left 07/2013  . GERD (gastroesophageal reflux disease)   . Hematuria   . Irregular menses    due to oral contraceptive  . Palpitations    a. 01/2017: Event monitor showing SR with PVC's.   . Sinus tachycardia   . Thyroid nodule   . Vitamin D deficiency    Past Surgical History:  Procedure Laterality Date  . CESAREAN SECTION    . FOREIGN BODY REMOVAL Left 08/05/2013   Procedure: LEFT FOOT FOREIGN BODY REMOVAL ADULT;  Surgeon: Wylene Simmer, MD;  Location: [facility];  Service: Orthopedics;  Laterality: Left;  . SHOULDER ARTHROSCOPY Left   . TUBAL LIGATION     Social History   Tobacco Use  . Smoking status: Current Every Day Smoker    Years: 6.00    Types: Cigarettes  . Smokeless tobacco: Never Used  . Tobacco comment: 4-5 cigs/day  Substance Use Topics  . Alcohol use: Yes    Comment: occasionally   family history includes CAD in her father; Diabetes in her mother; Hypertension in her mother; Kidney disease in her mother.  ROS as above:  Medications: Current Outpatient Medications  Medication Sig Dispense Refill  . ALPRAZolam (XANAX) 0.5 MG tablet Take 1 tablet (0.5 mg total) by mouth 2 (two) times  daily as needed for anxiety. 30 tablet 0  . ALPRAZolam (XANAX) 0.5 MG tablet Take 1 tablet (0.5 mg total) by mouth 2 (two) times daily as needed for anxiety. 30 tablet 0  . cycloSPORINE (RESTASIS MULTIDOSE) 0.05 % ophthalmic emulsion Place 1 drop into both eyes 2 (two) times daily. (Patient taking differently: Place 1 drop into both eyes 3 times/day as needed-between meals & bedtime. ) 0.4 mL 2  . desogestrel-ethinyl estradiol (APRI,EMOQUETTE,SOLIA) 0.15-30 MG-MCG tablet Take 1 tablet by mouth daily. 3 Package 4  . ergocalciferol (VITAMIN D2) 50000 units capsule Take 1 capsule (50,000 Units total) by mouth once a week. 12 capsule 0  . hydrOXYzine (ATARAX/VISTARIL) 10 MG tablet Take 1 tablet (10 mg total) by mouth 3 (three) times daily as needed. 30 tablet 0  . Liraglutide -Weight Management (SAXENDA) 18 MG/3ML SOPN Inject 3 mg into the skin daily. Please include ultra fine needles 54mm 15 pen 0  . metoCLOPramide (REGLAN) 10 MG tablet 1 tab PO TID prn Nausea 30 tablet 3  . nebivolol (BYSTOLIC) 2.5 MG tablet Take 1 tablet (2.5 mg total) by mouth daily. 56 tablet 0  . nystatin (MYCOSTATIN) 100000 UNIT/ML suspension Take 5 mLs (500,000 Units total) by mouth 4 (four) times daily. Swish for 30 seconds and spit out. 180 mL 0  . polyethylene glycol powder (GLYCOLAX/MIRALAX) powder Take 17 g by mouth 2 (two) times daily as needed.  3350 g 11  . SUMAtriptan (IMITREX) 100 MG tablet Take 1 tablet (100 mg total) by mouth once as needed for migraine. May repeat in 2 hours if headache persists or recurs. 10 tablet 1  . valACYclovir (VALTREX) 1000 MG tablet Take 1 tablet (1,000 mg total) by mouth 2 (two) times daily. (Patient taking differently: Take 1,000 mg by mouth as needed. ) 14 tablet 11   No current facility-administered medications for this visit.    Allergies  Allergen Reactions  . Influenza Vaccines Hives  . Wellbutrin [Bupropion]     Possible nipple soreness/headaches/not feeling right    Health  Maintenance Health Maintenance  Topic Date Due  . PAP SMEAR  09/16/2020  . TETANUS/TDAP  04/29/2024  . HIV Screening  Completed  . INFLUENZA VACCINE  Discontinued     Exam:  BP 107/74   Pulse 99   Temp 97.6 F (36.4 C) (Oral)   Wt 174 lb 0.6 oz (78.9 kg)   BMI 32.88 kg/m  Gen: Well NAD Left knee: Normal appearing anterior knee with no effusion.  Small red tender raised area posterior medial knee seen below.  ROM 1-120 deg Nontender except as noted above.  Stable ligament exam.  Neg Mcmurray exam.  Intact flexion and extension strength.  Normal Gait.  Calves bilaterally are nontender and equal-size with no swelling  Limited US exam posterior knee: Normal posterior medial knee structures.  Normal semi-tendinosis and member gnosis tendons.  No Baker's cyst present.  Compressible deep and superficial veins present.  Normal appearance of arteries.  Normal bony structures.     No results found for this or any previous visit (from the past 72 hour(s)). No results found.    Assessment and Plan: 42 y.o. female with all tender area at the posterior knee is likely superficial thrombophlebitis.  Very doubtful for deep vein thrombosis given calves are nontender and are equal in diameter. The musculoskeletal ultrasound of the posterior knee is unremarkable.  Fortunately Kelsey Vaughan seems to be improving.  I think it is reasonable to proceed with watchful waiting.  Additionally topical diclofenac gel and oral Tylenol is reasonable along with compression and ice.  If worsening or not improving next step would likely be duplex ultrasounds.  Recheck tomorrow in clinic   No orders of the defined types were placed in this encounter.  No orders of the defined types were placed in this encounter.    Discussed warning signs or symptoms. Please see discharge instructions. Patient expresses understanding.

## 2018-02-17 NOTE — Patient Instructions (Signed)
Thank you for coming in today. This may be a superficial blood clot which are typically dangerous.  I do not think you have a deep vein blood clot.  We will follow it along over the next few days.  Use compression topical Voltaren gel, oral ibuprofen as needed.  If worse next step would be DVT evaluation and recheck.    Phlebitis Phlebitis is soreness and puffiness (swelling) in a vein. Follow these instructions at home:  Only take medicine as told by your doctor.  Raise (elevate) the affected limb on a pillow as told by your doctor.  Keep a warm pack on the affected vein as told by your doctor. Do not sleep with a heating pad.  Use special stockings or bandages around the area of the affected vein as told by your doctor. These will speed healing and keep the condition from coming back.  Talk to your doctor about all the medicines you take.  Get follow-up blood tests as told by your doctor.  If the phlebitis is in your legs: ? Avoid standing or resting for long periods. ? Keep your legs moving. Raise your legs when you sit or lie.  Do not smoke.  Follow-up with your doctor as told. Contact a doctor if:  You have strange bruises or bleeding.  Your puffiness or pain in the affected area is not getting better.  You are taking medicine to lessen puffiness (anti-inflammatory medicine), and you get belly pain.  You have a fever. Get help right away if:  The phlebitis gets worse and you have more pain, puffiness (swelling), or redness.  You have trouble breathing or have chest pain. This information is not intended to replace advice given to you by your health care provider. Make sure you discuss any questions you have with your health care provider. Document Released: 11/20/2009 Document Revised: 05/09/2016 Document Reviewed: 08/09/2013 Elsevier Interactive Patient Education  2017 [employer].

## 2018-02-20 ENCOUNTER — Telehealth: Payer: Self-pay | Admitting: Physician Assistant

## 2018-02-20 ENCOUNTER — Ambulatory Visit (INDEPENDENT_AMBULATORY_CARE_PROVIDER_SITE_OTHER): Payer: 59 | Admitting: Physician Assistant

## 2018-02-20 ENCOUNTER — Encounter: Payer: Self-pay | Admitting: Physician Assistant

## 2018-02-20 VITALS — BP 146/79 | HR 99

## 2018-02-20 DIAGNOSIS — R454 Irritability and anger: Secondary | ICD-10-CM

## 2018-02-20 DIAGNOSIS — F330 Major depressive disorder, recurrent, mild: Secondary | ICD-10-CM

## 2018-02-20 DIAGNOSIS — F411 Generalized anxiety disorder: Secondary | ICD-10-CM

## 2018-02-20 MED ORDER — VORTIOXETINE HBR 5 MG PO TABS: 5.0000 mg | ORAL_TABLET | Freq: Every day | ORAL | 1 refills | Status: DC

## 2018-02-20 NOTE — Telephone Encounter (Signed)
Received fax from [employer] that [employer] requires a PA. Information has been sent to the insurance company. Awaiting determination.   
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
## 2018-02-23 DIAGNOSIS — R454 Irritability and anger: Secondary | ICD-10-CM | POA: Insufficient documentation

## 2018-02-24 NOTE — Telephone Encounter (Signed)
Received fax from [place] that [employer] was approved from 02/23/2018 until 02/23/2019. Pharmacy notified and forms sent to scan.   Reference ID: [id number].

## 2018-02-25 ENCOUNTER — Other Ambulatory Visit: Payer: Self-pay | Admitting: Physician Assistant

## 2018-02-25 MED ORDER — LEVOCETIRIZINE DIHYDROCHLORIDE 5 MG PO TABS: 5.0000 mg | ORAL_TABLET | Freq: Every evening | ORAL | 1 refills | Status: DC

## 2018-02-25 NOTE — Progress Notes (Signed)
x

## 2018-02-26 ENCOUNTER — Encounter: Payer: Self-pay | Admitting: Family Medicine

## 2018-02-26 ENCOUNTER — Ambulatory Visit (INDEPENDENT_AMBULATORY_CARE_PROVIDER_SITE_OTHER): Payer: 59 | Admitting: Family Medicine

## 2018-02-26 VITALS — BP 117/77 | HR 98 | Temp 97.7°F | Wt 172.0 lb

## 2018-02-26 DIAGNOSIS — G43101 Migraine with aura, not intractable, with status migrainosus: Secondary | ICD-10-CM

## 2018-02-26 MED ORDER — KETOROLAC TROMETHAMINE 60 MG/2ML IM SOLN: 60.0000 mg | Freq: Once | INTRAMUSCULAR | Status: DC

## 2018-02-26 MED ORDER — KETOROLAC TROMETHAMINE 60 MG/2ML IM SOLN
60.0000 mg | Freq: Once | INTRAMUSCULAR | Status: AC
  Administered 2018-02-26: 60 mg via INTRAMUSCULAR

## 2018-02-26 MED ORDER — DEXAMETHASONE SODIUM PHOSPHATE 10 MG/ML IJ SOLN: 10.0000 mg | Freq: Once | INTRAMUSCULAR | Status: DC

## 2018-02-26 MED ORDER — DEXAMETHASONE SODIUM PHOSPHATE 10 MG/ML IJ SOLN
10.0000 mg | Freq: Once | INTRAMUSCULAR | Status: AC
  Administered 2018-02-26: 10 mg via INTRAMUSCULAR

## 2018-02-26 NOTE — Progress Notes (Signed)
Kelsey Vaughan is a 42 y.o. female who presents to [place]: [place] today for migraine headache.  Kelsey Vaughan has a history of migraines and notes a onset of migraine today with a visual aura.  She took her Imitrex and is feeling a bit better but does note a pounding headache.  She denies any weakness or numbness or loss of function.  She estimates 1-2 migraine headaches per month but is not 100% sure.  She denies any vomiting chest pain or palpitations.  She does note photophobia.   Past Medical History:  Diagnosis Date  . Anxiety   . Atrial fibrillation ([place])    a. occuring in 02/2016  . B12 deficiency   . Constipation   . Foreign body in foot, left 07/2013  . GERD (gastroesophageal reflux disease)   . Hematuria   . Irregular menses    due to oral contraceptive  . Palpitations    a. 01/2017: Event monitor showing SR with PVC's.   . Sinus tachycardia   . Thyroid nodule   . Vitamin D deficiency    Past Surgical History:  Procedure Laterality Date  . CESAREAN SECTION    . FOREIGN BODY REMOVAL Left 08/05/2013   Procedure: LEFT FOOT FOREIGN BODY REMOVAL ADULT;  Surgeon: Wylene Simmer, MD;  Location: [facility];  Service: Orthopedics;  Laterality: Left;  . SHOULDER ARTHROSCOPY Left   . TUBAL LIGATION     Social History   Tobacco Use  . Smoking status: Current Every Day Smoker    Years: 6.00    Types: Cigarettes  . Smokeless tobacco: Never Used  . Tobacco comment: 4-5 cigs/day  Substance Use Topics  . Alcohol use: Yes    Comment: occasionally   family history includes CAD in her father; Diabetes in her mother; Hypertension in her mother; Kidney disease in her mother.  ROS as above:  Medications: Current Outpatient Medications  Medication Sig Dispense Refill  . ALPRAZolam (XANAX) 0.5 MG tablet Take 1 tablet (0.5 mg total) by mouth 2 (two) times  daily as needed for anxiety. 30 tablet 0  . cycloSPORINE (RESTASIS MULTIDOSE) 0.05 % ophthalmic emulsion Place 1 drop into both eyes 2 (two) times daily. (Patient taking differently: Place 1 drop into both eyes 3 times/day as needed-between meals & bedtime. ) 0.4 mL 2  . desogestrel-ethinyl estradiol (APRI,EMOQUETTE,SOLIA) 0.15-30 MG-MCG tablet Take 1 tablet by mouth daily. 3 Package 4  . ergocalciferol (VITAMIN D2) 50000 units capsule Take 1 capsule (50,000 Units total) by mouth once a week. 12 capsule 0  . levocetirizine (XYZAL) 5 MG tablet Take 1 tablet (5 mg total) by mouth every evening. 90 tablet 1  . Liraglutide -Weight Management (SAXENDA) 18 MG/3ML SOPN Inject 3 mg into the skin daily. Please include ultra fine needles 76mm 15 pen 0  . nebivolol (BYSTOLIC) 2.5 MG tablet Take 1 tablet (2.5 mg total) by mouth daily. 56 tablet 0  . SUMAtriptan (IMITREX) 100 MG tablet Take 1 tablet (100 mg total) by mouth once as needed for migraine. May repeat in 2 hours if headache persists or recurs. 10 tablet 1  . valACYclovir (VALTREX) 1000 MG tablet Take 1 tablet (1,000 mg total) by mouth 2 (two) times daily. (Patient taking differently: Take 1,000 mg by mouth as needed. ) 14 tablet 11  . vortioxetine HBr (TRINTELLIX) 5 MG TABS tablet Take 1 tablet (5 mg total) by mouth daily. 30 tablet 1  No current facility-administered medications for this visit.    Allergies  Allergen Reactions  . Influenza Vaccines Hives  . Wellbutrin [Bupropion]     Possible nipple soreness/headaches/not feeling right    Health Maintenance Health Maintenance  Topic Date Due  . PAP SMEAR  09/16/2020  . TETANUS/TDAP  04/29/2024  . HIV Screening  Completed  . INFLUENZA VACCINE  Discontinued     Exam:  BP 117/77   Pulse 98   Temp 97.7 F (36.5 C) (Oral)   Wt 172 lb (78 kg)   SpO2 100%   BMI 32.50 kg/m  Gen: Well NAD HEENT: EOMI,  MMM, PERRL Lungs: Normal work of breathing. CTABL Heart: RRR no MRG Abd: NABS,  Soft. Nondistended, Nontender Exts: Brisk capillary refill, warm and well perfused.  Neuro: Alert and oriented normal coordination and gait and balance.   No results found for this or any previous visit (from the past 72 hour(s)). No results found.    Assessment and Plan: 42 y.o. female with migraine headache.  Patient was given Toradol and dexamethasone injections listed below.  Recheck if not improved.  Recommend patient keep track of her migraines.  If she is having more than 2 migraines per month is reasonable to consider prophylaxis with her primary care provider.   No orders of the defined types were placed in this encounter.  Meds ordered this encounter  Medications  . ketorolac (TORADOL) injection 60 mg  . DISCONTD: dexamethasone (DECADRON) injection 10 mg  . DISCONTD: ketorolac (TORADOL) injection 60 mg  . dexamethasone (DECADRON) injection 10 mg     Discussed warning signs or symptoms. Please see discharge instructions. Patient expresses understanding.

## 2018-03-02 ENCOUNTER — Ambulatory Visit
Admission: RE | Admit: 2018-03-02 | Discharge: 2018-03-02 | Disposition: A | Discharge status: Home / Self Care | Payer: 59 | Source: Ambulatory Visit | Attending: Physician Assistant | Admitting: Physician Assistant

## 2018-03-02 DIAGNOSIS — Z139 Encounter for screening, unspecified: Secondary | ICD-10-CM

## 2018-03-02 DIAGNOSIS — Z1231 Encounter for screening mammogram for malignant neoplasm of breast: Secondary | ICD-10-CM | POA: Diagnosis not present

## 2018-03-03 NOTE — Progress Notes (Signed)
Mammogram normal follow up in 1 year.

## 2018-03-06 ENCOUNTER — Ambulatory Visit: Payer: 59 | Admitting: Psychology

## 2018-03-10 ENCOUNTER — Other Ambulatory Visit: Payer: Self-pay | Admitting: Physician Assistant

## 2018-03-10 DIAGNOSIS — R454 Irritability and anger: Secondary | ICD-10-CM

## 2018-03-10 DIAGNOSIS — F330 Major depressive disorder, recurrent, mild: Secondary | ICD-10-CM

## 2018-03-10 NOTE — Progress Notes (Signed)
Pt needs referral for counseling.

## 2018-03-12 MED FILL — LEVOCETIRIZINE 5 MG TABLET: 5 | 90 days supply | Qty: 90 | Fill #0

## 2018-03-16 MED FILL — JULEBER 0.15-30 MG-MCG TABS: 0.15-30 | 84 days supply | Qty: 84 | Fill #2

## 2018-03-17 ENCOUNTER — Ambulatory Visit (INDEPENDENT_AMBULATORY_CARE_PROVIDER_SITE_OTHER): Payer: 59 | Admitting: Physician Assistant

## 2018-03-17 VITALS — BP 107/72 | HR 98 | Resp 16 | Wt 176.0 lb

## 2018-03-17 DIAGNOSIS — D510 Vitamin B12 deficiency anemia due to intrinsic factor deficiency: Secondary | ICD-10-CM

## 2018-03-17 MED ORDER — CYANOCOBALAMIN 1000 MCG/ML IJ SOLN
1000.0000 ug | Freq: Once | INTRAMUSCULAR | Status: AC
  Administered 2018-03-17: 1000 ug via INTRAMUSCULAR

## 2018-03-17 NOTE — Progress Notes (Signed)
   Subjective:    Patient ID: Kelsey Vaughan, female    DOB: 04-24-1976, 42 y.o.   MRN: [id number]  HPI  Kelsey Vaughan is here for a vitamin B 12 injection. Denies muscle cramps, weakness or irregular heart rate.   Review of Systems     Objective:   Physical Exam        Assessment & Plan:  Patient tolerated injection well without complications. Patient advised to schedule next injection 30 days from today.   Agree with above plan. Iran Planas PA-C

## 2018-03-25 ENCOUNTER — Encounter: Payer: Self-pay | Admitting: Physician Assistant

## 2018-03-25 ENCOUNTER — Ambulatory Visit (INDEPENDENT_AMBULATORY_CARE_PROVIDER_SITE_OTHER): Payer: 59 | Admitting: Physician Assistant

## 2018-03-25 VITALS — BP 115/68 | HR 76 | Ht 61.0 in | Wt 177.0 lb

## 2018-03-25 DIAGNOSIS — R454 Irritability and anger: Secondary | ICD-10-CM | POA: Diagnosis not present

## 2018-03-25 DIAGNOSIS — F330 Major depressive disorder, recurrent, mild: Secondary | ICD-10-CM
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
## 2018-03-26 ENCOUNTER — Ambulatory Visit (INDEPENDENT_AMBULATORY_CARE_PROVIDER_SITE_OTHER): Payer: 59 | Admitting: Psychiatry

## 2018-03-26 ENCOUNTER — Encounter (HOSPITAL_COMMUNITY): Payer: Self-pay | Admitting: Psychiatry

## 2018-03-26 VITALS — BP 122/74 | HR 100 | Ht 62.0 in | Wt 175.0 lb

## 2018-03-26 DIAGNOSIS — Z915 Personal history of self-harm: Secondary | ICD-10-CM | POA: Diagnosis not present

## 2018-03-26 DIAGNOSIS — F17210 Nicotine dependence, cigarettes, uncomplicated: Secondary | ICD-10-CM

## 2018-03-26 DIAGNOSIS — F331 Major depressive disorder, recurrent, moderate: Secondary | ICD-10-CM | POA: Diagnosis not present

## 2018-03-26 MED ORDER — LAMOTRIGINE 25 MG PO TABS: ORAL_TABLET | ORAL | 0 refills | Status: DC
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
## 2018-04-07 DIAGNOSIS — R3129 Other microscopic hematuria: Secondary | ICD-10-CM | POA: Diagnosis not present

## 2018-04-07 DIAGNOSIS — Z8679 Personal history of other diseases of the circulatory system: Secondary | ICD-10-CM | POA: Diagnosis not present

## 2018-04-08 ENCOUNTER — Encounter (HOSPITAL_COMMUNITY): Payer: Self-pay | Admitting: Licensed Clinical Social Worker

## 2018-04-08 ENCOUNTER — Telehealth (HOSPITAL_COMMUNITY): Payer: Self-pay

## 2018-04-08 ENCOUNTER — Ambulatory Visit (INDEPENDENT_AMBULATORY_CARE_PROVIDER_SITE_OTHER): Payer: 59 | Admitting: Licensed Clinical Social Worker

## 2018-04-08 DIAGNOSIS — F331 Major depressive disorder, recurrent, moderate: Secondary | ICD-10-CM

## 2018-04-08 NOTE — Telephone Encounter (Signed)
Patient is here to see Eustaquio Maize, she states that she is up to the 100 mg of Lamictal and is not feeling any better. Patient reports she is still depressed and feels no different being on the medication. Please review and advise, thank you
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
## 2018-04-09 ENCOUNTER — Other Ambulatory Visit (HOSPITAL_COMMUNITY): Payer: Self-pay | Admitting: Psychiatry

## 2018-04-09 MED ORDER — ARIPIPRAZOLE 5 MG PO TABS: ORAL_TABLET | ORAL | 2 refills | Status: DC

## 2018-04-09 MED FILL — ARIPiprazole 5 MG TABS: 5 | 30 days supply | Qty: 30 | Fill #0

## 2018-04-09 NOTE — Telephone Encounter (Signed)
Called patient and let her know, you did not say if she should stop the Lamictal or continue it with the Abilify. Please clarify, thank you

## 2018-04-09 NOTE — Telephone Encounter (Signed)
She should continue Lamictal for now and we will consider stopping the dose on her next appointment.

## 2018-04-09 NOTE — Telephone Encounter (Signed)
She could try Abilify 5 mg.  She will start half tablet for a few days and then full tablet daily.

## 2018-04-09 NOTE — Telephone Encounter (Signed)
I called patient back and let her know to continue the Lamictal with the Abilify. Patient voiced her understanding and will call with any problems

## 2018-04-10 ENCOUNTER — Telehealth: Payer: Self-pay | Admitting: Physician Assistant

## 2018-04-10 DIAGNOSIS — J019 Acute sinusitis, unspecified: Secondary | ICD-10-CM

## 2018-04-10 MED ORDER — CEFUROXIME AXETIL 250 MG PO TABS: 250.0000 mg | ORAL_TABLET | Freq: Two times a day (BID) | ORAL | 0 refills | Status: DC

## 2018-04-10 MED FILL — CEFUROXIME AXETIL 250 MG TA: 250 | 10 days supply | Qty: 20 | Fill #0

## 2018-04-10 NOTE — Telephone Encounter (Signed)
Pt advised.

## 2018-04-10 NOTE — Telephone Encounter (Signed)
Pt called clinic stating she has had sinus congestion for the past week. Complains of nasal congestion, runny nose, facial pain, and sinus drainage. Requesting an Rx to help. Pt would like this went to [facility]. Will route.

## 2018-04-10 NOTE — Telephone Encounter (Signed)
Prescription for Ceftin twice a day for 10 days for bacterial sinusitis Warm facial compresses Nasal saline rinses followed by Flonase 1 spray each nare Avoid oral decongestants due to heart arrythmia

## 2018-04-17 ENCOUNTER — Encounter: Payer: Self-pay | Admitting: Physician Assistant

## 2018-04-17 ENCOUNTER — Ambulatory Visit (INDEPENDENT_AMBULATORY_CARE_PROVIDER_SITE_OTHER): Payer: 59 | Admitting: Physician Assistant

## 2018-04-17 VITALS — BP 110/75 | HR 81 | Ht 62.0 in | Wt 175.0 lb

## 2018-04-17 DIAGNOSIS — E6609 Other obesity due to excess calories: Secondary | ICD-10-CM | POA: Diagnosis not present

## 2018-04-17 DIAGNOSIS — Z6832 Body mass index (BMI) 32.0-32.9, adult: Secondary | ICD-10-CM | POA: Diagnosis not present

## 2018-04-17 MED ORDER — PHENTERMINE HCL 37.5 MG PO CAPS: 37.5000 mg | ORAL_CAPSULE | ORAL | 0 refills | Status: DC

## 2018-04-17 MED FILL — PHENTERMINE 37.5 MG CAPSULE: 37.5 | 30 days supply | Qty: 30 | Fill #0

## 2018-04-19 ENCOUNTER — Encounter: Payer: Self-pay | Admitting: Physician Assistant
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
## 2018-04-21 ENCOUNTER — Ambulatory Visit (INDEPENDENT_AMBULATORY_CARE_PROVIDER_SITE_OTHER): Payer: 59 | Admitting: Psychology

## 2018-04-21 DIAGNOSIS — F4323 Adjustment disorder with mixed anxiety and depressed mood: Secondary | ICD-10-CM | POA: Diagnosis not present

## 2018-04-22 ENCOUNTER — Other Ambulatory Visit: Payer: Self-pay | Admitting: Physician Assistant

## 2018-04-22 DIAGNOSIS — Z113 Encounter for screening for infections with a predominantly sexual mode of transmission: Secondary | ICD-10-CM

## 2018-04-22 NOTE — Progress Notes (Signed)
Pt request STD screening.

## 2018-04-23 DIAGNOSIS — Z113 Encounter for screening for infections with a predominantly sexual mode of transmission: Secondary | ICD-10-CM | POA: Diagnosis not present

## 2018-04-24 LAB — SPECIMEN STATUS REPORT

## 2018-04-24 LAB — HIV ANTIBODY (ROUTINE TESTING W REFLEX): HIV Screen 4th Generation wRfx: NONREACTIVE

## 2018-04-24 LAB — RPR: RPR Ser Ql: NONREACTIVE

## 2018-04-24 NOTE — Progress Notes (Signed)
Negative STD screening

## 2018-04-27 ENCOUNTER — Ambulatory Visit (INDEPENDENT_AMBULATORY_CARE_PROVIDER_SITE_OTHER): Payer: 59 | Admitting: Family Medicine

## 2018-04-27 ENCOUNTER — Encounter: Payer: Self-pay | Admitting: Family Medicine

## 2018-04-27 VITALS — BP 105/72 | HR 89 | Temp 98.1°F | Ht 62.0 in | Wt 171.0 lb

## 2018-04-27 DIAGNOSIS — M62838 Other muscle spasm: Secondary | ICD-10-CM

## 2018-04-27 MED ORDER — MECLIZINE HCL 25 MG PO TABS: 25.0000 mg | ORAL_TABLET | Freq: Three times a day (TID) | ORAL | 3 refills | Status: DC | PRN

## 2018-04-27 NOTE — Patient Instructions (Signed)
Thank you for coming in today. Take meclezine up to 3x daily for dizziness.  It may make you sleepy.  Use a heating pad and tylenol for neck pain and spasm.  Use a TENS unit as needed. If not better next step is PT.  Let me know.  TENS UNIT: This is helpful for muscle pain and spasm.   Search and Purchase a TENS 7000 2nd edition at  www.tenspros.com or www.[employer].com It should be less than $30.     TENS unit instructions: Do not shower or bathe with the unit on Turn the unit off before removing electrodes or batteries If the electrodes lose stickiness add a drop of water to the electrodes after they are disconnected from the unit and place on plastic sheet. If you continued to have difficulty, call the TENS unit company to purchase more electrodes. Do not apply lotion on the skin area prior to use. Make sure the skin is clean and dry as this will help prolong the life of the electrodes. After use, always check skin for unusual red areas, rash or other skin difficulties. If there are any skin problems, does not apply electrodes to the same area. Never remove the electrodes from the unit by pulling the wires. Do not use the TENS unit or electrodes other than as directed. Do not change electrode placement without consultating your therapist or physician. Keep 2 fingers with between each electrode. Wear time ratio is 2:1, on to off times.    For example on for 30 minutes off for 15 minutes and then on for 30 minutes off for 15 minutes      Cervical Strain and Sprain Rehab Ask your health care provider which exercises are safe for you. Do exercises exactly as told by your health care provider and adjust them as directed. It is normal to feel mild stretching, pulling, tightness, or discomfort as you do these exercises, but you should stop right away if you feel sudden pain or your pain gets worse.Do not begin these exercises until told by your health care provider. Stretching and range  of motion exercises These exercises warm up your muscles and joints and improve the movement and flexibility of your neck. These exercises also help to relieve pain, numbness, and tingling. Exercise A: Cervical side bend  1. Using good posture, sit on a stable chair or stand up. 2. Without moving your shoulders, slowly tilt your left / right ear to your shoulder until you feel a stretch in your neck muscles. You should be looking straight ahead. 3. Hold for __________ seconds. 4. Repeat with the other side of your neck. Repeat __________ times. Complete this exercise __________ times a day. Exercise B: Cervical rotation  1. Using good posture, sit on a stable chair or stand up. 2. Slowly turn your head to the side as if you are looking over your left / right shoulder. ? Keep your eyes level with the ground. ? Stop when you feel a stretch along the side and the back of your neck. 3. Hold for __________ seconds. 4. Repeat this by turning to your other side. Repeat __________ times. Complete this exercise __________ times a day. Exercise C: Thoracic extension and pectoral stretch 1. Roll a towel or a small blanket so it is about 4 inches (10 cm) in diameter. 2. Lie down on your back on a firm surface. 3. Put the towel lengthwise, under your spine in the middle of your back. It should not be not  under your shoulder blades. The towel should line up with your spine from your middle back to your lower back. 4. Put your hands behind your head and let your elbows fall out to your sides. 5. Hold for __________ seconds. Repeat __________ times. Complete this exercise __________ times a day. Strengthening exercises These exercises build strength and endurance in your neck. Endurance is the ability to use your muscles for a long time, even after your muscles get tired. Exercise D: Upper cervical flexion, isometric 1. Lie on your back with a thin pillow behind your head and a small rolled-up towel  under your neck. 2. Gently tuck your chin toward your chest and nod your head down to look toward your feet. Do not lift your head off the pillow. 3. Hold for __________ seconds. 4. Release the tension slowly. Relax your neck muscles completely before you repeat this exercise. Repeat __________ times. Complete this exercise __________ times a day. Exercise E: Cervical extension, isometric  1. Stand about 6 inches (15 cm) away from a wall, with your back facing the wall. 2. Place a soft object, about 6-8 inches (15-20 cm) in diameter, between the back of your head and the wall. A soft object could be a small pillow, a ball, or a folded towel. 3. Gently tilt your head back and press into the soft object. Keep your jaw and forehead relaxed. 4. Hold for __________ seconds. 5. Release the tension slowly. Relax your neck muscles completely before you repeat this exercise. Repeat __________ times. Complete this exercise __________ times a day. Posture and body mechanics  Body mechanics refers to the movements and positions of your body while you do your daily activities. Posture is part of body mechanics. Good posture and healthy body mechanics can help to relieve stress in your body's tissues and joints. Good posture means that your spine is in its natural S-curve position (your spine is neutral), your shoulders are pulled back slightly, and your head is not tipped forward. The following are general guidelines for applying improved posture and body mechanics to your everyday activities. Standing  When standing, keep your spine neutral and keep your feet about hip-width apart. Keep a slight bend in your knees. Your ears, shoulders, and hips should line up.  When you do a task in which you stand in one place for a long time, place one foot up on a stable object that is 2-4 inches (5-10 cm) high, such as a footstool. This helps keep your spine neutral. Sitting   When sitting, keep your spine neutral  and your keep feet flat on the floor. Use a footrest, if necessary, and keep your thighs parallel to the floor. Avoid rounding your shoulders, and avoid tilting your head forward.  When working at a desk or a computer, keep your desk at a height where your hands are slightly lower than your elbows. Slide your chair under your desk so you are close enough to maintain good posture.  When working at a computer, place your monitor at a height where you are looking straight ahead and you do not have to tilt your head forward or downward to look at the screen. Resting When lying down and resting, avoid positions that are most painful for you. Try to support your neck in a neutral position. You can use a contour pillow or a small rolled-up towel. Your pillow should support your neck but not push on it. This information is not intended to replace advice given  to you by your health care provider. Make sure you discuss any questions you have with your health care provider. Document Released: 12/02/2005 Document Revised: 08/08/2016 Document Reviewed: 11/08/2015 Elsevier Interactive Patient Education  2018 [employer].   Benign Positional Vertigo Vertigo is the feeling that you or your surroundings are moving when they are not. Benign positional vertigo is the most common form of vertigo. The cause of this condition is not serious (is benign). This condition is triggered by certain movements and positions (is positional). This condition can be dangerous if it occurs while you are doing something that could endanger you or others, such as driving. What are the causes? In many cases, the cause of this condition is not known. It may be caused by a disturbance in an area of the inner ear that helps your brain to sense movement and balance. This disturbance can be caused by a viral infection (labyrinthitis), head injury, or repetitive motion. What increases the risk? This condition is more likely to develop  in:  Women.  People who are 45 years of age or older.  What are the signs or symptoms? Symptoms of this condition usually happen when you move your head or your eyes in different directions. Symptoms may start suddenly, and they usually last for less than a minute. Symptoms may include:  Loss of balance and falling.  Feeling like you are spinning or moving.  Feeling like your surroundings are spinning or moving.  Nausea and vomiting.  Blurred vision.  Dizziness.  Involuntary eye movement (nystagmus).  Symptoms can be mild and cause only slight annoyance, or they can be severe and interfere with daily life. Episodes of benign positional vertigo may return (recur) over time, and they may be triggered by certain movements. Symptoms may improve over time. How is this diagnosed? This condition is usually diagnosed by medical history and a physical exam of the head, neck, and ears. You may be referred to a health care provider who specializes in ear, nose, and throat (ENT) problems (otolaryngologist) or a provider who specializes in disorders of the nervous system (neurologist). You may have additional testing, including:  MRI.  A CT scan.  Eye movement tests. Your health care provider may ask you to change positions quickly while he or she watches you for symptoms of benign positional vertigo, such as nystagmus. Eye movement may be tested with an electronystagmogram (ENG), caloric stimulation, the Dix-Hallpike test, or the roll test.  An electroencephalogram (EEG). This records electrical activity in your brain.  Hearing tests.  How is this treated? Usually, your health care provider will treat this by moving your head in specific positions to adjust your inner ear back to normal. Surgery may be needed in severe cases, but this is rare. In some cases, benign positional vertigo may resolve on its own in 2-4 weeks. Follow these instructions at home: Safety  Move slowly.Avoid sudden  body or head movements.  Avoid driving.  Avoid operating heavy machinery.  Avoid doing any tasks that would be dangerous to you or others if a vertigo episode would occur.  If you have trouble walking or keeping your balance, try using a cane for stability. If you feel dizzy or unstable, sit down right away.  Return to your normal activities as told by your health care provider. Ask your health care provider what activities are safe for you. General instructions  Take over-the-counter and prescription medicines only as told by your health care provider.  Avoid certain positions  or movements as told by your health care provider.  Drink enough fluid to keep your urine clear or pale yellow.  Keep all follow-up visits as told by your health care provider. This is important. Contact a health care provider if:  You have a fever.  Your condition gets worse or you develop new symptoms.  Your family or friends notice any behavioral changes.  Your nausea or vomiting gets worse.  You have numbness or a "pins and needles" sensation. Get help right away if:  You have difficulty speaking or moving.  You are always dizzy.  You faint.  You develop severe headaches.  You have weakness in your legs or arms.  You have changes in your hearing or vision.  You develop a stiff neck.  You develop sensitivity to light. This information is not intended to replace advice given to you by your health care provider. Make sure you discuss any questions you have with your health care provider. Document Released: 09/09/2006 Document Revised: 05/09/2016 Document Reviewed: 03/27/2015 Elsevier Interactive Patient Education  [employer].

## 2018-04-27 NOTE — Progress Notes (Signed)
Kelsey Vaughan is a 42 y.o. female who presents to [facility] today for neck pain.  Kelsey Vaughan notes onset of neck pain shortly after she received a neck massage a few days ago.  She notes pain at the left posterior neck associate with a headache and mild dizziness.  She denies any radiating pain weakness or numbness.  She denies any frank vertigo nausea or vomiting.  She is tried some over-the-counter medicines for pain which is only helped a little.  She feels well otherwise with no fevers or chills.  Past Medical History:  Diagnosis Date  . Anxiety   . Atrial fibrillation ([place])    a. occuring in 02/2016  . B12 deficiency   . Constipation   . Foreign body in foot, left 07/2013  . GERD (gastroesophageal reflux disease)   . Hematuria   . Irregular menses    due to oral contraceptive  . Palpitations    a. 01/2017: Event monitor showing SR with PVC's.   . Sinus tachycardia   . Thyroid nodule   . Vitamin D deficiency    Past Surgical History:  Procedure Laterality Date  . CESAREAN SECTION    . FOREIGN BODY REMOVAL Left 08/05/2013   Procedure: LEFT FOOT FOREIGN BODY REMOVAL ADULT;  Surgeon: Wylene Simmer, MD;  Location: [facility];  Service: Orthopedics;  Laterality: Left;  . SHOULDER ARTHROSCOPY Left   . TUBAL LIGATION     Social History   Tobacco Use  . Smoking status: Current Every Day Smoker    Years: 6.00    Types: Cigarettes  . Smokeless tobacco: Never Used  . Tobacco comment: 4-5 cigs/day  Substance Use Topics  . Alcohol use: Yes    Comment: occasionally     ROS:  As above   Medications: Current Outpatient Medications  Medication Sig Dispense Refill  . ALPRAZolam (XANAX) 0.5 MG tablet Take 1 tablet (0.5 mg total) by mouth 2 (two) times daily as needed for anxiety. 30 tablet 0  . ARIPiprazole (ABILIFY) 5 MG tablet Take 1/2 tablet (2.5 mg) for 1 week, then increase to 5 mg daily 30 tablet 2  . cycloSPORINE  (RESTASIS MULTIDOSE) 0.05 % ophthalmic emulsion Place 1 drop into both eyes 2 (two) times daily. (Patient taking differently: Place 1 drop into both eyes 3 times/day as needed-between meals & bedtime. ) 0.4 mL 2  . desogestrel-ethinyl estradiol (APRI,EMOQUETTE,SOLIA) 0.15-30 MG-MCG tablet Take 1 tablet by mouth daily. 3 Package 4  . lamoTRIgine (LAMICTAL) 25 MG tablet Take 2 tab daily for 1 week and than 3 tab daily for 1 week and than 4 tab daily 120 tablet 0  . levocetirizine (XYZAL) 5 MG tablet Take 1 tablet (5 mg total) by mouth every evening. 90 tablet 1  . phentermine 37.5 MG capsule Take 1 capsule (37.5 mg total) by mouth every morning. 30 capsule 0  . SUMAtriptan (IMITREX) 100 MG tablet Take 1 tablet (100 mg total) by mouth once as needed for migraine. May repeat in 2 hours if headache persists or recurs. 10 tablet 1  . valACYclovir (VALTREX) 1000 MG tablet Take 1 tablet (1,000 mg total) by mouth 2 (two) times daily. (Patient taking differently: Take 1,000 mg by mouth as needed. ) 14 tablet 11  . meclizine (ANTIVERT) 25 MG tablet Take 1 tablet (25 mg total) by mouth 3 (three) times daily as needed for dizziness or nausea. 30 tablet 3   No current facility-administered medications for this  visit.    Allergies  Allergen Reactions  . Influenza Vaccines Hives and Rash  . Wellbutrin [Bupropion]     Possible nipple soreness/headaches/not feeling right     Exam:  BP 105/72 (BP Location: Left Arm, Patient Position: Sitting, Cuff Size: Normal)   Pulse 89   Temp 98.1 F (36.7 C) (Oral)   Ht 5\' 2"  (1.575 m)   Wt 171 lb (77.6 kg)   BMI 31.28 kg/m  General: Well Developed, well nourished, and in no acute distress.  Neuro/Psych: Alert and oriented x3, extra-ocular muscles intact, able to move all 4 extremities, sensation grossly intact. Skin: Warm and dry, no rashes noted.  Respiratory: Not using accessory muscles, speaking in full sentences, trachea midline.  Cardiovascular: Pulses  palpable, no extremity edema. Abdomen: Does not appear distended. MSK:  C-spine: Nontender to spinal midline. Tender to palpation left trapezius Neck range of motion is normal with exception of extension which is mildly limited Upper extremity strength sensation reflexes are equal normal bilaterally Neuro: Alert and oriented normal coordination balance and gait. Dix-Hallpike test bilaterally     Assessment and Plan: 42 y.o. female with  Cervical paraspinal muscle and trapezius spasm and strain. Plan for relative rest home exercise program, oral NSAIDs/Tylenol, muscle relaxers, and meclizine. Possible BPPV type vertigo.  Doubtful for serious etiology.  If symptoms worsen next step would be physical therapy.  Return as needed.    No orders of the defined types were placed in this encounter.  Meds ordered this encounter  Medications  . meclizine (ANTIVERT) 25 MG tablet    Sig: Take 1 tablet (25 mg total) by mouth 3 (three) times daily as needed for dizziness or nausea.    Dispense:  30 tablet    Refill:  3    Discussed warning signs or symptoms. Please see discharge instructions. Patient expresses understanding.

## 2018-04-29 ENCOUNTER — Other Ambulatory Visit: Payer: Self-pay | Admitting: Physician Assistant

## 2018-04-29 DIAGNOSIS — M542 Cervicalgia: Secondary | ICD-10-CM

## 2018-04-29 DIAGNOSIS — M62838 Other muscle spasm: Secondary | ICD-10-CM

## 2018-04-29 DIAGNOSIS — M549 Dorsalgia, unspecified: Secondary | ICD-10-CM

## 2018-04-29 MED ORDER — AMBULATORY NON FORMULARY MEDICATION: 0 refills | Status: DC

## 2018-04-29 NOTE — Progress Notes (Signed)
Pt had a massage earlier this week and causes worsening neck pain, mid back pain and muscle spasms. She request chiropractor.

## 2018-05-03 ENCOUNTER — Emergency Department (HOSPITAL_COMMUNITY)
Admission: EM | Admit: 2018-05-03 | Discharge: 2018-05-03 | Discharge status: Against Medical Advice | Payer: 59 | Attending: Emergency Medicine | Admitting: Emergency Medicine

## 2018-05-03 ENCOUNTER — Encounter (HOSPITAL_COMMUNITY): Payer: Self-pay

## 2018-05-03 DIAGNOSIS — M542 Cervicalgia: Secondary | ICD-10-CM | POA: Insufficient documentation

## 2018-05-03 DIAGNOSIS — Z7982 Long term (current) use of aspirin: Secondary | ICD-10-CM | POA: Diagnosis not present

## 2018-05-03 DIAGNOSIS — Z79899 Other long term (current) drug therapy: Secondary | ICD-10-CM | POA: Insufficient documentation

## 2018-05-03 DIAGNOSIS — Z5321 Procedure and treatment not carried out due to patient leaving prior to being seen by health care provider: Secondary | ICD-10-CM | POA: Diagnosis not present

## 2018-05-03 DIAGNOSIS — Z888 Allergy status to other drugs, medicaments and biological substances status: Secondary | ICD-10-CM | POA: Diagnosis not present

## 2018-05-03 DIAGNOSIS — R42 Dizziness and giddiness: Secondary | ICD-10-CM | POA: Insufficient documentation

## 2018-05-03 DIAGNOSIS — R55 Syncope and collapse: Secondary | ICD-10-CM | POA: Diagnosis not present

## 2018-05-03 DIAGNOSIS — R51 Headache: Secondary | ICD-10-CM | POA: Insufficient documentation

## 2018-05-03 DIAGNOSIS — H9202 Otalgia, left ear: Secondary | ICD-10-CM | POA: Diagnosis not present

## 2018-05-03 DIAGNOSIS — F17210 Nicotine dependence, cigarettes, uncomplicated: Secondary | ICD-10-CM | POA: Insufficient documentation

## 2018-05-03 DIAGNOSIS — S161XXA Strain of muscle, fascia and tendon at neck level, initial encounter: Secondary | ICD-10-CM | POA: Diagnosis not present

## 2018-05-03 DIAGNOSIS — Z887 Allergy status to serum and vaccine status: Secondary | ICD-10-CM | POA: Diagnosis not present

## 2018-05-03 MED ORDER — HYDROMORPHONE HCL 2 MG/ML IJ SOLN: 0.5000 mg | Freq: Once | INTRAMUSCULAR | Status: DC

## 2018-05-03 MED ORDER — ONDANSETRON HCL 4 MG/2ML IJ SOLN: 4.0000 mg | Freq: Once | INTRAMUSCULAR | Status: DC

## 2018-05-03 MED ORDER — SODIUM CHLORIDE 0.9 % IV SOLN: INTRAVENOUS | Status: DC

## 2018-05-03 NOTE — ED Notes (Signed)
Pt stormed out her room and stated she was leaving. Pt did not give this RN a moment to speak with her about a provider coming in to see her and took off down the hall.

## 2018-05-03 NOTE — ED Provider Notes (Signed)
[facility] DEPART[facility] Provider Note   CSN: [id number] Arrival date & time: 05/03/18  1547     History   Chief Complaint Chief Complaint  Patient presents with  . Dizziness    HPI Kelsey Vaughan is a 42 y.o. female.  Patient presenting by POV.  Patient with a complaint of dizziness made worse by moving her head since Saturday a week ago.  Associated with a global type headache and some posterior neck pain neck pain is not localized to the left or right side.  Patient denies any true room spinning.  Patient seen by primary care provider was started on meclizine which is not helping.  Patient relates that patient got a massage on February 11 and was massaging her neck and felt something pop in her neck.  Since that time she has been having the symptoms.  Patient denies any numbness or weakness or tingling.  Patient with complaint of some left ear pain.     Past Medical History:  Diagnosis Date  . Anxiety   . Atrial fibrillation ([place])    a. occuring in 02/2016  . B12 deficiency   . Constipation   . Foreign body in foot, left 07/2013  . GERD (gastroesophageal reflux disease)   . Hematuria   . Irregular menses    due to oral contraceptive  . Palpitations    a. 01/2017: Event monitor showing SR with PVC's.   . Sinus tachycardia   . Thyroid nodule   . Vitamin D deficiency     Patient Active Problem List   Diagnosis Date Noted  . Irritability and anger 02/23/2018  . Mood changes 12/24/2017  . Irritable 12/24/2017  . Vaginal discharge 11/04/2017  . Vaginal irritation 11/04/2017  . Lower abdominal pain 11/04/2017  . Severe anxiety 10/14/2017  . Stress reaction 10/12/2017  . Frontal headache 09/16/2017  . MDD (major depressive disorder), recurrent episode, mild ([place]) 09/03/2017  . Insulin resistance 07/30/2017  . Epigastric abdominal pain 04/17/2017  . Constipation 04/17/2017  . Hematuria 04/17/2017  . Allergic sinusitis 04/07/2017  .  Leiomyoma of uterus 02/26/2017  . Renal cyst, right 02/26/2017  . Sinus tachycardia 02/06/2017  . Palpitations 01/29/2017  . PAC (premature atrial contraction) 01/29/2017  . Vitamin D deficiency 12/23/2016  . B12 deficiency 12/23/2016  . Dysmenorrhea 12/19/2016  . Bilateral cold feet 12/19/2016  . GAD (generalized anxiety disorder) 12/19/2016  . Tobacco dependence 12/19/2016  . Tachycardia, paroxysmal ([place]) 03/07/2016  . Microscopic hematuria 02/29/2016  . Allergy to influenza vaccine 10/02/2015  . Class 1 obesity due to excess calories without serious comorbidity in adult 11/25/2014  . Neck strain 11/04/2014  . Sinus tarsi syndrome of left ankle 07/16/2013    Past Surgical History:  Procedure Laterality Date  . CESAREAN SECTION    . FOREIGN BODY REMOVAL Left 08/05/2013   Procedure: LEFT FOOT FOREIGN BODY REMOVAL ADULT;  Surgeon: Wylene Simmer, MD;  Location: [facility];  Service: Orthopedics;  Laterality: Left;  . SHOULDER ARTHROSCOPY Left   . TUBAL LIGATION       OB History    Gravida  3   Para  3   Term  3   Preterm      AB      Living  3     SAB      TAB      Ectopic      Multiple      Live Births  Home Medications    Prior to Admission medications   Medication Sig Start Date End Date Taking? Authorizing Provider  ALPRAZolam Duanne Moron) 0.5 MG tablet Take 1 tablet (0.5 mg total) by mouth 2 (two) times daily as needed for anxiety. 02/17/18  Yes Breeback, Jade L, PA-C  cycloSPORINE (RESTASIS MULTIDOSE) 0.05 % ophthalmic emulsion Place 1 drop into both eyes 2 (two) times daily. Patient taking differently: Place 1 drop into both eyes 3 times/day as needed-between meals & bedtime.  05/20/17  Yes Breeback, Jade L, PA-C  desogestrel-ethinyl estradiol (APRI,EMOQUETTE,SOLIA) 0.15-30 MG-MCG tablet Take 1 tablet by mouth daily. 08/08/17  Yes Breeback, Jade L, PA-C  levocetirizine (XYZAL) 5 MG tablet Take 1 tablet (5 mg total) by mouth every  evening. 02/25/18  Yes Breeback, Jade L, PA-C  nebivolol (BYSTOLIC) 2.5 MG tablet Take 2.5 mg by mouth daily as needed (palpatations).   Yes [provider]  phentermine 37.5 MG capsule Take 1 capsule (37.5 mg total) by mouth every morning. 04/17/18  Yes Breeback, Jade L, PA-C  SUMAtriptan (IMITREX) 100 MG tablet Take 1 tablet (100 mg total) by mouth once as needed for migraine. May repeat in 2 hours if headache persists or recurs. 06/27/17  Yes Trixie Dredge, PA-C  valACYclovir (VALTREX) 1000 MG tablet Take 1 tablet (1,000 mg total) by mouth 2 (two) times daily. Patient taking differently: Take 1,000 mg by mouth as needed.  12/24/16  Yes Trixie Dredge, [place] [facility] services for upper neck pain, mid back pain and muscle spasms. 04/29/18   Breeback, Jade L, PA-C  ARIPiprazole (ABILIFY) 5 MG tablet Take 1/2 tablet (2.5 mg) for 1 week, then increase to 5 mg daily Patient not taking: Reported on 05/03/2018 04/09/18   Arfeen, Arlyce Harman, MD  lamoTRIgine (LAMICTAL) 25 MG tablet Take 2 tab daily for 1 week and than 3 tab daily for 1 week and than 4 tab daily Patient not taking: Reported on 05/03/2018 03/26/18   Arfeen, Arlyce Harman, MD  meclizine (ANTIVERT) 25 MG tablet Take 1 tablet (25 mg total) by mouth 3 (three) times daily as needed for dizziness or nausea. Patient not taking: Reported on 05/03/2018 04/27/18   Gregor Hams, MD    Family History Family History  Problem Relation Age of Onset  . Diabetes Mother   . Hypertension Mother   . Kidney disease Mother   . CAD Father   . Breast cancer Neg Hx     Social History Social History   Tobacco Use  . Smoking status: Current Every Day Smoker    Years: 6.00    Types: Cigarettes  . Smokeless tobacco: Never Used  . Tobacco comment: 4-5 cigs/day  Substance Use Topics  . Alcohol use: Yes    Comment: occasionally  . Drug use: No     Allergies   Influenza vaccines and Wellbutrin  [bupropion]   Review of Systems Review of Systems  HENT: Positive for ear pain. Negative for congestion.   Respiratory: Negative for shortness of breath.   Genitourinary: Negative for dysuria.  Neurological: Positive for dizziness, syncope and headaches. Negative for speech difficulty, weakness and numbness.  Hematological: Does not bruise/bleed easily.  Psychiatric/Behavioral: Negative for confusion.     Physical Exam Updated Vital Signs BP 121/86 (BP Location: Right Arm)   Pulse 95   Temp 98.8 F (37.1 C) (Oral)   Resp 16   SpO2 100%   Physical Exam  Constitutional: She is oriented to person,  place, and time. She appears well-developed and well-nourished. No distress.  HENT:  Head: Normocephalic and atraumatic.  Right Ear: External ear normal.  Mouth/Throat: Oropharynx is clear and moist.  Left ear with some increased redness compared to right.  No bulging.  Ear canal normal.  Eyes: Pupils are equal, round, and reactive to light. Conjunctivae and EOM are normal.  Neck: Normal range of motion. Neck supple.  No tenderness to palpation to posterior neck good range of motion.  Cardiovascular: Normal rate, regular rhythm and normal heart sounds.  Pulmonary/Chest: Effort normal and breath sounds normal. No respiratory distress.  Abdominal: Soft. Bowel sounds are normal.  Musculoskeletal: Normal range of motion. She exhibits no edema.  Neurological: She is alert and oriented to person, place, and time. A cranial nerve deficit is present. No sensory deficit. She exhibits normal muscle tone. Coordination normal.  Nursing note and vitals reviewed.    ED Treatments / Results  Labs (all labs ordered are listed, but only abnormal results are displayed) Labs Reviewed  COMPREHENSIVE METABOLIC PANEL  CBC WITH DIFFERENTIAL/PLATELET    EKG None  Radiology No results found.  Procedures Procedures (including critical care time)  Medications Ordered in ED Medications  0.9 %   sodium chloride infusion (has no administration in time range)  ondansetron (ZOFRAN) injection 4 mg (has no administration in time range)  HYDROmorphone (DILAUDID) injection 0.5 mg (has no administration in time range)     Initial Impression / Assessment and Plan / ED Course  I have reviewed the triage vital signs and the nursing notes.  Pertinent labs & imaging results that were available during my care of the patient were reviewed by me and considered in my medical decision making (see chart for details).     Had ordered MRI brain and cervical spine based on patient's complaint of dizziness and the neck pain.  Basic labs ordered IV initiated.  Patient apparently left against advice.  I was not notified at the time.  But nurses did make an entry.  Appears the patient left around 1930.  Patient's seem to be fine with the plan was trying to meet her request to have additional work-up that her primary care doctor was not willing to do to try to figure out what was wrong with the dizziness and the neck pain.  Final Clinical Impressions(s) / ED Diagnoses   Final diagnoses:  Dizziness    ED Discharge Orders    None       Fredia Sorrow, MD 05/03/18 2017

## 2018-05-03 NOTE — ED Triage Notes (Signed)
Pt got a massage 04-25-18, pt was laying on back and person was massaging neck and pt felt something in her neck.  Since then she has been getting dizzy when turning head.  Since yesterday symptoms are occurring more frequent.  Hand grips equal. Denies numbness/tingling in extremities.

## 2018-05-03 NOTE — ED Triage Notes (Signed)
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

## 2018-05-04 ENCOUNTER — Ambulatory Visit (INDEPENDENT_AMBULATORY_CARE_PROVIDER_SITE_OTHER): Payer: 59 | Admitting: Physician Assistant

## 2018-05-04 ENCOUNTER — Ambulatory Visit (HOSPITAL_COMMUNITY): Payer: Self-pay | Admitting: Psychiatry

## 2018-05-04 VITALS — BP 117/61 | HR 98 | Temp 98.1°F | Wt 170.0 lb

## 2018-05-04 DIAGNOSIS — D510 Vitamin B12 deficiency anemia due to intrinsic factor deficiency: Secondary | ICD-10-CM | POA: Diagnosis not present

## 2018-05-04 DIAGNOSIS — E538 Deficiency of other specified B group vitamins: Secondary | ICD-10-CM

## 2018-05-04 DIAGNOSIS — M542 Cervicalgia: Secondary | ICD-10-CM

## 2018-05-04 MED ORDER — CYANOCOBALAMIN 1000 MCG/ML IJ SOLN
1000.0000 ug | Freq: Once | INTRAMUSCULAR | Status: AC
  Administered 2018-05-04: 1000 ug via INTRAMUSCULAR

## 2018-05-04 MED ORDER — KETOROLAC TROMETHAMINE 60 MG/2ML IM SOLN
60.0000 mg | Freq: Once | INTRAMUSCULAR | Status: AC
  Administered 2018-05-04: 60 mg via INTRAMUSCULAR

## 2018-05-04 NOTE — Progress Notes (Signed)
   Subjective:    Patient ID: Kelsey Vaughan, female    DOB: 31-Mar-1976, 42 y.o.   MRN: [id number]  HPI  [place] complains of neck pain for 1 week. She went to ED yesterday and was told she had neck muscle spasms. She was given prednisone but has not started it yet. No NSAIDs today.   Review of Systems     Objective:   Physical Exam        Assessment & Plan:  Neck pain - Per Iran Planas, PA-C, Ketorolac 60 mg IM. Start prednisone. Follow up as needed.   b12- shot given today. Recheck b12 level in 2 weeks.  Iran Planas PA-C

## 2018-05-08 ENCOUNTER — Encounter (HOSPITAL_COMMUNITY): Payer: Self-pay | Admitting: Psychiatry

## 2018-05-08 ENCOUNTER — Ambulatory Visit (INDEPENDENT_AMBULATORY_CARE_PROVIDER_SITE_OTHER): Payer: 59 | Admitting: Psychiatry

## 2018-05-08 VITALS — BP 119/73 | HR 102 | Ht 62.0 in | Wt 170.0 lb

## 2018-05-08 DIAGNOSIS — F331 Major depressive disorder, recurrent, moderate: Secondary | ICD-10-CM

## 2018-05-08 DIAGNOSIS — F17210 Nicotine dependence, cigarettes, uncomplicated: Secondary | ICD-10-CM
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
## 2018-05-13 ENCOUNTER — Other Ambulatory Visit: Payer: Self-pay | Admitting: Physician Assistant

## 2018-05-13 DIAGNOSIS — Z0000 Encounter for general adult medical examination without abnormal findings: Secondary | ICD-10-CM

## 2018-05-13 DIAGNOSIS — E559 Vitamin D deficiency, unspecified: Secondary | ICD-10-CM

## 2018-05-13 DIAGNOSIS — Z13220 Encounter for screening for lipoid disorders: Secondary | ICD-10-CM

## 2018-05-13 DIAGNOSIS — Z131 Encounter for screening for diabetes mellitus: Secondary | ICD-10-CM

## 2018-05-13 NOTE — Progress Notes (Signed)
Pt requested labs before CPE.

## 2018-05-20 ENCOUNTER — Ambulatory Visit (INDEPENDENT_AMBULATORY_CARE_PROVIDER_SITE_OTHER): Payer: 59 | Admitting: Physician Assistant

## 2018-05-20 ENCOUNTER — Encounter: Payer: Self-pay | Admitting: Physician Assistant

## 2018-05-20 VITALS — BP 121/69 | HR 96 | Temp 97.7°F | Ht 62.0 in | Wt 167.0 lb

## 2018-05-20 DIAGNOSIS — E6609 Other obesity due to excess calories: Secondary | ICD-10-CM

## 2018-05-20 DIAGNOSIS — Z6830 Body mass index (BMI) 30.0-30.9, adult: Secondary | ICD-10-CM

## 2018-05-20 DIAGNOSIS — F5081 Binge eating disorder, mild: Secondary | ICD-10-CM | POA: Insufficient documentation

## 2018-05-20 MED ORDER — LISDEXAMFETAMINE DIMESYLATE 30 MG PO CAPS
30.0000 mg | ORAL_CAPSULE | Freq: Every day | ORAL | 0 refills | Status: DC
Start: 2018-05-20 — End: 2018-06-26

## 2018-05-21 ENCOUNTER — Telehealth: Payer: Self-pay | Admitting: Physician Assistant

## 2018-05-21 ENCOUNTER — Encounter: Payer: Self-pay | Admitting: Physician Assistant

## 2018-05-21 DIAGNOSIS — Z13220 Encounter for screening for lipoid disorders: Secondary | ICD-10-CM

## 2018-05-21 DIAGNOSIS — Z0000 Encounter for general adult medical examination without abnormal findings: Secondary | ICD-10-CM

## 2018-05-21 DIAGNOSIS — Z131 Encounter for screening for diabetes mellitus: Secondary | ICD-10-CM

## 2018-05-21 DIAGNOSIS — E559 Vitamin D deficiency, unspecified: Secondary | ICD-10-CM

## 2018-05-21 NOTE — Telephone Encounter (Signed)
Received fax from [employer] that Vyvanse requires a PA. Information has been sent to the insurance company. Awaiting determination.   
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
## 2018-05-22 DIAGNOSIS — R7301 Impaired fasting glucose: Secondary | ICD-10-CM | POA: Diagnosis not present

## 2018-05-22 DIAGNOSIS — Z0000 Encounter for general adult medical examination without abnormal findings: Secondary | ICD-10-CM | POA: Diagnosis not present

## 2018-05-22 DIAGNOSIS — Z13220 Encounter for screening for lipoid disorders: Secondary | ICD-10-CM | POA: Diagnosis not present

## 2018-05-22 DIAGNOSIS — E559 Vitamin D deficiency, unspecified: Secondary | ICD-10-CM | POA: Diagnosis not present

## 2018-05-22 MED FILL — VYVANSE 30 MG CAPSULE: 30 | 30 days supply | Qty: 30 | Fill #0

## 2018-05-25 ENCOUNTER — Ambulatory Visit (INDEPENDENT_AMBULATORY_CARE_PROVIDER_SITE_OTHER): Payer: 59 | Admitting: Physician Assistant

## 2018-05-25 ENCOUNTER — Telehealth: Payer: Self-pay | Admitting: Physician Assistant

## 2018-05-25 ENCOUNTER — Encounter: Payer: Self-pay | Admitting: Physician Assistant

## 2018-05-25 VITALS — BP 109/65 | HR 61 | Temp 98.1°F | Wt 169.0 lb

## 2018-05-25 DIAGNOSIS — E6609 Other obesity due to excess calories: Secondary | ICD-10-CM | POA: Diagnosis not present

## 2018-05-25 DIAGNOSIS — I479 Paroxysmal tachycardia, unspecified: Secondary | ICD-10-CM | POA: Diagnosis not present

## 2018-05-25 DIAGNOSIS — I491 Atrial premature depolarization: Secondary | ICD-10-CM

## 2018-05-25 DIAGNOSIS — Z6830 Body mass index (BMI) 30.0-30.9, adult: Secondary | ICD-10-CM

## 2018-05-25 DIAGNOSIS — Z0000 Encounter for general adult medical examination without abnormal findings: Secondary | ICD-10-CM

## 2018-05-25 DIAGNOSIS — R7301 Impaired fasting glucose: Secondary | ICD-10-CM | POA: Diagnosis not present

## 2018-05-25 DIAGNOSIS — E781 Pure hyperglyceridemia: Secondary | ICD-10-CM

## 2018-05-25 MED ORDER — NEBIVOLOL HCL 2.5 MG PO TABS: 2.5000 mg | ORAL_TABLET | Freq: Every day | ORAL | 4 refills | Status: DC | PRN

## 2018-05-25 NOTE — Telephone Encounter (Signed)
Per Luvenia Starch called [facility] and added lab orders of  TSH and HgA1C.  Diagnosis code of R73.01 and [id number]. KG LPN

## 2018-05-25 NOTE — Telephone Encounter (Signed)
Please add TSH and A!C.

## 2018-05-25 NOTE — Telephone Encounter (Signed)
Please add TSH

## 2018-05-25 NOTE — Telephone Encounter (Signed)
Vyvanse 30 mg approved from 05/22/2018 through 05/22/2019. Form sent to scan and pharmacy notified.

## 2018-05-25 NOTE — Progress Notes (Signed)
Subjective:     Kelsey Vaughan is a 42 y.o. female and is here for a comprehensive physical exam. The patient reports no problems.  He has taken vyvanse for 3 days. The first day she had a headache. She denies any side effects today. She does feel like her appetite is suppressed.   Social History   Socioeconomic History  . Marital status: Divorced    Spouse name: Not on file  . Number of children: 3  . Years of education: Not on file  . Highest education level: Not on file  Occupational History  . Occupation: Research scientist (life sciences): [employer]  . Financial resource strain: Not on file  . Food insecurity:    Worry: Not on file    Inability: Not on file  . Transportation needs:    Medical: Not on file    Non-medical: Not on file  Tobacco Use  . Smoking status: Current Every Day Smoker    Years: 6.00    Types: Cigarettes  . Smokeless tobacco: Never Used  . Tobacco comment: 4-5 cigs/day  Substance and Sexual Activity  . Alcohol use: Yes    Comment: occasionally  . Drug use: No  . Sexual activity: Not on file    Comment: tubal ligation  Lifestyle  . Physical activity:    Days per week: Not on file    Minutes per session: Not on file  . Stress: Not on file  Relationships  . Social connections:    Talks on phone: Not on file    Gets together: Not on file    Attends religious service: Not on file    Active member of club or organization: Not on file    Attends meetings of clubs or organizations: Not on file    Relationship status: Not on file  . Intimate partner violence:    Fear of current or ex partner: Not on file    Emotionally abused: Not on file    Physically abused: Not on file    Forced sexual activity: Not on file  Other Topics Concern  . Not on file  Social History Narrative  . Not on file   Health Maintenance  Topic Date Due  . MAMMOGRAM  03/03/2019  . PAP SMEAR  09/16/2020  . TETANUS/TDAP  04/29/2024  . HIV  Screening  Completed  . INFLUENZA VACCINE  Discontinued    The following portions of the patient's history were reviewed and updated as appropriate: allergies, current medications, past family history, past medical history, past social history, past surgical history and problem list.  Review of Systems Pertinent items noted in HPI and remainder of comprehensive ROS otherwise negative.   Objective:    BP 109/65   Pulse 61   Temp 98.1 F (36.7 C) (Oral)   Wt 169 lb (76.7 kg)   BMI 30.91 kg/m  General appearance: alert, cooperative and appears stated age Head: Normocephalic, without obvious abnormality, atraumatic Eyes: conjunctivae/corneas clear. PERRL, EOM's intact. Fundi benign. Ears: normal TM's and external ear canals both ears Nose: Nares normal. Septum midline. Mucosa normal. No drainage or sinus tenderness. Throat: lips, mucosa, and tongue normal; teeth and gums normal Neck: no adenopathy, no carotid bruit, no JVD, supple, symmetrical, trachea midline and thyroid not enlarged, symmetric, no tenderness/mass/nodules Back: symmetric, no curvature. ROM normal. No CVA tenderness. Lungs: clear to auscultation bilaterally Heart: regular rate and rhythm, S1, S2 normal, no murmur, click, rub or gallop Abdomen:  soft, non-tender; bowel sounds normal; no masses,  no organomegaly Extremities: extremities normal, atraumatic, no cyanosis or edema Pulses: 2+ and symmetric Skin: Skin color, texture, turgor normal. No rashes or lesions Lymph nodes: Cervical, supraclavicular, and axillary nodes normal. Neurologic: Alert and oriented X 3, normal strength and tone. Normal symmetric reflexes. Normal coordination and gait    Assessment:    Healthy female exam.      Plan:    [name] KitchenMarland KitchenTonnette was seen today for follow-up and annual exam.  Diagnoses and all orders for this visit:  Routine physical examination  Hypertriglyceridemia  Elevated fasting glucose  Class 1 obesity due to excess  calories without serious comorbidity with body mass index (BMI) of 30.0 to 30.9 in adult  PAC (premature atrial contraction) -     nebivolol (BYSTOLIC) 2.5 MG tablet; Take 1 tablet (2.5 mg total) by mouth daily as needed (palpatations).  Tachycardia, paroxysmal (HCC) -     nebivolol (BYSTOLIC) 2.5 MG tablet; Take 1 tablet (2.5 mg total) by mouth daily as needed (palpatations).   .. Depression screen [facility] 2/9 03/25/2018 02/20/2018 12/24/2017 10/14/2017 09/03/2017  Decreased Interest 3 1 1 1 1   Down, Depressed, Hopeless 2 3 2 1 2   PHQ - 2 Score 5 4 3 2 3   Altered sleeping 3 3 1 2 3   Tired, decreased energy 3 1 1 3 1   Change in appetite 2 1 2 1 3   Feeling bad or failure about yourself  0 2 0 0 0  Trouble concentrating 1 1 1 3  0  Moving slowly or fidgety/restless 0 2 2 0 0  Suicidal thoughts 0 1 0 0 0  PHQ-9 Score 14 15 10 11 10   Difficult doing work/chores Very difficult Very difficult Somewhat difficult - -   .[name] Kitchen Discussed 150 minutes of exercise a week.  Encouraged vitamin D 1000 units and Calcium 1300mg  or 4 servings of dairy a day.  Fasting labs ordered and reviewed today.  Mammogram up to date.  Pap done with Dr. Hulan Vaughan next door. Up to date.   Pt is controlling depression on her own with counseling and exercise.   Elevated fasting glucose. Will add a1c to labs.   Elevated TG on labs. Discussed diet changes. Consider adding fish oil 4000mg  daily. Continue working on weight loss.   Continue on vyvanse. Seems to be helping with appetite and binge eating.  See After Visit Summary for Counseling Recommendations

## 2018-05-25 NOTE — Patient Instructions (Signed)

## 2018-05-26 NOTE — Telephone Encounter (Signed)
Pt also request sickle cell testing. Can we add.

## 2018-05-26 NOTE — Telephone Encounter (Signed)
Can we also add sickle cell screening evaluation at request of patient.

## 2018-05-27 ENCOUNTER — Other Ambulatory Visit: Payer: Self-pay | Admitting: Physician Assistant

## 2018-05-27 ENCOUNTER — Telehealth: Payer: Self-pay | Admitting: *Deleted

## 2018-05-27 DIAGNOSIS — R3129 Other microscopic hematuria: Secondary | ICD-10-CM

## 2018-05-27 NOTE — Telephone Encounter (Signed)
Referral placed.

## 2018-05-28 LAB — TEST AUTHORIZATION

## 2018-05-28 LAB — COMPLETE METABOLIC PANEL WITH GFR
AG Ratio: 1.2 (calc) (ref 1.0–2.5)
ALT: 12 U/L (ref 6–29)
AST: 10 U/L (ref 10–30)
Albumin: 3.7 g/dL (ref 3.6–5.1)
Alkaline phosphatase (APISO): 56 U/L (ref 33–115)
BUN: 12 mg/dL (ref 7–25)
CALCIUM: 9 mg/dL (ref 8.6–10.2)
CO2: 26 mmol/L (ref 20–32)
CREATININE: 0.93 mg/dL (ref 0.50–1.10)
Chloride: 107 mmol/L (ref 98–110)
GFR, EST NON AFRICAN AMERICAN: 76 mL/min/{1.73_m2} (ref >=60)
GFR, Est African American: 88 mL/min/{1.73_m2} (ref >=60)
GLOBULIN: 3.1 g/dL (ref 1.9–3.7)
GLUCOSE: 108 mg/dL — AB (ref 65–99)
Potassium: 4.1 mmol/L (ref 3.5–5.3)
SODIUM: 142 mmol/L (ref 135–146)
Total Bilirubin: 0.2 mg/dL (ref 0.2–1.2)
Total Protein: 6.8 g/dL (ref 6.1–8.1)

## 2018-05-28 LAB — CBC WITH DIFFERENTIAL/PLATELET
BASOS PCT: 0.4 %
Basophils Absolute: 44 cells/uL (ref 0–200)
EOS ABS: 155 {cells}/uL (ref 15–500)
Eosinophils Relative: 1.4 %
HEMATOCRIT: 36.5 % (ref 35.0–45.0)
HEMOGLOBIN: 12.4 g/dL (ref 11.7–15.5)
LYMPHS ABS: 4462 {cells}/uL — AB (ref 850–3900)
MCH: 28.4 pg (ref 27.0–33.0)
MCHC: 34 g/dL (ref 32.0–36.0)
MCV: 83.7 fL (ref 80.0–100.0)
MPV: 12.9 fL — AB (ref 7.5–12.5)
Monocytes Relative: 7.8 %
NEUTROS ABS: 5572 {cells}/uL (ref 1500–7800)
Neutrophils Relative %: 50.2 %
Platelets: 276 10*3/uL (ref 140–400)
RBC: 4.36 10*6/uL (ref 3.80–5.10)
RDW: 13.8 % (ref 11.0–15.0)
Total Lymphocyte: 40.2 %
WBC: 11.1 10*3/uL — ABNORMAL HIGH (ref 3.8–10.8)
WBCMIX: 866 {cells}/uL (ref 200–950)

## 2018-05-28 LAB — VITAMIN B12: Vitamin B-12: 566 pg/mL (ref 200–1100)

## 2018-05-28 LAB — TSH: TSH: 1.03 m[IU]/L

## 2018-05-28 LAB — SICKLE CELL SCREEN: SICKLE SOLUBILITY TEST - HGBRFX: NEGATIVE

## 2018-05-28 LAB — LIPID PANEL W/REFLEX DIRECT LDL
CHOL/HDL RATIO: 3.7 (calc) (ref <5.0)
CHOLESTEROL: 151 mg/dL (ref <200)
HDL: 41 mg/dL — ABNORMAL LOW (ref >50)
LDL Cholesterol (Calc): 69 mg/dL (calc)
Non-HDL Cholesterol (Calc): 110 mg/dL (calc) (ref <130)
Triglycerides: 344 mg/dL — ABNORMAL HIGH (ref <150)

## 2018-05-28 LAB — VITAMIN D 1,25 DIHYDROXY
VITAMIN D 1, 25 (OH) TOTAL: 77 pg/mL — AB (ref 18–72)
VITAMIN D2 1, 25 (OH): 45 pg/mL
VITAMIN D3 1, 25 (OH): 32 pg/mL

## 2018-05-28 LAB — HEMOGLOBIN A1C
EAG (MMOL/L): 6 (calc)
HEMOGLOBIN A1C: 5.4 %{Hb} (ref <5.7)
MEAN PLASMA GLUCOSE: 108 (calc)

## 2018-05-28 NOTE — Telephone Encounter (Signed)
Can we call lab. Ask if the test we ordered checks for sickle cell trait. If not what test does. Pt wants to know if she has trait.

## 2018-05-29 ENCOUNTER — Other Ambulatory Visit: Payer: Self-pay | Admitting: Physician Assistant

## 2018-05-29 DIAGNOSIS — Z130 Encounter for screening for diseases of the blood and blood-forming organs and certain disorders involving the immune mechanism: Secondary | ICD-10-CM

## 2018-05-29 NOTE — Progress Notes (Signed)
Orders place for Sickle Cell Trait per PCP and patient will be advised to get labs completed.

## 2018-05-29 NOTE — Telephone Encounter (Signed)
Let patient know we need more blood for trait. Ok to order and patient to get at her convince.

## 2018-06-01 ENCOUNTER — Other Ambulatory Visit: Payer: Self-pay | Admitting: Physician Assistant

## 2018-06-01 ENCOUNTER — Ambulatory Visit (HOSPITAL_COMMUNITY): Payer: Self-pay | Admitting: Licensed Clinical Social Worker

## 2018-06-01 MED ORDER — ERGOCALCIFEROL 1.25 MG (50000 UT) PO CAPS: 50000.0000 [IU] | ORAL_CAPSULE | ORAL | 4 refills | Status: DC

## 2018-06-02 ENCOUNTER — Encounter: Payer: Self-pay | Admitting: Physician Assistant

## 2018-06-02 NOTE — Telephone Encounter (Signed)
Yes, I did send it but to wrong pharmacy.

## 2018-06-04 ENCOUNTER — Ambulatory Visit (INDEPENDENT_AMBULATORY_CARE_PROVIDER_SITE_OTHER): Payer: 59 | Admitting: Family Medicine

## 2018-06-04 VITALS — BP 120/67 | HR 88 | Wt 166.0 lb

## 2018-06-04 DIAGNOSIS — E538 Deficiency of other specified B group vitamins: Secondary | ICD-10-CM | POA: Diagnosis not present

## 2018-06-04 MED ORDER — CYANOCOBALAMIN 1000 MCG/ML IJ SOLN
1000.0000 ug | Freq: Once | INTRAMUSCULAR | Status: AC
  Administered 2018-06-04: 1000 ug via INTRAMUSCULAR

## 2018-06-04 NOTE — Progress Notes (Signed)
Pt is here for B12 injection. Pt reports no negative side effects from medication. Tolerated injection in RUOQ well, no immediate complications.

## 2018-06-04 NOTE — Progress Notes (Signed)
Agree with documentation as above.   Catherine Metheney, MD  

## 2018-06-15 ENCOUNTER — Ambulatory Visit: Payer: 59 | Admitting: Psychology

## 2018-06-23 ENCOUNTER — Other Ambulatory Visit: Payer: Self-pay | Admitting: Physician Assistant

## 2018-06-23 MED ORDER — CICLOPIROX 8 % EX SOLN: Freq: Every day | CUTANEOUS | 0 refills | Status: DC

## 2018-06-23 MED FILL — CICLOPIROX 8% SOLUTION: 8 | 30 days supply | Qty: 7 | Fill #0

## 2018-06-26 ENCOUNTER — Other Ambulatory Visit: Payer: Self-pay | Admitting: Physician Assistant

## 2018-06-26 DIAGNOSIS — F5081 Binge eating disorder: Secondary | ICD-10-CM

## 2018-06-26 MED ORDER — ERGOCALCIFEROL 1.25 MG (50000 UT) PO CAPS: 50000.0000 [IU] | ORAL_CAPSULE | ORAL | 4 refills | Status: DC

## 2018-06-26 MED ORDER — LISDEXAMFETAMINE DIMESYLATE 30 MG PO CAPS: 30.0000 mg | ORAL_CAPSULE | Freq: Every day | ORAL | 0 refills | Status: DC

## 2018-06-26 MED FILL — VYVANSE 30 MG CAPSULE: 30 | 30 days supply | Qty: 30 | Fill #0

## 2018-06-26 MED FILL — VIT D2 1.25 MG (50,000 UNIT: 1.25 MG | 84 days supply | Qty: 12 | Fill #0

## 2018-06-26 MED FILL — JULEBER 0.15-30 MG-MCG TABS: 0.15-30 | 84 days supply | Qty: 84 | Fill #3

## 2018-07-06 ENCOUNTER — Ambulatory Visit: Payer: Self-pay | Admitting: Psychology

## 2018-07-10 ENCOUNTER — Ambulatory Visit: Payer: Self-pay | Admitting: Sports Medicine

## 2018-07-13 ENCOUNTER — Ambulatory Visit: Payer: Self-pay | Admitting: Physician Assistant

## 2018-07-15 ENCOUNTER — Other Ambulatory Visit: Payer: Self-pay | Admitting: Physician Assistant

## 2018-07-15 ENCOUNTER — Ambulatory Visit (INDEPENDENT_AMBULATORY_CARE_PROVIDER_SITE_OTHER): Payer: 59 | Admitting: Physician Assistant

## 2018-07-15 VITALS — BP 137/78 | HR 97 | Resp 16 | Wt 167.0 lb

## 2018-07-15 DIAGNOSIS — G4452 New daily persistent headache (NDPH): Secondary | ICD-10-CM | POA: Diagnosis not present

## 2018-07-15 DIAGNOSIS — G43101 Migraine with aura, not intractable, with status migrainosus: Secondary | ICD-10-CM

## 2018-07-15 MED ORDER — KETOROLAC TROMETHAMINE 60 MG/2ML IM SOLN
60.0000 mg | Freq: Once | INTRAMUSCULAR | Status: AC
  Administered 2018-07-15: 60 mg via INTRAMUSCULAR

## 2018-07-15 MED ORDER — AMBULATORY NON FORMULARY MEDICATION: 0 refills | Status: DC

## 2018-07-15 MED ORDER — SUMATRIPTAN SUCCINATE 100 MG PO TABS: 100.0000 mg | ORAL_TABLET | Freq: Once | ORAL | 1 refills | Status: DC | PRN

## 2018-07-15 MED ORDER — DEXAMETHASONE SODIUM PHOSPHATE 10 MG/ML IJ SOLN
4.0000 mg | Freq: Once | INTRAMUSCULAR | Status: AC
  Administered 2018-07-15: 4 mg via INTRAMUSCULAR

## 2018-07-15 MED FILL — SUMATRIPTAN SUCC 100 MG TAB: 100 | 30 days supply | Qty: 8 | Fill #0

## 2018-07-15 MED FILL — SALONPAS LIDO GEL PATCH 6: 6 days supply | Qty: 18 | Fill #0

## 2018-07-15 NOTE — Progress Notes (Signed)
sa

## 2018-07-15 NOTE — Progress Notes (Signed)
   Subjective:    Patient ID: Kelsey Vaughan, female    DOB: Nov 19, 1976, 42 y.o.   MRN: [id number]  HPI  Kelsey Vaughan complains of a migraine for two days. She was out of sumatriptan.   Review of Systems     Objective:   Physical Exam        Assessment & Plan:  Migraine - Per Jade Dexamethasone 4 mg and Ketorolac 60 mg given IM. Patient tolerated injection well without complications. Jade refilled sumatriptan.

## 2018-07-27 ENCOUNTER — Other Ambulatory Visit: Payer: Self-pay | Admitting: Physician Assistant

## 2018-07-27 DIAGNOSIS — H11001 Unspecified pterygium of right eye: Secondary | ICD-10-CM

## 2018-07-27 NOTE — Progress Notes (Signed)
Pt request referral of irritated pterygium of right eye medial conjunctiva.

## 2018-07-28 DIAGNOSIS — E669 Obesity, unspecified: Secondary | ICD-10-CM | POA: Diagnosis not present

## 2018-07-28 DIAGNOSIS — R801 Persistent proteinuria, unspecified: Secondary | ICD-10-CM | POA: Diagnosis not present

## 2018-07-28 DIAGNOSIS — Z8669 Personal history of other diseases of the nervous system and sense organs: Secondary | ICD-10-CM | POA: Diagnosis not present

## 2018-07-28 DIAGNOSIS — N039 Chronic nephritic syndrome with unspecified morphologic changes: Secondary | ICD-10-CM | POA: Diagnosis not present

## 2018-07-28 DIAGNOSIS — R3129 Other microscopic hematuria: Secondary | ICD-10-CM | POA: Diagnosis not present

## 2018-08-04 ENCOUNTER — Ambulatory Visit (INDEPENDENT_AMBULATORY_CARE_PROVIDER_SITE_OTHER): Payer: 59 | Admitting: Sports Medicine

## 2018-08-04 DIAGNOSIS — E538 Deficiency of other specified B group vitamins: Secondary | ICD-10-CM | POA: Diagnosis not present

## 2018-08-04 MED ORDER — CYANOCOBALAMIN 1000 MCG/ML IJ SOLN
1000.0000 ug | Freq: Once | INTRAMUSCULAR | Status: AC
  Administered 2018-08-04: 1000 ug via INTRAMUSCULAR

## 2018-08-04 NOTE — Progress Notes (Signed)
Pt presents to the office today in need of her monthly vitamin b12 injection. Injection was placed in the right upper outer quadrant (RUOQ) and given with no complications.

## 2018-08-11 ENCOUNTER — Encounter: Payer: Self-pay | Admitting: Physician Assistant

## 2018-08-11 ENCOUNTER — Other Ambulatory Visit: Payer: Self-pay

## 2018-08-11 ENCOUNTER — Ambulatory Visit (INDEPENDENT_AMBULATORY_CARE_PROVIDER_SITE_OTHER): Payer: 59 | Admitting: Physician Assistant

## 2018-08-11 VITALS — BP 115/70 | HR 91 | Temp 97.7°F | Ht 62.0 in | Wt 166.0 lb

## 2018-08-11 DIAGNOSIS — B379 Candidiasis, unspecified: Secondary | ICD-10-CM

## 2018-08-11 DIAGNOSIS — N039 Chronic nephritic syndrome with unspecified morphologic changes: Secondary | ICD-10-CM | POA: Diagnosis not present

## 2018-08-11 DIAGNOSIS — R350 Frequency of micturition: Secondary | ICD-10-CM

## 2018-08-11 DIAGNOSIS — R8290 Unspecified abnormal findings in urine: Secondary | ICD-10-CM

## 2018-08-11 DIAGNOSIS — N3001 Acute cystitis with hematuria: Secondary | ICD-10-CM | POA: Diagnosis not present

## 2018-08-11 LAB — POCT URINALYSIS DIPSTICK
BILIRUBIN UA: NEGATIVE
Glucose, UA: NEGATIVE
KETONES UA: NEGATIVE
Nitrite, UA: POSITIVE
Protein, UA: POSITIVE — AB
Spec Grav, UA: >=1.03 — AB (ref 1.010–1.025)
Urobilinogen, UA: 0.2 E.U./dL
pH, UA: 6.5 (ref 5.0–8.0)

## 2018-08-11 MED ORDER — NITROFURANTOIN MONOHYD MACRO 100 MG PO CAPS: 100.0000 mg | ORAL_CAPSULE | Freq: Two times a day (BID) | ORAL | 0 refills | Status: DC

## 2018-08-11 MED ORDER — FLUCONAZOLE 150 MG PO TABS
150.0000 mg | ORAL_TABLET | Freq: Once | ORAL | 1 refills | Status: AC
Start: 2018-08-11 — End: 2018-08-11

## 2018-08-11 MED ORDER — CEFTRIAXONE SODIUM 1 G IJ SOLR
1.0000 g | Freq: Once | INTRAMUSCULAR | Status: AC
  Administered 2018-08-11: 1 g via INTRAMUSCULAR

## 2018-08-11 MED FILL — NITROFURANTOIN MONO-MCR 100: 100 | 7 days supply | Qty: 14 | Fill #0

## 2018-08-11 MED FILL — FLUCONAZOLE 150 MG TABS: 150 | 1 days supply | Qty: 1 | Fill #0

## 2018-08-11 NOTE — Progress Notes (Signed)
Per verbal from PCP Diflucan Rx sent to pharmacy.
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
## 2018-08-12 ENCOUNTER — Ambulatory Visit (INDEPENDENT_AMBULATORY_CARE_PROVIDER_SITE_OTHER): Payer: 59 | Admitting: Family Medicine

## 2018-08-12 ENCOUNTER — Other Ambulatory Visit: Payer: Self-pay | Admitting: Physician Assistant

## 2018-08-12 ENCOUNTER — Ambulatory Visit (INDEPENDENT_AMBULATORY_CARE_PROVIDER_SITE_OTHER): Payer: 59

## 2018-08-12 ENCOUNTER — Encounter: Payer: Self-pay | Admitting: Family Medicine

## 2018-08-12 VITALS — BP 102/59 | HR 98 | Ht 62.0 in | Wt 165.0 lb

## 2018-08-12 DIAGNOSIS — M79672 Pain in left foot: Secondary | ICD-10-CM

## 2018-08-12 DIAGNOSIS — M7742 Metatarsalgia, left foot: Secondary | ICD-10-CM

## 2018-08-12 NOTE — Progress Notes (Signed)
Order placed for imaging prior to appt with Provider for eval of left foot pain.

## 2018-08-12 NOTE — Patient Instructions (Signed)
Thank you for coming in today. Use the metatarsal pads.  Recheck as needed.

## 2018-08-12 NOTE — Progress Notes (Signed)
Kelsey Vaughan is a 42 y.o. female who presents to [facility] today for foot pain.  Kelsey Vaughan notes a 1 day history of pain at the plantar fourth MTP.  Pain started without any change in activity or injury.  Pain is worse with ambulation and better with rest.  She has not had a chance to try much treatment yet.  She does have a history of having custom orthotics but has not been using them recently as her foot has not been bothersome.  She denies any radiating pain weakness or numbness fevers or chills.    ROS:  As above  Exam:  BP (!) 102/59   Pulse 98   Ht 5\' 2"  (1.575 m)   Wt 165 lb (74.8 kg)   BMI 30.18 kg/m  General: Well Developed, well nourished, and in no acute distress.  Neuro/Psych: Alert and oriented x3, extra-ocular muscles intact, able to move all 4 extremities, sensation grossly intact. Skin: Warm and dry, no rashes noted.  Respiratory: Not using accessory muscles, speaking in full sentences, trachea midline.  Cardiovascular: Pulses palpable, no extremity edema. Abdomen: Does not appear distended. MSK: Left foot largely normal-appearing with no deformities. Normal foot and ankle motion pulses capillary fill and sensation. Foot and ankle strength are intact. Tender to palpation plantar fourth MTP.     Assessment and Plan: 42 y.o. female with  Left foot pain likely metatarsalgia at fourth MTP.  Plan for metatarsal pads and advance activity as tolerated.  Patient will bring in her orthotics so that we can add a metatarsal pad to the orthotics as well in the near future. Recheck with me as needed.    No orders of the defined types were placed in this encounter.  No orders of the defined types were placed in this encounter.   Historical information moved to improve visibility of documentation.  Past Medical History:  Diagnosis Date  . Anxiety   . Atrial fibrillation ([place])    a. occuring in 02/2016  . B12  deficiency   . Constipation   . Foreign body in foot, left 07/2013  . GERD (gastroesophageal reflux disease)   . Hematuria   . Irregular menses    due to oral contraceptive  . Palpitations    a. 01/2017: Event monitor showing SR with PVC's.   . Sinus tachycardia   . Thyroid nodule   . Vitamin D deficiency    Past Surgical History:  Procedure Laterality Date  . CESAREAN SECTION    . FOREIGN BODY REMOVAL Left 08/05/2013   Procedure: LEFT FOOT FOREIGN BODY REMOVAL ADULT;  Surgeon: Wylene Simmer, MD;  Location: [facility];  Service: Orthopedics;  Laterality: Left;  . SHOULDER ARTHROSCOPY Left   . TUBAL LIGATION     Social History   Tobacco Use  . Smoking status: Current Every Day Smoker    Years: 6.00    Types: Cigarettes  . Smokeless tobacco: Never Used  . Tobacco comment: 4-5 cigs/day  Substance Use Topics  . Alcohol use: Yes    Comment: occasionally   family history includes CAD in her father; Diabetes in her mother; Hypertension in her mother; Kidney disease in her mother.  Medications: Current Outpatient Medications  Medication Sig Dispense Refill  . AMBULATORY NON FORMULARY MEDICATION chiropractor services for upper neck pain, mid back pain and muscle spasms. 1 application 0  . AMBULATORY NON FORMULARY MEDICATION salonpass patches with lidocaine may change every 8 hours as needed  for pain. 90 patch 0  . cycloSPORINE (RESTASIS MULTIDOSE) 0.05 % ophthalmic emulsion Place 1 drop into both eyes 2 (two) times daily. (Patient taking differently: Place 1 drop into both eyes 3 times/day as needed-between meals & bedtime. ) 0.4 mL 2  . desogestrel-ethinyl estradiol (APRI,EMOQUETTE,SOLIA) 0.15-30 MG-MCG tablet Take 1 tablet by mouth daily. 3 Package 4  . ergocalciferol (VITAMIN D2) 50000 units capsule Take 1 capsule (50,000 Units total) by mouth once a week. 12 capsule 4  . levocetirizine (XYZAL) 5 MG tablet Take 1 tablet (5 mg total) by mouth every evening. 90 tablet 1    . lisdexamfetamine (VYVANSE) 30 MG capsule Take 1 capsule (30 mg total) by mouth daily. For binge eating disorder. 30 capsule 0  . meclizine (ANTIVERT) 25 MG tablet Take 1 tablet (25 mg total) by mouth 3 (three) times daily as needed for dizziness or nausea. 30 tablet 3  . nebivolol (BYSTOLIC) 2.5 MG tablet Take 1 tablet (2.5 mg total) by mouth daily as needed (palpatations). 90 tablet 4  . nitrofurantoin, macrocrystal-monohydrate, (MACROBID) 100 MG capsule Take 1 capsule (100 mg total) by mouth 2 (two) times daily. For 7 days. 14 capsule 0  . SUMAtriptan (IMITREX) 100 MG tablet Take 1 tablet (100 mg total) by mouth once as needed for up to 1 dose for migraine. May repeat in 2 hours if headache persists or recurs. 10 tablet 1  . valACYclovir (VALTREX) 1000 MG tablet Take 1 tablet (1,000 mg total) by mouth 2 (two) times daily. (Patient taking differently: Take 1,000 mg by mouth as needed. ) 14 tablet 11   No current facility-administered medications for this visit.    Allergies  Allergen Reactions  . Influenza Vaccines Hives and Rash  . Wellbutrin [Bupropion] Other (See Comments)    Possible nipple soreness/headaches/not feeling right      Discussed warning signs or symptoms. Please see discharge instructions. Patient expresses understanding.

## 2018-08-13 LAB — URINE CULTURE
MICRO NUMBER:: 91023471
SPECIMEN QUALITY:: ADEQUATE

## 2018-08-13 NOTE — Progress Notes (Signed)
Call pt: let her know e.coli found macrobid should work to treat.

## 2018-08-28 ENCOUNTER — Telehealth: Payer: Self-pay | Admitting: Physician Assistant

## 2018-08-28 MED ORDER — DIMENHYDRINATE 50 MG PO TABS: 50.0000 mg | ORAL_TABLET | Freq: Three times a day (TID) | ORAL | 0 refills | Status: DC | PRN

## 2018-08-28 NOTE — Telephone Encounter (Signed)
Request dramamine rx so she can use her flex spending.

## 2018-09-01 ENCOUNTER — Ambulatory Visit (INDEPENDENT_AMBULATORY_CARE_PROVIDER_SITE_OTHER): Payer: 59 | Admitting: Physician Assistant

## 2018-09-01 ENCOUNTER — Encounter: Payer: Self-pay | Admitting: Physician Assistant

## 2018-09-01 VITALS — BP 119/71 | HR 102 | Ht 62.0 in | Wt 172.0 lb

## 2018-09-01 DIAGNOSIS — R5383 Other fatigue: Secondary | ICD-10-CM

## 2018-09-01 DIAGNOSIS — E6609 Other obesity due to excess calories: Secondary | ICD-10-CM | POA: Diagnosis not present

## 2018-09-01 DIAGNOSIS — Z13220 Encounter for screening for lipoid disorders: Secondary | ICD-10-CM

## 2018-09-01 DIAGNOSIS — E559 Vitamin D deficiency, unspecified: Secondary | ICD-10-CM | POA: Diagnosis not present

## 2018-09-01 DIAGNOSIS — F430 Acute stress reaction: Secondary | ICD-10-CM

## 2018-09-01 DIAGNOSIS — Z79899 Other long term (current) drug therapy: Secondary | ICD-10-CM | POA: Diagnosis not present

## 2018-09-01 DIAGNOSIS — G479 Sleep disorder, unspecified: Secondary | ICD-10-CM

## 2018-09-01 DIAGNOSIS — Z6830 Body mass index (BMI) 30.0-30.9, adult: Secondary | ICD-10-CM

## 2018-09-01 DIAGNOSIS — E66811 Obesity, class 1: Secondary | ICD-10-CM

## 2018-09-01 NOTE — Patient Instructions (Signed)
Unisom 
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
## 2018-09-02 DIAGNOSIS — Z79899 Other long term (current) drug therapy: Secondary | ICD-10-CM | POA: Diagnosis not present

## 2018-09-02 DIAGNOSIS — Z13220 Encounter for screening for lipoid disorders: Secondary | ICD-10-CM | POA: Diagnosis not present

## 2018-09-02 DIAGNOSIS — R5383 Other fatigue: Secondary | ICD-10-CM | POA: Diagnosis not present

## 2018-09-02 DIAGNOSIS — E559 Vitamin D deficiency, unspecified: Secondary | ICD-10-CM | POA: Diagnosis not present

## 2018-09-03 ENCOUNTER — Encounter: Payer: Self-pay | Admitting: Physician Assistant

## 2018-09-03 DIAGNOSIS — R5383 Other fatigue: Secondary | ICD-10-CM | POA: Insufficient documentation

## 2018-09-03 DIAGNOSIS — F430 Acute stress reaction: Secondary | ICD-10-CM | POA: Insufficient documentation

## 2018-09-03 DIAGNOSIS — G479 Sleep disorder, unspecified: Secondary | ICD-10-CM | POA: Insufficient documentation

## 2018-09-03 NOTE — Progress Notes (Signed)
We can talk about tomorrow in clinic but lipase back up again. I do not think you tolerate saxenda well.  TG better.  LDL up but still under 100 which is good.

## 2018-09-04 ENCOUNTER — Ambulatory Visit: Payer: 59

## 2018-09-07 LAB — B12 AND FOLATE PANEL
Folate: 6.9 ng/mL
VITAMIN B 12: 436 pg/mL (ref 200–1100)

## 2018-09-07 LAB — LIPID PANEL W/REFLEX DIRECT LDL
CHOLESTEROL: 182 mg/dL (ref <200)
HDL: 52 mg/dL (ref >50)
LDL Cholesterol (Calc): 94 mg/dL (calc)
Non-HDL Cholesterol (Calc): 130 mg/dL (calc) — ABNORMAL HIGH (ref <130)
Total CHOL/HDL Ratio: 3.5 (calc) (ref <5.0)
Triglycerides: 240 mg/dL — ABNORMAL HIGH (ref <150)

## 2018-09-07 LAB — CBC WITH DIFFERENTIAL/PLATELET
BASOS PCT: 0.3 %
Basophils Absolute: 31 cells/uL (ref 0–200)
EOS ABS: 214 {cells}/uL (ref 15–500)
Eosinophils Relative: 2.1 %
HCT: 35.8 % (ref 35.0–45.0)
HEMOGLOBIN: 12.3 g/dL (ref 11.7–15.5)
Lymphs Abs: 3825 cells/uL (ref 850–3900)
MCH: 28.7 pg (ref 27.0–33.0)
MCHC: 34.4 g/dL (ref 32.0–36.0)
MCV: 83.6 fL (ref 80.0–100.0)
MONOS PCT: 7 %
MPV: 12.3 fL (ref 7.5–12.5)
NEUTROS ABS: 5416 {cells}/uL (ref 1500–7800)
Neutrophils Relative %: 53.1 %
Platelets: 312 10*3/uL (ref 140–400)
RBC: 4.28 10*6/uL (ref 3.80–5.10)
RDW: 14.2 % (ref 11.0–15.0)
Total Lymphocyte: 37.5 %
WBC: 10.2 10*3/uL (ref 3.8–10.8)
WBCMIX: 714 {cells}/uL (ref 200–950)

## 2018-09-07 LAB — VITAMIN D 1,25 DIHYDROXY
VITAMIN D2 1, 25 (OH): 80 pg/mL
Vitamin D 1, 25 (OH)2 Total: 96 pg/mL — ABNORMAL HIGH (ref 18–72)
Vitamin D3 1, 25 (OH)2: 16 pg/mL

## 2018-09-07 LAB — LIPASE: Lipase: 75 U/L — ABNORMAL HIGH (ref 7–60)

## 2018-09-07 NOTE — Progress Notes (Signed)
Ok to give b12 shot every 2 weeks recheck in 3 months.

## 2018-09-08 ENCOUNTER — Ambulatory Visit (INDEPENDENT_AMBULATORY_CARE_PROVIDER_SITE_OTHER): Payer: 59 | Admitting: Physician Assistant

## 2018-09-08 VITALS — BP 126/79 | HR 105 | Ht 62.0 in | Wt 173.0 lb

## 2018-09-08 DIAGNOSIS — E538 Deficiency of other specified B group vitamins: Secondary | ICD-10-CM | POA: Diagnosis not present

## 2018-09-08 MED ORDER — CYANOCOBALAMIN 1000 MCG/ML IJ SOLN
1000.0000 ug | Freq: Once | INTRAMUSCULAR | Status: AC
  Administered 2018-09-08: 1000 ug via INTRAMUSCULAR

## 2018-09-08 NOTE — Progress Notes (Signed)
Patient presents to the clinic today for a vitamin b12 shot. Patient needs to have vitamin b12 shots every two weeks. Provider is Iran Planas, PA-C. Cyanocobalmin 1000 mcg injection given in the RUOQ with no complications.   Agree with above plan. Iran Planas PA-C

## 2018-09-24 ENCOUNTER — Ambulatory Visit (INDEPENDENT_AMBULATORY_CARE_PROVIDER_SITE_OTHER): Payer: 59 | Admitting: Family Medicine

## 2018-09-24 VITALS — BP 114/79

## 2018-09-24 DIAGNOSIS — E538 Deficiency of other specified B group vitamins: Secondary | ICD-10-CM | POA: Diagnosis not present

## 2018-09-24 MED ORDER — CYANOCOBALAMIN 1000 MCG/ML IJ SOLN
1000.0000 ug | Freq: Once | INTRAMUSCULAR | Status: AC
  Administered 2018-09-24: 1000 ug via INTRAMUSCULAR

## 2018-09-24 NOTE — Progress Notes (Signed)
Patient presents to clinic today for a Vitamin b12 shot.  Patient receives Vitamin b12 shots monthly. Shot given in [place] with no complications. Casilda Carls, CMA.

## 2018-09-25 ENCOUNTER — Other Ambulatory Visit: Payer: Self-pay | Admitting: Physician Assistant

## 2018-09-25 MED ORDER — DIMENHYDRINATE 50 MG PO TABS: 50.0000 mg | ORAL_TABLET | Freq: Three times a day (TID) | ORAL | 0 refills | Status: DC | PRN

## 2018-09-28 ENCOUNTER — Ambulatory Visit (INDEPENDENT_AMBULATORY_CARE_PROVIDER_SITE_OTHER): Payer: 59 | Admitting: Family Medicine

## 2018-09-28 ENCOUNTER — Encounter: Payer: Self-pay | Admitting: Family Medicine

## 2018-09-28 VITALS — BP 106/70 | HR 90 | Ht 62.0 in | Wt 173.0 lb

## 2018-09-28 DIAGNOSIS — G8929 Other chronic pain: Secondary | ICD-10-CM | POA: Diagnosis not present

## 2018-09-28 DIAGNOSIS — M533 Sacrococcygeal disorders, not elsewhere classified: Secondary | ICD-10-CM | POA: Diagnosis not present

## 2018-09-28 DIAGNOSIS — M545 Low back pain, unspecified: Secondary | ICD-10-CM

## 2018-09-28 NOTE — Patient Instructions (Signed)
You have SI joint dysfunction.  It's possible you have some underlying back arthritis contributing to this too. Try to avoid painful activities when possible. Pick 2-3 stretches where you feel the pull in the area of pain - do 3 of these and hold for 20-30 seconds twice a day. Standing hip rotation, hip side raises 3 sets of 10 once a day.  Add ankle weights if these become too easy. Voltaren gel up to 4 times a day. Start physical therapy for this. Let me know if you want to try the prednisone dose pack. Follow up with me in 5-6 weeks otherwise.

## 2018-09-29 ENCOUNTER — Encounter: Payer: Self-pay | Admitting: Family Medicine

## 2018-09-29 NOTE — Progress Notes (Signed)
PCP: Donella Stade, PA-C  Subjective:   HPI: Patient is a 42 y.o. female here for low back pain.  Patient reports she's had about 1 month of pain in low back more to left side at waist radiating into left hip. Worse with sitting. Has standup desk at work which helps. Pain level up to 7/10 and sharp. Did home exercises and physical therapy a couple months ago for what sounds like bursitis/IT band. Has been taking tylenol. No skin changes, numbness.  Past Medical History:  Diagnosis Date  . Anxiety   . Atrial fibrillation ([place])    a. occuring in 02/2016  . B12 deficiency   . Constipation   . Foreign body in foot, left 07/2013  . GERD (gastroesophageal reflux disease)   . Hematuria   . Irregular menses    due to oral contraceptive  . Palpitations    a. 01/2017: Event monitor showing SR with PVC's.   . Sinus tachycardia   . Thyroid nodule   . Vitamin D deficiency     Current Outpatient Medications on File Prior to Visit  Medication Sig Dispense Refill  . AMBULATORY NON FORMULARY MEDICATION chiropractor services for upper neck pain, mid back pain and muscle spasms. 1 application 0  . AMBULATORY NON FORMULARY MEDICATION salonpass patches with lidocaine may change every 8 hours as needed for pain. 90 patch 0  . AMBULATORY NON FORMULARY MEDICATION Take 5 mg by mouth daily. Release tablets    . cycloSPORINE (RESTASIS MULTIDOSE) 0.05 % ophthalmic emulsion Place 1 drop into both eyes 2 (two) times daily. (Patient taking differently: Place 1 drop into both eyes 3 times/day as needed-between meals & bedtime. ) 0.4 mL 2  . desogestrel-ethinyl estradiol (APRI,EMOQUETTE,SOLIA) 0.15-30 MG-MCG tablet Take 1 tablet by mouth daily. 3 Package 4  . dimenhyDRINATE (DRAMAMINE) 50 MG tablet Take 1 tablet (50 mg total) by mouth every 8 (eight) hours as needed. 30 tablet 0  . ergocalciferol (VITAMIN D2) 50000 units capsule Take 1 capsule (50,000 Units total) by mouth once a week. 12 capsule 4  .  levocetirizine (XYZAL) 5 MG tablet Take 1 tablet (5 mg total) by mouth every evening. 90 tablet 1  . meclizine (ANTIVERT) 25 MG tablet Take 1 tablet (25 mg total) by mouth 3 (three) times daily as needed for dizziness or nausea. 30 tablet 3  . nebivolol (BYSTOLIC) 2.5 MG tablet Take 1 tablet (2.5 mg total) by mouth daily as needed (palpatations). 90 tablet 4  . SUMAtriptan (IMITREX) 100 MG tablet Take 1 tablet (100 mg total) by mouth once as needed for up to 1 dose for migraine. May repeat in 2 hours if headache persists or recurs. 10 tablet 1  . valACYclovir (VALTREX) 1000 MG tablet Take 1 tablet (1,000 mg total) by mouth 2 (two) times daily. (Patient taking differently: Take 1,000 mg by mouth as needed. ) 14 tablet 11   No current facility-administered medications on file prior to visit.     Past Surgical History:  Procedure Laterality Date  . CESAREAN SECTION    . FOREIGN BODY REMOVAL Left 08/05/2013   Procedure: LEFT FOOT FOREIGN BODY REMOVAL ADULT;  Surgeon: Wylene Simmer, MD;  Location: [facility];  Service: Orthopedics;  Laterality: Left;  . SHOULDER ARTHROSCOPY Left   . TUBAL LIGATION      Allergies  Allergen Reactions  . Influenza Vaccines Hives and Rash  . Vyvanse [Lisdexamfetamine Dimesylate]     headache  . Wellbutrin [Bupropion] Other (See  Comments)    Possible nipple soreness/headaches/not feeling right    Social History   Socioeconomic History  . Marital status: Divorced    Spouse name: Not on file  . Number of children: 3  . Years of education: Not on file  . Highest education level: Not on file  Occupational History  . Occupation: Research scientist (life sciences): [employer]  . Financial resource strain: Not on file  . Food insecurity:    Worry: Not on file    Inability: Not on file  . Transportation needs:    Medical: Not on file    Non-medical: Not on file  Tobacco Use  . Smoking status: Current Every Day Smoker     Years: 6.00    Types: Cigarettes  . Smokeless tobacco: Never Used  . Tobacco comment: 4-5 cigs/day  Substance and Sexual Activity  . Alcohol use: Yes    Comment: occasionally  . Drug use: No  . Sexual activity: Not on file    Comment: tubal ligation  Lifestyle  . Physical activity:    Days per week: Not on file    Minutes per session: Not on file  . Stress: Not on file  Relationships  . Social connections:    Talks on phone: Not on file    Gets together: Not on file    Attends religious service: Not on file    Active member of club or organization: Not on file    Attends meetings of clubs or organizations: Not on file    Relationship status: Not on file  . Intimate partner violence:    Fear of current or ex partner: Not on file    Emotionally abused: Not on file    Physically abused: Not on file    Forced sexual activity: Not on file  Other Topics Concern  . Not on file  Social History Narrative  . Not on file    Family History  Problem Relation Age of Onset  . Diabetes Mother   . Hypertension Mother   . Kidney disease Mother   . CAD Father   . Breast cancer Neg Hx     BP 106/70   Pulse 90   Ht 5\' 2"  (1.575 m)   Wt 173 lb (78.5 kg)   BMI 31.64 kg/m   Review of Systems: See HPI above.     Objective:  Physical Exam:  Gen: NAD, comfortable in exam room  Back: No gross deformity, scoliosis. TTP left SI joint, less paraspinal lumbar regions.  No midline or bony TTP. FROM. Strength LEs 5/5 all muscle groups.   2+ MSRs in patellar and achilles tendons, equal bilaterally. Negative SLRs. Sensation intact to light touch bilaterally.  Left hip: No deformity. FROM with 5/5 strength. Minimal tenderness over bilateral greater trochanters. NVI distally. Negative logroll Positive fabers, negative piriformis.   Assessment & Plan:  1. Low back/hip pain - consistent with SI joint dysfunction though discussed she may have some underlying arthropathy as well.   She will start physical therapy for this - reviewed home exercises and stretches she can start now.  Voltaren gel.  Consider prednisone dose pack.  F/u in 5-6 weeks.

## 2018-09-29 NOTE — Progress Notes (Signed)
HPI: FU palpitations and PAF. Echo 4/17 ([facility]) normal LV function. ABIs 3/18 normal. TSH 4/18 normal. Pt had episode of atrial fibrillation 3/17. Event monitor 2/18 showed sinus with PVCs. Bystolic increased 2/77 because of palpitations associated with PVCs. Since last seen,  patient denies chest pain.  Occasional brief palpitations.  Not sustained.  Mild dyspnea on exertion but no orthopnea, PND or pedal edema.  No syncope.  Current Outpatient Medications  Medication Sig Dispense Refill  . AMBULATORY NON FORMULARY MEDICATION salonpass patches with lidocaine may change every 8 hours as needed for pain. 90 patch 0  . AMBULATORY NON FORMULARY MEDICATION Take 5 mg by mouth daily. Release tablets    . cycloSPORINE (RESTASIS MULTIDOSE) 0.05 % ophthalmic emulsion Place 1 drop into both eyes 2 (two) times daily. (Patient taking differently: Place 1 drop into both eyes 3 times/day as needed-between meals & bedtime. ) 0.4 mL 2  . desogestrel-ethinyl estradiol (APRI,EMOQUETTE,SOLIA) 0.15-30 MG-MCG tablet Take 1 tablet by mouth daily. 3 Package 4  . dimenhyDRINATE (DRAMAMINE) 50 MG tablet Take 1 tablet (50 mg total) by mouth every 8 (eight) hours as needed. 30 tablet 0  . levocetirizine (XYZAL) 5 MG tablet Take 1 tablet (5 mg total) by mouth every evening. 90 tablet 1  . nebivolol (BYSTOLIC) 2.5 MG tablet Take 1 tablet (2.5 mg total) by mouth daily as needed (palpatations). 90 tablet 4  . SUMAtriptan (IMITREX) 100 MG tablet Take 1 tablet (100 mg total) by mouth once as needed for up to 1 dose for migraine. May repeat in 2 hours if headache persists or recurs. 10 tablet 1  . valACYclovir (VALTREX) 1000 MG tablet Take 1 tablet (1,000 mg total) by mouth 2 (two) times daily. (Patient taking differently: Take 1,000 mg by mouth as needed. ) 14 tablet 11   No current facility-administered medications for this visit.      Past Medical History:  Diagnosis Date  . Anxiety   . Atrial fibrillation ([place])      a. occuring in 02/2016  . B12 deficiency   . Constipation   . Foreign body in foot, left 07/2013  . GERD (gastroesophageal reflux disease)   . Hematuria   . Irregular menses    due to oral contraceptive  . Palpitations    a. 01/2017: Event monitor showing SR with PVC's.   . Sinus tachycardia   . Thyroid nodule   . Vitamin D deficiency     Past Surgical History:  Procedure Laterality Date  . CESAREAN SECTION    . FOREIGN BODY REMOVAL Left 08/05/2013   Procedure: LEFT FOOT FOREIGN BODY REMOVAL ADULT;  Surgeon: Wylene Simmer, MD;  Location: [facility];  Service: Orthopedics;  Laterality: Left;  . SHOULDER ARTHROSCOPY Left   . TUBAL LIGATION      Social History   Socioeconomic History  . Marital status: Divorced    Spouse name: Not on file  . Number of children: 3  . Years of education: Not on file  . Highest education level: Not on file  Occupational History  . Occupation: Research scientist (life sciences): [employer]  . Financial resource strain: Not on file  . Food insecurity:    Worry: Not on file    Inability: Not on file  . Transportation needs:    Medical: Not on file    Non-medical: Not on file  Tobacco Use  . Smoking status: Current Every Day Smoker  Years: 6.00    Types: Cigarettes  . Smokeless tobacco: Never Used  . Tobacco comment: 4-5 cigs/day  Substance and Sexual Activity  . Alcohol use: Yes    Comment: occasionally  . Drug use: No  . Sexual activity: Not on file    Comment: tubal ligation  Lifestyle  . Physical activity:    Days per week: Not on file    Minutes per session: Not on file  . Stress: Not on file  Relationships  . Social connections:    Talks on phone: Not on file    Gets together: Not on file    Attends religious service: Not on file    Active member of club or organization: Not on file    Attends meetings of clubs or organizations: Not on file    Relationship status: Not on file  .  Intimate partner violence:    Fear of current or ex partner: Not on file    Emotionally abused: Not on file    Physically abused: Not on file    Forced sexual activity: Not on file  Other Topics Concern  . Not on file  Social History Narrative  . Not on file    Family History  Problem Relation Age of Onset  . Diabetes Mother   . Hypertension Mother   . Kidney disease Mother   . CAD Father   . Breast cancer Neg Hx     ROS: no fevers or chills, productive cough, hemoptysis, dysphasia, odynophagia, melena, hematochezia, dysuria, hematuria, rash, seizure activity, orthopnea, PND, pedal edema, claudication. Remaining systems are negative.  Physical Exam: Well-developed well-nourished in no acute distress.  Skin is warm and dry.  HEENT is normal.  Neck is supple.  Chest is clear to auscultation with normal expansion.  Cardiovascular exam is regular rate and rhythm.  Abdominal exam nontender or distended. No masses palpated. Extremities show no edema. neuro grossly intact  ECG-normal sinus rhythm at a rate of 93.  No ST changes.  Personally reviewed  A/P  1 paroxysmal atrial fibrillation-patient is in sinus rhythm today.  We will continue with beta-blocker for rate control if atrial fibrillation recurs.  Continue aspirin. CHADSvasc 1 for female sex.  If she has more frequent episodes in the future we will consider antiarrhythmic.  2 palpitations-symptoms are controlled on present dose of beta-blocker.  Will continue.  3 tobacco abuse-patient counseled on discontinuing.  Kirk Ruths, MD

## 2018-10-06 ENCOUNTER — Other Ambulatory Visit: Payer: Self-pay

## 2018-10-06 DIAGNOSIS — Z3009 Encounter for other general counseling and advice on contraception: Secondary | ICD-10-CM

## 2018-10-06 MED ORDER — DESOGESTREL-ETHINYL ESTRADIOL 0.15-30 MG-MCG PO TABS: 1.0000 | ORAL_TABLET | Freq: Every day | ORAL | 4 refills | Status: DC

## 2018-10-06 MED FILL — JULEBER 0.15-30 MG-MCG TABS: 0.15-30 | 84 days supply | Qty: 84 | Fill #0

## 2018-10-07 ENCOUNTER — Ambulatory Visit (INDEPENDENT_AMBULATORY_CARE_PROVIDER_SITE_OTHER): Payer: 59 | Admitting: Cardiology

## 2018-10-07 ENCOUNTER — Encounter: Payer: Self-pay | Admitting: Cardiology

## 2018-10-07 VITALS — BP 118/83 | HR 93 | Ht 62.0 in | Wt 178.4 lb

## 2018-10-07 DIAGNOSIS — R002 Palpitations: Secondary | ICD-10-CM | POA: Diagnosis not present

## 2018-10-07 DIAGNOSIS — I480 Paroxysmal atrial fibrillation: Secondary | ICD-10-CM | POA: Diagnosis not present

## 2018-10-07 DIAGNOSIS — F17200 Nicotine dependence, unspecified, uncomplicated: Secondary | ICD-10-CM | POA: Diagnosis not present

## 2018-10-07 NOTE — Patient Instructions (Signed)
Your physician recommends that you schedule a follow-up appointment in: ONE YEAR WITH DR CRENSHAW PLEASE GIVE OUR OFFICE A CALL 2 MONTHS PRIOR TO THAT APPOINTMENT TIME TO SCHEDULE   

## 2018-10-08 ENCOUNTER — Ambulatory Visit (INDEPENDENT_AMBULATORY_CARE_PROVIDER_SITE_OTHER): Payer: 59 | Admitting: Family Medicine

## 2018-10-08 ENCOUNTER — Encounter: Payer: Self-pay | Admitting: Family Medicine

## 2018-10-08 VITALS — BP 122/83 | HR 106 | Ht 62.0 in | Wt 175.0 lb

## 2018-10-08 VITALS — BP 114/79

## 2018-10-08 DIAGNOSIS — M533 Sacrococcygeal disorders, not elsewhere classified: Secondary | ICD-10-CM

## 2018-10-08 DIAGNOSIS — E538 Deficiency of other specified B group vitamins: Secondary | ICD-10-CM | POA: Diagnosis not present

## 2018-10-08 MED ORDER — CYANOCOBALAMIN 1000 MCG/ML IJ SOLN
1000.0000 ug | Freq: Once | INTRAMUSCULAR | Status: AC
  Administered 2018-10-08: 1000 ug via INTRAMUSCULAR

## 2018-10-08 MED ORDER — PREDNISONE 10 MG PO TABS: ORAL_TABLET | ORAL | 0 refills | Status: DC

## 2018-10-08 MED FILL — predniSONE 10 MG TABS: 10 | 6 days supply | Qty: 21 | Fill #0

## 2018-10-08 NOTE — Patient Instructions (Addendum)
You have SI joint dysfunction.   Try to avoid painful activities when possible. Start prednisone dose pack x 6 days. Pick 2-3 stretches where you feel the pull in the area of pain - do 3 of these and hold for 20-30 seconds twice a day. Standing hip rotation, hip side raises 3 sets of 10 once a day.  Add ankle weights if these become too easy. Voltaren gel up to 4 times a day. Call Johnston Ebbs at [facility] for evaluation and treatment [phone]) Let me know if you're still not improving and we would go ahead with ultrasound guided SI joint injection.

## 2018-10-08 NOTE — Progress Notes (Signed)
Patient presents to clinic today for a Vitamin b12 shot. Shot given in the [facility]OQ without complications. No further questions or concerns at this time. Casilda Carls, CMA.

## 2018-10-12 ENCOUNTER — Other Ambulatory Visit: Payer: Self-pay | Admitting: Sports Medicine

## 2018-10-12 ENCOUNTER — Encounter: Payer: Self-pay | Admitting: Family Medicine

## 2018-10-12 ENCOUNTER — Ambulatory Visit: Payer: Self-pay | Admitting: Obstetrics & Gynecology

## 2018-10-12 ENCOUNTER — Ambulatory Visit (INDEPENDENT_AMBULATORY_CARE_PROVIDER_SITE_OTHER): Payer: 59 | Admitting: Physician Assistant

## 2018-10-12 VITALS — BP 113/69 | HR 92 | Ht 62.0 in | Wt 175.0 lb

## 2018-10-12 DIAGNOSIS — M545 Low back pain, unspecified: Secondary | ICD-10-CM

## 2018-10-12 MED ORDER — DICLOFENAC SODIUM 1 % TD GEL: 4.0000 g | Freq: Four times a day (QID) | TRANSDERMAL | 11 refills | Status: DC

## 2018-10-12 MED ORDER — KETOROLAC TROMETHAMINE 60 MG/2ML IM SOLN
60.0000 mg | Freq: Once | INTRAMUSCULAR | Status: AC
  Administered 2018-10-12: 60 mg via INTRAMUSCULAR

## 2018-10-12 NOTE — Progress Notes (Signed)
PCP: Donella Stade, PA-C  Subjective:   HPI: Patient is a 42 y.o. female here for low back pain.  10/14: Patient reports she's had about 1 month of pain in low back more to left side at waist radiating into left hip. Worse with sitting. Has standup desk at work which helps. Pain level up to 7/10 and sharp. Did home exercises and physical therapy a couple months ago for what sounds like bursitis/IT band. Has been taking tylenol. No skin changes, numbness.  10/24: Patient reports her pain is persistent at 7/10 level and sharp left side low back. She had toradol injection which helped temporarily. Worse with sitting and lying down. Getting in and out of car bothers her also. No numbness. No skin changes.  Past Medical History:  Diagnosis Date  . Anxiety   . Atrial fibrillation ([place])    a. occuring in 02/2016  . B12 deficiency   . Constipation   . Foreign body in foot, left 07/2013  . GERD (gastroesophageal reflux disease)   . Hematuria   . Irregular menses    due to oral contraceptive  . Palpitations    a. 01/2017: Event monitor showing SR with PVC's.   . Sinus tachycardia   . Thyroid nodule   . Vitamin D deficiency     Current Outpatient Medications on File Prior to Visit  Medication Sig Dispense Refill  . AMBULATORY NON FORMULARY MEDICATION salonpass patches with lidocaine may change every 8 hours as needed for pain. 90 patch 0  . AMBULATORY NON FORMULARY MEDICATION Take 5 mg by mouth daily. Release tablets    . cycloSPORINE (RESTASIS MULTIDOSE) 0.05 % ophthalmic emulsion Place 1 drop into both eyes 2 (two) times daily. (Patient taking differently: Place 1 drop into both eyes 3 times/day as needed-between meals & bedtime. ) 0.4 mL 2  . desogestrel-ethinyl estradiol (APRI,EMOQUETTE,SOLIA) 0.15-30 MG-MCG tablet Take 1 tablet by mouth daily. 3 Package 4  . dimenhyDRINATE (DRAMAMINE) 50 MG tablet Take 1 tablet (50 mg total) by mouth every 8 (eight) hours as needed. 30  tablet 0  . levocetirizine (XYZAL) 5 MG tablet Take 1 tablet (5 mg total) by mouth every evening. 90 tablet 1  . nebivolol (BYSTOLIC) 2.5 MG tablet Take 1 tablet (2.5 mg total) by mouth daily as needed (palpatations). 90 tablet 4  . SUMAtriptan (IMITREX) 100 MG tablet Take 1 tablet (100 mg total) by mouth once as needed for up to 1 dose for migraine. May repeat in 2 hours if headache persists or recurs. 10 tablet 1  . valACYclovir (VALTREX) 1000 MG tablet Take 1 tablet (1,000 mg total) by mouth 2 (two) times daily. (Patient taking differently: Take 1,000 mg by mouth as needed. ) 14 tablet 11   No current facility-administered medications on file prior to visit.     Past Surgical History:  Procedure Laterality Date  . CESAREAN SECTION    . FOREIGN BODY REMOVAL Left 08/05/2013   Procedure: LEFT FOOT FOREIGN BODY REMOVAL ADULT;  Surgeon: Wylene Simmer, MD;  Location: [facility];  Service: Orthopedics;  Laterality: Left;  . SHOULDER ARTHROSCOPY Left   . TUBAL LIGATION      Allergies  Allergen Reactions  . Influenza Vaccines Hives and Rash  . Vyvanse [Lisdexamfetamine Dimesylate]     headache  . Wellbutrin [Bupropion] Other (See Comments)    Possible nipple soreness/headaches/not feeling right    Social History   Socioeconomic History  . Marital status: Divorced  Spouse name: Not on file  . Number of children: 3  . Years of education: Not on file  . Highest education level: Not on file  Occupational History  . Occupation: Research scientist (life sciences): [employer]  . Financial resource strain: Not on file  . Food insecurity:    Worry: Not on file    Inability: Not on file  . Transportation needs:    Medical: Not on file    Non-medical: Not on file  Tobacco Use  . Smoking status: Current Every Day Smoker    Years: 6.00    Types: Cigarettes  . Smokeless tobacco: Never Used  . Tobacco comment: 4-5 cigs/day  Substance and Sexual  Activity  . Alcohol use: Yes    Comment: occasionally  . Drug use: No  . Sexual activity: Not on file    Comment: tubal ligation  Lifestyle  . Physical activity:    Days per week: Not on file    Minutes per session: Not on file  . Stress: Not on file  Relationships  . Social connections:    Talks on phone: Not on file    Gets together: Not on file    Attends religious service: Not on file    Active member of club or organization: Not on file    Attends meetings of clubs or organizations: Not on file    Relationship status: Not on file  . Intimate partner violence:    Fear of current or ex partner: Not on file    Emotionally abused: Not on file    Physically abused: Not on file    Forced sexual activity: Not on file  Other Topics Concern  . Not on file  Social History Narrative  . Not on file    Family History  Problem Relation Age of Onset  . Diabetes Mother   . Hypertension Mother   . Kidney disease Mother   . CAD Father   . Breast cancer Neg Hx     BP 122/83   Pulse (!) 106   Ht 5\' 2"  (1.575 m)   Wt 175 lb (79.4 kg)   BMI 32.01 kg/m   Review of Systems: See HPI above.     Objective:  Physical Exam:  Gen: NAD, comfortable in exam room  Back: No gross deformity, scoliosis. TTP left SI joint, less paraspinal lumbar regions.  No midline or bony TTP. FROM. Strength LEs 5/5 all muscle groups.   2+ MSRs in patellar and achilles tendons, equal bilaterally. Negative SLRs. Negative logroll bilateral hips Negative fabers and piriformis stretches.   Assessment & Plan:  1. Low back/hip pain - 2/2 SI joint dysfunction.  Discussed options as her pain continues - she will start prednisone dose pack, continue HEP.  She will also consider chiropractic care - given info for [facility].  Consider ultrasound guided injection.

## 2018-10-12 NOTE — Progress Notes (Addendum)
Patient presents to clinic today for a Toradol injection. Patient spoke to PCP prior to injection. Patient has been having lower back pain for the past few weeks now, she is seeing ortho as well. Toradol 60mg  injection given in [facility]OQ without complications. No further questions or concerns at this time.   Agree with plan. Pt aware this is only for temporary relief. Needs to continue to follow up closely with ortho/sports medicine. Jade Breeback PA-C  Addendum:  Pt has SI joint dysfunction and think she would benefit from lumbar back brace and tens unit to use 30 minutes 2-3 times a day. She has oral NSAIDs. She is getting massages. She should consider PT as well and also SI joint infection in the future.

## 2018-10-19 ENCOUNTER — Ambulatory Visit (INDEPENDENT_AMBULATORY_CARE_PROVIDER_SITE_OTHER): Payer: 59 | Admitting: Obstetrics & Gynecology

## 2018-10-19 ENCOUNTER — Encounter: Payer: Self-pay | Admitting: Obstetrics & Gynecology

## 2018-10-19 ENCOUNTER — Ambulatory Visit: Payer: Self-pay | Admitting: Sports Medicine

## 2018-10-19 VITALS — BP 106/68 | HR 98 | Resp 16 | Ht 61.0 in | Wt 174.0 lb

## 2018-10-19 DIAGNOSIS — N898 Other specified noninflammatory disorders of vagina: Secondary | ICD-10-CM

## 2018-10-19 DIAGNOSIS — B9689 Other specified bacterial agents as the cause of diseases classified elsewhere: Secondary | ICD-10-CM | POA: Diagnosis not present

## 2018-10-19 DIAGNOSIS — Z124 Encounter for screening for malignant neoplasm of cervix: Secondary | ICD-10-CM | POA: Diagnosis not present

## 2018-10-19 DIAGNOSIS — Z1151 Encounter for screening for human papillomavirus (HPV): Secondary | ICD-10-CM | POA: Diagnosis not present

## 2018-10-19 DIAGNOSIS — N760 Acute vaginitis: Secondary | ICD-10-CM

## 2018-10-19 DIAGNOSIS — Z113 Encounter for screening for infections with a predominantly sexual mode of transmission: Secondary | ICD-10-CM | POA: Diagnosis not present

## 2018-10-19 DIAGNOSIS — Z01419 Encounter for gynecological examination (general) (routine) without abnormal findings: Secondary | ICD-10-CM

## 2018-10-19 MED ORDER — FLUCONAZOLE 150 MG PO TABS: 150.0000 mg | ORAL_TABLET | Freq: Once | ORAL | 3 refills | Status: AC

## 2018-10-19 MED ORDER — TINIDAZOLE 500 MG PO TABS: 2.0000 g | ORAL_TABLET | Freq: Every day | ORAL | 2 refills | Status: DC

## 2018-10-19 MED FILL — FLUCONAZOLE 150 MG TABS: 150 | 1 days supply | Qty: 1 | Fill #0

## 2018-10-19 MED FILL — TINIDAZOLE 500 MG TABS: 500 | 2 days supply | Qty: 8 | Fill #0

## 2018-10-19 NOTE — Patient Instructions (Signed)

## 2018-10-19 NOTE — Progress Notes (Signed)
GYNECOLOGY ANNUAL PREVENTATIVE CARE ENCOUNTER NOTE  Subjective:   Kelsey Vaughan is a 42 y.o. [id number] female here for a routine annual gynecologic exam.  Current complaints: abnormal vaginal discharge for a while, no itching, no pain, no foul-odor.  Thinks it could be related to recent use of fish oil capsules..   Denies abnormal vaginal bleeding, discharge, pelvic pain, problems with intercourse or other gynecologic concerns.    Gynecologic History No LMP recorded. (Menstrual status: Oral contraceptives). Contraception: tubal ligation Last Pap: 09/16/2017. Results were: normal with negative HPV Last mammogram: 02/25/2018. Results were: normal  Obstetric History OB History  Gravida Para Term Preterm AB Living  3 3 3     3   SAB TAB Ectopic Multiple Live Births               # Outcome Date GA Lbr Len/2nd Weight Sex Delivery Anes PTL Lv  3 Term           2 Term           1 Term             Past Medical History:  Diagnosis Date  . Anxiety   . Atrial fibrillation ([place])    a. occuring in 02/2016  . B12 deficiency   . Constipation   . Foreign body in foot, left 07/2013  . GERD (gastroesophageal reflux disease)   . Hematuria   . Irregular menses    due to oral contraceptive  . Palpitations    a. 01/2017: Event monitor showing SR with PVC's.   . Sinus tachycardia   . Thyroid nodule   . Vitamin D deficiency     Past Surgical History:  Procedure Laterality Date  . CESAREAN SECTION    . FOREIGN BODY REMOVAL Left 08/05/2013   Procedure: LEFT FOOT FOREIGN BODY REMOVAL ADULT;  Surgeon: Wylene Simmer, MD;  Location: [facility];  Service: Orthopedics;  Laterality: Left;  . SHOULDER ARTHROSCOPY Left   . TUBAL LIGATION      Current Outpatient Medications on File Prior to Visit  Medication Sig Dispense Refill  . AMBULATORY NON FORMULARY MEDICATION salonpass patches with lidocaine may change every 8 hours as needed for pain. 90 patch 0  . AMBULATORY NON FORMULARY  MEDICATION Take 5 mg by mouth daily. Release tablets    . cycloSPORINE (RESTASIS MULTIDOSE) 0.05 % ophthalmic emulsion Place 1 drop into both eyes 2 (two) times daily. (Patient taking differently: Place 1 drop into both eyes 3 times/day as needed-between meals & bedtime. ) 0.4 mL 2  . desogestrel-ethinyl estradiol (APRI,EMOQUETTE,SOLIA) 0.15-30 MG-MCG tablet Take 1 tablet by mouth daily. 3 Package 4  . diclofenac sodium (VOLTAREN) 1 % GEL Apply 4 g topically 4 (four) times daily. To affected joint. 100 g 11  . dimenhyDRINATE (DRAMAMINE) 50 MG tablet Take 1 tablet (50 mg total) by mouth every 8 (eight) hours as needed. 30 tablet 0  . levocetirizine (XYZAL) 5 MG tablet Take 1 tablet (5 mg total) by mouth every evening. 90 tablet 1  . nebivolol (BYSTOLIC) 2.5 MG tablet Take 1 tablet (2.5 mg total) by mouth daily as needed (palpatations). 90 tablet 4  . predniSONE (DELTASONE) 10 MG tablet 6 tabs po day 1, 5 tabs po day 2, 4 tabs po day 3, 3 tabs po day 4, 2 tabs po day 5, 1 tab po day 6 21 tablet 0  . SUMAtriptan (IMITREX) 100 MG tablet Take 1 tablet (100 mg total) by  mouth once as needed for up to 1 dose for migraine. May repeat in 2 hours if headache persists or recurs. 10 tablet 1  . valACYclovir (VALTREX) 1000 MG tablet Take 1 tablet (1,000 mg total) by mouth 2 (two) times daily. (Patient taking differently: Take 1,000 mg by mouth as needed. ) 14 tablet 11   No current facility-administered medications on file prior to visit.     Allergies  Allergen Reactions  . Influenza Vaccines Hives and Rash  . Vyvanse [Lisdexamfetamine Dimesylate]     headache  . Wellbutrin [Bupropion] Other (See Comments)    Possible nipple soreness/headaches/not feeling right    Social History:  reports that she has been smoking cigarettes. She has smoked for the past 6.00 years. She has never used smokeless tobacco. She reports that she drinks alcohol. She reports that she does not use drugs.  Family History    Problem Relation Age of Onset  . Diabetes Mother   . Hypertension Mother   . Kidney disease Mother   . CAD Father   . Breast cancer Neg Hx     The following portions of the patient's history were reviewed and updated as appropriate: allergies, current medications, past family history, past medical history, past social history, past surgical history and problem list.  Review of Systems Pertinent items noted in HPI and remainder of comprehensive ROS otherwise negative.   Objective:  BP 106/68   Pulse 98   Resp 16   Ht 5\' 1"  (1.549 m)   Wt 174 lb (78.9 kg)   BMI 32.88 kg/m  CONSTITUTIONAL: Well-developed, well-nourished female in no acute distress.  HENT:  Normocephalic, atraumatic, External right and left ear normal. Oropharynx is clear and moist EYES: Conjunctivae and EOM are normal. Pupils are equal, round, and reactive to light. No scleral icterus.  NECK: Normal range of motion, supple, no masses.  Normal thyroid.  SKIN: Skin is warm and dry. No rash noted. Not diaphoretic. No erythema. No pallor. MUSCULOSKELETAL: Normal range of motion. No tenderness.  No cyanosis, clubbing, or edema.  2+ distal pulses. NEUROLOGIC: Alert and oriented to person, place, and time. Normal reflexes, muscle tone coordination. No cranial nerve deficit noted. PSYCHIATRIC: Normal mood and affect. Normal behavior. Normal judgment and thought content. CARDIOVASCULAR: Normal heart rate noted, regular rhythm RESPIRATORY: Clear to auscultation bilaterally. Effort and breath sounds normal, no problems with respiration noted. BREASTS: Symmetric in size. No masses, skin changes, nipple drainage, or lymphadenopathy. ABDOMEN: Soft, normal bowel sounds, no distention noted.  Mild mid abdominal tenderness, no lower abdominal tenderness, no rebound or guarding.  PELVIC: Normal appearing external genitalia; normal appearing vaginal mucosa and cervix.  Yellow-white vaginal discharge noted, testing sample obtained.  Pap  smear obtained.  Normal uterine size, no other palpable masses, no uterine or adnexal tenderness.    Assessment and Plan:  1. Vaginal discharge Possibly BV. Antibiotics ordered, she desired Diflucan given history of rebound yeast vaginitis. Proper vulvar hygiene emphasized: discussed avoidance of perfumed soaps, detergents, lotions and any type of douches; in addition to wearing cotton underwear and no underwear at night.  Also recommended cleaning front to back, voiding and cleaning up after intercourse.  - Cervicovaginal ancillary only - fluconazole (DIFLUCAN) 150 MG tablet; Take 1 tablet (150 mg total) by mouth once for 1 dose. Can take additional dose three days later if symptoms persist  Dispense: 1 tablet; Refill: 3 - tinidazole (TINDAMAX) 500 MG tablet; Take 4 tablets (2,000 mg total) by mouth daily with  breakfast. For two days  Dispense: 8 tablet; Refill: 2  2. Well woman exam with routine gynecological exam - Cytology - PAP Will follow up results of pap smear and manage accordingly. Mammogram is up to date Routine preventative health maintenance measures emphasized. Please refer to After Visit Summary for other counseling recommendations.    Verita Schneiders, MD, [place] for [employer], [place]

## 2018-10-20 LAB — CERVICOVAGINAL ANCILLARY ONLY
BACTERIAL VAGINITIS: POSITIVE — AB
Candida vaginitis: NEGATIVE
Chlamydia: NEGATIVE
NEISSERIA GONORRHEA: NEGATIVE
Trichomonas: NEGATIVE

## 2018-10-22 ENCOUNTER — Telehealth: Payer: Self-pay

## 2018-10-22 DIAGNOSIS — N760 Acute vaginitis: Principal | ICD-10-CM

## 2018-10-22 DIAGNOSIS — B9689 Other specified bacterial agents as the cause of diseases classified elsewhere: Secondary | ICD-10-CM

## 2018-10-22 LAB — CYTOLOGY - PAP
Diagnosis: NEGATIVE
HPV: NOT DETECTED

## 2018-10-22 MED FILL — DICLOFENAC SODIUM 1% GEL: 1 | 7 days supply | Qty: 100 | Fill #0

## 2018-10-22 NOTE — Telephone Encounter (Signed)
error 

## 2018-10-26 ENCOUNTER — Encounter: Payer: Self-pay | Admitting: Physician Assistant

## 2018-10-26 MED ORDER — BORIC ACID CRYS: CRYSTALS | 0 refills | Status: DC

## 2018-10-30 ENCOUNTER — Ambulatory Visit (INDEPENDENT_AMBULATORY_CARE_PROVIDER_SITE_OTHER): Payer: 59 | Admitting: Physician Assistant

## 2018-10-30 VITALS — BP 123/83 | HR 111

## 2018-10-30 DIAGNOSIS — E538 Deficiency of other specified B group vitamins: Secondary | ICD-10-CM

## 2018-10-30 MED ORDER — CYANOCOBALAMIN 1000 MCG/ML IJ SOLN
1000.0000 ug | Freq: Once | INTRAMUSCULAR | Status: AC
  Administered 2018-10-30: 1000 ug via INTRAMUSCULAR

## 2018-10-30 NOTE — Progress Notes (Signed)
Pt here for B12 injection. After last blood work she was instructed to get injections every 2 weeks then recheck in 3 months. Pt reports no negative side effects. Denies any chest pain. Tolerated injection in RUOQ well, no immediate complications.   Agree with above plan. Iran Planas PA-C

## 2018-11-02 ENCOUNTER — Ambulatory Visit: Payer: 59 | Admitting: Family Medicine

## 2018-11-04 ENCOUNTER — Telehealth: Payer: Self-pay

## 2018-11-04 NOTE — Telephone Encounter (Signed)
Larene Beach from [facility] who provides back braces and tens units for patients, called requesting additional information.   In order for insurance to cover back brace, an addendum needs to be added to last OV stating the recommendation and need for back brace.   Once this is added, note needs to be faxed to [phone].

## 2018-11-10 NOTE — Telephone Encounter (Signed)
Done  Please fax

## 2018-11-10 NOTE — Telephone Encounter (Signed)
Faxed. °Confirmation received °

## 2018-11-12 DIAGNOSIS — M545 Low back pain: Secondary | ICD-10-CM | POA: Diagnosis not present

## 2018-11-20 ENCOUNTER — Ambulatory Visit (INDEPENDENT_AMBULATORY_CARE_PROVIDER_SITE_OTHER): Payer: 59 | Admitting: Physician Assistant

## 2018-11-20 ENCOUNTER — Encounter: Payer: Self-pay | Admitting: Physician Assistant

## 2018-11-20 DIAGNOSIS — E538 Deficiency of other specified B group vitamins: Secondary | ICD-10-CM | POA: Diagnosis not present

## 2018-11-20 MED ORDER — CYANOCOBALAMIN 1000 MCG/ML IJ SOLN
1000.0000 ug | Freq: Once | INTRAMUSCULAR | Status: AC
  Administered 2018-11-20: 1000 ug via INTRAMUSCULAR

## 2018-11-20 NOTE — Progress Notes (Signed)
Patient presents to clinic today for monthly vitamin B12 shot. Shot given in [place] today and was tolerated well without complications. Patient advised to make another appointment next month for next B12 shot. No further questions or concerns at this time. Casilda Carls, CMA.   Agree with above plan. Iran Planas PA-C

## 2018-12-04 ENCOUNTER — Ambulatory Visit: Payer: 59 | Admitting: Family Medicine

## 2018-12-04 ENCOUNTER — Ambulatory Visit (INDEPENDENT_AMBULATORY_CARE_PROVIDER_SITE_OTHER): Payer: 59

## 2018-12-04 ENCOUNTER — Other Ambulatory Visit: Payer: Self-pay

## 2018-12-04 DIAGNOSIS — M5416 Radiculopathy, lumbar region: Secondary | ICD-10-CM

## 2018-12-04 DIAGNOSIS — M545 Low back pain, unspecified: Secondary | ICD-10-CM

## 2018-12-04 NOTE — Progress Notes (Signed)
Call pt: spine alignment looks great. Disc space looks really good. Overall xray looks really good.

## 2018-12-04 NOTE — Progress Notes (Unsigned)
X ray

## 2018-12-07 ENCOUNTER — Encounter: Payer: Self-pay | Admitting: Physician Assistant

## 2018-12-07 ENCOUNTER — Ambulatory Visit (INDEPENDENT_AMBULATORY_CARE_PROVIDER_SITE_OTHER): Payer: 59 | Admitting: Physician Assistant

## 2018-12-07 VITALS — BP 113/72 | HR 92 | Temp 98.1°F | Wt 173.9 lb

## 2018-12-07 DIAGNOSIS — J0100 Acute maxillary sinusitis, unspecified: Secondary | ICD-10-CM

## 2018-12-07 MED ORDER — AZITHROMYCIN 250 MG PO TABS: ORAL_TABLET | ORAL | 0 refills | Status: DC

## 2018-12-07 MED FILL — AZITHROMYCIN 250 MG TABLET: 250 | 5 days supply | Qty: 6 | Fill #0

## 2018-12-07 MED FILL — DICLOFENAC SODIUM 1 % GEL: 1 | 7 days supply | Qty: 100 | Fill #1

## 2018-12-09 ENCOUNTER — Encounter: Payer: Self-pay | Admitting: Physician Assistant
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
## 2018-12-17 ENCOUNTER — Ambulatory Visit (INDEPENDENT_AMBULATORY_CARE_PROVIDER_SITE_OTHER): Payer: 59 | Admitting: Sports Medicine

## 2018-12-17 ENCOUNTER — Encounter: Payer: Self-pay | Admitting: Sports Medicine

## 2018-12-17 DIAGNOSIS — M5136 Other intervertebral disc degeneration, lumbar region: Secondary | ICD-10-CM | POA: Diagnosis not present

## 2018-12-17 DIAGNOSIS — M47816 Spondylosis without myelopathy or radiculopathy, lumbar region: Secondary | ICD-10-CM | POA: Insufficient documentation

## 2018-12-17 DIAGNOSIS — M51369 Other intervertebral disc degeneration, lumbar region without mention of lumbar back pain or lower extremity pain: Secondary | ICD-10-CM

## 2018-12-17 NOTE — Progress Notes (Signed)
Subjective:    CC: Low back pain  HPI: This is a pleasant 43 year old female, for the past 6 months she has had increasing pain in her low back, left-sided, radiation into the buttock but not past the knee.  Worse with sitting, flexion, Valsalva.  No bowel or bladder dysfunction, saddle numbness, no constitutional symptoms, and she did have x-rays that were normal.  NSAIDs are contraindicated due to a renal issue, she has had physical therapy per her report without any improvement in her pain.  Has never had an MRI.  I reviewed the past medical history, family history, social history, surgical history, and allergies today and no changes were needed.  Please see the problem list section below in epic for further details.  Past Medical History: Past Medical History:  Diagnosis Date  . Anxiety   . Atrial fibrillation ([place])    a. occuring in 02/2016  . B12 deficiency   . Constipation   . Foreign body in foot, left 07/2013  . GERD (gastroesophageal reflux disease)   . Hematuria   . Irregular menses    due to oral contraceptive  . Palpitations    a. 01/2017: Event monitor showing SR with PVC's.   . Sinus tachycardia   . Thyroid nodule   . Vitamin D deficiency    Past Surgical History: Past Surgical History:  Procedure Laterality Date  . CESAREAN SECTION    . FOREIGN BODY REMOVAL Left 08/05/2013   Procedure: LEFT FOOT FOREIGN BODY REMOVAL ADULT;  Surgeon: Wylene Simmer, MD;  Location: [facility];  Service: Orthopedics;  Laterality: Left;  . SHOULDER ARTHROSCOPY Left   . TUBAL LIGATION     Social History: Social History   Socioeconomic History  . Marital status: Divorced    Spouse name: Not on file  . Number of children: 3  . Years of education: Not on file  . Highest education level: Not on file  Occupational History  . Occupation: Research scientist (life sciences): [employer]  . Financial resource strain: Not on file  . Food insecurity:      Worry: Not on file    Inability: Not on file  . Transportation needs:    Medical: Not on file    Non-medical: Not on file  Tobacco Use  . Smoking status: Current Every Day Smoker    Years: 6.00    Types: Cigarettes  . Smokeless tobacco: Never Used  . Tobacco comment: 4-5 cigs/day  Substance and Sexual Activity  . Alcohol use: Yes    Comment: occasionally  . Drug use: No  . Sexual activity: Not on file    Comment: tubal ligation  Lifestyle  . Physical activity:    Days per week: Not on file    Minutes per session: Not on file  . Stress: Not on file  Relationships  . Social connections:    Talks on phone: Not on file    Gets together: Not on file    Attends religious service: Not on file    Active member of club or organization: Not on file    Attends meetings of clubs or organizations: Not on file    Relationship status: Not on file  Other Topics Concern  . Not on file  Social History Narrative  . Not on file   Family History: Family History  Problem Relation Age of Onset  . Diabetes Mother   . Hypertension Mother   . Kidney disease  Mother   . CAD Father   . Breast cancer Neg Hx    Allergies: Allergies  Allergen Reactions  . Influenza Vaccines Hives and Rash  . Vyvanse [Lisdexamfetamine Dimesylate]     headache  . Wellbutrin [Bupropion] Other (See Comments)    Possible nipple soreness/headaches/not feeling right   Medications: See med rec.  Review of Systems: No fevers, chills, night sweats, weight loss, chest pain, or shortness of breath.   Objective:    General: Well Developed, well nourished, and in no acute distress.  Neuro: Alert and oriented x3, extra-ocular muscles intact, sensation grossly intact.  HEENT: Normocephalic, atraumatic, pupils equal round reactive to light, neck supple, no masses, no lymphadenopathy, thyroid nonpalpable.  Skin: Warm and dry, no rashes. Cardiac: Regular rate and rhythm, no murmurs rubs or gallops, no lower extremity  edema.  Respiratory: Clear to auscultation bilaterally. Not using accessory muscles, speaking in full sentences. Back Exam:  Inspection: Unremarkable  Motion: Flexion 45 deg, Extension 45 deg, Side Bending to 45 deg bilaterally,  Rotation to 45 deg bilaterally  SLR laying: On the left side reproduces back pain but nothing radicular. XSLR laying: Negative  Palpable tenderness: Left paralumbar musculature.[name] Kitchen FABER: negative. Sensory change: Gross sensation intact to all lumbar and sacral dermatomes.  Reflexes: 2+ at both patellar tendons, 2+ at achilles tendons, Babinski's downgoing.  Strength at foot  Plantar-flexion: 5/5 Dorsi-flexion: 5/5 Eversion: 5/5 Inversion: 5/5  Leg strength  Quad: 5/5 Hamstring: 5/5 Hip flexor: 5/5 Hip abductors: 5/5  Gait unremarkable.  CT of the abdomen and pelvis from 2017 reviewed, she does have L5-S1 DDD without central or foraminal stenosis, facet joints look good, SI joints look good.  Impression and Recommendations:    DDD (degenerative disc disease), lumbar Axial discogenic back pain, left-sided without overt radiculopathy. Nothing to really suggest SI joint mediated pain, pain is worse with sitting, flexion, Valsalva. Work restrictions given, x-rays overall unremarkable. NSAIDs contraindicated in this patient. I did review a CT from 2017 that showed lumbar DDD at L5-S1 without central or foraminal stenosis. Pain is been present for over 6 months, we are going to proceed with an MRI for epidural planning, my suspicion is left L5-S1 interlaminar. ___________________________________________ Gwen Her. Dianah Field, M.D., ABFM., CAQSM. Primary Care and Sports Medicine [facility]  Adjunct Professor of [employer] [facility] [employer]

## 2018-12-17 NOTE — Assessment & Plan Note (Signed)
Axial discogenic back pain, left-sided without overt radiculopathy. Nothing to really suggest SI joint mediated pain, pain is worse with sitting, flexion, Valsalva. Work restrictions given, x-rays overall unremarkable. NSAIDs contraindicated in this patient. I did review a CT from 2017 that showed lumbar DDD at L5-S1 without central or foraminal stenosis. Pain is been present for over 6 months, we are going to proceed with an MRI for epidural planning, my suspicion is left L5-S1 interlaminar.

## 2018-12-18 ENCOUNTER — Encounter: Payer: Self-pay | Admitting: Sports Medicine

## 2018-12-21 ENCOUNTER — Ambulatory Visit (INDEPENDENT_AMBULATORY_CARE_PROVIDER_SITE_OTHER): Payer: 59

## 2018-12-21 DIAGNOSIS — M47817 Spondylosis without myelopathy or radiculopathy, lumbosacral region: Secondary | ICD-10-CM | POA: Diagnosis not present

## 2018-12-21 DIAGNOSIS — M5136 Other intervertebral disc degeneration, lumbar region: Secondary | ICD-10-CM

## 2018-12-21 DIAGNOSIS — M51369 Other intervertebral disc degeneration, lumbar region without mention of lumbar back pain or lower extremity pain: Secondary | ICD-10-CM

## 2018-12-23 ENCOUNTER — Ambulatory Visit: Payer: Self-pay | Admitting: Sports Medicine

## 2018-12-28 ENCOUNTER — Ambulatory Visit (INDEPENDENT_AMBULATORY_CARE_PROVIDER_SITE_OTHER): Payer: 59 | Admitting: Physician Assistant

## 2018-12-28 ENCOUNTER — Encounter: Payer: Self-pay | Admitting: Family Medicine

## 2018-12-28 ENCOUNTER — Ambulatory Visit (INDEPENDENT_AMBULATORY_CARE_PROVIDER_SITE_OTHER): Payer: 59 | Admitting: Family Medicine

## 2018-12-28 VITALS — BP 115/72 | HR 99

## 2018-12-28 VITALS — BP 118/80 | HR 100 | Ht 62.0 in

## 2018-12-28 DIAGNOSIS — E538 Deficiency of other specified B group vitamins: Secondary | ICD-10-CM | POA: Diagnosis not present

## 2018-12-28 DIAGNOSIS — Z3009 Encounter for other general counseling and advice on contraception: Secondary | ICD-10-CM | POA: Diagnosis not present

## 2018-12-28 DIAGNOSIS — M533 Sacrococcygeal disorders, not elsewhere classified: Secondary | ICD-10-CM

## 2018-12-28 MED ORDER — CYANOCOBALAMIN 1000 MCG/ML IJ SOLN
1000.0000 ug | Freq: Once | INTRAMUSCULAR | Status: AC
  Administered 2018-12-28: 1000 ug via INTRAMUSCULAR

## 2018-12-28 NOTE — Progress Notes (Signed)
Patient is here today for a vitamin B12 shot. Shot was given in the RUOQ today without complications. Patient is due for lab work, and will have labs done soon. No further questions or concerns at this time.   Agree with above plan. Jade breeback PA-C

## 2018-12-28 NOTE — Patient Instructions (Signed)
Your MRI looks normal - the bulging discs, mild facet arthropathy are expected when we get to around age 43-40 and they aren't pinching a nerve. Start physical therapy for SI joint dysfunction and IT band syndrome. Call me if you're not improving and we can go ahead with injection. Massage is ok to do as well though data doesn't support long term benefit with this. Topical aspercreme, capsaicin, or biofreeze are considerations if the voltaren gel isn't helping enough. Check with your kidney doctor to see if the concern about anti-inflammatories was a permanent concern or a temporary one because your creatinine was up that time. Follow up with me about 4-5 weeks after starting therapy.

## 2018-12-29 ENCOUNTER — Encounter: Payer: Self-pay | Admitting: Family Medicine

## 2018-12-29 NOTE — Progress Notes (Signed)
PCP: Donella Stade, PA-C  Subjective:   HPI: Patient is a 43 y.o. female here for low back pain.  10/14: Patient reports she's had about 1 month of pain in low back more to left side at waist radiating into left hip. Worse with sitting. Has standup desk at work which helps. Pain level up to 7/10 and sharp. Did home exercises and physical therapy a couple months ago for what sounds like bursitis/IT band. Has been taking tylenol. No skin changes, numbness.  10/24: Patient reports her pain is persistent at 7/10 level and sharp left side low back. She had toradol injection which helped temporarily. Worse with sitting and lying down. Getting in and out of car bothers her also. No numbness. No skin changes.  12/28/18: Patient reports she continues to struggle with pain in the left side of her low back radiating into her left leg. Pain level 2 out of 10 but gets up to 10 out of 10 at times and sharp. She does have a constant pain that is difficult getting out of a car. She is tried diclofenac gel which helps for about an hour to an hour and a half. She had an MRI done of her lumbar spine from January 6 which show mild degenerative changes at L5-S1 and L4-5 but no evidence of foraminal narrowing or nerve impingement. No bowel or bladder dysfunction.  Past Medical History:  Diagnosis Date  . Anxiety   . Atrial fibrillation ([place])    a. occuring in 02/2016  . B12 deficiency   . Constipation   . Foreign body in foot, left 07/2013  . GERD (gastroesophageal reflux disease)   . Hematuria   . Irregular menses    due to oral contraceptive  . Palpitations    a. 01/2017: Event monitor showing SR with PVC's.   . Sinus tachycardia   . Thyroid nodule   . Vitamin D deficiency     Current Outpatient Medications on File Prior to Visit  Medication Sig Dispense Refill  . AMBULATORY NON FORMULARY MEDICATION salonpass patches with lidocaine may change every 8 hours as needed for pain. 90  patch 0  . AMBULATORY NON FORMULARY MEDICATION Take 5 mg by mouth daily. Release tablets    . Boric Acid CRYS 600mg  pv twice weekly for BV prevention. 125 g 0  . cycloSPORINE (RESTASIS MULTIDOSE) 0.05 % ophthalmic emulsion Place 1 drop into both eyes 2 (two) times daily. (Patient taking differently: Place 1 drop into both eyes 3 times/day as needed-between meals & bedtime. ) 0.4 mL 2  . desogestrel-ethinyl estradiol (APRI,EMOQUETTE,SOLIA) 0.15-30 MG-MCG tablet Take 1 tablet by mouth daily. 3 Package 4  . diclofenac sodium (VOLTAREN) 1 % GEL Apply 4 g topically 4 (four) times daily. To affected joint. 100 g 11  . dimenhyDRINATE (DRAMAMINE) 50 MG tablet Take 1 tablet (50 mg total) by mouth every 8 (eight) hours as needed. 30 tablet 0  . levocetirizine (XYZAL) 5 MG tablet Take 1 tablet (5 mg total) by mouth every evening. 90 tablet 1  . nebivolol (BYSTOLIC) 2.5 MG tablet Take 1 tablet (2.5 mg total) by mouth daily as needed (palpatations). 90 tablet 4  . SUMAtriptan (IMITREX) 100 MG tablet Take 1 tablet (100 mg total) by mouth once as needed for up to 1 dose for migraine. May repeat in 2 hours if headache persists or recurs. 10 tablet 1  . tinidazole (TINDAMAX) 500 MG tablet Take 4 tablets (2,000 mg total) by mouth daily with breakfast.  For two days 8 tablet 2  . valACYclovir (VALTREX) 1000 MG tablet Take 1 tablet (1,000 mg total) by mouth 2 (two) times daily. (Patient taking differently: Take 1,000 mg by mouth as needed. ) 14 tablet 11   No current facility-administered medications on file prior to visit.     Past Surgical History:  Procedure Laterality Date  . CESAREAN SECTION    . FOREIGN BODY REMOVAL Left 08/05/2013   Procedure: LEFT FOOT FOREIGN BODY REMOVAL ADULT;  Surgeon: Wylene Simmer, MD;  Location: [facility];  Service: Orthopedics;  Laterality: Left;  . SHOULDER ARTHROSCOPY Left   . TUBAL LIGATION      Allergies  Allergen Reactions  . Influenza Vaccines Hives and Rash   . Vyvanse [Lisdexamfetamine Dimesylate]     headache  . Wellbutrin [Bupropion] Other (See Comments)    Possible nipple soreness/headaches/not feeling right    Social History   Socioeconomic History  . Marital status: Divorced    Spouse name: Not on file  . Number of children: 3  . Years of education: Not on file  . Highest education level: Not on file  Occupational History  . Occupation: Research scientist (life sciences): [employer]  . Financial resource strain: Not on file  . Food insecurity:    Worry: Not on file    Inability: Not on file  . Transportation needs:    Medical: Not on file    Non-medical: Not on file  Tobacco Use  . Smoking status: Current Every Day Smoker    Years: 6.00    Types: Cigarettes  . Smokeless tobacco: Never Used  . Tobacco comment: 4-5 cigs/day  Substance and Sexual Activity  . Alcohol use: Yes    Comment: occasionally  . Drug use: No  . Sexual activity: Not on file    Comment: tubal ligation  Lifestyle  . Physical activity:    Days per week: Not on file    Minutes per session: Not on file  . Stress: Not on file  Relationships  . Social connections:    Talks on phone: Not on file    Gets together: Not on file    Attends religious service: Not on file    Active member of club or organization: Not on file    Attends meetings of clubs or organizations: Not on file    Relationship status: Not on file  . Intimate partner violence:    Fear of current or ex partner: Not on file    Emotionally abused: Not on file    Physically abused: Not on file    Forced sexual activity: Not on file  Other Topics Concern  . Not on file  Social History Narrative  . Not on file    Family History  Problem Relation Age of Onset  . Diabetes Mother   . Hypertension Mother   . Kidney disease Mother   . CAD Father   . Breast cancer Neg Hx     BP 118/80   Pulse 100   Ht 5\' 2"  (1.575 m)   LMP  (LMP Unknown)   BMI 31.28 kg/m    Review of Systems: See HPI above.     Objective:  Physical Exam:  Gen: NAD, comfortable in exam room  Back: No gross deformity, scoliosis. TTP left SI joint, less paraspinal lumbar regions.  No midline or bony TTP. FROM. Strength LEs 5/5 all muscle groups.   Negative SLRs. Sensation  intact to light touch bilaterally.  Left hip: No deformity. FROM with 5/5 strength. TTP left SI joint, less proximal IT band.  No other tenderness. NVI distally. Negative logroll bilateral hips Mild pain with fabers.  Negative fadir and piriformis stretch.   Assessment & Plan:  1. Low back/hip pain -independently reviewed MRI and discussed this with the patient showing very mild degenerative changes at two levels but nothing to account for her pain.  Advised this is the level of arthritis I would expect for her age.  We discussed options for SI joint dysfunction and mild IT band syndrome.  She would like to start with physical therapy.  She will do topical medications to include Voltaren gel.  She was advised by a nephrologist previously not to take NSAIDs and she will check on this to see if it was because of her creatinine at one point or permanent restriction for her.  She will consider SI joint injection if not improving.  Follow-up in 4 to 5 weeks after starting therapy.

## 2019-01-01 ENCOUNTER — Ambulatory Visit: Payer: Self-pay | Admitting: Physician Assistant

## 2019-01-04 ENCOUNTER — Ambulatory Visit: Payer: Self-pay | Admitting: Rehabilitative and Restorative Service Providers"

## 2019-01-06 ENCOUNTER — Ambulatory Visit (INDEPENDENT_AMBULATORY_CARE_PROVIDER_SITE_OTHER): Payer: 59 | Admitting: Rehabilitative and Restorative Service Providers"

## 2019-01-06 ENCOUNTER — Other Ambulatory Visit: Payer: Self-pay

## 2019-01-06 ENCOUNTER — Encounter: Payer: Self-pay | Admitting: Rehabilitative and Restorative Service Providers"

## 2019-01-06 DIAGNOSIS — M25552 Pain in left hip: Secondary | ICD-10-CM | POA: Diagnosis not present

## 2019-01-06 DIAGNOSIS — R29898 Other symptoms and signs involving the musculoskeletal system: Secondary | ICD-10-CM

## 2019-01-06 DIAGNOSIS — R293 Abnormal posture: Secondary | ICD-10-CM

## 2019-01-06 DIAGNOSIS — M5442 Lumbago with sciatica, left side: Secondary | ICD-10-CM

## 2019-01-06 DIAGNOSIS — G8929 Other chronic pain: Secondary | ICD-10-CM | POA: Diagnosis not present

## 2019-01-06 NOTE — Patient Instructions (Signed)
Access Code: [email or web address]  URL: [email or web address]  Date: 01/06/2019  Prepared by: Gillermo Murdoch   Exercises  Supine Hamstring Stretch with Strap - 10 reps - 1 sets - 30 seconds hold - 2x daily - 7x weekly  Supine ITB Stretch with Strap - 3 reps - 1 sets - 30 seconds hold - 2x daily - 7x weekly  Supine Piriformis Stretch with Leg Straight - 3 reps - 1 sets - 30 seconds hold - 2x daily - 7x weekly  Prone Quadriceps Stretch with Strap - 3 reps - 1 sets - 30 seconds hold - 2x daily - 7x weekly  Patient Education  Trigger Point Dry Needling
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
## 2019-01-07 ENCOUNTER — Ambulatory Visit (INDEPENDENT_AMBULATORY_CARE_PROVIDER_SITE_OTHER): Payer: 59 | Admitting: Physician Assistant

## 2019-01-07 ENCOUNTER — Encounter: Payer: Self-pay | Admitting: Physician Assistant

## 2019-01-07 VITALS — BP 118/76 | HR 94 | Ht 62.0 in | Wt 175.0 lb

## 2019-01-07 DIAGNOSIS — F17200 Nicotine dependence, unspecified, uncomplicated: Secondary | ICD-10-CM | POA: Diagnosis not present

## 2019-01-07 DIAGNOSIS — R202 Paresthesia of skin: Secondary | ICD-10-CM | POA: Diagnosis not present

## 2019-01-07 DIAGNOSIS — R252 Cramp and spasm: Secondary | ICD-10-CM

## 2019-01-07 DIAGNOSIS — R209 Unspecified disturbances of skin sensation: Secondary | ICD-10-CM

## 2019-01-07 NOTE — Progress Notes (Signed)
 Subjective:    Patient ID: Kelsey Vaughan, female    DOB: 05/26/1976, 42 y.o.   MRN: [id number]  HPI Pt is a 42 yo female who presents to the clinic to follow up on bilateral cold feet. She reports having this issue for many years.  Her symptoms are intermittent and seem to take a 6 months to year improvement until recently worsened.  She was worked up with ABI in 2018 that was normal.  Only symptomatic intervention was encouraged.  She does report some numbness and tingling that accompanied the cold feet.  She also reports some intermittent cramping of calves and feet when walking and at rest.  She does continue to smoke about a pack and a half a week.   Review of Systems  All other systems reviewed and are negative.      Objective:   Physical Exam Vitals signs reviewed.  Constitutional:      Appearance: Normal appearance.  HENT:     Head: Normocephalic and atraumatic.  Cardiovascular:     Rate and Rhythm: Normal rate.  Skin:    Comments: Cold feet to palpation.  Good pedal pulses bilaterally.   Neurological:     General: No focal deficit present.     Mental Status: She is alert and oriented to person, place, and time.  Psychiatric:        Mood and Affect: Mood normal.        Behavior: Behavior normal.           Assessment & Plan:  ..Kelsey Vaughan was seen today for cold feet.  Diagnoses and all orders for this visit:  Paresthesia -     Cancel: B12 -     Cancel: CBC with Differential/Platelet -     Cancel: Ferritin -     Cancel: BASIC METABOLIC PANEL WITH GFR -     Cancel: Magnesium -     B12 and Folate Panel -     BMP8+EGFR -     CBC with Differential -     Ferritin -     Magnesium -     VAS US ABI WITH/WO TBI; Future -     NCV with EMG(electromyography)  Bilateral cold feet -     Cancel: B12 -     Cancel: CBC with Differential/Platelet -     Cancel: Ferritin -     Cancel: BASIC METABOLIC PANEL WITH GFR -     Cancel: Magnesium -     B12 and Folate  Panel -     BMP8+EGFR -     CBC with Differential -     Ferritin -     Magnesium -     VAS US ABI WITH/WO TBI; Future -     NCV with EMG(electromyography)  Cramping of feet -     Cancel: B12 -     Cancel: CBC with Differential/Platelet -     Cancel: Ferritin -     Cancel: BASIC METABOLIC PANEL WITH GFR -     Cancel: Magnesium -     B12 and Folate Panel -     BMP8+EGFR -     CBC with Differential -     Ferritin -     Magnesium -     VAS US ABI WITH/WO TBI; Future -     NCV with EMG(electromyography)  Current smoker -     VAS US ABI WITH/WO TBI; Future   Will repeat ABI and get EMG to   look for PVD and/or neuropathy. labs ordered.  Suspect this is raynauds syndrome. Discussed treatment plan. Consider CCB. Follow up after imaging.   On b12 shots will monitor b12.  

## 2019-01-07 NOTE — Progress Notes (Signed)
l °

## 2019-01-07 NOTE — Patient Instructions (Signed)
Raynaud Phenomenon    Raynaud phenomenon is a condition that affects the blood vessels (arteries) that carry blood to your fingers and toes. The arteries that supply blood to your ears, lips, nipples, or the tip of your nose might also be affected. Raynaud phenomenon causes the arteries to become narrow temporarily (spasm). As a result, the flow of blood to the affected areas is temporarily decreased. This usually occurs in response to cold temperatures or stress. During an attack, the skin in the affected areas turns white, then blue, and finally red. You may also feel tingling or numbness in those areas.  Attacks usually last for only a brief period, and then the blood flow to the area returns to normal. In most cases, Raynaud phenomenon does not cause serious health problems.  What are the causes?  In many cases, the cause of this condition is not known. The condition may occur on its own (primary Raynaud phenomenon) or may be associated with other diseases or factors (secondary Raynaud phenomenon).  Possible causes may include:  · Diseases or medical conditions that damage the arteries.  · Injuries and repetitive actions that hurt the hands or feet.  · Being exposed to certain chemicals.  · Taking medicines that narrow the arteries.  · Other medical conditions, such as lupus, scleroderma, rheumatoid arthritis, thyroid problems, blood disorders, Sjogren syndrome, or atherosclerosis.  What increases the risk?  The following factors may make you more likely to develop this condition:  · Being 20-40 years old.  · Being female.  · Having a family history of Raynaud phenomenon.  · Living in a cold climate.  · Smoking.  What are the signs or symptoms?  Symptoms of this condition usually occur when you are exposed to cold temperatures or when you have emotional stress. The symptoms may last for a few minutes or up to several hours. They usually affect your fingers but may also affect your toes, nipples, lips, ears, or  the tip of your nose. Symptoms may include:  · Changes in skin color. The skin in the affected areas will turn pale or white. The skin may then change from white to bluish to red as normal blood flow returns to the area.  · Numbness, tingling, or pain in the affected areas.  In severe cases, symptoms may include:  · Skin sores.  · Tissues decaying and dying (gangrene).  How is this diagnosed?  This condition may be diagnosed based on:  · Your symptoms and medical history.  · A physical exam. During the exam, you may be asked to put your hands in cold water to check for a reaction to cold temperature.  · Tests, such as:  ? Blood tests to check for other diseases or conditions.  ? A test to check the movement of blood through your arteries and veins (vascular ultrasound).  ? A test in which the skin at the base of your fingernail is examined under a microscope (nailfold capillaroscopy).  How is this treated?  Treatment for this condition often involves making lifestyle changes and taking steps to control your exposure to cold temperatures. For more severe cases, medicine (calcium channel blockers) may be used to improve blood flow. Surgery is sometimes done to block the nerves that control the affected arteries, but this is rare.  Follow these instructions at home:  Avoiding cold temperatures  Take these steps to avoid exposure to cold:  · If possible, stay indoors during cold weather.  · When you   go outside during cold weather, dress in layers and wear mittens, a hat, a scarf, and warm footwear.  · Wear mittens or gloves when handling ice or frozen food.  · Use holders for glasses or cans containing cold drinks.  · Let warm water run for a while before taking a shower or bath.  · Warm up the car before driving in cold weather.  Lifestyle    · If possible, avoid stressful and emotional situations. Try to find ways to manage your stress, such as:  ? Exercise.  ? Yoga.  ? Meditation.  ? Biofeedback.  · Do not use any  products that contain nicotine or tobacco, such as cigarettes and e-cigarettes. If you need help quitting, ask your health care provider.  · Avoid secondhand smoke.  · Limit your use of caffeine.  ? Switch to decaffeinated coffee, tea, and soda.  ? Avoid chocolate.  · Avoid vibrating tools and machinery.  General instructions  · Protect your hands and feet from injuries, cuts, or bruises.  · Avoid wearing tight rings or wristbands.  · Wear loose fitting socks and comfortable, roomy shoes.  · Take over-the-counter and prescription medicines only as told by your health care provider.  Contact a health care provider if:  · Your discomfort becomes worse despite lifestyle changes.  · You develop sores on your fingers or toes that do not heal.  · Your fingers or toes turn black.  · You have breaks in the skin on your fingers or toes.  · You have a fever.  · You have pain or swelling in your joints.  · You have a rash.  · Your symptoms occur on only one side of your body.  Summary  · Raynaud phenomenon is a condition that affects the arteries that carry blood to your fingers, toes, ears, lips, nipples, or the tip of your nose.  · In many cases, the cause of this condition is not known.  · Symptoms of this condition include changes in skin color, and numbness and tingling of the affected area.  · Treatment for this condition includes lifestyle changes, reducing exposure to cold temperatures, and using medicines for severe cases of the condition.  · Contact your health care provider if your condition worsens despite treatment.  This information is not intended to replace advice given to you by your health care provider. Make sure you discuss any questions you have with your health care provider.  Document Released: 11/29/2000 Document Revised: 06/17/2017 Document Reviewed: 01/13/2017  Elsevier Interactive Patient Education © 2019 [employer] Inc[employer]

## 2019-01-08 ENCOUNTER — Encounter: Payer: Self-pay | Admitting: Family Medicine

## 2019-01-08 ENCOUNTER — Telehealth: Payer: Self-pay | Admitting: Family Medicine

## 2019-01-08 NOTE — Telephone Encounter (Signed)
Letter released to full duty.

## 2019-01-11 ENCOUNTER — Encounter: Payer: Self-pay | Admitting: Rehabilitative and Restorative Service Providers"

## 2019-01-11 DIAGNOSIS — R202 Paresthesia of skin: Secondary | ICD-10-CM | POA: Diagnosis not present

## 2019-01-11 DIAGNOSIS — R209 Unspecified disturbances of skin sensation: Secondary | ICD-10-CM | POA: Diagnosis not present

## 2019-01-11 DIAGNOSIS — R252 Cramp and spasm: Secondary | ICD-10-CM | POA: Diagnosis not present

## 2019-01-11 MED FILL — JULEBER 0.15-30 MG-MCG TABS: 0.15-30 | 84 days supply | Qty: 84 | Fill #1

## 2019-01-12 LAB — BMP8+EGFR
BUN/Creatinine Ratio: 15 (ref 9–23)
BUN: 13 mg/dL (ref 6–24)
CO2: 23 mmol/L (ref 20–29)
Calcium: 9.1 mg/dL (ref 8.7–10.2)
Chloride: 106 mmol/L (ref 96–106)
Creatinine, Ser: 0.84 mg/dL (ref 0.57–1.00)
GFR calc Af Amer: 99 mL/min/{1.73_m2} (ref >59)
GFR calc non Af Amer: 86 mL/min/{1.73_m2} (ref >59)
Glucose: 103 mg/dL — ABNORMAL HIGH (ref 65–99)
Potassium: 3.7 mmol/L (ref 3.5–5.2)
Sodium: 142 mmol/L (ref 134–144)

## 2019-01-12 LAB — CBC WITH DIFFERENTIAL/PLATELET
Basophils Absolute: 0 10*3/uL (ref 0.0–0.2)
Basos: 0 %
EOS (ABSOLUTE): 0.2 10*3/uL (ref 0.0–0.4)
Eos: 2 %
Hematocrit: 37.5 % (ref 34.0–46.6)
Hemoglobin: 12.8 g/dL (ref 11.1–15.9)
Immature Grans (Abs): 0 10*3/uL (ref 0.0–0.1)
Immature Granulocytes: 0 %
Lymphocytes Absolute: 5.7 10*3/uL — ABNORMAL HIGH (ref 0.7–3.1)
Lymphs: 45 %
MCH: 28.7 pg (ref 26.6–33.0)
MCHC: 34.1 g/dL (ref 31.5–35.7)
MCV: 84 fL (ref 79–97)
Monocytes Absolute: 1.1 10*3/uL — ABNORMAL HIGH (ref 0.1–0.9)
Monocytes: 9 %
Neutrophils Absolute: 5.4 10*3/uL (ref 1.4–7.0)
Neutrophils: 44 %
Platelets: 337 10*3/uL (ref 150–450)
RBC: 4.46 x10E6/uL (ref 3.77–5.28)
RDW: 14.1 % (ref 11.7–15.4)
WBC: 12.5 10*3/uL — ABNORMAL HIGH (ref 3.4–10.8)

## 2019-01-12 LAB — FERRITIN: Ferritin: 56 ng/mL (ref 15–150)

## 2019-01-12 LAB — MAGNESIUM: Magnesium: 1.9 mg/dL (ref 1.6–2.3)

## 2019-01-12 LAB — B12 AND FOLATE PANEL
Folate: 6.7 ng/mL (ref >3.0)
VITAMIN B 12: 732 pg/mL (ref 232–1245)

## 2019-01-13 ENCOUNTER — Encounter: Payer: Self-pay | Admitting: Rehabilitative and Restorative Service Providers"

## 2019-01-13 NOTE — Progress Notes (Signed)
Already discussed with patient: b12 looks great. Will go back to once a month b12 shots. Iron stores look good. Magnesium great. Kidney function look great.   WBC is elevated with lymphocytes. This seems to be a recurring thing. Pt does not feel sick today. Will recheck at [facility]PE.

## 2019-01-15 ENCOUNTER — Ambulatory Visit (INDEPENDENT_AMBULATORY_CARE_PROVIDER_SITE_OTHER): Payer: 59 | Admitting: Physician Assistant

## 2019-01-15 ENCOUNTER — Encounter: Payer: Self-pay | Admitting: Physician Assistant

## 2019-01-15 VITALS — BP 110/72 | HR 102 | Ht 62.0 in | Wt 179.0 lb

## 2019-01-15 DIAGNOSIS — H11001 Unspecified pterygium of right eye: Secondary | ICD-10-CM

## 2019-01-15 DIAGNOSIS — J0141 Acute recurrent pansinusitis: Secondary | ICD-10-CM

## 2019-01-15 DIAGNOSIS — H04121 Dry eye syndrome of right lacrimal gland: Secondary | ICD-10-CM | POA: Diagnosis not present

## 2019-01-15 NOTE — Patient Instructions (Addendum)
Restart flonase/nasocort. Will make referral to ENT for recurrent sinus infections.  Follow up with eye doctor for pterygium.    Dry Eye  Dry eye, also called keratoconjunctivitis sicca, is dryness of the membranes surrounding the eye. It happens when there are not enough healthy, natural tears in the eyes. The eyes must remain moist at all times. A small amount of tears is constantly produced by the tear glands (lacrimal glands). These glands are located under the outside part of the upper eyelids. Dry eye can happen on its own or be a symptom of several conditions, such as rheumatoid arthritis, lupus, or Sjgren's syndrome. Dry eye may be mild to severe. What are the causes? This condition may be caused by:  Not making enough tears (aqueous tear-deficient dry eyes).  Tears evaporating from the eyes too quickly (evaporative dry eyes). This is when there is an abnormality in the quality of your tears. This abnormality causes your tears to evaporate so quickly that the eyes cannot be kept moist. What increases the risk? You are more likely to develop this condition if you:  Are a woman, especially if you have gone through menopause.  Live in a dry climate.  Live in a dusty or smoky area.  Take certain medicines, such as: ? Anti-allergy medicines (antihistamines). ? Blood pressure medicines (antihypertensives). ? Birth control pills (oral contraceptives). ? Laxatives. ? Tranquilizers.  Have a history of refractive eye surgery, such as LASIK.  Have a history of long-term contact lens use. What are the signs or symptoms? Symptoms of this condition include:  Irritation.  Itchiness.  Redness.  Burning.  Inflammation of the eyelids.  Feeling as though something is stuck in the eye.  Light sensitivity.  Increased sensitivity and discomfort when wearing contact lenses.  Vision that varies throughout the day.  Occasional excessive tearing. How is this diagnosed? This  condition is diagnosed based on your symptoms, your medical history, and an eye exam.  Your health care provider may look at your eye using a microscope and may put dyes in your eye to check the health of the surface of your eye.  You may have tests, such as a test to evaluate your tear production (Schirmer test). ? During this test, a small strip of special paper is gently pressed into the inner corner of your eye. ? Your tear production is measured by how much of the paper is moistened by your tears during a set amount of time. You may be referred to a health care provider who specializes in eyes and eyesight (ophthalmologist). How is this treated? Treatment for this condition depends on the severity. Mild cases are often treated at home. To help relieve your symptoms, your health care provider may recommend eye drops, which are also called artificial tears.  If your condition is severe, treatment may include: ? Prescription eye drops. ? Over-the-counter or prescription ointments to moisten your eyes. ? Minor surgery to place plugs into the tear ducts. This keep tears from draining so that tears can stay on the surface of the eye longer. ? Medicines to reduce inflammation of the eyelids. ? Taking an omega-3 fatty acid nutritional supplement. Follow these instructions at home:  Take or apply over-the-counter and prescription medicines only as told by your health care provider. This includes eye drops.  If directed, apply a warm compress to your eyes to help reduce inflammation. Place a towel over your eyes and gently press the warm compress over your eyes for about 5  minutes, or as long as told by your health care provider.  Drink plenty of fluids to stay well hydrated.  If possible, avoid dry, drafty environments.  Wear sunglasses when outdoors to protect your eyes from the sun and wind.  Use a humidifier at home to increase moisture in the air.  Remember to blink often when reading  or using the computer for long periods.  If you wear contact lenses, remove them regularly to give your eyes a break. Always remove contacts before sleeping.  Have a yearly eye exam and vision test.  Keep all follow-up visits as told by your health care provider. This is important. Contact a health care provider if:  You have eye pain.  You have pus-like fluid coming from your eye.  Your symptoms get worse or do not improve with treatment. Get help right away if:  Your vision suddenly changes. Summary  Dry eye is dryness of the membranes surrounding the eye.  Dry eye can happen on its own or be a symptom of several conditions, such as rheumatoid arthritis, lupus, or Sjgren's syndrome.  This condition is diagnosed based on your symptoms, your medical history, and an eye exam.  Treatment for this condition depends on the severity. Mild cases are often treated at home. To help relieve your symptoms, your health care provider may recommend eye drops, which are also called artificial tears. This information is not intended to replace advice given to you by your health care provider. Make sure you discuss any questions you have with your health care provider. Document Released: 10/19/2004 Document Revised: 05/26/2018 Document Reviewed: 05/26/2018 Elsevier Interactive Patient Education  [employer].

## 2019-01-15 NOTE — Progress Notes (Addendum)
Acute Office Visit  Subjective:    Patient ID: Kelsey Vaughan, female    DOB: 1976/11/19, 42 y.o.   MRN: [id number]  No chief complaint on file.   HPI Patient is in today for right eye irritation worsening. She has seen opthalmology before and told her she had dry eye. She also has a pterygium on the same eye. She has restasis and uses drops intermittently. Her eyes are intermittently watery and itchy. She does feel like going outside irritates them some. She does have recurrent sinus infection symptoms. She was last treated for sinus infection at the end of December. She has intermittent sinus pressure and nasal congestion.   Past Medical History:  Diagnosis Date  . Anxiety   . Atrial fibrillation ([place])    a. occuring in 02/2016  . B12 deficiency   . Constipation   . Foreign body in foot, left 07/2013  . GERD (gastroesophageal reflux disease)   . Hematuria   . Irregular menses    due to oral contraceptive  . Palpitations    a. 01/2017: Event monitor showing SR with PVC's.   . Sinus tachycardia   . Thyroid nodule   . Vitamin D deficiency     Past Surgical History:  Procedure Laterality Date  . CESAREAN SECTION    . FOREIGN BODY REMOVAL Left 08/05/2013   Procedure: LEFT FOOT FOREIGN BODY REMOVAL ADULT;  Surgeon: Wylene Simmer, MD;  Location: [facility];  Service: Orthopedics;  Laterality: Left;  . SHOULDER ARTHROSCOPY Left   . TUBAL LIGATION      Family History  Problem Relation Age of Onset  . Diabetes Mother   . Hypertension Mother   . Kidney disease Mother   . CAD Father   . Breast cancer Neg Hx     Social History   Socioeconomic History  . Marital status: Divorced    Spouse name: Not on file  . Number of children: 3  . Years of education: Not on file  . Highest education level: Not on file  Occupational History  . Occupation: Research scientist (life sciences): [employer]  . Financial resource strain: Not on file  .  Food insecurity:    Worry: Not on file    Inability: Not on file  . Transportation needs:    Medical: Not on file    Non-medical: Not on file  Tobacco Use  . Smoking status: Current Every Day Smoker    Years: 6.00    Types: Cigarettes  . Smokeless tobacco: Never Used  . Tobacco comment: 4-5 cigs/day  Substance and Sexual Activity  . Alcohol use: Yes    Comment: occasionally  . Drug use: No  . Sexual activity: Not on file    Comment: tubal ligation  Lifestyle  . Physical activity:    Days per week: Not on file    Minutes per session: Not on file  . Stress: Not on file  Relationships  . Social connections:    Talks on phone: Not on file    Gets together: Not on file    Attends religious service: Not on file    Active member of club or organization: Not on file    Attends meetings of clubs or organizations: Not on file    Relationship status: Not on file  . Intimate partner violence:    Fear of current or ex partner: Not on file    Emotionally abused: Not on file  Physically abused: Not on file    Forced sexual activity: Not on file  Other Topics Concern  . Not on file  Social History Narrative  . Not on file    Outpatient Medications Prior to Visit  Medication Sig Dispense Refill  . AMBULATORY NON FORMULARY MEDICATION salonpass patches with lidocaine may change every 8 hours as needed for pain. 90 patch 0  . AMBULATORY NON FORMULARY MEDICATION Take 5 mg by mouth daily. Release tablets    . Boric Acid CRYS 600mg  pv twice weekly for BV prevention. 125 g 0  . cycloSPORINE (RESTASIS MULTIDOSE) 0.05 % ophthalmic emulsion Place 1 drop into both eyes 2 (two) times daily. (Patient taking differently: Place 1 drop into both eyes 3 times/day as needed-between meals & bedtime. ) 0.4 mL 2  . desogestrel-ethinyl estradiol (APRI,EMOQUETTE,SOLIA) 0.15-30 MG-MCG tablet Take 1 tablet by mouth daily. 3 Package 4  . diclofenac sodium (VOLTAREN) 1 % GEL Apply 4 g topically 4 (four) times  daily. To affected joint. 100 g 11  . dimenhyDRINATE (DRAMAMINE) 50 MG tablet Take 1 tablet (50 mg total) by mouth every 8 (eight) hours as needed. 30 tablet 0  . levocetirizine (XYZAL) 5 MG tablet Take 1 tablet (5 mg total) by mouth every evening. 90 tablet 1  . nebivolol (BYSTOLIC) 2.5 MG tablet Take 1 tablet (2.5 mg total) by mouth daily as needed (palpatations). 90 tablet 4  . SUMAtriptan (IMITREX) 100 MG tablet Take 1 tablet (100 mg total) by mouth once as needed for up to 1 dose for migraine. May repeat in 2 hours if headache persists or recurs. 10 tablet 1  . valACYclovir (VALTREX) 1000 MG tablet Take 1 tablet (1,000 mg total) by mouth 2 (two) times daily. (Patient taking differently: Take 1,000 mg by mouth as needed. ) 14 tablet 11   No facility-administered medications prior to visit.     Allergies  Allergen Reactions  . Influenza Vaccines Hives and Rash  . Vyvanse [Lisdexamfetamine Dimesylate]     headache  . Wellbutrin [Bupropion] Other (See Comments)    Possible nipple soreness/headaches/not feeling right    ROS     Objective:    Physical Exam  Constitutional: She is oriented to person, place, and time. She appears well-developed and well-nourished.  HENT:  Head: Normocephalic and atraumatic.  Right Ear: External ear normal.  Left Ear: External ear normal.  Mouth/Throat: Oropharynx is clear and moist. No oropharyngeal exudate.  Right middle ear effusion. No erythema.  Bilateral nasal turbinates red and swollen.   Eyes: Pupils are equal, round, and reactive to light.  Right injected medial conjunctiva with pterygium. Watery discharge.   Neck: Normal range of motion.  Cardiovascular: Regular rhythm.  Tachycardia.   Pulmonary/Chest: Effort normal and breath sounds normal.  Lymphadenopathy:    She has no cervical adenopathy.  Neurological: She is alert and oriented to person, place, and time.  Psychiatric: She has a normal mood and affect. Her behavior is normal.     BP 110/72   Pulse (!) 102   Ht 5\' 2"  (1.575 m)   Wt 179 lb (81.2 kg)   LMP  (LMP Unknown)   BMI 32.74 kg/m  Wt Readings from Last 3 Encounters:  01/15/19 179 lb (81.2 kg)  01/07/19 175 lb (79.4 kg)  12/17/18 171 lb (77.6 kg)    There are no preventive care reminders to display for this patient.  There are no preventive care reminders to display for this patient.  Lab Results  Component Value Date   TSH 1.03 05/22/2018   Lab Results  Component Value Date   WBC 12.5 (H) 01/11/2019   HGB 12.8 01/11/2019   HCT 37.5 01/11/2019   MCV 84 01/11/2019   PLT 337 01/11/2019   Lab Results  Component Value Date   NA 142 01/11/2019   K 3.7 01/11/2019   CO2 23 01/11/2019   GLUCOSE 103 (H) 01/11/2019   BUN 13 01/11/2019   CREATININE 0.84 01/11/2019   BILITOT 0.2 05/22/2018   ALKPHOS 63 06/30/2017   AST 10 05/22/2018   ALT 12 05/22/2018   PROT 6.8 05/22/2018   ALBUMIN 4.0 06/30/2017   CALCIUM 9.1 01/11/2019   Lab Results  Component Value Date   CHOL 182 09/02/2018   Lab Results  Component Value Date   HDL 52 09/02/2018   Lab Results  Component Value Date   LDLCALC 94 09/02/2018   Lab Results  Component Value Date   TRIG 240 (H) 09/02/2018   Lab Results  Component Value Date   CHOLHDL 3.5 09/02/2018   Lab Results  Component Value Date   HGBA1C 5.4 05/22/2018       Assessment & Plan:   Problem List Items Addressed This Visit      Unprioritized   Dry eye of right side - Primary   Pterygium of right eye   Acute recurrent pansinusitis       No orders of the defined types were placed in this encounter.  Discussed treatment of eyes. With it only being one eye I do not think this represents allergies or virus. I would like for her to take retasis regularly and supplement with visine saline drops for lubrication. Follow up with opthalmology for dry eye.   Discussed testing for sjorgens syndrome. Pt will consider.   Symptoms are consistent with  recurrent sinusitis. Start flonase regularly. Avoid antihistamine right now due to dry eye symptoms.   Iran Planas, PA-C

## 2019-01-18 ENCOUNTER — Other Ambulatory Visit: Payer: Self-pay | Admitting: Physician Assistant

## 2019-01-18 ENCOUNTER — Encounter: Payer: Self-pay | Admitting: Physician Assistant

## 2019-01-18 DIAGNOSIS — H11001 Unspecified pterygium of right eye: Secondary | ICD-10-CM | POA: Insufficient documentation

## 2019-01-18 DIAGNOSIS — Z1231 Encounter for screening mammogram for malignant neoplasm of breast: Secondary | ICD-10-CM

## 2019-01-18 DIAGNOSIS — J0141 Acute recurrent pansinusitis: Secondary | ICD-10-CM | POA: Insufficient documentation

## 2019-01-18 DIAGNOSIS — H04121 Dry eye syndrome of right lacrimal gland: Secondary | ICD-10-CM | POA: Insufficient documentation

## 2019-01-19 ENCOUNTER — Ambulatory Visit (INDEPENDENT_AMBULATORY_CARE_PROVIDER_SITE_OTHER): Payer: 59 | Admitting: Physician Assistant

## 2019-01-19 ENCOUNTER — Encounter: Payer: Self-pay | Admitting: Physician Assistant

## 2019-01-19 VITALS — BP 124/74 | HR 98 | Ht 61.0 in | Wt 175.0 lb

## 2019-01-19 DIAGNOSIS — G43109 Migraine with aura, not intractable, without status migrainosus: Secondary | ICD-10-CM | POA: Diagnosis not present

## 2019-01-19 DIAGNOSIS — J3489 Other specified disorders of nose and nasal sinuses: Secondary | ICD-10-CM | POA: Diagnosis not present

## 2019-01-19 MED ORDER — AZITHROMYCIN 250 MG PO TABS: ORAL_TABLET | ORAL | 0 refills | Status: DC

## 2019-01-19 MED ORDER — KETOROLAC TROMETHAMINE 60 MG/2ML IM SOLN
60.0000 mg | Freq: Once | INTRAMUSCULAR | Status: AC
  Administered 2019-01-19: 60 mg via INTRAMUSCULAR

## 2019-01-19 MED ORDER — DEXAMETHASONE SODIUM PHOSPHATE 10 MG/ML IJ SOLN
8.0000 mg | Freq: Once | INTRAMUSCULAR | Status: AC
  Administered 2019-01-19: 8 mg via INTRAMUSCULAR

## 2019-01-19 MED ORDER — ONDANSETRON HCL 4 MG PO TABS
8.0000 mg | ORAL_TABLET | Freq: Once | ORAL | Status: AC
  Administered 2019-01-19: 8 mg via ORAL
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
## 2019-01-19 NOTE — Patient Instructions (Signed)
Sinus Headache          A sinus headache occurs when your sinuses become clogged or swollen. Sinuses are air-filled spaces in your skull that are behind the bones of your face and forehead. Sinus headaches can range from mild to severe.  What are the causes?  A sinus headache can result from various conditions that affect the sinuses. Common causes include:   Colds.   Sinus infections.   Allergies.  Many people confuse sinus headaches with migraines or tension headaches because those headaches can also cause facial pain and nasal symptoms.  What are the signs or symptoms?  The main symptom of this condition is a headache that may feel like pain or pressure in your face, forehead, ears, or upper teeth. People who have a sinus headache often have other symptoms, such as:   Congested or runny nose.   Fever.   Inability to smell.  Weather changes can make symptoms worse.  How is this diagnosed?  This condition may be diagnosed based on:   A physical exam and medical history.   Imaging tests, such as a CT scan or MRI, to check for problems with the sinuses.   Examination of the sinuses using a thin tool with a camera that is inserted through your nose (endoscopy).  How is this treated?  Treatment for this condition depends on the cause.   Sinus pain that is caused by a sinus infection may be treated with antibiotic medicine.   Sinus pain that is caused by allergies may be helped by allergy medicines (antihistamines) and medicated nasal sprays.   Sinus pain that is caused by congestion may be helped by rinsing out (flushing) the nose and sinuses with saline solution.   Sinus surgery may be needed in some cases if other treatments do not help.  Follow these instructions at home:  General instructions   If directed:  ? Apply a warm, moist washcloth to your face to help relieve pain.  ? Use a nasal saline wash.  Medicines     Take over-the-counter and prescription medicines only as told by your health care  provider.   If you were prescribed an antibiotic medicine, take it as told by your health care provider. Do not stop taking the antibiotic even if you start to feel better.   If you have congestion, use a nasal spray to help lessen pressure.  Hydrate and humidify   Drink enough water to keep your urine clear or pale yellow. Staying hydrated will help to thin your mucus.   Use a cool mist humidifier to keep the humidity level in your home above 50%.   Inhale steam for 10-15 minutes, 3-4 times a day or as told by your health care provider. You can do this in the bathroom while a hot shower is running.   Limit your exposure to cool or dry air.  Contact a health care provider if:   You have a headache more than one time a week.   You have sensitivity to light or sound.   You develop a fever.   You feel nauseous or you vomit.   Your headaches do not get better with treatment. Many people think that they have a sinus headache when they actually have a migraine or a tension headache.  Get help right away if:   You have vision problems.   You have sudden, severe pain in your face or head.   You have a seizure.     You are confused.   You have a stiff neck.  Summary   A sinus headache occurs when your sinuses become clogged or swollen.   A sinus headache can result from various conditions that affect the sinuses, such as a cold, a sinus infection, or an allergy.   Treatment for this condition depends on the cause. It may include medicine, such as antibiotics or antihistamines.  This information is not intended to replace advice given to you by your health care provider. Make sure you discuss any questions you have with your health care provider.  Document Released: 01/09/2005 Document Revised: 09/12/2017 Document Reviewed: 09/12/2017  Elsevier Interactive Patient Education  2019 [employer]

## 2019-01-20 ENCOUNTER — Ambulatory Visit (HOSPITAL_BASED_OUTPATIENT_CLINIC_OR_DEPARTMENT_OTHER)
Admission: RE | Admit: 2019-01-20 | Discharge: 2019-01-20 | Disposition: A | Discharge status: Home / Self Care | Payer: 59 | Source: Ambulatory Visit | Attending: Physician Assistant | Admitting: Physician Assistant

## 2019-01-20 ENCOUNTER — Other Ambulatory Visit: Payer: Self-pay | Admitting: Physician Assistant

## 2019-01-20 DIAGNOSIS — R209 Unspecified disturbances of skin sensation: Secondary | ICD-10-CM | POA: Insufficient documentation

## 2019-01-20 DIAGNOSIS — R252 Cramp and spasm: Secondary | ICD-10-CM | POA: Insufficient documentation

## 2019-01-20 DIAGNOSIS — F17200 Nicotine dependence, unspecified, uncomplicated: Secondary | ICD-10-CM | POA: Diagnosis not present

## 2019-01-20 DIAGNOSIS — R202 Paresthesia of skin: Secondary | ICD-10-CM | POA: Diagnosis not present

## 2019-01-20 MED ORDER — MOMETASONE FUROATE 50 MCG/ACT NA SUSP: NASAL | 3 refills | Status: DC

## 2019-01-20 MED FILL — AZITHROMYCIN 250 MG TABLET: 250 | 5 days supply | Qty: 6 | Fill #0

## 2019-01-20 NOTE — Progress Notes (Signed)
ABI with TBI Doppler performed    Bilateral ABIs appear essentially unchanged compared to prior study on 02/19/17. Left TBIs appear decreased compared to prior study on 02/19/17. B  Right: Resting right ankle-brachial index is within normal range. No evidence of significant right lower extremity arterial disease. The right toe-brachial index is normal.   Left: Resting left ankle-brachial index is within normal range. No evidence of significant left lower extremity arterial disease. The left toe-brachial index is abnormal.    01/20/19 Cardell Peach RDCS, RVT

## 2019-01-20 NOTE — Progress Notes (Signed)
Call pt: normal ABI however there is a somewhat abnormal left toe-brachial index indicating some vasoconstriction of this are but does not change any treatment plan. Do you notice more symptoms in the left toes?

## 2019-01-25 ENCOUNTER — Encounter: Payer: Self-pay | Admitting: Neurology

## 2019-01-25 ENCOUNTER — Encounter: Payer: Self-pay | Admitting: Physician Assistant

## 2019-01-25 DIAGNOSIS — J329 Chronic sinusitis, unspecified: Secondary | ICD-10-CM

## 2019-01-27 ENCOUNTER — Ambulatory Visit (INDEPENDENT_AMBULATORY_CARE_PROVIDER_SITE_OTHER): Payer: 59

## 2019-01-27 ENCOUNTER — Other Ambulatory Visit: Payer: Self-pay

## 2019-01-27 ENCOUNTER — Telehealth: Payer: Self-pay | Admitting: Physician Assistant

## 2019-01-27 DIAGNOSIS — J3489 Other specified disorders of nose and nasal sinuses: Secondary | ICD-10-CM | POA: Diagnosis not present

## 2019-01-27 DIAGNOSIS — J329 Chronic sinusitis, unspecified: Secondary | ICD-10-CM | POA: Diagnosis not present

## 2019-01-27 MED ORDER — FEXOFENADINE-PSEUDOEPHED ER 180-240 MG PO TB24: 1.0000 | ORAL_TABLET | Freq: Every day | ORAL | 4 refills | Status: DC

## 2019-01-27 NOTE — Telephone Encounter (Signed)
Started mid morning started with clear nasal discharge, eye red/itchy/watery, sinus congestion and dizziness. These are the flares she gets.   Not taking xzyal because makes her too sleepy. She works at night and during the day.   Will send allegra D to pharmacy.   Has ENT appt for later this month and CT will be done today.

## 2019-01-28 MED ORDER — AMOXICILLIN-POT CLAVULANATE 500-125 MG PO TABS: 1.0000 | ORAL_TABLET | Freq: Two times a day (BID) | ORAL | 0 refills | Status: DC

## 2019-01-28 NOTE — Addendum Note (Signed)
Addended by: Donella Stade on: 01/28/2019 09:43 AM   Modules accepted: Orders

## 2019-01-29 ENCOUNTER — Other Ambulatory Visit: Payer: Self-pay | Admitting: Physician Assistant

## 2019-01-29 ENCOUNTER — Encounter: Payer: Self-pay | Admitting: Family Medicine

## 2019-01-29 DIAGNOSIS — R3129 Other microscopic hematuria: Secondary | ICD-10-CM | POA: Diagnosis not present

## 2019-01-29 DIAGNOSIS — N039 Chronic nephritic syndrome with unspecified morphologic changes: Secondary | ICD-10-CM | POA: Diagnosis not present

## 2019-01-29 DIAGNOSIS — R801 Persistent proteinuria, unspecified: Secondary | ICD-10-CM | POA: Diagnosis not present

## 2019-01-29 MED ORDER — FLUCONAZOLE 150 MG PO TABS: ORAL_TABLET | ORAL | 0 refills | Status: DC

## 2019-01-29 NOTE — Progress Notes (Signed)
di

## 2019-02-05 ENCOUNTER — Other Ambulatory Visit: Payer: Self-pay

## 2019-02-05 MED ORDER — VALACYCLOVIR HCL 1 G PO TABS: 1000.0000 mg | ORAL_TABLET | Freq: Two times a day (BID) | ORAL | 11 refills | Status: DC

## 2019-02-08 DIAGNOSIS — F17200 Nicotine dependence, unspecified, uncomplicated: Secondary | ICD-10-CM | POA: Diagnosis not present

## 2019-02-08 DIAGNOSIS — J309 Allergic rhinitis, unspecified: Secondary | ICD-10-CM | POA: Diagnosis not present

## 2019-02-08 DIAGNOSIS — J343 Hypertrophy of nasal turbinates: Secondary | ICD-10-CM | POA: Diagnosis not present

## 2019-02-09 ENCOUNTER — Other Ambulatory Visit: Payer: Self-pay | Admitting: Physician Assistant

## 2019-02-09 ENCOUNTER — Ambulatory Visit (INDEPENDENT_AMBULATORY_CARE_PROVIDER_SITE_OTHER): Payer: 59 | Admitting: Physician Assistant

## 2019-02-09 VITALS — BP 112/67 | HR 95

## 2019-02-09 DIAGNOSIS — J329 Chronic sinusitis, unspecified: Secondary | ICD-10-CM | POA: Diagnosis not present

## 2019-02-09 MED ORDER — KETOROLAC TROMETHAMINE 60 MG/2ML IM SOLN
60.0000 mg | Freq: Once | INTRAMUSCULAR | Status: AC
  Administered 2019-02-09: 60 mg via INTRAMUSCULAR

## 2019-02-09 NOTE — Progress Notes (Signed)
Pt seen today for recurrent sinusitis/chronic with facial pain. Denies taking OTC ibuprofen. Per PCP, Toradol 60mg  IM injected in RUOQ. Pt tolerated injection well, no immediate complications.   Encouraged patient to start augmentin for chronic sinusitis.   Iran Planas PAC-C

## 2019-02-12 ENCOUNTER — Encounter: Payer: Self-pay | Admitting: Physician Assistant

## 2019-02-12 ENCOUNTER — Ambulatory Visit (INDEPENDENT_AMBULATORY_CARE_PROVIDER_SITE_OTHER): Payer: 59 | Admitting: Physician Assistant

## 2019-02-12 VITALS — BP 113/65 | HR 96 | Temp 97.4°F | Wt 181.0 lb

## 2019-02-12 DIAGNOSIS — R635 Abnormal weight gain: Secondary | ICD-10-CM

## 2019-02-12 DIAGNOSIS — R41840 Attention and concentration deficit: Secondary | ICD-10-CM | POA: Diagnosis not present

## 2019-02-12 DIAGNOSIS — F411 Generalized anxiety disorder: Secondary | ICD-10-CM | POA: Diagnosis not present

## 2019-02-12 DIAGNOSIS — D72829 Elevated white blood cell count, unspecified: Secondary | ICD-10-CM

## 2019-02-12 DIAGNOSIS — F330 Major depressive disorder, recurrent, mild: Secondary | ICD-10-CM | POA: Diagnosis not present

## 2019-02-12 DIAGNOSIS — R4587 Impulsiveness: Secondary | ICD-10-CM | POA: Diagnosis not present
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
## 2019-02-12 NOTE — Patient Instructions (Signed)
Attention Deficit Hyperactivity Disorder, Adult Attention deficit hyperactivity disorder (ADHD) is a mental health disorder that starts during childhood. For many people with ADHD, the disorder continues into adult years. There are many things that you and your health care provider or therapist (mental health professional) can do to manage your symptoms. What are the causes? The exact cause of ADHD is not known. What increases the risk? You are more likely to develop this condition if:  You have a family history of ADHD.  You are female.  You were born to a mother who smoked or drank alcohol during pregnancy.  You were exposed to lead poisoning or other toxins in the womb or in early life.  You were born before 37 weeks of pregnancy (prematurely) or you had a low birth weight.  You have experienced a brain injury. What are the signs or symptoms? Symptoms of this condition depend on the type of ADHD. The two main types are inattentive and hyperactive-impulsive. Some people may have symptoms of both types. Symptoms of the inattentive type include:  Difficulty watching, listening, or thinking with focused effort (paying attention).  Making careless mistakes.  Not listening.  Not following instructions.  Being disorganized.  Avoiding tasks that require time and attention.  Losing things.  Forgetting things.  Being easily distracted. Symptoms of the hyperactive-impulsive type include:  Restlessness.  Talking too much.  Interrupting.  Difficulty with: ? Sitting still. ? Staying quiet. ? Feeling motivated. ? Relaxing. ? Waiting in line or waiting for a turn. How is this diagnosed? This condition is diagnosed based on your current symptoms and your history of symptoms. The diagnosis can be made by a provider such as a primary care provider, psychiatrist, psychologist, or clinical social worker. The provider may use a symptom checklist or a standardized behavior rating  scale to evaluate your symptoms. He or she may want to talk with family members who have known you for a long time and have observed your behaviors. There are no lab tests or brain imaging tests that can diagnose ADHD. How is this treated? This condition can be treated with medicines and behavior therapy. Medicines may be the best option to reduce impulsive behaviors and improve attention. Your health care provider may recommend:  Stimulant medicines. These are the most common medicines used for adult ADHD. They affect certain chemicals in the brain (neurotransmitters). These medicines may be long-acting or short-acting. This will determine how often you need to take the medicine.  A non-stimulant medicine for adult ADHD (atomoxetine). This medicine increases a neurotransmitter called norepinephrine. It may take weeks to months to see effects from this medicine. Psychotherapy and behavioral management are also important for treating ADHD. Psychotherapy is often used along with medicine. Your health care provider may suggest:  Cognitive behavioral therapy (CBT). This type of therapy teaches you to replace negative thoughts and actions with positive thoughts and actions. When used as part of ADHD treatment, this therapy may also include: ? Coping strategies for organization, time management, impulse control, and stress reduction. ? Mindfulness and meditation training.  Behavioral management. This may include strategies for organization and time management. You may work with an ADHD coach who is specially trained to help people with ADHD to manage and organize activities and to function more effectively. Follow these instructions at home: Medicines   Take over-the-counter and prescription medicines only as told by your health care provider.  Talk with your health care provider about the possible side effects of your   medicine to watch for. General instructions   Learn as much as you can about  adult ADHD, and work closely with your health care providers to find the treatments that work best for you.  Do not use drugs or abuse alcohol. Limit alcohol intake to no more than 1 drink a day for nonpregnant women and 2 drinks a day for men. One drink equals 12 oz of beer, 5 oz of wine, or 1 oz of hard liquor.  Follow the same schedule each day. Make sure your schedule includes enough time for you to get plenty of sleep.  Use reminder devices like notes, calendars, and phone apps to stay on-time and organized.  Eat a healthy diet. Do not skip meals.  Exercise regularly. Exercise can help to reduce stress and anxiety.  Keep all follow-up visits as told by your health care provider and therapist. This is important. Where to find more information  A health care provider may be able to recommend resources that are available online or over the phone. You could start with: ? Attention Deficit Disorder Association (ADDA): www.add.org ? National Institute of Mental Health (NIMH): www.nimh.nih.gov Contact a health care provider if:  Your symptoms are changing, getting worse, or not improving.  You have side effects from your medicine, such as: ? Repeated muscle twitches, coughing, or speech outbursts. ? Sleep problems. ? Loss of appetite. ? Depression. ? New or worsening behavior problems. ? Dizziness. ? Unusually fast heartbeat. ? Stomach pains. ? Headaches.  You are struggling with anxiety, depression, or substance abuse. Get help right away if:  You have a severe reaction to a medicine.  You have thoughts of hurting yourself or others. If you ever feel like you may hurt yourself or others, or have thoughts about taking your own life, get help right away. You can go to the nearest emergency department or call:  Your local emergency services (911 in the [place]  A suicide crisis helpline, such as the National Suicide Prevention Lifeline at 1-800-273-8255. This is open 24 hours a  day. Summary  ADHD is a mental health disorder that starts during childhood and often continues into adult years.  The exact cause of ADHD is not known.  There is no cure for ADHD, but treatment with medicine, therapy, or behavioral training can help you manage your condition. This information is not intended to replace advice given to you by your health care provider. Make sure you discuss any questions you have with your health care provider. Document Released: 07/24/2017 Document Revised: 07/24/2017 Document Reviewed: 07/24/2017 Elsevier Interactive Patient Education  2019 [employer]  

## 2019-02-15 ENCOUNTER — Encounter: Payer: 59 | Admitting: Neurology

## 2019-02-15 ENCOUNTER — Encounter: Payer: Self-pay | Admitting: Physician Assistant

## 2019-02-15 DIAGNOSIS — R4587 Impulsiveness: Secondary | ICD-10-CM | POA: Insufficient documentation

## 2019-02-15 DIAGNOSIS — R41840 Attention and concentration deficit: Secondary | ICD-10-CM | POA: Insufficient documentation

## 2019-02-17 ENCOUNTER — Encounter: Payer: Self-pay | Admitting: Physician Assistant

## 2019-02-17 ENCOUNTER — Ambulatory Visit (INDEPENDENT_AMBULATORY_CARE_PROVIDER_SITE_OTHER): Payer: 59 | Admitting: Physician Assistant

## 2019-02-17 DIAGNOSIS — F411 Generalized anxiety disorder: Secondary | ICD-10-CM

## 2019-02-17 DIAGNOSIS — F330 Major depressive disorder, recurrent, mild: Secondary | ICD-10-CM

## 2019-02-17 DIAGNOSIS — R4586 Emotional lability: Secondary | ICD-10-CM

## 2019-02-17 MED ORDER — VORTIOXETINE HBR 5 MG PO TABS: 5.0000 mg | ORAL_TABLET | Freq: Every day | ORAL | 1 refills | Status: DC

## 2019-02-17 MED FILL — TRINTELLIX 5 MG TABLET: 5 | 30 days supply | Qty: 30 | Fill #0
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
## 2019-02-17 NOTE — Patient Instructions (Signed)
Tracy-appt for counseling and then look into start date of outpatient treatment.

## 2019-02-18 ENCOUNTER — Telehealth (HOSPITAL_COMMUNITY): Payer: Self-pay | Admitting: Psychiatry

## 2019-02-19 ENCOUNTER — Ambulatory Visit: Payer: Self-pay | Admitting: Physician Assistant

## 2019-02-22 ENCOUNTER — Encounter: Payer: Self-pay | Admitting: Physician Assistant

## 2019-02-22 ENCOUNTER — Ambulatory Visit (INDEPENDENT_AMBULATORY_CARE_PROVIDER_SITE_OTHER): Payer: 59 | Admitting: Physician Assistant

## 2019-02-22 ENCOUNTER — Ambulatory Visit: Payer: 59 | Admitting: Physician Assistant

## 2019-02-22 VITALS — BP 116/71 | HR 96 | Ht 61.0 in | Wt 180.0 lb

## 2019-02-22 DIAGNOSIS — R4586 Emotional lability: Secondary | ICD-10-CM

## 2019-02-22 DIAGNOSIS — F330 Major depressive disorder, recurrent, mild: Secondary | ICD-10-CM | POA: Diagnosis not present

## 2019-02-22 DIAGNOSIS — F411 Generalized anxiety disorder: Secondary | ICD-10-CM | POA: Diagnosis not present
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
## 2019-02-23 ENCOUNTER — Telehealth: Payer: Self-pay

## 2019-02-23 MED ORDER — METRONIDAZOLE 500 MG PO TABS: 500.0000 mg | ORAL_TABLET | Freq: Two times a day (BID) | ORAL | 0 refills | Status: DC

## 2019-02-23 MED ORDER — FLUCONAZOLE 150 MG PO TABS: 150.0000 mg | ORAL_TABLET | Freq: Once | ORAL | 0 refills | Status: AC

## 2019-02-23 MED FILL — FLUCONAZOLE 150 MG TABS: 150 | 1 days supply | Qty: 1 | Fill #0

## 2019-02-23 MED FILL — metroNIDAZOLE 500 MG TABS: 500 | 7 days supply | Qty: 14 | Fill #0

## 2019-02-23 NOTE — Telephone Encounter (Signed)
sent 

## 2019-02-23 NOTE — Telephone Encounter (Signed)
Pt is requesting for metronidazole to be sent to the pharmacy. Please advise. -EH/RMA

## 2019-03-01 ENCOUNTER — Encounter: Payer: 59 | Admitting: Neurology

## 2019-03-08 ENCOUNTER — Ambulatory Visit: Payer: Self-pay

## 2019-03-10 ENCOUNTER — Telehealth: Payer: Self-pay

## 2019-03-10 NOTE — Telephone Encounter (Signed)
Ok done. Will lay on desk.

## 2019-03-10 NOTE — Telephone Encounter (Signed)
Kurstin from [place] called letting Luvenia Starch know she faxed some paperwork to be completed for pt. States that due date for this paperwork is 03/15/19.  Luvenia Starch, let me know when paperwork is completed and I will make sure that this gets faxed to [facility].   CB # [id number] Fax # [phone] Ref #[id number]

## 2019-03-10 NOTE — Telephone Encounter (Signed)
Faxed. Copy filed.

## 2019-03-11 NOTE — Telephone Encounter (Signed)
Received fax confirmatin.   Left pt msg advising paperwork completed and faxed, and that original copy is at front desk for pt to pick up.   Copy scanned to patient's chart.

## 2019-03-12 NOTE — Telephone Encounter (Signed)
Received a call from Mercer with [employer].   Received the fax that was sent but states she needs some additional information and clarification:  1. She is requesting all office notes from 02/25/19 to present. OK to send?  2. She needs a more detailed explanation of what patient's job is and what it entails.   3. Requesting clarification on when patient's estimated return to work date it, some items states 03/12/19 and other stated 03/18/19.  Patient's case if due for review 03/15/19. I have advised Elnita Maxwell that provider is out of office but I would make efforts to contact her prior to review date.  Requesting information be faxed to her at [phone] Case # [id number]. Kirsten direct call back number is [phone]

## 2019-03-15 NOTE — Telephone Encounter (Signed)
Faxed along with copy of last office visit

## 2019-03-15 NOTE — Telephone Encounter (Signed)
1. ok to send notes.  2. she rooms patients, gets history, preforms POCT task, answers phones, call patients with results.  3. her outpatient intensive therapy class last date is now 03/17/19 and can return to work 03/18/19.

## 2019-03-23 ENCOUNTER — Other Ambulatory Visit: Payer: Self-pay

## 2019-03-23 DIAGNOSIS — G4452 New daily persistent headache (NDPH): Secondary | ICD-10-CM

## 2019-03-23 MED ORDER — SUMATRIPTAN SUCCINATE 100 MG PO TABS: 100.0000 mg | ORAL_TABLET | Freq: Once | ORAL | 1 refills | Status: DC | PRN

## 2019-03-23 MED ORDER — AZELASTINE HCL 0.1 % NA SOLN: 1.0000 | Freq: Two times a day (BID) | NASAL | 2 refills | Status: DC

## 2019-03-23 MED FILL — SUMAtriptan SUCCINATE 100 M: 100 | 30 days supply | Qty: 9 | Fill #0

## 2019-03-23 MED FILL — AZELASTINE HCL 137 MCG SPRY: 0.1 | 25 days supply | Qty: 30 | Fill #0

## 2019-03-23 NOTE — Telephone Encounter (Signed)
PCP requested I send in RF for pt

## 2019-03-23 NOTE — Telephone Encounter (Signed)
Provider has verbally requested that I send patient an RX for Azelastine nasal spray, 1-2 sprays each nostril BID with 2 RF

## 2019-03-29 ENCOUNTER — Telehealth: Payer: Self-pay | Admitting: Physician Assistant

## 2019-03-29 DIAGNOSIS — M25649 Stiffness of unspecified hand, not elsewhere classified: Secondary | ICD-10-CM

## 2019-03-29 NOTE — Telephone Encounter (Signed)
Will follow up after labs. Lots of hand joint stiffness and pain.

## 2019-03-30 ENCOUNTER — Telehealth: Payer: Self-pay

## 2019-03-30 ENCOUNTER — Telehealth (INDEPENDENT_AMBULATORY_CARE_PROVIDER_SITE_OTHER): Payer: 59 | Admitting: Physician Assistant

## 2019-03-30 VITALS — BP 133/84 | HR 100 | Temp 97.8°F | Ht 61.0 in | Wt 180.0 lb

## 2019-03-30 DIAGNOSIS — M25649 Stiffness of unspecified hand, not elsewhere classified: Secondary | ICD-10-CM

## 2019-03-30 DIAGNOSIS — N898 Other specified noninflammatory disorders of vagina: Secondary | ICD-10-CM

## 2019-03-30 DIAGNOSIS — R399 Unspecified symptoms and signs involving the genitourinary system: Secondary | ICD-10-CM

## 2019-03-30 LAB — POCT URINALYSIS DIPSTICK
Bilirubin, UA: NEGATIVE
Glucose, UA: NEGATIVE
Ketones, UA: NEGATIVE
Nitrite, UA: NEGATIVE
Protein, UA: POSITIVE — AB
Spec Grav, UA: 1.025 (ref 1.010–1.025)
Urobilinogen, UA: 0.2 E.U./dL
pH, UA: 7 (ref 5.0–8.0)

## 2019-03-30 MED ORDER — METRONIDAZOLE 500 MG PO TABS: 500.0000 mg | ORAL_TABLET | Freq: Two times a day (BID) | ORAL | 0 refills | Status: DC

## 2019-03-30 MED ORDER — BORIC ACID CRYS: CRYSTALS | 0 refills | Status: DC

## 2019-03-30 MED ORDER — FLUCONAZOLE 150 MG PO TABS: 150.0000 mg | ORAL_TABLET | Freq: Once | ORAL | 0 refills | Status: AC

## 2019-03-30 MED ORDER — VALACYCLOVIR HCL 1 G PO TABS: 1000.0000 mg | ORAL_TABLET | Freq: Two times a day (BID) | ORAL | 11 refills | Status: DC

## 2019-03-30 NOTE — Telephone Encounter (Signed)
At patient's request - labs reorder through [facility].
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
## 2019-03-31 ENCOUNTER — Encounter: Payer: Self-pay | Admitting: Physician Assistant

## 2019-03-31 LAB — WET PREP FOR TRICH, YEAST, CLUE
MICRO NUMBER:: 394291
Specimen Quality: ADEQUATE

## 2019-03-31 LAB — URINALYSIS, MICROSCOPIC ONLY
Bacteria, UA: NONE SEEN /HPF
Hyaline Cast: NONE SEEN /LPF

## 2019-03-31 LAB — URINE CULTURE
MICRO NUMBER:: 394851
SPECIMEN QUALITY:: ADEQUATE

## 2019-04-27 ENCOUNTER — Ambulatory Visit: Payer: Self-pay | Admitting: Psychology

## 2019-05-06 ENCOUNTER — Encounter: Payer: Self-pay | Admitting: Physician Assistant

## 2019-05-06 DIAGNOSIS — Z20822 Contact with and (suspected) exposure to covid-19: Secondary | ICD-10-CM

## 2019-05-07 ENCOUNTER — Other Ambulatory Visit: Payer: Self-pay

## 2019-05-07 ENCOUNTER — Telehealth: Payer: Self-pay | Admitting: Physician Assistant

## 2019-05-07 ENCOUNTER — Telehealth: Payer: Self-pay

## 2019-05-07 DIAGNOSIS — R6889 Other general symptoms and signs: Secondary | ICD-10-CM | POA: Diagnosis not present

## 2019-05-07 DIAGNOSIS — Z20822 Contact with and (suspected) exposure to covid-19: Secondary | ICD-10-CM

## 2019-05-07 NOTE — Telephone Encounter (Signed)
Dr. Alden Hipp requesting COVID 19 testing.

## 2019-05-07 NOTE — Telephone Encounter (Signed)
Order has been place for the covid-19 test.

## 2019-05-07 NOTE — Telephone Encounter (Signed)
Pt is a 43 yo female who calls in after COVID positive exposure at work. She has a headache and some nausea. She is requesting testing. Pt routed for testing.

## 2019-05-07 NOTE — Telephone Encounter (Signed)
Attempted to call pt but no answer at this time. Left voicemail message for pt to return call. Pt will need to be scheduled for COVID-19 testing. Order has already been placed.

## 2019-05-07 NOTE — Telephone Encounter (Signed)
Attempted to call patient to schedule testing. Left message for the patient to call back for her scheduling.

## 2019-05-07 NOTE — Telephone Encounter (Signed)
No answer, when called. Left voice message.

## 2019-05-07 NOTE — Telephone Encounter (Signed)
Attempted to call pt to schedule testing for covid-19. No answer, will try to call back.

## 2019-05-09 LAB — NOVEL CORONAVIRUS, NAA: SARS-CoV-2, NAA: NOT DETECTED

## 2019-05-11 NOTE — Progress Notes (Signed)
Call pt: negative for covid.

## 2019-05-13 ENCOUNTER — Ambulatory Visit (INDEPENDENT_AMBULATORY_CARE_PROVIDER_SITE_OTHER): Payer: 59 | Admitting: Psychology

## 2019-05-13 DIAGNOSIS — F319 Bipolar disorder, unspecified: Secondary | ICD-10-CM | POA: Diagnosis not present

## 2019-05-13 DIAGNOSIS — F909 Attention-deficit hyperactivity disorder, unspecified type: Secondary | ICD-10-CM | POA: Diagnosis not present

## 2019-05-13 DIAGNOSIS — F411 Generalized anxiety disorder: Secondary | ICD-10-CM

## 2019-05-14 ENCOUNTER — Ambulatory Visit: Payer: 59 | Admitting: Psychology

## 2019-05-20 ENCOUNTER — Ambulatory Visit: Payer: Self-pay | Admitting: Psychology

## 2019-05-24 MED FILL — JULEBER 0.15-30 MG-MCG TABS: 0.15-30 | 84 days supply | Qty: 84 | Fill #2

## 2019-05-24 MED FILL — valACYclovir HCL 1 GM TABS: 1 | 7 days supply | Qty: 14 | Fill #0

## 2019-06-02 ENCOUNTER — Encounter: Payer: Self-pay | Admitting: Physician Assistant

## 2019-06-02 ENCOUNTER — Telehealth: Payer: Self-pay

## 2019-06-02 ENCOUNTER — Ambulatory Visit (INDEPENDENT_AMBULATORY_CARE_PROVIDER_SITE_OTHER): Payer: BLUE CROSS/BLUE SHIELD | Admitting: Physician Assistant

## 2019-06-02 VITALS — BP 118/69 | HR 108 | Ht 62.0 in | Wt 179.0 lb

## 2019-06-02 DIAGNOSIS — M5442 Lumbago with sciatica, left side: Secondary | ICD-10-CM

## 2019-06-02 DIAGNOSIS — G8929 Other chronic pain: Secondary | ICD-10-CM | POA: Diagnosis not present

## 2019-06-02 MED ORDER — PREDNISONE 50 MG PO TABS: ORAL_TABLET | ORAL | 0 refills | Status: DC

## 2019-06-02 MED ORDER — CYCLOBENZAPRINE HCL 10 MG PO TABS: 10.0000 mg | ORAL_TABLET | Freq: Three times a day (TID) | ORAL | 0 refills | Status: DC | PRN

## 2019-06-02 MED ORDER — HYDROCODONE-ACETAMINOPHEN 5-325 MG PO TABS: 1.0000 | ORAL_TABLET | Freq: Four times a day (QID) | ORAL | 0 refills | Status: AC | PRN

## 2019-06-02 MED ORDER — HYDROCODONE-ACETAMINOPHEN 5-325 MG PO TABS: 1.0000 | ORAL_TABLET | Freq: Four times a day (QID) | ORAL | 0 refills | Status: DC | PRN

## 2019-06-02 NOTE — Telephone Encounter (Signed)
[facility] appt confirmed with pt. Advised to be here at 8 am.

## 2019-06-02 NOTE — Patient Instructions (Signed)
Kelsey Vaughan and Kelsey Vaughan [facility] on you tube  Low Back Sprain Rehab Ask your health care provider which exercises are safe for you. Do exercises exactly as told by your health care provider and adjust them as directed. It is normal to feel mild stretching, pulling, tightness, or discomfort as you do these exercises, but you should stop right away if you feel sudden pain or your pain gets worse. Do not begin these exercises until told by your health care provider. Stretching and range of motion exercises These exercises warm up your muscles and joints and improve the movement and flexibility of your back. These exercises also help to relieve pain, numbness, and tingling. Exercise A: Lumbar rotation  1. Lie on your back on a firm surface and bend your knees. 2. Straighten your arms out to your sides so each arm forms an "L" shape with a side of your body (a 90 degree angle). 3. Slowly move both of your knees to one side of your body until you feel a stretch in your lower back. Try not to let your shoulders move off of the floor. 4. Hold for __________ seconds. 5. Tense your abdominal muscles and slowly move your knees back to the starting position. 6. Repeat this exercise on the other side of your body. Repeat __________ times. Complete this exercise __________ times a day. Exercise B: Prone extension on elbows  1. Lie on your abdomen on a firm surface. 2. Prop yourself up on your elbows. 3. Use your arms to help lift your chest up until you feel a gentle stretch in your abdomen and your lower back. ? This will place some of your body weight on your elbows. If this is uncomfortable, try stacking pillows under your chest. ? Your hips should stay down, against the surface that you are lying on. Keep your hip and back muscles relaxed. 4. Hold for __________ seconds. 5. Slowly relax your upper body and return to the starting position. Repeat __________ times. Complete this exercise __________ times a day.  Strengthening exercises These exercises build strength and endurance in your back. Endurance is the ability to use your muscles for a long time, even after they get tired. Exercise C: Pelvic tilt 1. Lie on your back on a firm surface. Bend your knees and keep your feet flat. 2. Tense your abdominal muscles. Tip your pelvis up toward the ceiling and flatten your lower back into the floor. ? To help with this exercise, you may place a small towel under your lower back and try to push your back into the towel. 3. Hold for __________ seconds. 4. Let your muscles relax completely before you repeat this exercise. Repeat __________ times. Complete this exercise __________ times a day. Exercise D: Alternating arm and leg raises  1. Get on your hands and knees on a firm surface. If you are on a hard floor, you may want to use padding to cushion your knees, such as an exercise mat. 2. Line up your arms and legs. Your hands should be below your shoulders, and your knees should be below your hips. 3. Lift your left leg behind you. At the same time, raise your right arm and straighten it in front of you. ? Do not lift your leg higher than your hip. ? Do not lift your arm higher than your shoulder. ? Keep your abdominal and back muscles tight. ? Keep your hips facing the ground. ? Do not arch your back. ? Keep your balance carefully,  and do not hold your breath. 4. Hold for __________ seconds. 5. Slowly return to the starting position and repeat with your right leg and your left arm. Repeat __________ times. Complete this exercise __________ times a day. Exercise E: Abdominal set with straight leg raise  1. Lie on your back on a firm surface. 2. Bend one of your knees and keep your other leg straight. 3. Tense your abdominal muscles and lift your straight leg up, 4-6 inches (10-15 cm) off the ground. 4. Keep your abdominal muscles tight and hold for __________ seconds. ? Do not hold your breath. ?  Do not arch your back. Keep it flat against the ground. 5. Keep your abdominal muscles tense as you slowly lower your leg back to the starting position. 6. Repeat with your other leg. Repeat __________ times. Complete this exercise __________ times a day. Posture and body mechanics  Body mechanics refers to the movements and positions of your body while you do your daily activities. Posture is part of body mechanics. Good posture and healthy body mechanics can help to relieve stress in your body's tissues and joints. Good posture means that your spine is in its natural S-curve position (your spine is neutral), your shoulders are pulled back slightly, and your head is not tipped forward. The following are general guidelines for applying improved posture and body mechanics to your everyday activities. Standing   When standing, keep your spine neutral and your feet about hip-width apart. Keep a slight bend in your knees. Your ears, shoulders, and hips should line up.  When you do a task in which you stand in one place for a long time, place one foot up on a stable object that is 2-4 inches (5-10 cm) high, such as a footstool. This helps keep your spine neutral. Sitting   When sitting, keep your spine neutral and keep your feet flat on the floor. Use a footrest, if necessary, and keep your thighs parallel to the floor. Avoid rounding your shoulders, and avoid tilting your head forward.  When working at a desk or a computer, keep your desk at a height where your hands are slightly lower than your elbows. Slide your chair under your desk so you are close enough to maintain good posture.  When working at a computer, place your monitor at a height where you are looking straight ahead and you do not have to tilt your head forward or downward to look at the screen. Resting   When lying down and resting, avoid positions that are most painful for you.  If you have pain with activities such as sitting,  bending, stooping, or squatting (flexion-based activities), lie in a position in which your body does not bend very much. For example, avoid curling up on your side with your arms and knees near your chest (fetal position).  If you have pain with activities such as standing for a long time or reaching with your arms (extension-based activities), lie with your spine in a neutral position and bend your knees slightly. Try the following positions:  Lying on your side with a pillow between your knees.  Lying on your back with a pillow under your knees. Lifting   When lifting objects, keep your feet at least shoulder-width apart and tighten your abdominal muscles.  Bend your knees and hips and keep your spine neutral. It is important to lift using the strength of your legs, not your back. Do not lock your knees straight out.  Always ask for help to lift heavy or awkward objects. This information is not intended to replace advice given to you by your health care provider. Make sure you discuss any questions you have with your health care provider. Document Released: 12/02/2005 Document Revised: 08/08/2016 Document Reviewed: 09/13/2015 Elsevier Interactive Patient Education  2019 [employer].
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
## 2019-06-03 ENCOUNTER — Ambulatory Visit: Payer: 59 | Admitting: Physician Assistant

## 2019-06-07 ENCOUNTER — Encounter: Payer: 59 | Admitting: Neurology

## 2019-06-07 ENCOUNTER — Telehealth: Payer: Self-pay | Admitting: Neurology

## 2019-06-07 NOTE — Telephone Encounter (Signed)
This patient did not show for an EMG and nerve conduction study evaluation today. 

## 2019-06-08 DIAGNOSIS — M9904 Segmental and somatic dysfunction of sacral region: Secondary | ICD-10-CM | POA: Diagnosis not present

## 2019-06-08 DIAGNOSIS — M5137 Other intervertebral disc degeneration, lumbosacral region: Secondary | ICD-10-CM | POA: Diagnosis not present

## 2019-06-08 DIAGNOSIS — M9901 Segmental and somatic dysfunction of cervical region: Secondary | ICD-10-CM | POA: Diagnosis not present

## 2019-06-08 DIAGNOSIS — M791 Myalgia, unspecified site: Secondary | ICD-10-CM | POA: Diagnosis not present

## 2019-06-08 DIAGNOSIS — M531 Cervicobrachial syndrome: Secondary | ICD-10-CM | POA: Diagnosis not present

## 2019-06-08 DIAGNOSIS — M9902 Segmental and somatic dysfunction of thoracic region: Secondary | ICD-10-CM | POA: Diagnosis not present

## 2019-06-08 DIAGNOSIS — M9903 Segmental and somatic dysfunction of lumbar region: Secondary | ICD-10-CM | POA: Diagnosis not present

## 2019-06-09 DIAGNOSIS — M531 Cervicobrachial syndrome: Secondary | ICD-10-CM | POA: Diagnosis not present

## 2019-06-09 DIAGNOSIS — M5137 Other intervertebral disc degeneration, lumbosacral region: Secondary | ICD-10-CM | POA: Diagnosis not present

## 2019-06-09 DIAGNOSIS — M9904 Segmental and somatic dysfunction of sacral region: Secondary | ICD-10-CM | POA: Diagnosis not present

## 2019-06-09 DIAGNOSIS — M9901 Segmental and somatic dysfunction of cervical region: Secondary | ICD-10-CM | POA: Diagnosis not present

## 2019-06-09 DIAGNOSIS — M9903 Segmental and somatic dysfunction of lumbar region: Secondary | ICD-10-CM | POA: Diagnosis not present

## 2019-06-09 DIAGNOSIS — M791 Myalgia, unspecified site: Secondary | ICD-10-CM | POA: Diagnosis not present

## 2019-06-09 DIAGNOSIS — M9902 Segmental and somatic dysfunction of thoracic region: Secondary | ICD-10-CM | POA: Diagnosis not present

## 2019-06-11 DIAGNOSIS — M9902 Segmental and somatic dysfunction of thoracic region: Secondary | ICD-10-CM | POA: Diagnosis not present

## 2019-06-11 DIAGNOSIS — M9903 Segmental and somatic dysfunction of lumbar region: Secondary | ICD-10-CM | POA: Diagnosis not present

## 2019-06-11 DIAGNOSIS — M531 Cervicobrachial syndrome: Secondary | ICD-10-CM | POA: Diagnosis not present

## 2019-06-11 DIAGNOSIS — M791 Myalgia, unspecified site: Secondary | ICD-10-CM | POA: Diagnosis not present

## 2019-06-11 DIAGNOSIS — M9904 Segmental and somatic dysfunction of sacral region: Secondary | ICD-10-CM | POA: Diagnosis not present

## 2019-06-11 DIAGNOSIS — M9901 Segmental and somatic dysfunction of cervical region: Secondary | ICD-10-CM | POA: Diagnosis not present

## 2019-06-11 DIAGNOSIS — M5137 Other intervertebral disc degeneration, lumbosacral region: Secondary | ICD-10-CM | POA: Diagnosis not present

## 2019-06-14 ENCOUNTER — Telehealth: Payer: Self-pay | Admitting: Physician Assistant

## 2019-06-14 DIAGNOSIS — Z131 Encounter for screening for diabetes mellitus: Secondary | ICD-10-CM

## 2019-06-14 DIAGNOSIS — Z13220 Encounter for screening for lipoid disorders: Secondary | ICD-10-CM

## 2019-06-14 DIAGNOSIS — E6609 Other obesity due to excess calories: Secondary | ICD-10-CM

## 2019-06-14 DIAGNOSIS — E559 Vitamin D deficiency, unspecified: Secondary | ICD-10-CM

## 2019-06-14 DIAGNOSIS — R7301 Impaired fasting glucose: Secondary | ICD-10-CM

## 2019-06-14 DIAGNOSIS — Z8639 Personal history of other endocrine, nutritional and metabolic disease: Secondary | ICD-10-CM

## 2019-06-14 DIAGNOSIS — Z79899 Other long term (current) drug therapy: Secondary | ICD-10-CM

## 2019-06-14 DIAGNOSIS — E781 Pure hyperglyceridemia: Secondary | ICD-10-CM

## 2019-06-14 DIAGNOSIS — Z1329 Encounter for screening for other suspected endocrine disorder: Secondary | ICD-10-CM

## 2019-06-14 DIAGNOSIS — E66811 Obesity, class 1: Secondary | ICD-10-CM

## 2019-06-14 MED ORDER — AMBULATORY NON FORMULARY MEDICATION: 0 refills | Status: DC

## 2019-06-14 MED ORDER — SAXENDA 18 MG/3ML ~~LOC~~ SOPN: 0.6000 mg | PEN_INJECTOR | Freq: Every day | SUBCUTANEOUS | 0 refills | Status: DC

## 2019-06-14 NOTE — Telephone Encounter (Signed)
Received fax from [employer] that Saxenda requires a PA. Information has been sent to the insurance company. Awaiting determination.   

## 2019-06-14 NOTE — Telephone Encounter (Signed)
Pt wanted to restart saxenda. Sent refills. Will schedule cpe for follow up.

## 2019-06-15 DIAGNOSIS — M5137 Other intervertebral disc degeneration, lumbosacral region: Secondary | ICD-10-CM | POA: Diagnosis not present

## 2019-06-15 DIAGNOSIS — M9903 Segmental and somatic dysfunction of lumbar region: Secondary | ICD-10-CM | POA: Diagnosis not present

## 2019-06-15 DIAGNOSIS — M9902 Segmental and somatic dysfunction of thoracic region: Secondary | ICD-10-CM | POA: Diagnosis not present

## 2019-06-15 DIAGNOSIS — M9904 Segmental and somatic dysfunction of sacral region: Secondary | ICD-10-CM | POA: Diagnosis not present

## 2019-06-15 DIAGNOSIS — M9901 Segmental and somatic dysfunction of cervical region: Secondary | ICD-10-CM | POA: Diagnosis not present

## 2019-06-15 DIAGNOSIS — M531 Cervicobrachial syndrome: Secondary | ICD-10-CM | POA: Diagnosis not present

## 2019-06-15 DIAGNOSIS — M791 Myalgia, unspecified site: Secondary | ICD-10-CM | POA: Diagnosis not present

## 2019-06-15 NOTE — Addendum Note (Signed)
Addended byAnnamaria Helling on: 06/15/2019 09:11 AM   Modules accepted: Orders

## 2019-06-15 NOTE — Telephone Encounter (Signed)
Labs ordered for [facility] ?

## 2019-06-16 DIAGNOSIS — M791 Myalgia, unspecified site: Secondary | ICD-10-CM | POA: Diagnosis not present

## 2019-06-16 DIAGNOSIS — M531 Cervicobrachial syndrome: Secondary | ICD-10-CM | POA: Diagnosis not present

## 2019-06-16 DIAGNOSIS — M9904 Segmental and somatic dysfunction of sacral region: Secondary | ICD-10-CM | POA: Diagnosis not present

## 2019-06-16 DIAGNOSIS — M9901 Segmental and somatic dysfunction of cervical region: Secondary | ICD-10-CM | POA: Diagnosis not present

## 2019-06-16 DIAGNOSIS — M9902 Segmental and somatic dysfunction of thoracic region: Secondary | ICD-10-CM | POA: Diagnosis not present

## 2019-06-16 DIAGNOSIS — M9903 Segmental and somatic dysfunction of lumbar region: Secondary | ICD-10-CM | POA: Diagnosis not present

## 2019-06-16 DIAGNOSIS — M5137 Other intervertebral disc degeneration, lumbosacral region: Secondary | ICD-10-CM | POA: Diagnosis not present

## 2019-06-16 MED FILL — SAXENDA 18 MG/3 ML PEN: 18 | 30 days supply | Qty: 15 | Fill #0

## 2019-06-16 MED FILL — TECHLITE PEN NDL 32GX1/4: 32G X 6 MM | 90 days supply | Qty: 100 | Fill #0

## 2019-06-16 MED FILL — TECHLITE PEN NDL 32GX1/4": 32G X 6 MM | 90 days supply | Qty: 100 | Fill #0

## 2019-06-21 ENCOUNTER — Ambulatory Visit: Payer: 59 | Admitting: Psychology

## 2019-06-21 DIAGNOSIS — M9904 Segmental and somatic dysfunction of sacral region: Secondary | ICD-10-CM | POA: Diagnosis not present

## 2019-06-21 DIAGNOSIS — M9902 Segmental and somatic dysfunction of thoracic region: Secondary | ICD-10-CM | POA: Diagnosis not present

## 2019-06-21 DIAGNOSIS — M9903 Segmental and somatic dysfunction of lumbar region: Secondary | ICD-10-CM | POA: Diagnosis not present

## 2019-06-21 DIAGNOSIS — M791 Myalgia, unspecified site: Secondary | ICD-10-CM | POA: Diagnosis not present

## 2019-06-21 DIAGNOSIS — M531 Cervicobrachial syndrome: Secondary | ICD-10-CM | POA: Diagnosis not present

## 2019-06-21 DIAGNOSIS — M5137 Other intervertebral disc degeneration, lumbosacral region: Secondary | ICD-10-CM | POA: Diagnosis not present

## 2019-06-21 DIAGNOSIS — M9901 Segmental and somatic dysfunction of cervical region: Secondary | ICD-10-CM | POA: Diagnosis not present

## 2019-06-23 DIAGNOSIS — M9904 Segmental and somatic dysfunction of sacral region: Secondary | ICD-10-CM | POA: Diagnosis not present

## 2019-06-23 DIAGNOSIS — M9903 Segmental and somatic dysfunction of lumbar region: Secondary | ICD-10-CM | POA: Diagnosis not present

## 2019-06-23 DIAGNOSIS — M791 Myalgia, unspecified site: Secondary | ICD-10-CM | POA: Diagnosis not present

## 2019-06-23 DIAGNOSIS — M5137 Other intervertebral disc degeneration, lumbosacral region: Secondary | ICD-10-CM | POA: Diagnosis not present

## 2019-06-23 DIAGNOSIS — M9901 Segmental and somatic dysfunction of cervical region: Secondary | ICD-10-CM | POA: Diagnosis not present

## 2019-06-23 DIAGNOSIS — M531 Cervicobrachial syndrome: Secondary | ICD-10-CM | POA: Diagnosis not present

## 2019-06-23 DIAGNOSIS — M9902 Segmental and somatic dysfunction of thoracic region: Secondary | ICD-10-CM | POA: Diagnosis not present

## 2019-06-30 DIAGNOSIS — M531 Cervicobrachial syndrome: Secondary | ICD-10-CM | POA: Diagnosis not present

## 2019-06-30 DIAGNOSIS — M9903 Segmental and somatic dysfunction of lumbar region: Secondary | ICD-10-CM | POA: Diagnosis not present

## 2019-06-30 DIAGNOSIS — M791 Myalgia, unspecified site: Secondary | ICD-10-CM | POA: Diagnosis not present

## 2019-06-30 DIAGNOSIS — M9901 Segmental and somatic dysfunction of cervical region: Secondary | ICD-10-CM | POA: Diagnosis not present

## 2019-06-30 DIAGNOSIS — M5137 Other intervertebral disc degeneration, lumbosacral region: Secondary | ICD-10-CM | POA: Diagnosis not present

## 2019-06-30 DIAGNOSIS — M9904 Segmental and somatic dysfunction of sacral region: Secondary | ICD-10-CM | POA: Diagnosis not present

## 2019-06-30 DIAGNOSIS — M9902 Segmental and somatic dysfunction of thoracic region: Secondary | ICD-10-CM | POA: Diagnosis not present

## 2019-06-30 NOTE — Telephone Encounter (Signed)
Message from plan: The request has been approved. The authorization is effective for a maximum of 12 fills from 06/14/2019 to 06/12/2020, as long as the member is enrolled in their current health plan. The request was reviewed and approved by a licensed clinical pharmacist. This request is approved with the following quantity limits: 26mL per 30 days. A written notification letter will follow with additional details. Pharmacy aware.

## 2019-07-05 DIAGNOSIS — M531 Cervicobrachial syndrome: Secondary | ICD-10-CM | POA: Diagnosis not present

## 2019-07-05 DIAGNOSIS — M5137 Other intervertebral disc degeneration, lumbosacral region: Secondary | ICD-10-CM | POA: Diagnosis not present

## 2019-07-05 DIAGNOSIS — M9901 Segmental and somatic dysfunction of cervical region: Secondary | ICD-10-CM | POA: Diagnosis not present

## 2019-07-05 DIAGNOSIS — M9904 Segmental and somatic dysfunction of sacral region: Secondary | ICD-10-CM | POA: Diagnosis not present

## 2019-07-05 DIAGNOSIS — M9903 Segmental and somatic dysfunction of lumbar region: Secondary | ICD-10-CM | POA: Diagnosis not present

## 2019-07-05 DIAGNOSIS — M9902 Segmental and somatic dysfunction of thoracic region: Secondary | ICD-10-CM | POA: Diagnosis not present

## 2019-07-05 DIAGNOSIS — M791 Myalgia, unspecified site: Secondary | ICD-10-CM | POA: Diagnosis not present

## 2019-07-14 DIAGNOSIS — M9904 Segmental and somatic dysfunction of sacral region: Secondary | ICD-10-CM | POA: Diagnosis not present

## 2019-07-14 DIAGNOSIS — M791 Myalgia, unspecified site: Secondary | ICD-10-CM | POA: Diagnosis not present

## 2019-07-14 DIAGNOSIS — M9902 Segmental and somatic dysfunction of thoracic region: Secondary | ICD-10-CM | POA: Diagnosis not present

## 2019-07-14 DIAGNOSIS — M9903 Segmental and somatic dysfunction of lumbar region: Secondary | ICD-10-CM | POA: Diagnosis not present

## 2019-07-14 DIAGNOSIS — M5137 Other intervertebral disc degeneration, lumbosacral region: Secondary | ICD-10-CM | POA: Diagnosis not present

## 2019-07-14 DIAGNOSIS — M531 Cervicobrachial syndrome: Secondary | ICD-10-CM | POA: Diagnosis not present

## 2019-07-14 DIAGNOSIS — M9901 Segmental and somatic dysfunction of cervical region: Secondary | ICD-10-CM | POA: Diagnosis not present

## 2019-07-28 DIAGNOSIS — M9901 Segmental and somatic dysfunction of cervical region: Secondary | ICD-10-CM | POA: Diagnosis not present

## 2019-07-28 DIAGNOSIS — M531 Cervicobrachial syndrome: Secondary | ICD-10-CM | POA: Diagnosis not present

## 2019-07-28 DIAGNOSIS — M9904 Segmental and somatic dysfunction of sacral region: Secondary | ICD-10-CM | POA: Diagnosis not present

## 2019-07-28 DIAGNOSIS — M5137 Other intervertebral disc degeneration, lumbosacral region: Secondary | ICD-10-CM | POA: Diagnosis not present

## 2019-07-28 DIAGNOSIS — M9903 Segmental and somatic dysfunction of lumbar region: Secondary | ICD-10-CM | POA: Diagnosis not present

## 2019-07-28 DIAGNOSIS — M791 Myalgia, unspecified site: Secondary | ICD-10-CM | POA: Diagnosis not present

## 2019-07-28 DIAGNOSIS — M9902 Segmental and somatic dysfunction of thoracic region: Secondary | ICD-10-CM | POA: Diagnosis not present

## 2019-09-01 ENCOUNTER — Ambulatory Visit: Payer: BLUE CROSS/BLUE SHIELD | Admitting: Obstetrics and Gynecology

## 2019-09-13 ENCOUNTER — Ambulatory Visit: Payer: Self-pay

## 2019-09-13 ENCOUNTER — Ambulatory Visit (HOSPITAL_BASED_OUTPATIENT_CLINIC_OR_DEPARTMENT_OTHER)
Admission: RE | Admit: 2019-09-13 | Discharge: 2019-09-13 | Disposition: A | Discharge status: Home / Self Care | Payer: 59 | Source: Ambulatory Visit | Attending: Family Medicine | Admitting: Family Medicine

## 2019-09-13 ENCOUNTER — Ambulatory Visit (INDEPENDENT_AMBULATORY_CARE_PROVIDER_SITE_OTHER): Payer: BC Managed Care – PPO | Admitting: Family Medicine

## 2019-09-13 ENCOUNTER — Encounter: Payer: Self-pay | Admitting: Family Medicine

## 2019-09-13 ENCOUNTER — Other Ambulatory Visit: Payer: Self-pay

## 2019-09-13 VITALS — BP 123/79 | Ht 62.0 in | Wt 183.0 lb

## 2019-09-13 DIAGNOSIS — M75100 Unspecified rotator cuff tear or rupture of unspecified shoulder, not specified as traumatic: Secondary | ICD-10-CM | POA: Insufficient documentation

## 2019-09-13 DIAGNOSIS — M25511 Pain in right shoulder: Secondary | ICD-10-CM | POA: Diagnosis not present

## 2019-09-13 DIAGNOSIS — M75102 Unspecified rotator cuff tear or rupture of left shoulder, not specified as traumatic: Secondary | ICD-10-CM | POA: Insufficient documentation

## 2019-09-13 DIAGNOSIS — M67814 Other specified disorders of tendon, left shoulder: Secondary | ICD-10-CM | POA: Insufficient documentation

## 2019-09-13 MED ORDER — PREDNISONE 5 MG PO TABS: ORAL_TABLET | ORAL | 0 refills | Status: DC

## 2019-09-13 MED FILL — predniSONE 5 MG TABS: 5 | 6 days supply | Qty: 21 | Fill #0

## 2019-09-13 NOTE — Progress Notes (Signed)
Kelsey Vaughan - 43 y.o. female MRN [id number]  Date of birth: 06-Aug-1976  SUBJECTIVE:  Including CC & ROS.  Chief Complaint  Patient presents with  . Shoulder Injury    right shoulder x 2 weeks    Kelsey Vaughan is a 43 y.o. female that is presenting with acute right shoulder pain.  She was at work about a week and a half ago and a box fell and hit her on the lateral aspect of the shoulder.  She continued working since that time.  She has experienced ongoing throbbing in the lateral aspect of the shoulder.  She denies any weakness or loss of strength.  It feels different than the rotator cuff problems she had on the left side.  Pain is intermittent in nature.  Seems to be localized to the shoulder.  Can be throbbing at times.  Pain is mild to moderate in severity.   Review of Systems  Constitutional: Negative for fever.  HENT: Negative for congestion.   Respiratory: Negative for cough.   Cardiovascular: Negative for chest pain.  Gastrointestinal: Negative for abdominal pain.  Musculoskeletal: Negative for gait problem.  Skin: Negative for color change.  Neurological: Negative for weakness.  Hematological: Negative for adenopathy.  Psychiatric/Behavioral: Negative for agitation.    HISTORY: Past Medical, Surgical, Social, and Family History Reviewed & Updated per EMR.   Pertinent Historical Findings include:  Past Medical History:  Diagnosis Date  . Anxiety   . Atrial fibrillation ([place])    a. occuring in 02/2016  . B12 deficiency   . Constipation   . Foreign body in foot, left 07/2013  . GERD (gastroesophageal reflux disease)   . Hematuria   . Irregular menses    due to oral contraceptive  . Palpitations    a. 01/2017: Event monitor showing SR with PVC's.   . Sinus tachycardia   . Thyroid nodule   . Vitamin D deficiency     Past Surgical History:  Procedure Laterality Date  . CESAREAN SECTION    . FOREIGN BODY REMOVAL Left 08/05/2013   Procedure: LEFT FOOT  FOREIGN BODY REMOVAL ADULT;  Surgeon: Wylene Simmer, MD;  Location: [facility];  Service: Orthopedics;  Laterality: Left;  . SHOULDER ARTHROSCOPY Left   . TUBAL LIGATION      Allergies  Allergen Reactions  . Influenza Vaccines Hives and Rash  . Vyvanse [Lisdexamfetamine Dimesylate]     Headache/mood changes.   . Wellbutrin [Bupropion] Other (See Comments)    Possible nipple soreness/headaches/not feeling right    Family History  Problem Relation Age of Onset  . Diabetes Mother   . Hypertension Mother   . Kidney disease Mother   . CAD Father   . Breast cancer Neg Hx      Social History   Socioeconomic History  . Marital status: Divorced    Spouse name: Not on file  . Number of children: 3  . Years of education: Not on file  . Highest education level: Not on file  Occupational History  . 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 Financial resource strain: Not on file  . Food insecurity    Worry: Not on file    Inability: Not on file  . Transportation needs    Medical: Not on file    Non-medical: Not on file  Tobacco Use  . Smoking status: Current Every Day Smoker    Years: 6.00    Types:  Cigarettes  . Smokeless tobacco: Never Used  . Tobacco comment: 4-5 cigs/day  Substance and Sexual Activity  . Alcohol use: Yes    Comment: occasionally  . Drug use: No  . Sexual activity: Not on file    Comment: tubal ligation  Lifestyle  . Physical activity    Days per week: Not on file    Minutes per session: Not on file  . Stress: Not on file  Relationships  . Social Herbalist on phone: Not on file    Gets together: Not on file    Attends religious service: Not on file    Active member of club or organization: Not on file    Attends meetings of clubs or organizations: Not on file    Relationship status: Not on file  . Intimate partner violence    Fear of current or ex partner: Not on file     Emotionally abused: Not on file    Physically abused: Not on file    Forced sexual activity: Not on file  Other Topics Concern  . Not on file  Social History Narrative  . Not on file     PHYSICAL EXAM:  VS: BP 123/79   Ht 5\' 2"  (1.575 m)   Wt 183 lb (83 kg)   BMI 33.47 kg/m  Physical Exam Gen: NAD, alert, cooperative with exam, well-appearing ENT: normal lips, normal nasal mucosa,  Eye: normal EOM, normal conjunctiva and lids CV:  no edema, +2 pedal pulses   Resp: no accessory muscle use, non-labored,  Skin: no rashes, no areas of induration  Neuro: normal tone, normal sensation to touch Psych:  normal insight, alert and oriented MSK:  Right shoulder: No ecchymosis or swelling. Normal external rotation. Pain with external rotation and abduction. Positive empty can testing. Positive O'Brien's test. Neurovascular intact  Limited ultrasound: Right shoulder:  Biceps tendon has an effusion that is not encircling.  Appears to be deep to the tendon itself. Subscapularis is normal and static and dynamic testing. Supraspinatus with no significant changes. Posterior glenohumeral joint appears normal dynamic testing and static testing.  Summary: Changes observed with the biceps tendon could be a hematoma versus an effusion from the joint itself.  Ultrasound and interpretation by Clearance Coots, MD      ASSESSMENT & PLAN:   Acute pain of right shoulder Symptoms are after an injury sustained while at work.  Had a box hit her on the lateral aspect of the shoulder.  There appears to be a small effusion with the biceps tendon which could represent a hematoma versus an effusion from the joint. -X-ray. -Prednisone. -Counseled on home exercise therapy and supportive care. -Provided work note. -If no improvement consider joint injection versus physical therapy.

## 2019-09-13 NOTE — Assessment & Plan Note (Signed)
Symptoms are after an injury sustained while at work.  Had a box hit her on the lateral aspect of the shoulder.  There appears to be a small effusion with the biceps tendon which could represent a hematoma versus an effusion from the joint. -X-ray. -Prednisone. -Counseled on home exercise therapy and supportive care. -Provided work note. -If no improvement consider joint injection versus physical therapy.

## 2019-09-13 NOTE — Patient Instructions (Signed)
Nice to meet you  Please try ice  Please try the exercises  I will call with the results from today  Please send me a message in MyChart with any questions or updates.  Please see me back in 3 weeks.   --Dr. Raeford Razor

## 2019-09-14 ENCOUNTER — Telehealth: Payer: Self-pay | Admitting: Family Medicine

## 2019-09-14 NOTE — Telephone Encounter (Signed)
Spoke with patient about her xray results.   Rosemarie Ax, MD [facility] 09/14/2019, 8:10 AM

## 2019-09-15 ENCOUNTER — Ambulatory Visit (INDEPENDENT_AMBULATORY_CARE_PROVIDER_SITE_OTHER): Payer: 59 | Admitting: Obstetrics and Gynecology

## 2019-09-15 ENCOUNTER — Encounter: Payer: Self-pay | Admitting: Obstetrics and Gynecology

## 2019-09-15 ENCOUNTER — Other Ambulatory Visit: Payer: Self-pay

## 2019-09-15 VITALS — BP 110/80 | HR 97 | Ht 62.0 in | Wt 174.0 lb

## 2019-09-15 DIAGNOSIS — Z1151 Encounter for screening for human papillomavirus (HPV): Secondary | ICD-10-CM

## 2019-09-15 DIAGNOSIS — Z01419 Encounter for gynecological examination (general) (routine) without abnormal findings: Secondary | ICD-10-CM | POA: Diagnosis not present

## 2019-09-15 DIAGNOSIS — Z124 Encounter for screening for malignant neoplasm of cervix: Secondary | ICD-10-CM

## 2019-09-15 NOTE — Addendum Note (Signed)
Addended by: Lyndal Rainbow on: 09/15/2019 10:39 AM   Modules accepted: Orders

## 2019-09-15 NOTE — Progress Notes (Signed)
GYNECOLOGY ANNUAL PREVENTATIVE CARE ENCOUNTER NOTE  History:     Kelsey Vaughan is a 43 y.o. [id number] female here for a routine annual gynecologic exam.  Current complaints: None.   Denies abnormal vaginal bleeding, discharge, pelvic pain, problems with intercourse or other gynecologic concerns.    Gynecologic History No LMP recorded. Patient has had an ablation. Contraception: OCP (estrogen/progesterone) Last Pap: 2019. Results were: normal with negative HPV Last mammogram: 2019. Results were: normal  Obstetric History OB History  Gravida Para Term Preterm AB Living  3 3 3     3   SAB TAB Ectopic Multiple Live Births               # Outcome Date GA Lbr Len/2nd Weight Sex Delivery Anes PTL Lv  3 Term           2 Term           1 Term             Past Medical History:  Diagnosis Date  . Anxiety   . Atrial fibrillation ([place])    a. occuring in 02/2016  . B12 deficiency   . Constipation   . Foreign body in foot, left 07/2013  . GERD (gastroesophageal reflux disease)   . Hematuria   . Irregular menses    due to oral contraceptive  . Palpitations    a. 01/2017: Event monitor showing SR with PVC's.   . Sinus tachycardia   . Thyroid nodule   . Vitamin D deficiency     Past Surgical History:  Procedure Laterality Date  . CESAREAN SECTION    . FOREIGN BODY REMOVAL Left 08/05/2013   Procedure: LEFT FOOT FOREIGN BODY REMOVAL ADULT;  Surgeon: Wylene Simmer, MD;  Location: [facility];  Service: Orthopedics;  Laterality: Left;  . SHOULDER ARTHROSCOPY Left   . TUBAL LIGATION      Current Outpatient Medications on File Prior to Visit  Medication Sig Dispense Refill  . AMBULATORY NON FORMULARY MEDICATION salonpass patches with lidocaine may change every 8 hours as needed for pain. 90 patch 0  . AMBULATORY NON FORMULARY MEDICATION novofine 24mm needles to go with saxenda pen. 100 each 0  . azelastine (ASTELIN) 0.1 % nasal spray Place 1-2 sprays into both nostrils  2 (two) times daily. 30 mL 2  . Boric Acid CRYS 600mg  pv twice weekly for BV prevention. 125 g 0  . cyclobenzaprine (FLEXERIL) 10 MG tablet Take 1 tablet (10 mg total) by mouth 3 (three) times daily as needed for muscle spasms. 30 tablet 0  . desogestrel-ethinyl estradiol (APRI,EMOQUETTE,SOLIA) 0.15-30 MG-MCG tablet Take 1 tablet by mouth daily. 3 Package 4  . diclofenac sodium (VOLTAREN) 1 % GEL Apply 4 g topically 4 (four) times daily. To affected joint. 100 g 11  . dimenhyDRINATE (DRAMAMINE) 50 MG tablet Take 1 tablet (50 mg total) by mouth every 8 (eight) hours as needed. 30 tablet 0  . levocetirizine (XYZAL) 5 MG tablet Take 1 tablet (5 mg total) by mouth every evening. 90 tablet 1  . Liraglutide -Weight Management (SAXENDA) 18 MG/3ML SOPN Inject 0.6 mg into the skin daily. For one week then increase by .6mg  weekly until reaches 3mg  daily.  Please include ultra fine needles 74mm 5 pen 0  . nebivolol (BYSTOLIC) 2.5 MG tablet Take 1 tablet (2.5 mg total) by mouth daily as needed (palpatations). 90 tablet 4  . predniSONE (DELTASONE) 5 MG tablet Take 6 pills for first  day, 5 pills second day, 4 pills third day, 3 pills fourth day, 2 pills the fifth day, and 1 pill sixth day. 21 tablet 0  . SUMAtriptan (IMITREX) 100 MG tablet Take 1 tablet (100 mg total) by mouth once as needed for up to 1 dose for migraine. May repeat in 2 hours if headache persists or recurs. 10 tablet 1  . valACYclovir (VALTREX) 1000 MG tablet Take 1 tablet (1,000 mg total) by mouth 2 (two) times daily. 14 tablet 11  . vortioxetine HBr (TRINTELLIX) 5 MG TABS tablet Take 1 tablet (5 mg total) by mouth daily. 30 tablet 1   No current facility-administered medications on file prior to visit.     Allergies  Allergen Reactions  . Influenza Vaccines Hives and Rash  . Vyvanse [Lisdexamfetamine Dimesylate]     Headache/mood changes.   . Wellbutrin [Bupropion] Other (See Comments)    Possible nipple soreness/headaches/not feeling  right    Social History:  reports that she has been smoking cigarettes. She has smoked for the past 6.00 years. She has never used smokeless tobacco. She reports current alcohol use. She reports that she does not use drugs.  Family History  Problem Relation Age of Onset  . Diabetes Mother   . Hypertension Mother   . Kidney disease Mother   . CAD Father   . Breast cancer Neg Hx     The following portions of the patient's history were reviewed and updated as appropriate: allergies, current medications, past family history, past medical history, past social history, past surgical history and problem list.  Review of Systems Pertinent items noted in HPI and remainder of comprehensive ROS otherwise negative.  Physical Exam:  BP 110/80   Pulse 97   Ht 5\' 2"  (1.575 m)   Wt 174 lb (78.9 kg)   BMI 31.83 kg/m  CONSTITUTIONAL: Well-developed, well-nourished female in no acute distress.  HENT:  Normocephalic, atraumatic, External right and left ear normal. Oropharynx is clear and moist EYES: Conjunctivae and EOM are normal. Pupils are equal, round, and reactive to light. No scleral icterus.  NECK: Normal range of motion, supple, no masses.  Normal thyroid.  SKIN: Skin is warm and dry. No rash noted. Not diaphoretic. No erythema. No pallor. MUSCULOSKELETAL: Normal range of motion. No tenderness.  No cyanosis, clubbing, or edema.  2+ distal pulses. NEUROLOGIC: Alert and oriented to person, place, and time. Normal reflexes, muscle tone coordination. No cranial nerve deficit noted. PSYCHIATRIC: Normal mood and affect. Normal behavior. Normal judgment and thought content. CARDIOVASCULAR: Normal heart rate noted, regular rhythm RESPIRATORY: Clear to auscultation bilaterally. Effort and breath sounds normal, no problems with respiration noted. BREASTS: Symmetric in size. No masses, skin changes, nipple drainage, or lymphadenopathy. ABDOMEN: Soft, normal bowel sounds, no distention noted.  No  tenderness, rebound or guarding.  PELVIC: Normal appearing external genitalia; normal appearing vaginal mucosa and cervix.  No abnormal discharge noted.  Pap smear obtained.  Normal uterine size, no other palpable masses, no uterine or adnexal tenderness.   Assessment and Plan:  1. Women's annual routine gynecological examination  - Pap today, last pap 1 year ago and it was normal. She requests yearly paps.  - HIV antibody (with reflex) - Flu shot refused, allergic to medication  Will follow up results of pap smear and manage accordingly. Mammogram scheduled Routine preventative health maintenance measures emphasized. Please refer to After Visit Summary for other counseling recommendations.     Takelia Urieta, Artist Pais, [place]  for [employer], [place]

## 2019-09-15 NOTE — Progress Notes (Signed)
Last pap 10/19/18-negative

## 2019-09-20 LAB — CYTOLOGY - PAP
Adequacy: ABSENT
Diagnosis: NEGATIVE
High risk HPV: NEGATIVE

## 2019-10-04 ENCOUNTER — Encounter: Payer: Self-pay | Admitting: Family Medicine

## 2019-10-04 ENCOUNTER — Ambulatory Visit (INDEPENDENT_AMBULATORY_CARE_PROVIDER_SITE_OTHER): Payer: Worker's Compensation | Admitting: Family Medicine

## 2019-10-04 ENCOUNTER — Other Ambulatory Visit: Payer: Self-pay

## 2019-10-04 VITALS — BP 124/84 | HR 99 | Ht 62.0 in | Wt 174.0 lb

## 2019-10-04 DIAGNOSIS — M25511 Pain in right shoulder: Secondary | ICD-10-CM | POA: Diagnosis not present

## 2019-10-04 MED ORDER — PENNSAID 2 % TD SOLN: 1.0000 "application " | Freq: Two times a day (BID) | TRANSDERMAL | 3 refills | Status: DC

## 2019-10-04 NOTE — Patient Instructions (Signed)
Good to see you Please try the rub on medicine   Please try ice  Physical therapy should give you a call  Please send me a message in MyChart with any questions or updates.  Please see me back in 4 weeks or sooner if needed.   --Dr. Raeford Razor

## 2019-10-04 NOTE — Assessment & Plan Note (Signed)
Pain seems to be more associated with the capsule as opposed to the rotator cuff.  Has good range of motion -Pennsaid and sample provided. -Referral to physical therapy. -If no improvement will consider either glenohumeral or subacromial injection.

## 2019-10-04 NOTE — Progress Notes (Signed)
Kelsey Vaughan - 43 y.o. female MRN [id number]  Date of birth: September 15, 1976  SUBJECTIVE:  Including CC & ROS.  Chief Complaint  Patient presents with  . Follow-up    follow up for right shoulder    Kelsey Vaughan is a 43 y.o. female that is following up for her right shoulder pain.  She had improvement while on the steroids but the pain is returned.  She has been on a lifting restriction at work but did have to do a repetitive movement over and over the last week.  It seems to exacerbated over the end of last week and over the weekend.  The pain is moderate to severe.  Is intermittent in nature.  Seems to be on the lateral aspect of the shoulder.  Denies any further trauma.  It is achy and sore..   Review of Systems  Constitutional: Negative for fever.  HENT: Negative for congestion.   Respiratory: Negative for cough.   Cardiovascular: Negative for chest pain.  Gastrointestinal: Negative for abdominal pain.  Musculoskeletal: Negative for back pain.  Neurological: Negative for weakness.  Hematological: Negative for adenopathy.    HISTORY: Past Medical, Surgical, Social, and Family History Reviewed & Updated per EMR.   Pertinent Historical Findings include:  Past Medical History:  Diagnosis Date  . Anxiety   . Atrial fibrillation ([place])    a. occuring in 02/2016  . B12 deficiency   . Constipation   . Foreign body in foot, left 07/2013  . GERD (gastroesophageal reflux disease)   . Hematuria   . Irregular menses    due to oral contraceptive  . Palpitations    a. 01/2017: Event monitor showing SR with PVC's.   . Sinus tachycardia   . Thyroid nodule   . Vitamin D deficiency     Past Surgical History:  Procedure Laterality Date  . CESAREAN SECTION    . FOREIGN BODY REMOVAL Left 08/05/2013   Procedure: LEFT FOOT FOREIGN BODY REMOVAL ADULT;  Surgeon: Wylene Simmer, MD;  Location: [facility];  Service: Orthopedics;  Laterality: Left;  . SHOULDER ARTHROSCOPY Left    . TUBAL LIGATION      Allergies  Allergen Reactions  . Influenza Vaccines Hives and Rash  . Vyvanse [Lisdexamfetamine Dimesylate]     Headache/mood changes.   . Wellbutrin [Bupropion] Other (See Comments)    Possible nipple soreness/headaches/not feeling right    Family History  Problem Relation Age of Onset  . Diabetes Mother   . Hypertension Mother   . Kidney disease Mother   . CAD Father   . Breast cancer Neg Hx      Social History   Socioeconomic History  . Marital status: Divorced    Spouse name: Not on file  . Number of children: 3  . Years of education: Not on file  . Highest education level: Not on file  Occupational History  . Occupation: Research scientist (life sciences): [employer]  . Financial resource strain: Not on file  . Food insecurity    Worry: Not on file    Inability: Not on file  . Transportation needs    Medical: Not on file    Non-medical: Not on file  Tobacco Use  . Smoking status: Current Every Day Smoker    Years: 6.00    Types: Cigarettes  . Smokeless tobacco: Never Used  . Tobacco comment: 4-5 cigs/day  Substance and Sexual Activity  . Alcohol use: Yes  Comment: occasionally  . Drug use: No  . Sexual activity: Not on file    Comment: tubal ligation  Lifestyle  . Physical activity    Days per week: Not on file    Minutes per session: Not on file  . Stress: Not on file  Relationships  . Social Herbalist on phone: Not on file    Gets together: Not on file    Attends religious service: Not on file    Active member of club or organization: Not on file    Attends meetings of clubs or organizations: Not on file    Relationship status: Not on file  . Intimate partner violence    Fear of current or ex partner: Not on file    Emotionally abused: Not on file    Physically abused: Not on file    Forced sexual activity: Not on file  Other Topics Concern  . Not on file  Social History Narrative   . Not on file     PHYSICAL EXAM:  VS: BP 124/84   Pulse 99   Ht 5\' 2"  (1.575 m)   Wt 174 lb (78.9 kg)   BMI 31.83 kg/m  Physical Exam Gen: NAD, alert, cooperative with exam, well-appearing ENT: normal lips, normal nasal mucosa,  Eye: normal EOM, normal conjunctiva and lids CV:  no edema, +2 pedal pulses   Resp: no accessory muscle use, non-labored,  Skin: no rashes, no areas of induration  Neuro: normal tone, normal sensation to touch Psych:  normal insight, alert and oriented MSK:  Right shoulder: Normal active flexion and abduction. Normal external rotation. Pain with external rotation and abduction. Some pain with empty can testing. Positive O'Brien's test. Negative speeds test. Neurovascularly intact     ASSESSMENT & PLAN:   Acute pain of right shoulder Pain seems to be more associated with the capsule as opposed to the rotator cuff.  Has good range of motion -Pennsaid and sample provided. -Referral to physical therapy. -If no improvement will consider either glenohumeral or subacromial injection.

## 2019-10-04 NOTE — Progress Notes (Signed)
Medication Samples have been provided to the patient.  Drug name: Pennsaid       Strength: 2%       Qty: 1 Box  LOT: [id number]  Exp.Date: 07/2020  Dosing instructions: Use a peasize amount and rub gently.  The patient has been instructed regarding the correct time, dose, and frequency of taking this medication, including desired effects and most common side effects.   Sherrie George, MA 3:21 PM 10/04/2019

## 2019-10-11 ENCOUNTER — Other Ambulatory Visit: Payer: Self-pay | Admitting: Physician Assistant

## 2019-10-11 DIAGNOSIS — M6281 Muscle weakness (generalized): Secondary | ICD-10-CM | POA: Diagnosis not present

## 2019-10-11 DIAGNOSIS — M25611 Stiffness of right shoulder, not elsewhere classified: Secondary | ICD-10-CM | POA: Diagnosis not present

## 2019-10-11 DIAGNOSIS — M25511 Pain in right shoulder: Secondary | ICD-10-CM | POA: Diagnosis not present

## 2019-10-11 DIAGNOSIS — Z3009 Encounter for other general counseling and advice on contraception: Secondary | ICD-10-CM

## 2019-10-11 MED FILL — JULEBER 0.15-30 MG-MCG TABS: 0.15-30 | 84 days supply | Qty: 84 | Fill #0

## 2019-10-11 NOTE — Telephone Encounter (Signed)
Not on medication list  Please advise

## 2019-10-13 DIAGNOSIS — M25611 Stiffness of right shoulder, not elsewhere classified: Secondary | ICD-10-CM | POA: Diagnosis not present

## 2019-10-13 DIAGNOSIS — M25511 Pain in right shoulder: Secondary | ICD-10-CM | POA: Diagnosis not present

## 2019-10-13 DIAGNOSIS — M6281 Muscle weakness (generalized): Secondary | ICD-10-CM | POA: Diagnosis not present

## 2019-10-14 ENCOUNTER — Other Ambulatory Visit: Payer: Self-pay

## 2019-10-14 ENCOUNTER — Ambulatory Visit
Admission: RE | Admit: 2019-10-14 | Discharge: 2019-10-14 | Disposition: A | Discharge status: Home / Self Care | Payer: 59 | Source: Ambulatory Visit | Attending: Physician Assistant | Admitting: Physician Assistant

## 2019-10-14 DIAGNOSIS — Z1231 Encounter for screening mammogram for malignant neoplasm of breast: Secondary | ICD-10-CM

## 2019-10-15 NOTE — Progress Notes (Signed)
Normal mammogram. Follow up in 1 year.

## 2019-10-18 DIAGNOSIS — M6281 Muscle weakness (generalized): Secondary | ICD-10-CM | POA: Diagnosis not present

## 2019-10-18 DIAGNOSIS — M25511 Pain in right shoulder: Secondary | ICD-10-CM | POA: Diagnosis not present

## 2019-10-18 DIAGNOSIS — M25611 Stiffness of right shoulder, not elsewhere classified: Secondary | ICD-10-CM | POA: Diagnosis not present

## 2019-10-19 ENCOUNTER — Encounter: Payer: 59 | Admitting: Physician Assistant

## 2019-10-20 ENCOUNTER — Other Ambulatory Visit: Payer: Self-pay

## 2019-10-20 ENCOUNTER — Ambulatory Visit (INDEPENDENT_AMBULATORY_CARE_PROVIDER_SITE_OTHER): Payer: 59 | Admitting: Physician Assistant

## 2019-10-20 VITALS — BP 125/80 | HR 118 | Ht 62.0 in | Wt 181.0 lb

## 2019-10-20 DIAGNOSIS — I491 Atrial premature depolarization: Secondary | ICD-10-CM | POA: Diagnosis not present

## 2019-10-20 DIAGNOSIS — F17200 Nicotine dependence, unspecified, uncomplicated: Secondary | ICD-10-CM | POA: Diagnosis not present

## 2019-10-20 DIAGNOSIS — Z114 Encounter for screening for human immunodeficiency virus [HIV]: Secondary | ICD-10-CM | POA: Diagnosis not present

## 2019-10-20 DIAGNOSIS — Z13220 Encounter for screening for lipoid disorders: Secondary | ICD-10-CM | POA: Diagnosis not present

## 2019-10-20 DIAGNOSIS — Z131 Encounter for screening for diabetes mellitus: Secondary | ICD-10-CM

## 2019-10-20 DIAGNOSIS — I479 Paroxysmal tachycardia, unspecified: Secondary | ICD-10-CM | POA: Diagnosis not present

## 2019-10-20 DIAGNOSIS — Z0000 Encounter for general adult medical examination without abnormal findings: Secondary | ICD-10-CM | POA: Diagnosis not present

## 2019-10-20 DIAGNOSIS — E559 Vitamin D deficiency, unspecified: Secondary | ICD-10-CM

## 2019-10-20 DIAGNOSIS — F419 Anxiety disorder, unspecified: Secondary | ICD-10-CM

## 2019-10-20 NOTE — Progress Notes (Signed)
Subjective:     Kelsey Vaughan is a 43 y.o. female and is here for a comprehensive physical exam. The patient reports no problems.  Pt is concerned about her overall cardiovascular health. Her sister just had a stent put in and father died at 53 with MI.   Social History   Socioeconomic History  . Marital status: Divorced    Spouse name: Not on file  . Number of children: 3  . Years of education: Not on file  . Highest education level: Not on file  Occupational History  . Occupation: Research scientist (life sciences): [employer]  . Financial resource strain: Not on file  . Food insecurity    Worry: Not on file    Inability: Not on file  . Transportation needs    Medical: Not on file    Non-medical: Not on file  Tobacco Use  . Smoking status: Current Every Day Smoker    Years: 6.00    Types: Cigarettes  . Smokeless tobacco: Never Used  . Tobacco comment: 4-5 cigs/day  Substance and Sexual Activity  . Alcohol use: Yes    Comment: occasionally  . Drug use: No  . Sexual activity: Not on file    Comment: tubal ligation  Lifestyle  . Physical activity    Days per week: Not on file    Minutes per session: Not on file  . Stress: Not on file  Relationships  . Social Herbalist on phone: Not on file    Gets together: Not on file    Attends religious service: Not on file    Active member of club or organization: Not on file    Attends meetings of clubs or organizations: Not on file    Relationship status: Not on file  . Intimate partner violence    Fear of current or ex partner: Not on file    Emotionally abused: Not on file    Physically abused: Not on file    Forced sexual activity: Not on file  Other Topics Concern  . Not on file  Social History Narrative  . Not on file   Health Maintenance  Topic Date Due  . MAMMOGRAM  10/13/2020  . PAP SMEAR-Modifier  09/14/2022  . TETANUS/TDAP  04/29/2024  . HIV Screening  Completed  .  INFLUENZA VACCINE  Discontinued    The following portions of the patient's history were reviewed and updated as appropriate: allergies, current medications, past family history, past medical history, past social history, past surgical history and problem list.  Review of Systems A comprehensive review of systems was negative.   Objective:    BP 125/80   Pulse (!) 118   Ht 5\' 2"  (1.575 m)   Wt 181 lb (82.1 kg)   SpO2 100%   BMI 33.11 kg/m  General appearance: alert, cooperative, appears stated age and moderate distress Head: Normocephalic, without obvious abnormality, atraumatic Eyes: conjunctivae/corneas clear. PERRL, EOM's intact. Fundi benign. Ears: normal TM's and external ear canals both ears Nose: Nares normal. Septum midline. Mucosa normal. No drainage or sinus tenderness. Throat: lips, mucosa, and tongue normal; teeth and gums normal Neck: no adenopathy, no carotid bruit, no JVD, supple, symmetrical, trachea midline and thyroid not enlarged, symmetric, no tenderness/mass/nodules Back: symmetric, no curvature. ROM normal. No CVA tenderness. Lungs: clear to auscultation bilaterally Heart: regular rate and rhythm and tachycardia with skipped beats heard.  Abdomen: soft, non-tender; bowel sounds normal; no  masses,  no organomegaly Extremities: extremities normal, atraumatic, no cyanosis or edema Pulses: 2+ and symmetric Skin: Skin color, texture, turgor normal. No rashes or lesions Lymph nodes: Cervical, supraclavicular, and axillary nodes normal. Neurologic: Alert and oriented X 3, normal strength and tone. Normal symmetric reflexes. Normal coordination and gait    Assessment:    Healthy female exam.      Plan:    [name] KitchenMarland KitchenLexius was seen today for annual exam.  Diagnoses and all orders for this visit:  Routine physical examination -     Lipid Panel w/reflex Direct LDL -     COMPLETE METABOLIC PANEL WITH GFR -     VITAMIN D 25 Hydroxy (Vit-D Deficiency, Fractures) -      TSH -     HIV antibody (with reflex) -     Lipase  Screening for diabetes mellitus -     COMPLETE METABOLIC PANEL WITH GFR  Screening for lipid disorders -     Lipid Panel w/reflex Direct LDL  Vitamin D deficiency -     VITAMIN D 25 Hydroxy (Vit-D Deficiency, Fractures)  Anxiety -     VITAMIN D 25 Hydroxy (Vit-D Deficiency, Fractures) -     TSH  Encounter for screening for HIV -     HIV antibody (with reflex)  Current smoker  Tachycardia, paroxysmal (HCC)  PAC (premature atrial contraction)   .[name] Kitchen Depression screen PHQ 2/9 10/20/2019  Decreased Interest 1  Down, Depressed, Hopeless 1  PHQ - 2 Score 2  Altered sleeping 0  Tired, decreased energy 0  Change in appetite 0  Feeling bad or failure about yourself  0  Trouble concentrating 0  Moving slowly or fidgety/restless 0  Suicidal thoughts 0  PHQ-9 Score 2  Difficult doing work/chores Not difficult at all  Some encounter information is confidential and restricted. Go to Review Flowsheets activity to see all data.  Some recent data might be hidden   .[name] Kitchen Discussed 150 minutes of exercise a week.  Encouraged vitamin D 1000 units and Calcium 1300mg  or 4 servings of dairy a day.  Fasting labs ordered.  Mammogram UTD.  Pap UTD. Pt allergic to flu shot.  Fasting labs ordered.   BP looks good. HR elevated. Pt not taking BB. Discussed importance of taking. Discuss CV risk. Needs labs. Pt has cardiologist. Discussed smoking cessation to decrease CV risk. Pt declined today.   See After Visit Summary for Counseling Recommendations

## 2019-10-20 NOTE — Patient Instructions (Signed)
Health Maintenance, Female Adopting a healthy lifestyle and getting preventive care are important in promoting health and wellness. Ask your health care provider about:  The right schedule for you to have regular tests and exams.  Things you can do on your own to prevent diseases and keep yourself healthy. What should I know about diet, weight, and exercise? Eat a healthy diet   Eat a diet that includes plenty of vegetables, fruits, low-fat dairy products, and lean protein.  Do not eat a lot of foods that are high in solid fats, added sugars, or sodium. Maintain a healthy weight Body mass index (BMI) is used to identify weight problems. It estimates body fat based on height and weight. Your health care provider can help determine your BMI and help you achieve or maintain a healthy weight. Get regular exercise Get regular exercise. This is one of the most important things you can do for your health. Most adults should:  Exercise for at least 150 minutes each week. The exercise should increase your heart rate and make you sweat (moderate-intensity exercise).  Do strengthening exercises at least twice a week. This is in addition to the moderate-intensity exercise.  Spend less time sitting. Even light physical activity can be beneficial. Watch cholesterol and blood lipids Have your blood tested for lipids and cholesterol at 43 years of age, then have this test every 5 years. Have your cholesterol levels checked more often if:  Your lipid or cholesterol levels are high.  You are older than 43 years of age.  You are at high risk for heart disease. What should I know about cancer screening? Depending on your health history and family history, you may need to have cancer screening at various ages. This may include screening for:  Breast cancer.  Cervical cancer.  Colorectal cancer.  Skin cancer.  Lung cancer. What should I know about heart disease, diabetes, and high blood  pressure? Blood pressure and heart disease  High blood pressure causes heart disease and increases the risk of stroke. This is more likely to develop in people who have high blood pressure readings, are of African descent, or are overweight.  Have your blood pressure checked: ? Every 3-5 years if you are 18-39 years of age. ? Every year if you are 40 years old or older. Diabetes Have regular diabetes screenings. This checks your fasting blood sugar level. Have the screening done:  Once every three years after age 40 if you are at a normal weight and have a low risk for diabetes.  More often and at a younger age if you are overweight or have a high risk for diabetes. What should I know about preventing infection? Hepatitis B If you have a higher risk for hepatitis B, you should be screened for this virus. Talk with your health care provider to find out if you are at risk for hepatitis B infection. Hepatitis C Testing is recommended for:  Everyone born from 1945 through 1965.  Anyone with known risk factors for hepatitis C. Sexually transmitted infections (STIs)  Get screened for STIs, including gonorrhea and chlamydia, if: ? You are sexually active and are younger than 43 years of age. ? You are older than 43 years of age and your health care provider tells you that you are at risk for this type of infection. ? Your sexual activity has changed since you were last screened, and you are at increased risk for chlamydia or gonorrhea. Ask your health care provider if   you are at risk.  Ask your health care provider about whether you are at high risk for HIV. Your health care provider may recommend a prescription medicine to help prevent HIV infection. If you choose to take medicine to prevent HIV, you should first get tested for HIV. You should then be tested every 3 months for as long as you are taking the medicine. Pregnancy  If you are about to stop having your period (premenopausal) and  you may become pregnant, seek counseling before you get pregnant.  Take 400 to 800 micrograms (mcg) of folic acid every day if you become pregnant.  Ask for birth control (contraception) if you want to prevent pregnancy. Osteoporosis and menopause Osteoporosis is a disease in which the bones lose minerals and strength with aging. This can result in bone fractures. If you are 65 years old or older, or if you are at risk for osteoporosis and fractures, ask your health care provider if you should:  Be screened for bone loss.  Take a calcium or vitamin D supplement to lower your risk of fractures.  Be given hormone replacement therapy (HRT) to treat symptoms of menopause. Follow these instructions at home: Lifestyle  Do not use any products that contain nicotine or tobacco, such as cigarettes, e-cigarettes, and chewing tobacco. If you need help quitting, ask your health care provider.  Do not use street drugs.  Do not share needles.  Ask your health care provider for help if you need support or information about quitting drugs. Alcohol use  Do not drink alcohol if: ? Your health care provider tells you not to drink. ? You are pregnant, may be pregnant, or are planning to become pregnant.  If you drink alcohol: ? Limit how much you use to 0-1 drink a day. ? Limit intake if you are breastfeeding.  Be aware of how much alcohol is in your drink. In the [place] one drink equals one 12 oz bottle of beer (355 mL), one 5 oz glass of wine (148 mL), or one 1 oz glass of hard liquor (44 mL). General instructions  Schedule regular health, dental, and eye exams.  Stay current with your vaccines.  Tell your health care provider if: ? You often feel depressed. ? You have ever been abused or do not feel safe at home. Summary  Adopting a healthy lifestyle and getting preventive care are important in promoting health and wellness.  Follow your health care provider's instructions about healthy  diet, exercising, and getting tested or screened for diseases.  Follow your health care provider's instructions on monitoring your cholesterol and blood pressure. This information is not intended to replace advice given to you by your health care provider. Make sure you discuss any questions you have with your health care provider. Document Released: 06/17/2011 Document Revised: 11/25/2018 Document Reviewed: 11/25/2018 Elsevier Patient Education  2020 Elsevier Inc.  

## 2019-10-22 ENCOUNTER — Encounter: Payer: Self-pay | Admitting: Physician Assistant

## 2019-10-22 DIAGNOSIS — F17200 Nicotine dependence, unspecified, uncomplicated: Secondary | ICD-10-CM | POA: Insufficient documentation

## 2019-10-22 DIAGNOSIS — F419 Anxiety disorder, unspecified: Secondary | ICD-10-CM | POA: Insufficient documentation

## 2019-10-25 DIAGNOSIS — M25611 Stiffness of right shoulder, not elsewhere classified: Secondary | ICD-10-CM | POA: Diagnosis not present

## 2019-10-25 DIAGNOSIS — M6281 Muscle weakness (generalized): Secondary | ICD-10-CM | POA: Diagnosis not present

## 2019-10-25 DIAGNOSIS — M25511 Pain in right shoulder: Secondary | ICD-10-CM | POA: Diagnosis not present

## 2019-10-27 ENCOUNTER — Ambulatory Visit (HOSPITAL_BASED_OUTPATIENT_CLINIC_OR_DEPARTMENT_OTHER)
Admission: RE | Admit: 2019-10-27 | Discharge: 2019-10-27 | Disposition: A | Discharge status: Home / Self Care | Payer: 59 | Source: Ambulatory Visit | Attending: Family Medicine | Admitting: Family Medicine

## 2019-10-27 ENCOUNTER — Encounter: Payer: Self-pay | Admitting: Family Medicine

## 2019-10-27 ENCOUNTER — Other Ambulatory Visit: Payer: Self-pay

## 2019-10-27 ENCOUNTER — Ambulatory Visit (INDEPENDENT_AMBULATORY_CARE_PROVIDER_SITE_OTHER): Payer: 59 | Admitting: Family Medicine

## 2019-10-27 VITALS — BP 121/71 | Ht 62.0 in | Wt 184.0 lb

## 2019-10-27 DIAGNOSIS — M25562 Pain in left knee: Secondary | ICD-10-CM | POA: Insufficient documentation

## 2019-10-27 DIAGNOSIS — M222X2 Patellofemoral disorders, left knee: Secondary | ICD-10-CM

## 2019-10-27 MED ORDER — PREDNISONE 5 MG PO TABS: ORAL_TABLET | ORAL | 0 refills | Status: DC

## 2019-10-27 MED FILL — predniSONE 5 MG TABS: 5 | 6 days supply | Qty: 21 | Fill #0

## 2019-10-27 MED FILL — SUMATRIPTAN SUCC 100 MG TAB: 100 | 30 days supply | Qty: 9 | Fill #1

## 2019-10-27 NOTE — Progress Notes (Signed)
Kelsey Vaughan - 43 y.o. female MRN [id number]  Date of birth: [birth year]  SUBJECTIVE:  Including CC & ROS.  Chief Complaint  Patient presents with  . Knee Pain    left knee    Kelsey Vaughan is a 43 y.o. female that is presenting with acute left knee pain.  She started experiencing this pain the other night at work.  She was having to do repetitive bending of the left knee while she was working.  The pain is anterior in nature.  The pain is worse when she is getting from a seated position or with repetitive movements.  Denies any history of similar pain or surgery.  Pain can be moderate to severe.  Pain is intermittent in nature.  Pain can be sharp and stabbing.  Has not tried anything for the pain.   Review of Systems  Constitutional: Negative for fever.  HENT: Negative for congestion.   Respiratory: Negative for cough.   Cardiovascular: Negative for chest pain.  Gastrointestinal: Negative for abdominal pain.  Musculoskeletal: Positive for gait problem.  Skin: Negative for color change.  Neurological: Negative for weakness.  Hematological: Negative for adenopathy.    HISTORY: Past Medical, Surgical, Social, and Family History Reviewed & Updated per EMR.   Pertinent Historical Findings include:  Past Medical History:  Diagnosis Date  . Anxiety   . Atrial fibrillation ([place])    a. occuring in 02/2016  . B12 deficiency   . Constipation   . Foreign body in foot, left 07/2013  . GERD (gastroesophageal reflux disease)   . Hematuria   . Irregular menses    due to oral contraceptive  . Palpitations    a. 01/2017: Event monitor showing SR with PVC's.   . Sinus tachycardia   . Thyroid nodule   . Vitamin D deficiency     Past Surgical History:  Procedure Laterality Date  . CESAREAN SECTION    . FOREIGN BODY REMOVAL Left 08/05/2013   Procedure: LEFT FOOT FOREIGN BODY REMOVAL ADULT;  Surgeon: Wylene Simmer, MD;  Location: [facility];  Service: Orthopedics;   Laterality: Left;  . SHOULDER ARTHROSCOPY Left   . TUBAL LIGATION      Allergies  Allergen Reactions  . Influenza Vaccines Hives and Rash  . Vyvanse [Lisdexamfetamine Dimesylate]     Headache/mood changes.   . Wellbutrin [Bupropion] Other (See Comments)    Possible nipple soreness/headaches/not feeling right    Family History  Problem Relation Age of Onset  . Diabetes Mother   . Hypertension Mother   . Kidney disease Mother   . CAD Father   . Heart attack Father   . CAD Sister   . Breast cancer Neg Hx      Social History   Socioeconomic History  . Marital status: Divorced    Spouse name: Not on file  . Number of children: 3  . Years of education: Not on file  . Highest education level: Not on file  Occupational History  . Occupation: Research scientist (life sciences): [employer]  . Financial resource strain: Not on file  . Food insecurity    Worry: Not on file    Inability: Not on file  . Transportation needs    Medical: Not on file    Non-medical: Not on file  Tobacco Use  . Smoking status: Current Every Day Smoker    Years: 6.00    Types: Cigarettes  . Smokeless tobacco: Never Used  .  Tobacco comment: 4-5 cigs/day  Substance and Sexual Activity  . Alcohol use: Yes    Comment: occasionally  . Drug use: No  . Sexual activity: Not on file    Comment: tubal ligation  Lifestyle  . Physical activity    Days per week: Not on file    Minutes per session: Not on file  . Stress: Not on file  Relationships  . Social Herbalist on phone: Not on file    Gets together: Not on file    Attends religious service: Not on file    Active member of club or organization: Not on file    Attends meetings of clubs or organizations: Not on file    Relationship status: Not on file  . Intimate partner violence    Fear of current or ex partner: Not on file    Emotionally abused: Not on file    Physically abused: Not on file    Forced  sexual activity: Not on file  Other Topics Concern  . Not on file  Social History Narrative  . Not on file     PHYSICAL EXAM:  VS: BP 121/71   Ht 5\' 2"  (1.575 m)   Wt 184 lb (83.5 kg)   BMI 33.65 kg/m  Physical Exam Gen: NAD, alert, cooperative with exam, well-appearing ENT: normal lips, normal nasal mucosa,  Eye: normal EOM, normal conjunctiva and lids CV:  no edema, +2 pedal pulses   Resp: no accessory muscle use, non-labored,  Skin: no rashes, no areas of induration  Neuro: normal tone, normal sensation to touch Psych:  normal insight, alert and oriented MSK:  Left knee: No obvious effusion. Mild tenderness to palpation of the lateral joint line. No instability with valgus varus stress testing. Normal strength resistance. Normal range of motion. Positive McMurray's test. Some pain with patellar grind. Neurovascular intact     ASSESSMENT & PLAN:   Patellofemoral pain syndrome of left knee Pain seems to be patellofemoral in nature.  May have a bit of meniscal irritation as well.  Pain occurred while working. -Prednisone. -Patellar tracking brace. - xray -Counseled on home exercise therapy and supportive care. -Provided work note. -If no improvement consider injection

## 2019-10-27 NOTE — Patient Instructions (Signed)
Good to see you Please avoid going to hyperextension of the knee  Please try ice  Please try the exercises once you start feeling improved  Please send me a message in MyChart with any questions or updates.  Please see me back in 4 weeks.   --Dr. Raeford Razor

## 2019-10-27 NOTE — Assessment & Plan Note (Addendum)
Pain seems to be patellofemoral in nature.  May have a bit of meniscal irritation as well.  Pain occurred while working. -Prednisone. -Patellar tracking brace. - xray -Counseled on home exercise therapy and supportive care. -Provided work note. -If no improvement consider injection

## 2019-10-28 ENCOUNTER — Telehealth: Payer: Self-pay | Admitting: Family Medicine

## 2019-10-28 NOTE — Telephone Encounter (Signed)
Informed of xray results.   Rosemarie Ax, MD [facility] 10/28/2019, 8:22 AM

## 2019-10-29 DIAGNOSIS — M25511 Pain in right shoulder: Secondary | ICD-10-CM | POA: Diagnosis not present

## 2019-10-29 DIAGNOSIS — M6281 Muscle weakness (generalized): Secondary | ICD-10-CM | POA: Diagnosis not present

## 2019-10-29 DIAGNOSIS — M25611 Stiffness of right shoulder, not elsewhere classified: Secondary | ICD-10-CM | POA: Diagnosis not present

## 2019-11-01 ENCOUNTER — Other Ambulatory Visit: Payer: Self-pay

## 2019-11-01 ENCOUNTER — Ambulatory Visit (INDEPENDENT_AMBULATORY_CARE_PROVIDER_SITE_OTHER): Payer: Worker's Compensation | Admitting: Family Medicine

## 2019-11-01 ENCOUNTER — Encounter: Payer: Self-pay | Admitting: Family Medicine

## 2019-11-01 DIAGNOSIS — M25511 Pain in right shoulder: Secondary | ICD-10-CM

## 2019-11-01 NOTE — Progress Notes (Signed)
Kelsey Vaughan - 43 y.o. female MRN [id number]  Date of birth: [birth year]  SUBJECTIVE:  Including CC & ROS.  Chief Complaint  Patient presents with  . Follow-up    follow up for right shoulder    Kelsey Vaughan is a 43 y.o. female that is following up for the right shoulder pain.  She has been going to physical therapy and notices certain movements that exacerbate her pain.  She has had limited improvement with medications thus far.  Pain is still occurring when she is reaching behind her back.  Pain seems to be localized to the shoulder.  Did have some intermittent radicular type symptoms but this was only a couple times during her last shift.    Review of Systems  Constitutional: Negative for fever.  HENT: Negative for congestion.   Respiratory: Negative for cough.   Cardiovascular: Negative for chest pain.  Gastrointestinal: Negative for abdominal pain.  Musculoskeletal: Negative for back pain.  Skin: Negative for color change.  Neurological: Negative for weakness.  Hematological: Negative for adenopathy.    HISTORY: Past Medical, Surgical, Social, and Family History Reviewed & Updated per EMR.   Pertinent Historical Findings include:  Past Medical History:  Diagnosis Date  . Anxiety   . Atrial fibrillation ([place])    a. occuring in 02/2016  . B12 deficiency   . Constipation   . Foreign body in foot, left 07/2013  . GERD (gastroesophageal reflux disease)   . Hematuria   . Irregular menses    due to oral contraceptive  . Palpitations    a. 01/2017: Event monitor showing SR with PVC's.   . Sinus tachycardia   . Thyroid nodule   . Vitamin D deficiency     Past Surgical History:  Procedure Laterality Date  . CESAREAN SECTION    . FOREIGN BODY REMOVAL Left 08/05/2013   Procedure: LEFT FOOT FOREIGN BODY REMOVAL ADULT;  Surgeon: Wylene Simmer, MD;  Location: [facility];  Service: Orthopedics;  Laterality: Left;  . SHOULDER ARTHROSCOPY Left   . TUBAL  LIGATION      Allergies  Allergen Reactions  . Influenza Vaccines Hives and Rash  . Vyvanse [Lisdexamfetamine Dimesylate]     Headache/mood changes.   . Wellbutrin [Bupropion] Other (See Comments)    Possible nipple soreness/headaches/not feeling right    Family History  Problem Relation Age of Onset  . Diabetes Mother   . Hypertension Mother   . Kidney disease Mother   . CAD Father   . Heart attack Father   . CAD Sister   . Breast cancer Neg Hx      Social History   Socioeconomic History  . Marital status: Divorced    Spouse name: Not on file  . Number of children: 3  . Years of education: Not on file  . Highest education level: Not on file  Occupational History  . Occupation: Research scientist (life sciences): [employer]  . Financial resource strain: Not on file  . Food insecurity    Worry: Not on file    Inability: Not on file  . Transportation needs    Medical: Not on file    Non-medical: Not on file  Tobacco Use  . Smoking status: Current Every Day Smoker    Years: 6.00    Types: Cigarettes  . Smokeless tobacco: Never Used  . Tobacco comment: 4-5 cigs/day  Substance and Sexual Activity  . Alcohol use: Yes  Comment: occasionally  . Drug use: No  . Sexual activity: Not on file    Comment: tubal ligation  Lifestyle  . Physical activity    Days per week: Not on file    Minutes per session: Not on file  . Stress: Not on file  Relationships  . Social Herbalist on phone: Not on file    Gets together: Not on file    Attends religious service: Not on file    Active member of club or organization: Not on file    Attends meetings of clubs or organizations: Not on file    Relationship status: Not on file  . Intimate partner violence    Fear of current or ex partner: Not on file    Emotionally abused: Not on file    Physically abused: Not on file    Forced sexual activity: Not on file  Other Topics Concern  . Not on  file  Social History Narrative  . Not on file     PHYSICAL EXAM:  VS: BP 133/85   Ht 5\' 2"  (1.575 m)   Wt 184 lb (83.5 kg)   BMI 33.65 kg/m  Physical Exam Gen: NAD, alert, cooperative with exam, well-appearing ENT: normal lips, normal nasal mucosa,  Eye: normal EOM, normal conjunctiva and lids CV:  no edema, +2 pedal pulses   Resp: no accessory muscle use, non-labored,  Skin: no rashes, no areas of induration  Neuro: normal tone, normal sensation to touch Psych:  normal insight, alert and oriented MSK:  Right shoulder: Normal active flexion abduction. Normal strength resistance. Normal internal and external rotation. Positive empty can test. Positive Hawkins test. Some pain with speeds test. Neurovascularly intact     ASSESSMENT & PLAN:   Acute pain of right shoulder Pain is ongoing.  Initial injury occurred while at work in September.  Imaging has been negative thus far.  Has had little improvement with medications and physical therapy thus point. -MRI to evaluate for rotator cuff tear. -Counseled on supportive care. -Provided work note.

## 2019-11-01 NOTE — Patient Instructions (Signed)
Good to see you Please continue with physical therapy and avoid the movements that cause pain   Please send me a message in MyChart with any questions or updates.  We will set up a virtual visit once the MRI is completed to discuss the findings.   --Dr. Raeford Razor

## 2019-11-01 NOTE — Assessment & Plan Note (Signed)
Pain is ongoing.  Initial injury occurred while at work in September.  Imaging has been negative thus far.  Has had little improvement with medications and physical therapy thus point. -MRI to evaluate for rotator cuff tear. -Counseled on supportive care. -Provided work note.

## 2019-11-16 ENCOUNTER — Emergency Department (HOSPITAL_COMMUNITY)
Admission: EM | Admit: 2019-11-16 | Discharge: 2019-11-17 | Discharge status: Against Medical Advice | Payer: 59 | Attending: Emergency Medicine | Admitting: Emergency Medicine

## 2019-11-16 ENCOUNTER — Encounter (HOSPITAL_COMMUNITY): Payer: Self-pay

## 2019-11-16 DIAGNOSIS — J029 Acute pharyngitis, unspecified: Secondary | ICD-10-CM | POA: Diagnosis not present

## 2019-11-16 DIAGNOSIS — Z5321 Procedure and treatment not carried out due to patient leaving prior to being seen by health care provider: Secondary | ICD-10-CM | POA: Diagnosis not present

## 2019-11-16 NOTE — ED Notes (Signed)
Pt did not respond when called for vitals check 

## 2019-11-16 NOTE — ED Triage Notes (Signed)
Pt states that she was eating and got choked on some chicken, pt vomited several times and now her throat is sore, airway intact, no distress noted, pt has had this problem in the past

## 2019-11-17 ENCOUNTER — Encounter: Payer: Self-pay | Admitting: Physician Assistant

## 2019-11-17 ENCOUNTER — Ambulatory Visit (INDEPENDENT_AMBULATORY_CARE_PROVIDER_SITE_OTHER): Payer: 59 | Admitting: Physician Assistant

## 2019-11-17 DIAGNOSIS — R1310 Dysphagia, unspecified: Secondary | ICD-10-CM

## 2019-11-17 DIAGNOSIS — T17308A Unspecified foreign body in larynx causing other injury, initial encounter: Secondary | ICD-10-CM

## 2019-11-17 NOTE — ED Notes (Signed)
Pt did not respond when called for vitals check 
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
## 2019-11-22 ENCOUNTER — Other Ambulatory Visit: Payer: Self-pay

## 2019-11-22 ENCOUNTER — Other Ambulatory Visit: Payer: Self-pay | Admitting: Family Medicine

## 2019-11-22 ENCOUNTER — Ambulatory Visit
Admission: RE | Admit: 2019-11-22 | Discharge: 2019-11-22 | Disposition: A | Discharge status: Home / Self Care | Payer: 59 | Source: Ambulatory Visit | Attending: Family Medicine | Admitting: Family Medicine

## 2019-11-22 ENCOUNTER — Telehealth: Payer: Self-pay | Admitting: Family Medicine

## 2019-11-22 DIAGNOSIS — M25511 Pain in right shoulder: Secondary | ICD-10-CM

## 2019-11-22 NOTE — Addendum Note (Signed)
Addended by: Sherrie George F on: 11/22/2019 03:29 PM   Modules accepted: Orders

## 2019-11-22 NOTE — Telephone Encounter (Signed)
Patient called back, instead of getting medication she would like to have the MRI done at [facility] since they have an open MRI

## 2019-11-22 NOTE — Telephone Encounter (Signed)
Patient attempted to have MRI done today but was unable to due to her claustrophobia. Patient is requesting medication to take to help with this  She would like the prescription sent to the pharmacy at [facility]

## 2019-11-24 ENCOUNTER — Ambulatory Visit: Payer: 59 | Admitting: Family Medicine

## 2019-11-28 ENCOUNTER — Ambulatory Visit: Payer: 59

## 2019-11-28 ENCOUNTER — Other Ambulatory Visit: Payer: Self-pay

## 2019-11-29 ENCOUNTER — Telehealth: Payer: 59 | Admitting: Family Medicine

## 2019-11-29 ENCOUNTER — Telehealth: Payer: Self-pay | Admitting: Family Medicine

## 2019-11-29 MED ORDER — DIAZEPAM 5 MG PO TABS: ORAL_TABLET | ORAL | 0 refills | Status: DC

## 2019-11-29 MED FILL — diazePAM 5 MG TABS: 5 | 1 days supply | Qty: 2 | Fill #0

## 2019-11-29 NOTE — Telephone Encounter (Signed)
Patient is asking for advice on what medication to take to help with her anxiety regarding getting an MRI.  Patient has a xanax prescription and wants to know if you advise her to take this or be prescribed a valium

## 2019-11-29 NOTE — Telephone Encounter (Signed)
Provided valium for MRI.   Rosemarie Ax, MD [facility] 11/29/2019, 4:16 PM

## 2019-11-30 NOTE — Telephone Encounter (Signed)
Informed patient of Valium prescription

## 2019-12-05 ENCOUNTER — Ambulatory Visit (INDEPENDENT_AMBULATORY_CARE_PROVIDER_SITE_OTHER): Payer: 59

## 2019-12-05 ENCOUNTER — Other Ambulatory Visit: Payer: Self-pay

## 2019-12-05 DIAGNOSIS — M25511 Pain in right shoulder: Secondary | ICD-10-CM | POA: Diagnosis not present

## 2019-12-06 ENCOUNTER — Telehealth (INDEPENDENT_AMBULATORY_CARE_PROVIDER_SITE_OTHER): Payer: Worker's Compensation | Admitting: Family Medicine

## 2019-12-06 DIAGNOSIS — S46811A Strain of other muscles, fascia and tendons at shoulder and upper arm level, right arm, initial encounter: Secondary | ICD-10-CM | POA: Diagnosis not present

## 2019-12-06 NOTE — Assessment & Plan Note (Signed)
Mild tear noticed on MRI.  Has done physical therapy. -Try injection. -May need to send for surgery if no improvement with the injection.

## 2019-12-06 NOTE — Progress Notes (Signed)
Virtual Visit via Video Note  I connected with [name] on 12/06/19 at  3:50 PM EST by a video enabled telemedicine application and verified that I am speaking with the correct person using two identifiers.   I discussed the limitations of evaluation and management by telemedicine and the availability of in person appointments. The patient expressed understanding and agreed to proceed.  History of Present Illness:  [name] is a 43 year old female that is following up for her right shoulder pain.  MRI was revealing for a mild to moderate supraspinatus tendinosis with intrasubstance tear at the insertion.  Has had ongoing physical therapy with limited provement.  Has not tried a steroid injection.   Observations/Objective:  Gen: NAD, alert, cooperative with exam, well-appearing ENT: normal lips, normal nasal mucosa,  Eye: normal EOM, normal conjunctiva and lids  Resp: no accessory muscle use, non-labored,  Psych:  normal insight, alert and oriented   Assessment and Plan:  Supraspinatus tendon tear: Mild tear noticed on MRI.  Has done physical therapy. -Try injection. -May need to send for surgery if no improvement with the injection.  Follow Up Instructions:    I discussed the assessment and treatment plan with the patient. The patient was provided an opportunity to ask questions and all were answered. The patient agreed with the plan and demonstrated an understanding of the instructions.   The patient was advised to call back or seek an in-person evaluation if the symptoms worsen or if the condition fails to improve as anticipated.   Clearance Coots, MD

## 2019-12-07 DIAGNOSIS — K219 Gastro-esophageal reflux disease without esophagitis: Secondary | ICD-10-CM | POA: Diagnosis not present

## 2019-12-07 DIAGNOSIS — R1310 Dysphagia, unspecified: Secondary | ICD-10-CM | POA: Diagnosis not present

## 2019-12-14 ENCOUNTER — Ambulatory Visit (INDEPENDENT_AMBULATORY_CARE_PROVIDER_SITE_OTHER): Payer: Worker's Compensation | Admitting: Family Medicine

## 2019-12-14 ENCOUNTER — Other Ambulatory Visit: Payer: Self-pay

## 2019-12-14 ENCOUNTER — Encounter: Payer: Self-pay | Admitting: Family Medicine

## 2019-12-14 VITALS — BP 120/80 | Ht 62.0 in | Wt 184.0 lb

## 2019-12-14 DIAGNOSIS — M25511 Pain in right shoulder: Secondary | ICD-10-CM | POA: Diagnosis not present

## 2019-12-14 MED ORDER — METHYLPREDNISOLONE ACETATE 40 MG/ML IJ SUSP
40.0000 mg | Freq: Once | INTRAMUSCULAR | Status: AC
  Administered 2019-12-14: 40 mg via INTRA_ARTICULAR

## 2019-12-14 NOTE — Progress Notes (Signed)
After informed written consent timeout was performed, patient was seated in chair in exam room. Right shoulder was prepped with alcohol swab and utilizing lateral approach with ultrasound guidance, patient's right subacromial space was injected with 3:1 bupivicaine: depomedrol. Patient tolerated the procedure well without immediate complications.

## 2019-12-15 ENCOUNTER — Ambulatory Visit: Payer: 59 | Admitting: Family Medicine

## 2019-12-27 NOTE — Progress Notes (Signed)
Virtual Visit via Video Note   This visit type was conducted due to national recommendations for restrictions regarding the COVID-19 Pandemic (e.g. social distancing) in an effort to limit this patient's exposure and mitigate transmission in our community.  Due to her co-morbid illnesses, this patient is at least at moderate risk for complications without adequate follow up.  This format is felt to be most appropriate for this patient at this time.  All issues noted in this document were discussed and addressed.  A limited physical exam was performed with this format.  Please refer to the patient's chart for her consent to telehealth for [facility].   Date:  12/28/2019   ID:  Kelsey Vaughan, DOB [birth year], MRN [id number]  Patient Location:Home Provider Location: Home  PCP:  Donella Stade, PA-C  Cardiologist:  Dr Stanford Breed  Evaluation Performed:  Follow-Up Visit  Chief Complaint:  FU atrial fibrillation and palpitations  History of Present Illness:    FUpalpitations and PAF. Echo 4/17 ([facility]) normal LV function. [facility] 4/18 normal. Pt had episode of atrial fibrillation 3/17. Event monitor 2/18 showed sinus with PVCs. Bystolic increased [id number] because of palpitations associated with PVCs. ABIs February 2020 normal.  Since last seen,patient notes some dyspnea on exertion but no orthopnea, PND or pedal edema.  She does not have exertional chest pain but has an occasional sharp pain in her right chest for 1 to 2 seconds.  She does describe worsening palpitations but has not been taking her Bystolic.  No syncope.  The patient does not have symptoms concerning for COVID-19 infection (fever, chills, cough, or new shortness of breath).    Past Medical History:  Diagnosis Date  . Anxiety   . Atrial fibrillation ([place])    a. occuring in 02/2016  . B12 deficiency   . Constipation   . Foreign body in foot, left 07/2013  . GERD (gastroesophageal reflux disease)   . Hematuria   . Irregular  menses    due to oral contraceptive  . Palpitations    a. 01/2017: Event monitor showing SR with PVC's.   . Sinus tachycardia   . Thyroid nodule   . Vitamin D deficiency    Past Surgical History:  Procedure Laterality Date  . CESAREAN SECTION    . FOREIGN BODY REMOVAL Left 08/05/2013   Procedure: LEFT FOOT FOREIGN BODY REMOVAL ADULT;  Surgeon: Wylene Simmer, MD;  Location: [facility];  Service: Orthopedics;  Laterality: Left;  . SHOULDER ARTHROSCOPY Left   . TUBAL LIGATION       Current Meds  Medication Sig  . AMBULATORY NON FORMULARY MEDICATION salonpass patches with lidocaine may change every 8 hours as needed for pain.  . Diclofenac Sodium (PENNSAID) 2 % SOLN Place 1 application onto the skin 2 (two) times daily.  [name] Kitchen dimenhyDRINATE (DRAMAMINE) 50 MG tablet Take 1 tablet (50 mg total) by mouth every 8 (eight) hours as needed.  Ozzie Hoyle 0.15-30 MG-MCG tablet TAKE 1 TABLET BY MOUTH DAILY.  [name] Kitchen levocetirizine (XYZAL) 5 MG tablet Take 1 tablet (5 mg total) by mouth every evening.  . nebivolol (BYSTOLIC) 2.5 MG tablet Take 1 tablet (2.5 mg total) by mouth daily as needed (palpatations).  . SUMAtriptan (IMITREX) 100 MG tablet Take 1 tablet (100 mg total) by mouth once as needed for up to 1 dose for migraine. May repeat in 2 hours if headache persists or recurs.  . valACYclovir (VALTREX) 1000 MG tablet Take 1 tablet (1,000 mg total) by  mouth 2 (two) times daily.  . [DISCONTINUED] diazepam (VALIUM) 5 MG tablet Take 1 tablet 30 minutes prior to procedure. Can repeat.     Allergies:   Influenza vaccines, Vyvanse [lisdexamfetamine dimesylate], and Wellbutrin [bupropion]   Social History   Tobacco Use  . Smoking status: Current Every Day Smoker    Years: 6.00    Types: Cigarettes  . Smokeless tobacco: Never Used  . Tobacco comment: 4-5 cigs/day  Substance Use Topics  . Alcohol use: Yes    Comment: occasionally  . Drug use: No     Family Hx: The patient's family history  includes CAD in her father and sister; Diabetes in her mother; Heart attack in her father; Hypertension in her mother; Kidney disease in her mother. There is no history of Breast cancer.  ROS:   Please see the history of present illness.    No Fever, chills  or productive cough All other systems reviewed and are negative.  Recent Labs: 01/11/2019: BUN 13; Creatinine, Ser 0.84; Hemoglobin 12.8; Magnesium 1.9; Platelets 337; Potassium 3.7; Sodium 142   Recent Lipid Panel Lab Results  Component Value Date/Time   CHOL 182 09/02/2018 02:35 PM   CHOL 172 06/30/2017 09:19 AM   TRIG 240 (H) 09/02/2018 02:35 PM   HDL 52 09/02/2018 02:35 PM   HDL 58 06/30/2017 09:19 AM   CHOLHDL 3.5 09/02/2018 02:35 PM   LDLCALC 94 09/02/2018 02:35 PM    Wt Readings from Last 3 Encounters:  12/28/19 182 lb (82.6 kg)  12/14/19 184 lb (83.5 kg)  11/01/19 184 lb (83.5 kg)     Objective:    Vital Signs:  Ht 5\' 2"  (1.575 m)   Wt 182 lb (82.6 kg)   BMI 33.29 kg/m    VITAL SIGNS:  reviewed NAD Answers questions appropriately Normal affect Remainder of physical examination not performed (telehealth visit; coronavirus pandemic)  ASSESSMENT & PLAN:    1. Paroxysmal atrial fibrillation-based on history patient remains in sinus rhythm.  Continue beta-blocker. CHADSvasc 1 for female sex.  Continue aspirin.  We will consider addition of antiarrhythmic in the future if she has more frequent episodes. 2. Palpitations-worse recently.  However she has not been taking her Bystolic.  We will resume at 2.5 mg daily. 3. Tobacco abuse-patient counseled on discontinuing. 4. Dyspnea-patient describes some dyspnea on exertion.  She is concerned about coronary disease given her family history.  We will arrange a calcium score for risk stratification.  COVID-19 Education: The importance of social distancing was discussed today.  Time:   Today, I have spent 12 minutes with the patient with telehealth technology  discussing the above problems.     Medication Adjustments/Labs and Tests Ordered: Current medicines are reviewed at length with the patient today.  Concerns regarding medicines are outlined above.   Tests Ordered: No orders of the defined types were placed in this encounter.   Medication Changes: No orders of the defined types were placed in this encounter.   Follow Up:  Either In Person or Virtual in 1 year(s)  Signed, Kirk Ruths, MD  12/28/2019 10:16 AM    [place]

## 2019-12-28 ENCOUNTER — Telehealth (INDEPENDENT_AMBULATORY_CARE_PROVIDER_SITE_OTHER): Payer: BC Managed Care – PPO | Admitting: Cardiology

## 2019-12-28 ENCOUNTER — Encounter: Payer: Self-pay | Admitting: Cardiology

## 2019-12-28 VITALS — Ht 62.0 in | Wt 182.0 lb

## 2019-12-28 DIAGNOSIS — R002 Palpitations: Secondary | ICD-10-CM | POA: Diagnosis not present

## 2019-12-28 DIAGNOSIS — I480 Paroxysmal atrial fibrillation: Secondary | ICD-10-CM

## 2019-12-28 DIAGNOSIS — I491 Atrial premature depolarization: Secondary | ICD-10-CM

## 2019-12-28 DIAGNOSIS — F17200 Nicotine dependence, unspecified, uncomplicated: Secondary | ICD-10-CM | POA: Diagnosis not present

## 2019-12-28 DIAGNOSIS — I479 Paroxysmal tachycardia, unspecified: Secondary | ICD-10-CM

## 2019-12-28 DIAGNOSIS — R072 Precordial pain: Secondary | ICD-10-CM

## 2019-12-28 MED ORDER — NEBIVOLOL HCL 2.5 MG PO TABS: 2.5000 mg | ORAL_TABLET | Freq: Every day | ORAL | 4 refills | Status: DC | PRN

## 2019-12-28 NOTE — Patient Instructions (Signed)
Medication Instructions:  NO CHANGE *If you need a refill on your cardiac medications before your next appointment, please call your pharmacy*  Lab Work: If you have labs (blood work) drawn today and your tests are completely normal, you will receive your results only by: [name] Kitchen MyChart Message (if you have MyChart) OR . A paper copy in the mail If you have any lab test that is abnormal or we need to change your treatment, we will call you to review the results.  Testing/Procedures: CARDIAC CALCIUM SCORE AT [place]  Follow-Up: At [facility], you and your health needs are our priority.  As part of our continuing mission to provide you with exceptional heart care, we have created designated Provider Care Teams.  These Care Teams include your primary Cardiologist (physician) and Advanced Practice Providers (APPs -  Physician Assistants and Nurse Practitioners) who all work together to provide you with the care you need, when you need it.  Your next appointment:   12 month(s)  The format for your next appointment:   Either In Person or Virtual  Provider:   You may see Kirk Ruths MD or one of the following Advanced Practice Providers on your designated Care Team:    Kerin Ransom, PA-C  [place], [place]  Coletta Memos, [place]

## 2020-01-04 ENCOUNTER — Other Ambulatory Visit: Payer: Self-pay

## 2020-01-04 ENCOUNTER — Ambulatory Visit (INDEPENDENT_AMBULATORY_CARE_PROVIDER_SITE_OTHER)
Admission: RE | Admit: 2020-01-04 | Discharge: 2020-01-04 | Disposition: A | Discharge status: Home / Self Care | Payer: Self-pay | Source: Ambulatory Visit | Attending: Cardiology | Admitting: Cardiology

## 2020-01-04 DIAGNOSIS — R072 Precordial pain: Secondary | ICD-10-CM

## 2020-01-09 ENCOUNTER — Encounter: Payer: Self-pay | Admitting: Family Medicine

## 2020-01-10 ENCOUNTER — Ambulatory Visit (INDEPENDENT_AMBULATORY_CARE_PROVIDER_SITE_OTHER): Payer: BC Managed Care – PPO | Admitting: Family Medicine

## 2020-01-10 ENCOUNTER — Encounter: Payer: Self-pay | Admitting: Family Medicine

## 2020-01-10 ENCOUNTER — Other Ambulatory Visit: Payer: Self-pay

## 2020-01-10 VITALS — BP 131/83 | HR 51 | Ht 62.0 in | Wt 186.0 lb

## 2020-01-10 DIAGNOSIS — M7741 Metatarsalgia, right foot: Secondary | ICD-10-CM

## 2020-01-10 NOTE — Assessment & Plan Note (Signed)
Symptoms suggestive of metatarsalgia. Less likely for plantar fasciitis. Not suggestive of stress fracture.  - counseled on supportive care - has PT scheduled through work  - has an MRI schedule through work.  - could consider MT pads or custom orthotics.

## 2020-01-10 NOTE — Progress Notes (Signed)
Kelsey Vaughan - 44 y.o. female MRN [id number]  Date of birth: 1976-02-21  SUBJECTIVE:  Including CC & ROS.  Chief Complaint  Patient presents with  . Foot Pain    right foot x 2 weeks    Kelsey Vaughan is a 44 y.o. female that is  Presenting with acute right foot pain. The pain is occurring over the dorsal and plantar forefoot. No inciting event. Pain is worse with ambulation. No previous history of similar pain. Has some altered sensation over the lateral foot extending to the ankle. No improvement with prednisone. No changes on imaging.   Review of Systems See HPI   HISTORY: Past Medical, Surgical, Social, and Family History Reviewed & Updated per EMR.   Pertinent Historical Findings include:  Past Medical History:  Diagnosis Date  . Anxiety   . Atrial fibrillation ([place])    a. occuring in 02/2016  . B12 deficiency   . Constipation   . Foreign body in foot, left 07/2013  . GERD (gastroesophageal reflux disease)   . Hematuria   . Irregular menses    due to oral contraceptive  . Palpitations    a. 01/2017: Event monitor showing SR with PVC's.   . Sinus tachycardia   . Thyroid nodule   . Vitamin D deficiency     Past Surgical History:  Procedure Laterality Date  . CESAREAN SECTION    . FOREIGN BODY REMOVAL Left 08/05/2013   Procedure: LEFT FOOT FOREIGN BODY REMOVAL ADULT;  Surgeon: Wylene Simmer, MD;  Location: [facility];  Service: Orthopedics;  Laterality: Left;  . SHOULDER ARTHROSCOPY Left   . TUBAL LIGATION      Allergies  Allergen Reactions  . Influenza Vaccines Hives and Rash  . Vyvanse [Lisdexamfetamine Dimesylate]     Headache/mood changes.   . Wellbutrin [Bupropion] Other (See Comments)    Possible nipple soreness/headaches/not feeling right    Family History  Problem Relation Age of Onset  . Diabetes Mother   . Hypertension Mother   . Kidney disease Mother   . CAD Father   . Heart attack Father   . CAD Sister   . Breast cancer  Neg Hx      Social History   Socioeconomic History  . Marital status: Divorced    Spouse name: Not on file  . Number of children: 3  . Years of education: Not on file  . Highest education level: Not on file  Occupational History  . Occupation: Physiological scientist    Employer: [employer]  Tobacco Use  . Smoking status: Current Every Day Smoker    Years: 6.00    Types: Cigarettes  . Smokeless tobacco: Never Used  . Tobacco comment: 4-5 cigs/day  Substance and Sexual Activity  . Alcohol use: Yes    Comment: occasionally  . Drug use: No  . Sexual activity: Not on file    Comment: tubal ligation  Other Topics Concern  . Not on file  Social History Narrative  . Not on file   Social Determinants of Health   Financial Resource Strain:   . Difficulty of Paying Living Expenses: Not on file  Food Insecurity:   . Worried About Charity fundraiser in the Last Year: Not on file  . Ran Out of Food in the Last Year: Not on file  Transportation Needs:   . Lack of Transportation (Medical): Not on file  . Lack of Transportation (Non-Medical): Not on file  Physical Activity:   .  Days of Exercise per Week: Not on file  . Minutes of Exercise per Session: Not on file  Stress:   . Feeling of Stress : Not on file  Social Connections:   . Frequency of Communication with Friends and Family: Not on file  . Frequency of Social Gatherings with Friends and Family: Not on file  . Attends Religious Services: Not on file  . Active Member of Clubs or Organizations: Not on file  . Attends Archivist Meetings: Not on file  . Marital Status: Not on file  Intimate Partner Violence:   . Fear of Current or Ex-Partner: Not on file  . Emotionally Abused: Not on file  . Physically Abused: Not on file  . Sexually Abused: Not on file     PHYSICAL EXAM:  VS: BP 131/83   Pulse (!) 51   Ht 5\' 2"  (1.575 m)   Wt 186 lb (84.4 kg)   BMI 34.02 kg/m  Physical Exam Gen: NAD, alert,  cooperative with exam, well-appearing ENT: normal lips, normal nasal mucosa,  Eye: normal EOM, normal conjunctiva and lids Skin: no rashes, no areas of induration  Neuro: normal tone, normal sensation to touch Psych:  normal insight, alert and oriented MSK:  Right foot:  No swelling or ecchymosis  Normal ankle ROM  No TTP at the base of the calcaneous TTp of the MTP joints  NVI       ASSESSMENT & PLAN:   Metatarsalgia of right foot Symptoms suggestive of metatarsalgia. Less likely for plantar fasciitis. Not suggestive of stress fracture.  - counseled on supportive care - has PT scheduled through work  - has an MRI schedule through work.  - could consider MT pads or custom orthotics.

## 2020-01-10 NOTE — Patient Instructions (Signed)
Good to see you Please try ice  Please avoid going barefoot   Please send me a message in MyChart with any questions or updates.  Please see [place] back as needed.   --Dr. Raeford Razor

## 2020-01-11 ENCOUNTER — Ambulatory Visit (INDEPENDENT_AMBULATORY_CARE_PROVIDER_SITE_OTHER): Payer: Worker's Compensation | Admitting: Family Medicine

## 2020-01-11 ENCOUNTER — Encounter: Payer: Self-pay | Admitting: Family Medicine

## 2020-01-11 DIAGNOSIS — M75101 Unspecified rotator cuff tear or rupture of right shoulder, not specified as traumatic: Secondary | ICD-10-CM | POA: Diagnosis not present

## 2020-01-11 NOTE — Assessment & Plan Note (Signed)
Is doing well since the injection.  Still has pain intermittently with repetitive movements. -Try another months of conservative therapy.  If no improvement will consider discussion about surgery -Counseled on home exercise therapy and supportive care.

## 2020-01-11 NOTE — Progress Notes (Signed)
Kelsey Vaughan - 44 y.o. female MRN [id number]  Date of birth: 26-Dec-1975  SUBJECTIVE:  Including CC & ROS.  Chief Complaint  Patient presents with  . Follow-up    follow up for right shoulder    Kelsey Vaughan is a 44 y.o. female that is following up for her right shoulder pain.  She has been doing well since the injection last month.  She has completed physical therapy and is currently doing home exercises.  Pain is still there when she is does repetitive movements.    Review of Systems See HPI   HISTORY: Past Medical, Surgical, Social, and Family History Reviewed & Updated per EMR.   Pertinent Historical Findings include:  Past Medical History:  Diagnosis Date  . Anxiety   . Atrial fibrillation ([place])    a. occuring in 02/2016  . B12 deficiency   . Constipation   . Foreign body in foot, left 07/2013  . GERD (gastroesophageal reflux disease)   . Hematuria   . Irregular menses    due to oral contraceptive  . Palpitations    a. 01/2017: Event monitor showing SR with PVC's.   . Sinus tachycardia   . Thyroid nodule   . Vitamin D deficiency     Past Surgical History:  Procedure Laterality Date  . CESAREAN SECTION    . FOREIGN BODY REMOVAL Left 08/05/2013   Procedure: LEFT FOOT FOREIGN BODY REMOVAL ADULT;  Surgeon: Wylene Simmer, MD;  Location: [facility];  Service: Orthopedics;  Laterality: Left;  . SHOULDER ARTHROSCOPY Left   . TUBAL LIGATION      Allergies  Allergen Reactions  . Influenza Vaccines Hives and Rash  . Vyvanse [Lisdexamfetamine Dimesylate]     Headache/mood changes.   . Wellbutrin [Bupropion] Other (See Comments)    Possible nipple soreness/headaches/not feeling right    Family History  Problem Relation Age of Onset  . Diabetes Mother   . Hypertension Mother   . Kidney disease Mother   . CAD Father   . Heart attack Father   . CAD Sister   . Breast cancer Neg Hx      Social History   Socioeconomic History  . Marital  status: Divorced    Spouse name: Not on file  . Number of children: 3  . Years of education: Not on file  . Highest education level: Not on file  Occupational History  . Occupation: Physiological scientist    Employer: [employer]  Tobacco Use  . Smoking status: Current Every Day Smoker    Years: 6.00    Types: Cigarettes  . Smokeless tobacco: Never Used  . Tobacco comment: 4-5 cigs/day  Substance and Sexual Activity  . Alcohol use: Yes    Comment: occasionally  . Drug use: No  . Sexual activity: Not on file    Comment: tubal ligation  Other Topics Concern  . Not on file  Social History Narrative  . Not on file   Social Determinants of Health   Financial Resource Strain:   . Difficulty of Paying Living Expenses: Not on file  Food Insecurity:   . Worried About Charity fundraiser in the Last Year: Not on file  . Ran Out of Food in the Last Year: Not on file  Transportation Needs:   . Lack of Transportation (Medical): Not on file  . Lack of Transportation (Non-Medical): Not on file  Physical Activity:   . Days of Exercise per Week: Not on file  .  Minutes of Exercise per Session: Not on file  Stress:   . Feeling of Stress : Not on file  Social Connections:   . Frequency of Communication with Friends and Family: Not on file  . Frequency of Social Gatherings with Friends and Family: Not on file  . Attends Religious Services: Not on file  . Active Member of Clubs or Organizations: Not on file  . Attends Archivist Meetings: Not on file  . Marital Status: Not on file  Intimate Partner Violence:   . Fear of Current or Ex-Partner: Not on file  . Emotionally Abused: Not on file  . Physically Abused: Not on file  . Sexually Abused: Not on file     PHYSICAL EXAM:  VS: BP 123/83   Ht 5\' 2"  (1.575 m)   Wt 186 lb (84.4 kg)   BMI 34.02 kg/m  Physical Exam Gen: NAD, alert, cooperative with exam, well-appearing ENT: normal lips, normal nasal mucosa,  Eye:  normal EOM, normal conjunctiva and lids Skin: no rashes, no areas of induration  Neuro: normal tone, normal sensation to touch Psych:  normal insight, alert and oriented MSK:  Right shoulder: Normal internal and external rotation. Normal abduction and flexion. Normal strength resistance. No significant pain with empty can testing. Neurovascularly intact     ASSESSMENT & PLAN:   Supraspinatus tendon tear Is doing well since the injection.  Still has pain intermittently with repetitive movements. -Try another months of conservative therapy.  If no improvement will consider discussion about surgery -Counseled on home exercise therapy and supportive care.

## 2020-01-11 NOTE — Patient Instructions (Signed)
Good to see you Please keep performing the home rehab  Please try ice   Please send me a message in MyChart with any questions or updates.  Please see me back in 4 weeks.   --Dr. Raeford Razor

## 2020-01-26 ENCOUNTER — Encounter: Payer: Self-pay | Admitting: Family Medicine

## 2020-01-26 ENCOUNTER — Ambulatory Visit (INDEPENDENT_AMBULATORY_CARE_PROVIDER_SITE_OTHER): Payer: Worker's Compensation | Admitting: Family Medicine

## 2020-01-26 ENCOUNTER — Other Ambulatory Visit: Payer: Self-pay

## 2020-01-26 VITALS — BP 112/75 | HR 98 | Ht 62.0 in

## 2020-01-26 DIAGNOSIS — M75101 Unspecified rotator cuff tear or rupture of right shoulder, not specified as traumatic: Secondary | ICD-10-CM

## 2020-01-26 NOTE — Assessment & Plan Note (Signed)
Has acutely worsened as of late.  We have tried injections, medications and physical therapy.  Seems to be exacerbated by her work. -Counseled on supportive care. -Referral to Dr. Tamera Punt at [facility].

## 2020-01-26 NOTE — Patient Instructions (Signed)
Good to see you  Please use ice  The orthopedists office will call you  Please send me a message in [place] with any questions or updates.  Please see [place] back as needed.   --Dr. Raeford Razor

## 2020-01-26 NOTE — Progress Notes (Signed)
Kelsey Vaughan - 44 y.o. female MRN [id number]  Date of birth: 1976/10/02  SUBJECTIVE:  Including CC & ROS.  Chief Complaint  Patient presents with  . Follow-up    follow up for right arm    Kelsey Vaughan is a 44 y.o. female that is presenting with acute worsening of her right shoulder pain.  She has gone through physical therapy, injections and other conservative measures and the pain has acutely worsened as of late.  She is having trouble with internal rotation.  This was exacerbated by her work.  Her initial injury was done while she is at work.    Review of Systems See HPI   HISTORY: Past Medical, Surgical, Social, and Family History Reviewed & Updated per EMR.   Pertinent Historical Findings include:  Past Medical History:  Diagnosis Date  . Anxiety   . Atrial fibrillation ([place])    a. occuring in 02/2016  . B12 deficiency   . Constipation   . Foreign body in foot, left 07/2013  . GERD (gastroesophageal reflux disease)   . Hematuria   . Irregular menses    due to oral contraceptive  . Palpitations    a. 01/2017: Event monitor showing SR with PVC's.   . Sinus tachycardia   . Thyroid nodule   . Vitamin D deficiency     Past Surgical History:  Procedure Laterality Date  . CESAREAN SECTION    . FOREIGN BODY REMOVAL Left 08/05/2013   Procedure: LEFT FOOT FOREIGN BODY REMOVAL ADULT;  Surgeon: Wylene Simmer, MD;  Location: [facility];  Service: Orthopedics;  Laterality: Left;  . SHOULDER ARTHROSCOPY Left   . TUBAL LIGATION      Family History  Problem Relation Age of Onset  . Diabetes Mother   . Hypertension Mother   . Kidney disease Mother   . CAD Father   . Heart attack Father   . CAD Sister   . Breast cancer Neg Hx     Social History   Socioeconomic History  . Marital status: Divorced    Spouse name: Not on file  . Number of children: 3  . Years of education: Not on file  . Highest education level: Not on file  Occupational History    . Occupation: Physiological scientist    Employer: [employer]  Tobacco Use  . Smoking status: Current Every Day Smoker    Years: 6.00    Types: Cigarettes  . Smokeless tobacco: Never Used  . Tobacco comment: 4-5 cigs/day  Substance and Sexual Activity  . Alcohol use: Yes    Comment: occasionally  . Drug use: No  . Sexual activity: Not on file    Comment: tubal ligation  Other Topics Concern  . Not on file  Social History Narrative  . Not on file   Social Determinants of Health   Financial Resource Strain:   . Difficulty of Paying Living Expenses: Not on file  Food Insecurity:   . Worried About Charity fundraiser in the Last Year: Not on file  . Ran Out of Food in the Last Year: Not on file  Transportation Needs:   . Lack of Transportation (Medical): Not on file  . Lack of Transportation (Non-Medical): Not on file  Physical Activity:   . Days of Exercise per Week: Not on file  . Minutes of Exercise per Session: Not on file  Stress:   . Feeling of Stress : Not on file  Social Connections:   .  Frequency of Communication with Friends and Family: Not on file  . Frequency of Social Gatherings with Friends and Family: Not on file  . Attends Religious Services: Not on file  . Active Member of Clubs or Organizations: Not on file  . Attends Archivist Meetings: Not on file  . Marital Status: Not on file  Intimate Partner Violence:   . Fear of Current or Ex-Partner: Not on file  . Emotionally Abused: Not on file  . Physically Abused: Not on file  . Sexually Abused: Not on file     PHYSICAL EXAM:  VS: BP 112/75   Pulse 98   Ht 5\' 2"  (1.575 m)   BMI 34.02 kg/m  Physical Exam Gen: NAD, alert, cooperative with exam, well-appearing MSK:  Right shoulder: Limited internal rotation. Some pain with empty can testing. Normal strength resistance with internal and external rotation. Neurovascularly intact     ASSESSMENT & PLAN:   Supraspinatus tendon  tear Has acutely worsened as of late.  We have tried injections, medications and physical therapy.  Seems to be exacerbated by her work. -Counseled on supportive care. -Referral to Dr. Tamera Punt at [facility].

## 2020-01-31 ENCOUNTER — Emergency Department (HOSPITAL_COMMUNITY): Payer: BC Managed Care – PPO

## 2020-01-31 ENCOUNTER — Emergency Department (HOSPITAL_COMMUNITY)
Admission: EM | Admit: 2020-01-31 | Discharge: 2020-01-31 | Disposition: A | Discharge status: Home / Self Care | Payer: BC Managed Care – PPO | Attending: Emergency Medicine | Admitting: Emergency Medicine

## 2020-01-31 ENCOUNTER — Other Ambulatory Visit: Payer: Self-pay

## 2020-01-31 ENCOUNTER — Encounter (HOSPITAL_COMMUNITY): Payer: Self-pay

## 2020-01-31 DIAGNOSIS — R2689 Other abnormalities of gait and mobility: Secondary | ICD-10-CM | POA: Diagnosis not present

## 2020-01-31 DIAGNOSIS — F17210 Nicotine dependence, cigarettes, uncomplicated: Secondary | ICD-10-CM | POA: Diagnosis not present

## 2020-01-31 DIAGNOSIS — R0902 Hypoxemia: Secondary | ICD-10-CM | POA: Diagnosis not present

## 2020-01-31 DIAGNOSIS — R55 Syncope and collapse: Secondary | ICD-10-CM

## 2020-01-31 DIAGNOSIS — I10 Essential (primary) hypertension: Secondary | ICD-10-CM | POA: Diagnosis not present

## 2020-01-31 DIAGNOSIS — R42 Dizziness and giddiness: Secondary | ICD-10-CM

## 2020-01-31 DIAGNOSIS — H538 Other visual disturbances: Secondary | ICD-10-CM | POA: Diagnosis not present

## 2020-01-31 DIAGNOSIS — Z79899 Other long term (current) drug therapy: Secondary | ICD-10-CM | POA: Insufficient documentation

## 2020-01-31 LAB — I-STAT CHEM 8, ED
BUN: 14 mg/dL (ref 6–20)
Calcium, Ion: 1.12 mmol/L — ABNORMAL LOW (ref 1.15–1.40)
Chloride: 106 mmol/L (ref 98–111)
Creatinine, Ser: 0.9 mg/dL (ref 0.44–1.00)
Glucose, Bld: 103 mg/dL — ABNORMAL HIGH (ref 70–99)
HCT: 39 % (ref 36.0–46.0)
Hemoglobin: 13.3 g/dL (ref 12.0–15.0)
Potassium: 3.6 mmol/L (ref 3.5–5.1)
Sodium: 139 mmol/L (ref 135–145)
TCO2: 24 mmol/L (ref 22–32)

## 2020-01-31 LAB — CBC
HCT: 38.6 % (ref 36.0–46.0)
Hemoglobin: 12.4 g/dL (ref 12.0–15.0)
MCH: 28.4 pg (ref 26.0–34.0)
MCHC: 32.1 g/dL (ref 30.0–36.0)
MCV: 88.3 fL (ref 80.0–100.0)
Platelets: 321 10*3/uL (ref 150–400)
RBC: 4.37 MIL/uL (ref 3.87–5.11)
RDW: 15.7 % — ABNORMAL HIGH (ref 11.5–15.5)
WBC: 12.1 10*3/uL — ABNORMAL HIGH (ref 4.0–10.5)
nRBC: 0 % (ref 0.0–0.2)

## 2020-01-31 LAB — APTT: aPTT: 25 seconds (ref 24–36)

## 2020-01-31 LAB — DIFFERENTIAL
Abs Immature Granulocytes: 0.04 10*3/uL (ref 0.00–0.07)
Basophils Absolute: 0 10*3/uL (ref 0.0–0.1)
Basophils Relative: 0 %
Eosinophils Absolute: 0.2 10*3/uL (ref 0.0–0.5)
Eosinophils Relative: 2 %
Immature Granulocytes: 0 %
Lymphocytes Relative: 33 %
Lymphs Abs: 4 10*3/uL (ref 0.7–4.0)
Monocytes Absolute: 1.1 10*3/uL — ABNORMAL HIGH (ref 0.1–1.0)
Monocytes Relative: 9 %
Neutro Abs: 6.8 10*3/uL (ref 1.7–7.7)
Neutrophils Relative %: 56 %

## 2020-01-31 LAB — COMPREHENSIVE METABOLIC PANEL
ALT: 20 U/L (ref 0–44)
AST: 17 U/L (ref 15–41)
Albumin: 3.2 g/dL — ABNORMAL LOW (ref 3.5–5.0)
Alkaline Phosphatase: 72 U/L (ref 38–126)
Anion gap: 9 (ref 5–15)
BUN: 13 mg/dL (ref 6–20)
CO2: 22 mmol/L (ref 22–32)
Calcium: 8.7 mg/dL — ABNORMAL LOW (ref 8.9–10.3)
Chloride: 104 mmol/L (ref 98–111)
Creatinine, Ser: 0.83 mg/dL (ref 0.44–1.00)
GFR calc Af Amer: >60 mL/min (ref >60)
GFR calc non Af Amer: >60 mL/min (ref >60)
Glucose, Bld: 103 mg/dL — ABNORMAL HIGH (ref 70–99)
Potassium: 3.5 mmol/L (ref 3.5–5.1)
Sodium: 135 mmol/L (ref 135–145)
Total Bilirubin: 0.6 mg/dL (ref 0.3–1.2)
Total Protein: 6.4 g/dL — ABNORMAL LOW (ref 6.5–8.1)

## 2020-01-31 LAB — PROTIME-INR
INR: 0.9 (ref 0.8–1.2)
Prothrombin Time: 11.8 seconds (ref 11.4–15.2)

## 2020-01-31 LAB — I-STAT BETA HCG BLOOD, ED (MC, WL, AP ONLY): I-stat hCG, quantitative: <5 m[IU]/mL (ref <5)

## 2020-01-31 LAB — CBG MONITORING, ED: Glucose-Capillary: 101 mg/dL — ABNORMAL HIGH (ref 70–99)

## 2020-01-31 MED ORDER — ONDANSETRON HCL 4 MG/2ML IJ SOLN
4.0000 mg | Freq: Once | INTRAMUSCULAR | Status: AC
  Administered 2020-01-31: 14:00:00 4 mg via INTRAVENOUS
  Filled 2020-01-31: qty 2

## 2020-01-31 MED ORDER — SODIUM CHLORIDE 0.9 % IV BOLUS
1000.0000 mL | Freq: Once | INTRAVENOUS | Status: AC
  Administered 2020-01-31: 14:00:00 1000 mL via INTRAVENOUS

## 2020-01-31 NOTE — Discharge Instructions (Addendum)
Please schedule follow-up appointment with your primary doctor regarding the episode you had today.  Recommend rest and staying hydrated for the remainder of the day today.  Return to ER if you develop any episodes of passing out, numbness, weakness, speech changes or other new concerning symptom.

## 2020-01-31 NOTE — ED Notes (Signed)
Patient ambulated to restroom with steady gait.

## 2020-01-31 NOTE — ED Triage Notes (Signed)
Pt arrived via EMS from [facility] in [place]. Pt c/o of numbness and tingling on left side of face and left arm. Pt stated she also felt off balance and her vision was weird. EMS noted pt neuro intact, stroke screen negative.

## 2020-01-31 NOTE — ED Provider Notes (Signed)
[facility] EMERGENCY DEPARTMENT Provider Note   CSN: [id number] Arrival date & time: 01/31/20  1142     History Chief Complaint  Patient presents with  . Numbness    Kelsey Vaughan is a 44 y.o. female.  Presents to ER with complaint of lightheaded feeling.  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  While she was walking the halls, felt lightheaded, somewhat queasy, had to sit down.  Bystander got her some water and had improvement in symptoms.  There was a time when she had a brief sensation of left arm and hand tingling.  Lasting seconds.  Resolved quickly.  Felt lightheaded but no room spinning.  No syncope.  No ongoing symptoms at this time except feels mildly nauseous.  HPI     Past Medical History:  Diagnosis Date  . Anxiety   . Atrial fibrillation ([place])    a. occuring in 02/2016  . B12 deficiency   . Constipation   . Foreign body in foot, left 07/2013  . GERD (gastroesophageal reflux disease)   . Hematuria   . Irregular menses    due to oral contraceptive  . Palpitations    a. 01/2017: Event monitor showing SR with PVC's.   . Sinus tachycardia   . Thyroid nodule   . Vitamin D deficiency     Patient Active Problem List   Diagnosis Date Noted  . Metatarsalgia of right foot 01/10/2020  . Choking 11/17/2019  . Dysphagia 11/17/2019  . Patellofemoral pain syndrome of left knee 10/27/2019  . Current smoker 10/22/2019  . Anxiety 10/22/2019  . Supraspinatus tendon tear 09/13/2019  . Chronic left-sided low back pain with left-sided sciatica 06/02/2019  . Impulsiveness 02/15/2019  . Inattention 02/15/2019  . Dry eye of right side 01/18/2019  . Pterygium of right eye 01/18/2019  . Acute recurrent pansinusitis 01/18/2019  . Cramping of feet 01/07/2019  . Paresthesia 01/07/2019  . DDD (degenerative disc disease), lumbar 12/17/2018  . Acute stress disorder 09/03/2018  . Trouble in sleeping 09/03/2018    . No energy 09/03/2018  . Chronic glomerulonephritis 08/11/2018  . Elevated fasting glucose 05/25/2018  . Hypertriglyceridemia 05/25/2018  . Binge-eating disorder, mild 05/20/2018  . Irritability and anger 02/23/2018  . Mood changes 12/24/2017  . Irritable 12/24/2017  . Vaginal discharge 11/04/2017  . Vaginal irritation 11/04/2017  . Lower abdominal pain 11/04/2017  . Severe anxiety 10/14/2017  . Stress reaction 10/12/2017  . Frontal headache 09/16/2017  . MDD (major depressive disorder), recurrent episode, mild ([place]) 09/03/2017  . Insulin resistance 07/30/2017  . Epigastric abdominal pain 04/17/2017  . Constipation 04/17/2017  . Hematuria 04/17/2017  . Chronic sinusitis 04/07/2017  . Leiomyoma of uterus 02/26/2017  . Renal cyst, right 02/26/2017  . Sinus tachycardia 02/06/2017  . Palpitations 01/29/2017  . PAC (premature atrial contraction) 01/29/2017  . Vitamin D deficiency 12/23/2016  . B12 deficiency 12/23/2016  . Dysmenorrhea 12/19/2016  . Bilateral cold feet 12/19/2016  . GAD (generalized anxiety disorder) 12/19/2016  . Tobacco dependence 12/19/2016  . Tachycardia, paroxysmal ([place]) 03/07/2016  . Microscopic hematuria 02/29/2016  . Allergy to influenza vaccine 10/02/2015  . Class 1 obesity due to excess calories without serious comorbidity with body mass index (BMI) of 30.0 to 30.9 in adult 11/25/2014  . Neck strain 11/04/2014  . Sinus tarsi syndrome of left ankle 07/16/2013    Past Surgical History:  Procedure Laterality Date  . CESAREAN SECTION    .  FOREIGN BODY REMOVAL Left 08/05/2013   Procedure: LEFT FOOT FOREIGN BODY REMOVAL ADULT;  Surgeon: Wylene Simmer, MD;  Location: [facility];  Service: Orthopedics;  Laterality: Left;  . SHOULDER ARTHROSCOPY Left   . TUBAL LIGATION       OB History    Gravida  3   Para  3   Term  3   Preterm      AB      Living  3     SAB      TAB      Ectopic      Multiple      Live Births               Family History  Problem Relation Age of Onset  . Diabetes Mother   . Hypertension Mother   . Kidney disease Mother   . CAD Father   . Heart attack Father   . CAD Sister   . Breast cancer Neg Hx     Social History   Tobacco Use  . Smoking status: Current Every Day Smoker    Years: 6.00    Types: Cigarettes  . Smokeless tobacco: Never Used  . Tobacco comment: 4-5 cigs/day  Substance Use Topics  . Alcohol use: Yes    Comment: occasionally  . Drug use: No    Home Medications Prior to Admission medications   Medication Sig Start Date End Date Taking? Authorizing Provider  AMBULATORY NON FORMULARY MEDICATION salonpass patches with lidocaine may change every 8 hours as needed for pain. 07/15/18   Breeback, Royetta Car, PA-C  Diclofenac Sodium (PENNSAID) 2 % SOLN Place 1 application onto the skin 2 (two) times daily. 10/04/19   Rosemarie Ax, MD  dimenhyDRINATE (DRAMAMINE) 50 MG tablet Take 1 tablet (50 mg total) by mouth every 8 (eight) hours as needed. 09/25/18   Breeback, Jade L, PA-C  JULEBER 0.15-30 MG-MCG tablet TAKE 1 TABLET BY MOUTH DAILY. 10/11/19   Breeback, Jade L, PA-C  levocetirizine (XYZAL) 5 MG tablet Take 1 tablet (5 mg total) by mouth every evening. 02/25/18   Breeback, Jade L, PA-C  nebivolol (BYSTOLIC) 2.5 MG tablet Take 1 tablet (2.5 mg total) by mouth daily as needed (palpatations). 12/28/19   Lelon Perla, MD  SUMAtriptan (IMITREX) 100 MG tablet Take 1 tablet (100 mg total) by mouth once as needed for up to 1 dose for migraine. May repeat in 2 hours if headache persists or recurs. 03/23/19   Breeback, Royetta Car, PA-C  valACYclovir (VALTREX) 1000 MG tablet Take 1 tablet (1,000 mg total) by mouth 2 (two) times daily. 03/30/19   Breeback, Luvenia Starch L, PA-C    Allergies    Influenza vaccines, Vyvanse [lisdexamfetamine dimesylate], and Wellbutrin [bupropion]  Review of Systems   Review of Systems  Constitutional: Negative for chills and fever.  HENT: Negative  for ear pain and sore throat.   Eyes: Negative for pain and visual disturbance.  Respiratory: Negative for cough and shortness of breath.   Cardiovascular: Negative for chest pain and palpitations.  Gastrointestinal: Negative for abdominal pain and vomiting.  Genitourinary: Negative for dysuria and hematuria.  Musculoskeletal: Negative for arthralgias and back pain.  Skin: Negative for color change and rash.  Neurological: Positive for light-headedness and numbness. Negative for seizures and syncope.  All other systems reviewed and are negative.   Physical Exam Updated Vital Signs BP (!) 108/58   Pulse 95   Temp 98.7 F (37.1 C) (Oral)  Resp (!) 23   Ht 5\' 2"  (1.575 m)   Wt 83.5 kg   SpO2 100%   BMI 33.65 kg/m   Physical Exam Vitals and nursing note reviewed.  Constitutional:      General: She is not in acute distress.    Appearance: She is well-developed.  HENT:     Head: Normocephalic and atraumatic.     Mouth/Throat:     Mouth: Mucous membranes are moist.     Pharynx: No oropharyngeal exudate.  Eyes:     Conjunctiva/sclera: Conjunctivae normal.  Cardiovascular:     Rate and Rhythm: Normal rate and regular rhythm.     Heart sounds: No murmur.  Pulmonary:     Effort: Pulmonary effort is normal. No respiratory distress.     Breath sounds: Normal breath sounds.  Abdominal:     Palpations: Abdomen is soft.     Tenderness: There is no abdominal tenderness.  Musculoskeletal:        General: No deformity or signs of injury.     Cervical back: Neck supple.  Skin:    General: Skin is warm and dry.     Capillary Refill: Capillary refill takes less than 2 seconds.  Neurological:     General: No focal deficit present.     Mental Status: She is alert and oriented to person, place, and time.  Psychiatric:        Mood and Affect: Mood normal.        Behavior: Behavior normal.     ED Results / Procedures / Treatments   Labs (all labs ordered are listed, but only  abnormal results are displayed) Labs Reviewed  CBC - Abnormal; Notable for the following components:      Result Value   WBC 12.1 (*)    RDW 15.7 (*)    All other components within normal limits  DIFFERENTIAL - Abnormal; Notable for the following components:   Monocytes Absolute 1.1 (*)    All other components within normal limits  COMPREHENSIVE METABOLIC PANEL - Abnormal; Notable for the following components:   Glucose, Bld 103 (*)    Calcium 8.7 (*)    Total Protein 6.4 (*)    Albumin 3.2 (*)    All other components within normal limits  CBG MONITORING, ED - Abnormal; Notable for the following components:   Glucose-Capillary 101 (*)    All other components within normal limits  I-STAT CHEM 8, ED - Abnormal; Notable for the following components:   Glucose, Bld 103 (*)    Calcium, Ion 1.12 (*)    All other components within normal limits  PROTIME-INR  APTT  CBG MONITORING, ED  I-STAT BETA HCG BLOOD, ED (MC, WL, AP ONLY)    EKG EKG Interpretation  Date/Time:  Monday January 31 2020 12:01:55 EST Ventricular Rate:  96 PR Interval:    QRS Duration: 88 QT Interval:  354 QTC Calculation: 448 R Axis:   51 Text Interpretation: Sinus rhythm Confirmed by Madalyn Rob [phone]) on 01/31/2020 12:13:50 PM   Radiology CT HEAD WO CONTRAST  Result Date: 01/31/2020 CLINICAL DATA:  Difficulty with balance and visual alteration. Nausea. EXAM: CT HEAD WITHOUT CONTRAST TECHNIQUE: Contiguous axial images were obtained from the base of the skull through the vertex without intravenous contrast. COMPARISON:  None. FINDINGS: Brain: Ventricles and sulci are normal in size and configuration. There is no intracranial mass hemorrhage, extra-axial fluid collection, or midline shift. Brain parenchyma appears unremarkable. No evident acute infarct. Vascular: No  hyperdense vessel.  No vascular calcifications evident. Skull: Bony calvarium appears intact. Sinuses/Orbits: There is mucosal thickening and  opacification in multiple ethmoid air cells. Other visualized paranasal sinuses are clear. Visualized orbits appear symmetric bilaterally. Other: Mastoid air cells are clear. IMPRESSION: Ethmoid sinus disease bilaterally.  Study otherwise unremarkable. Electronically Signed   By: Lowella Grip III M.D.   On: 01/31/2020 12:32    Procedures Procedures (including critical care time)  Medications Ordered in ED Medications  ondansetron (ZOFRAN) injection 4 mg (4 mg Intravenous Given 01/31/20 1349)  sodium chloride 0.9 % bolus 1,000 mL (0 mLs Intravenous Stopped 01/31/20 1452)    ED Course  I have reviewed the triage vital signs and the nursing notes.  Pertinent labs & imaging results that were available during my care of the patient were reviewed by me and considered in my medical decision making (see chart for details).  Clinical Course as of Jan 30 1547  Mon Jan 31, 2020  1501 Rechecked, symptoms resolved, will discharge home   [RD]    Clinical Course User Index [RD] Lucrezia Starch, MD   MDM Rules/Calculators/A&P                      44 year old lady who presented to ER after episode of lightheadedness while shopping at [employer].  Here patient noted to be well-appearing, no real ongoing symptoms.  She reported 2 sec of left arm and hand tingling.  No other neurologic complaints.  Normal neurologic exam here, CT head negative.  Doubt acute neurologic process.  No associated chest pain, no difficulty in breathing, no acute EKG changes, doubt ACS.  Suspect most likely near syncope, vasovagal.  Provided rehydration, Zofran.  No ongoing symptoms on reassessment, will discharge home, recommend follow-up with primary doctor.    After the discussed management above, the patient was determined to be safe for discharge.  The patient was in agreement with this plan and all questions regarding their care were answered.  ED return precautions were discussed and the patient will return to the ED  with any significant worsening of condition.   Final Clinical Impression(s) / ED Diagnoses Final diagnoses:  Lightheadedness  Near syncope    Rx / DC Orders ED Discharge Orders    None       Lucrezia Starch, MD 01/31/20 1551

## 2020-02-08 ENCOUNTER — Encounter: Payer: BC Managed Care – PPO | Admitting: Family Medicine

## 2020-02-09 ENCOUNTER — Telehealth: Payer: Self-pay | Admitting: *Deleted

## 2020-02-09 NOTE — Telephone Encounter (Signed)
   [facility] Pre-operative Risk Assessment    Request for surgical clearance:  1. What type of surgery is being performed? Right shoulder arthroscopic rotator cuff debridement, subacromial decompression, distal clavicle excision vs repair   2. When is this surgery scheduled? TBD   3. What type of clearance is required (medical clearance vs. Pharmacy clearance to hold med vs. Both)? medical  4. Are there any medications that need to be held prior to surgery and how long? no   5. Practice name and name of physician performing surgery? [facility] Dr. Tamera Punt   6. What is your office phone number [phone]    7.   What is your office fax number [phone]  8.   Anesthesia type (None, local, MAC, general) ? general   Sayed Apostol A Dorthey Depace 02/09/2020, 3:11 PM  _________________________________________________________________

## 2020-02-09 NOTE — Telephone Encounter (Signed)
   Primary Cardiologist:Brian Stanford Breed, MD  Chart reviewed as part of pre-operative protocol coverage. Because of Kelsey Vaughan's past medical history and time since last visit, he/she will require a follow-up visit in order to better assess preoperative cardiovascular risk. She was having increase palpitations and seen in ER.     Pre-op covering staff: - Please schedule appointment and call patient to inform them. - Please contact requesting surgeon's office via preferred method (i.e, phone, fax) to inform them of need for appointment prior to surgery.  If applicable, this message will also be routed to pharmacy pool and/or primary cardiologist for input on holding anticoagulant/antiplatelet agent as requested below so that this information is available at time of patient's appointment.   Cecilie Kicks, NP  02/09/2020, 3:48 PM

## 2020-02-09 NOTE — Telephone Encounter (Signed)
1st attempt to reach the pt re: needing an appt for surgical clearance.  Left a detailed message for pt to call back and make an appt.

## 2020-02-10 NOTE — Telephone Encounter (Signed)
Pt has appt 02/15/20 with Sande Rives, PA 02/15/20 for pre op clearance. I will forward clearance notes to PA for upcoming appt. I will send FYI to the surgeon. Will remove from the pre op call back pool.

## 2020-02-11 ENCOUNTER — Encounter: Payer: Self-pay | Admitting: Physician Assistant

## 2020-02-11 ENCOUNTER — Other Ambulatory Visit: Payer: Self-pay

## 2020-02-11 ENCOUNTER — Ambulatory Visit (INDEPENDENT_AMBULATORY_CARE_PROVIDER_SITE_OTHER): Payer: BC Managed Care – PPO | Admitting: Physician Assistant

## 2020-02-11 VITALS — BP 111/78 | HR 106 | Ht 62.0 in | Wt 186.0 lb

## 2020-02-11 DIAGNOSIS — Z01818 Encounter for other preprocedural examination: Secondary | ICD-10-CM | POA: Diagnosis not present
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
## 2020-02-12 LAB — COMPLETE METABOLIC PANEL WITH GFR
AG Ratio: 1.3 (calc) (ref 1.0–2.5)
ALT: 17 U/L (ref 6–29)
AST: 12 U/L (ref 10–30)
Albumin: 3.7 g/dL (ref 3.6–5.1)
Alkaline phosphatase (APISO): 72 U/L (ref 31–125)
BUN: 12 mg/dL (ref 7–25)
CO2: 28 mmol/L (ref 20–32)
Calcium: 9.1 mg/dL (ref 8.6–10.2)
Chloride: 106 mmol/L (ref 98–110)
Creat: 0.8 mg/dL (ref 0.50–1.10)
GFR, Est African American: 105 mL/min/{1.73_m2} (ref >=60)
GFR, Est Non African American: 90 mL/min/{1.73_m2} (ref >=60)
Globulin: 2.8 g/dL (calc) (ref 1.9–3.7)
Glucose, Bld: 90 mg/dL (ref 65–139)
Potassium: 4 mmol/L (ref 3.5–5.3)
Sodium: 140 mmol/L (ref 135–146)
Total Bilirubin: 0.2 mg/dL (ref 0.2–1.2)
Total Protein: 6.5 g/dL (ref 6.1–8.1)

## 2020-02-12 LAB — CBC
HCT: 38.6 % (ref 35.0–45.0)
Hemoglobin: 13 g/dL (ref 11.7–15.5)
MCH: 28.8 pg (ref 27.0–33.0)
MCHC: 33.7 g/dL (ref 32.0–36.0)
MCV: 85.6 fL (ref 80.0–100.0)
MPV: 11.5 fL (ref 7.5–12.5)
Platelets: 336 10*3/uL (ref 140–400)
RBC: 4.51 10*6/uL (ref 3.80–5.10)
RDW: 13.8 % (ref 11.0–15.0)
WBC: 10.6 10*3/uL (ref 3.8–10.8)

## 2020-02-12 LAB — FERRITIN: Ferritin: 54 ng/mL (ref 16–232)

## 2020-02-13 NOTE — Progress Notes (Signed)
Cardiology Office Note:    Date:  02/15/2020   ID:  Kelsey Vaughan, DOB 12-22-1975, MRN [id number]  PCP:  Donella Stade, PA-C  Cardiologist:  Kirk Ruths, MD  Electrophysiologist:  None   Referring MD: Donella Stade, PA-C   Chief Complaint: pre-op evaluation  History of Present Illness:    Kelsey Vaughan is a 44 y.o. female with a history of paroxysmal atrial fibrillation on Aspirin, thyroid nodule, GERD, and anxiety who is followed by Dr. Stanford Breed and presents today for pre-op evaluation.   Patient noted to have an episode of atrial fibrillation in 02/2016. Event monitor in 01/2017 showed sinus rhythm with PVCs. Bystolic was increased in 05/2017 because of palpitations associated with PVCs. She was last seen by Dr. Stanford Breed for a telehealth visit on 12/28/2019 at which time patient report worsening palpitations but admitted she had not been taking her Bystolic. She also reported occasional sharp pain in the right side of her chest that last for 1-2 seconds as well as some dyspnea on exertion. She denied any exertional chest pain; however, she was concerned about CAD given her family history. Therefore, calcium score was ordered for risk stratification and Bystolic was restarted to help with palpitations. Coronary calcium score came back as 0.   Since her last visit, she was seen in the ED on 01/31/2020 for further evaluation of lightheadedness as well as brief episode of left arm/hand tingling that resolved within seconds. She denied any palpitations at the time. Head CT showed ethmoid sinus disease bilaterally but was otherwise unremarkable. EKG showed normal sinus rhythm with no acute changes. Neurologic exam was normal. Symptoms were felt to be most likely vasovagal in nature. She was given IV fluids and Zofran and symptoms resolved. Therefore, she was discharged home.   Patient was seen at a [facility] ED yesterday for evaluation of palpitations followed by chest tightness and  shortness of breath. Reviewed ED note and lab/test results. 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 Unable to personally review EKG but per report showed sinus tachycardia with rate of 107 bpm. Troponin negative x2. WBC 10.5, Hgb 12.6, Plts 326. Na 139, K 4.2, Glucose 97, Cr. 0.82. Patient stated she had not been taking Bystolic for the past 6 months due to cost. She also had not been taking her anxiety medications. Symptoms resolved so she was discharged from the ED.  Patient presents today for ED follow-up and pre-op evaluation for upcoming shoulder surgery. We talked about what led to both of her ED visits. She states in both instances, she was at the grocery store when she developed sudden onset of palpitations and then developed diffuse chest tightness, shortness of breath, lightheadedness, and dizziness. No falls or syncope with this. During the first episode, she also developed unilateral numbness/tingling. She states she has known PVCs and occasional has heart fluttering from this but never this severe. She also notes intermittent episodes of dizziness while at rest recently. She reports associated headaches with dizziness. She states this occurs almost every day. She tells me that she does not think these episodes were being driven by her anxiety as she was not particularly stressed at the time. She is very concerned about this and is worried about having anesthesia for upcoming shoulder surgery. She is able to complete >4.0 METS. She denies any other chest pain outside  the above episodes. She does note some shortness of breath recently both with activity and rest. She does note orthopnea and state she has slept on a incline for years. No recent PND. She endorses snoring but states she had a normal sleep study 1-2 years ago. She has microscopic hematuria noted on  urinalysis but no gross bleeding in urine or stools.    Past Medical History:  Diagnosis Date  . Anxiety   . Atrial fibrillation ([place])    a. occuring in 02/2016  . B12 deficiency   . Constipation   . Foreign body in foot, left 07/2013  . GERD (gastroesophageal reflux disease)   . Hematuria   . Irregular menses    due to oral contraceptive  . Palpitations    a. 01/2017: Event monitor showing SR with PVC's.   . Sinus tachycardia   . Thyroid nodule   . Vitamin D deficiency     Past Surgical History:  Procedure Laterality Date  . CESAREAN SECTION    . FOREIGN BODY REMOVAL Left 08/05/2013   Procedure: LEFT FOOT FOREIGN BODY REMOVAL ADULT;  Surgeon: Wylene Simmer, MD;  Location: [facility];  Service: Orthopedics;  Laterality: Left;  . SHOULDER ARTHROSCOPY Left   . TUBAL LIGATION      Current Medications: Current Meds  Medication Sig  . AMBULATORY NON FORMULARY MEDICATION salonpass patches with lidocaine may change every 8 hours as needed for pain.  [name] Kitchen dimenhyDRINATE (DRAMAMINE) 50 MG tablet Take 1 tablet (50 mg total) by mouth every 8 (eight) hours as needed.  Ozzie Hoyle 0.15-30 MG-MCG tablet TAKE 1 TABLET BY MOUTH DAILY.  [name] Kitchen levocetirizine (XYZAL) 5 MG tablet Take 1 tablet (5 mg total) by mouth every evening.  . nebivolol (BYSTOLIC) 2.5 MG tablet Take 1 tablet (2.5 mg total) by mouth daily as needed (palpatations).  . SUMAtriptan (IMITREX) 100 MG tablet Take 1 tablet (100 mg total) by mouth once as needed for up to 1 dose for migraine. May repeat in 2 hours if headache persists or recurs.  . valACYclovir (VALTREX) 1000 MG tablet Take 1 tablet (1,000 mg total) by mouth 2 (two) times daily.     Allergies:   Influenza vaccines, Vyvanse [lisdexamfetamine dimesylate], and Wellbutrin [bupropion]   Social History   Socioeconomic History  . Marital status: Divorced    Spouse name: Not on file  . Number of children: 3  . Years of education: Not on file  . Highest  education level: Not on file  Occupational History  . Occupation: Physiological scientist    Employer: [employer]  Tobacco Use  . Smoking status: Current Every Day Smoker    Years: 6.00    Types: Cigarettes  . Smokeless tobacco: Never Used  . Tobacco comment: 4-5 cigs/day  Substance and Sexual Activity  . Alcohol use: Yes    Comment: occasionally  . Drug use: No  . Sexual activity: Not on file    Comment: tubal ligation  Other Topics Concern  . Not on file  Social History Narrative  . Not on file   Social Determinants of Health   Financial Resource Strain:   . Difficulty of Paying Living Expenses: Not on file  Food Insecurity:   . Worried About Charity fundraiser in the Last Year: Not on file  . Ran Out of Food in the Last Year: Not on file  Transportation Needs:   . Lack of Transportation (Medical): Not on file  . Lack of  Transportation (Non-Medical): Not on file  Physical Activity:   . Days of Exercise per Week: Not on file  . Minutes of Exercise per Session: Not on file  Stress:   . Feeling of Stress : Not on file  Social Connections:   . Frequency of Communication with Friends and Family: Not on file  . Frequency of Social Gatherings with Friends and Family: Not on file  . Attends Religious Services: Not on file  . Active Member of Clubs or Organizations: Not on file  . Attends Archivist Meetings: Not on file  . Marital Status: Not on file     Family History: The patient's family history includes CAD in her father and sister; Diabetes in her mother; Heart attack in her father; Hypertension in her mother; Kidney disease in her mother. There is no history of Breast cancer.  ROS:   Please see the history of present illness.      EKGs/Labs/Other Studies Reviewed:    The following studies were reviewed today:  Echocardiogram 04/04/2016 ([facility]): Summary: - LV is normal in size, wall thickness and wall motion. EF 123456 - LV diastolic function  is normal. - RV systolic pressure is normal.  - Trace mitral regurgitation, tricuspid regurgitation, and pulmonic regurgitation noted. _______________  Cardiac Event Monitor 01/27/2017 [facility]): Cardiac event recorder reveals predominantly mild sinus tachycardia with fairly frequent but isolated PVCs. No other cardiac ectopy or cardiac arrhythmias were noted. Symptoms of palpitations tend to correlate with the isolated PVCs.  EKG:  EKG ordered today. EKG personally reviewed and demonstrates normal sinus rhythm, 93 bpm, with baseline wandering in lead I and III but no acute ST/T changes. Normal PR and QRS interval. QTc 447 ms.  Recent Labs: 02/11/2020: ALT 17; BUN 12; Creat 0.80; Hemoglobin 13.0; Platelets 336; Potassium 4.0; Sodium 140  Recent Lipid Panel    Component Value Date/Time   CHOL 182 09/02/2018 1435   CHOL 172 06/30/2017 0919   TRIG 240 (H) 09/02/2018 1435   HDL 52 09/02/2018 1435   HDL 58 06/30/2017 0919   CHOLHDL 3.5 09/02/2018 1435   LDLCALC 94 09/02/2018 1435    Physical Exam:    Vital Signs: BP 130/60   Temp (!) 97.2 F (36.2 C)   Ht 5\' 2"  (1.575 m)   Wt 187 lb 12.8 oz (85.2 kg)   SpO2 91%   BMI 34.35 kg/m     Wt Readings from Last 3 Encounters:  02/15/20 187 lb 12.8 oz (85.2 kg)  02/11/20 186 lb (84.4 kg)  01/31/20 184 lb (83.5 kg)     General: 44 y.o. African-American female in no acute distress. HEENT: Normocephalic and atraumatic.  Neck: Supple. No carotid bruits. No JVD. Heart: Mildly tachycardic with regular rhythm. Distinct S1 and S2. No murmurs, gallops, or rubs. Radial and posterior tibial pulses 2+ and equal bilaterally. Lungs: No increased work of breathing. Clear to ausculation bilaterally. No wheezes, rhonchi, or rales.  Abdomen: Soft, non-distended, and non-tender to palpation. Bowel sounds present. Extremities: No lower extremity edema.    Skin: Warm and dry. Neuro: Alert and oriented x3. No focal deficits. Psych: Normal affect. Responds  appropriately.  Assessment:    1. Pre-op evaluation   2. Paroxysmal atrial fibrillation (HCC)   3. Palpitations   4. Dizziness     Plan:    Pre-Op Evaluation - Patient has upcoming shoulder surgery planned (date to be determined). She has had 2 recent ED visits for palpitations with associated lightheadedness/dizziness, chest tightness,  and one occasional unilateral numbness/weakness. Work-up in the ED was unremarkable. However, she is very concerned about having anesthesia and states she is OK to wait for surgery if additional tests need to be done. Patient does have a history of anxiety and I wonder if this is playing a role in her symptoms. However, will plan for repeat Echo as well as 2 week Zio monitor given patient reports recent dyspnea and  almost daily dizziness even while at rest. - Recently had coronary calcium score of 0. She is able to complete >0.4 METS. Per Revised Cardiac Risk Index, considered very low risk with 0.4% chance of MACE. So if Echo and Monitor are unremarkable, patient would be at acceptable risk to proceed with surgery. Will notify requesting surgeon's office of this.  Paroxysmal Atrial Fibrillation/ Palpitations/ Dizziness - Patient had an episode of atrial fibrillation in 2017. Event monitor in 01/2017 showed sinus rhythm with PVCs.  - EKG today shows sinus rhythm with rate of 93 bpm; however, tachycardic on exam.  - Patient has had 2 recent ED visits due to palpitations. Will repeat Echo and Zio monitor as above.  - Patient prescribed Bystolic 2.5mg  daily but has not taken it in about 6 months due to cost. Previously unable to tolerated Metoprolol due to fatigue. Patient would like to try to stick with Bystolic as she does not have any side effects with this. She is going to try to use a discount card to see if that helps and will let us know if it is still too expensive. May need to consider switching to another beta-blocker or Cardizem if EF is normal.  -  CHA2DS2-VASc = 1 (female). Continue Aspirin 81mg  daily.  - Will also check TSH when she comes back for Echo. CBC/BMET in ED yesterday were unremarkable.  - Also advised following up with PCP.  Disposition: Follow up in 6 months with Dr. Stanford Breed. If Echo or Monitor are abnormal, may need to be seen sooner.    Medication Adjustments/Labs and Tests Ordered: Current medicines are reviewed at length with the patient today.  Concerns regarding medicines are outlined above.  Orders Placed This Encounter  Procedures  . LONG TERM MONITOR (3-14 DAYS)  . EKG 12-Lead  . ECHOCARDIOGRAM COMPLETE   No orders of the defined types were placed in this encounter.   Patient Instructions  Medication Instructions:  Your physician recommends that you continue on your current medications as directed. Please refer to the Current Medication list given to you today.  *If you need a refill on your cardiac medications before your next appointment, please call your pharmacy*   Testing/Procedures: Your physician has requested that you have an echocardiogram. Echocardiography is a painless test that uses sound waves to create images of your heart. It provides your doctor with information about the size and shape of your heart and how well your heart's chambers and valves are working. This procedure takes approximately one hour. There are no restrictions for this procedure.   ZIO XT- Long Term Monitor Instructions   Your physician has requested you wear your ZIO patch monitor 14 days.   This is a single patch monitor.  [employer] supplies one patch monitor per enrollment.  Additional stickers are not available.   Please do not apply patch if you will be having a Nuclear Stress Test, Echocardiogram, Cardiac CT, MRI, or Chest Xray during the time frame you would be wearing the monitor. The patch cannot be worn during these tests.  You  cannot remove and re-apply the ZIO XT patch monitor.   Your ZIO patch monitor will  be sent USPS Priority mail from [facility] directly to your home address. The monitor may also be mailed to a PO BOX if home delivery is not available.   It may take 3-5 days to receive your monitor after you have been enrolled.   Once you have received you monitor, please review enclosed instructions.  Your monitor has already been registered assigning a specific monitor serial # to you.   Applying the monitor   Shave hair from upper left chest.   Hold abrader disc by orange tab.  Rub abrader in 40 strokes over left upper chest as indicated in your monitor instructions.   Clean area with 4 enclosed alcohol pads .  Use all pads to assure are is cleaned thoroughly.  Let dry.   Apply patch as indicated in monitor instructions.  Patch will be place under collarbone on left side of chest with arrow pointing upward.   Rub patch adhesive wings for 2 minutes.Remove white label marked "1".  Remove white label marked "2".  Rub patch adhesive wings for 2 additional minutes.   While looking in a mirror, press and release button in center of patch.  A small green light will flash 3-4 times .  This will be your only indicator the monitor has been turned on.     Do not shower for the first 24 hours.  You may shower after the first 24 hours.   Press button if you feel a symptom. You will hear a small click.  Record Date, Time and Symptom in the Patient Log Book.   When you are ready to remove patch, follow instructions on last 2 pages of Patient Log Book.  Stick patch monitor onto last page of Patient Log Book.   Place Patient Log Book in [place] box.  Use locking tab on box and tape box closed securely.  The [employer] has [employer] on it.  Please place in mailbox as soon as possible.  Your physician should have your test results approximately 7 days after the monitor has been mailed back to [facility].   Call [place] at [phone] if you have questions  regarding your ZIO XT patch monitor.  Call them immediately if you see an orange light blinking on your monitor.   If your monitor falls off in less than 4 days contact our Monitor department at [phone].  If your monitor becomes loose or falls off after 4 days call [employer] at [phone] for suggestions on securing your monitor.     Follow-Up: At [facility], you and your health needs are our priority.  As part of our continuing mission to provide you with exceptional heart care, we have created designated Provider Care Teams.  These Care Teams include your primary Cardiologist (physician) and Advanced Practice Providers (APPs -  Physician Assistants and Nurse Practitioners) who all work together to provide you with the care you need, when you need it.  We recommend signing up for the patient portal called "MyChart".  Sign up information is provided on this After Visit Summary.  MyChart is used to connect with patients for Virtual Visits (Telemedicine).  Patients are able to view lab/test results, encounter notes, upcoming appointments, etc.  Non-urgent messages can be sent to your provider as well.   To learn more about what you can do with MyChart, go to [email or web address].  Your next appointment:   6 month(s)  The format for your next appointment:   In Person  Provider:   Kirk Ruths, MD   Other Instructions Please call our office if you are not able to get Bystolic.     Signed, Darreld Mclean, PA-C  02/15/2020 5:22 PM    [facility]

## 2020-02-14 ENCOUNTER — Encounter: Payer: Self-pay | Admitting: Physician Assistant

## 2020-02-14 ENCOUNTER — Encounter: Payer: Self-pay | Admitting: Neurology

## 2020-02-14 DIAGNOSIS — R410 Disorientation, unspecified: Secondary | ICD-10-CM | POA: Diagnosis not present

## 2020-02-14 DIAGNOSIS — R531 Weakness: Secondary | ICD-10-CM | POA: Diagnosis not present

## 2020-02-14 DIAGNOSIS — R519 Headache, unspecified: Secondary | ICD-10-CM | POA: Diagnosis not present

## 2020-02-14 DIAGNOSIS — E669 Obesity, unspecified: Secondary | ICD-10-CM | POA: Diagnosis not present

## 2020-02-14 DIAGNOSIS — Z887 Allergy status to serum and vaccine status: Secondary | ICD-10-CM | POA: Diagnosis not present

## 2020-02-14 DIAGNOSIS — R42 Dizziness and giddiness: Secondary | ICD-10-CM | POA: Diagnosis not present

## 2020-02-14 DIAGNOSIS — F17210 Nicotine dependence, cigarettes, uncomplicated: Secondary | ICD-10-CM | POA: Diagnosis not present

## 2020-02-14 DIAGNOSIS — R000 Tachycardia, unspecified: Secondary | ICD-10-CM | POA: Diagnosis not present

## 2020-02-14 DIAGNOSIS — F419 Anxiety disorder, unspecified: Secondary | ICD-10-CM | POA: Diagnosis not present

## 2020-02-14 DIAGNOSIS — R0602 Shortness of breath: Secondary | ICD-10-CM | POA: Diagnosis not present

## 2020-02-14 DIAGNOSIS — Z888 Allergy status to other drugs, medicaments and biological substances status: Secondary | ICD-10-CM | POA: Diagnosis not present

## 2020-02-14 DIAGNOSIS — Z6834 Body mass index (BMI) 34.0-34.9, adult: Secondary | ICD-10-CM | POA: Diagnosis not present

## 2020-02-14 DIAGNOSIS — E538 Deficiency of other specified B group vitamins: Secondary | ICD-10-CM | POA: Diagnosis not present

## 2020-02-14 DIAGNOSIS — Z79899 Other long term (current) drug therapy: Secondary | ICD-10-CM | POA: Diagnosis not present

## 2020-02-14 DIAGNOSIS — R079 Chest pain, unspecified: Secondary | ICD-10-CM | POA: Diagnosis not present

## 2020-02-14 DIAGNOSIS — E559 Vitamin D deficiency, unspecified: Secondary | ICD-10-CM | POA: Diagnosis not present

## 2020-02-14 DIAGNOSIS — R0789 Other chest pain: Secondary | ICD-10-CM | POA: Diagnosis not present

## 2020-02-14 NOTE — Progress Notes (Signed)
Kelsey Vaughan,   Labs look great. Will send note to orthopedic office for release.   [name],   Can we print off a letter releasing patient for surgery on her right shoulder medically. Cardiology release will be separate. We can also print these labs to send over with.

## 2020-02-15 ENCOUNTER — Other Ambulatory Visit: Payer: Self-pay

## 2020-02-15 ENCOUNTER — Encounter: Payer: Self-pay | Admitting: *Deleted

## 2020-02-15 ENCOUNTER — Ambulatory Visit (INDEPENDENT_AMBULATORY_CARE_PROVIDER_SITE_OTHER): Payer: BC Managed Care – PPO | Admitting: Student

## 2020-02-15 ENCOUNTER — Encounter: Payer: Self-pay | Admitting: Student

## 2020-02-15 VITALS — BP 130/60 | Temp 97.2°F | Ht 62.0 in | Wt 187.8 lb

## 2020-02-15 DIAGNOSIS — I480 Paroxysmal atrial fibrillation: Secondary | ICD-10-CM | POA: Diagnosis not present

## 2020-02-15 DIAGNOSIS — Z01818 Encounter for other preprocedural examination: Secondary | ICD-10-CM

## 2020-02-15 DIAGNOSIS — R002 Palpitations: Secondary | ICD-10-CM

## 2020-02-15 DIAGNOSIS — R42 Dizziness and giddiness: Secondary | ICD-10-CM

## 2020-02-15 NOTE — Progress Notes (Signed)
Patient ID: Kelsey Vaughan, female   DOB: Mar 04, 1976, 44 y.o.   MRN: [id number] Patient enrolled for [employer] to mail a 14 day ZIO XT long term holter monitor to her home.

## 2020-02-15 NOTE — Patient Instructions (Signed)
Medication Instructions:  Your physician recommends that you continue on your current medications as directed. Please refer to the Current Medication list given to you today.  *If you need a refill on your cardiac medications before your next appointment, please call your pharmacy*   Testing/Procedures: Your physician has requested that you have an echocardiogram. Echocardiography is a painless test that uses sound waves to create images of your heart. It provides your doctor with information about the size and shape of your heart and how well your heart's chambers and valves are working. This procedure takes approximately one hour. There are no restrictions for this procedure.   ZIO XT- Long Term Monitor Instructions   Your physician has requested you wear your ZIO patch monitor 14 days.   This is a single patch monitor.  [employer] supplies one patch monitor per enrollment.  Additional stickers are not available.   Please do not apply patch if you will be having a Nuclear Stress Test, Echocardiogram, Cardiac CT, MRI, or Chest Xray during the time frame you would be wearing the monitor. The patch cannot be worn during these tests.  You cannot remove and re-apply the ZIO XT patch monitor.   Your ZIO patch monitor will be sent USPS Priority mail from [facility] directly to your home address. The monitor may also be mailed to a PO BOX if home delivery is not available.   It may take 3-5 days to receive your monitor after you have been enrolled.   Once you have received you monitor, please review enclosed instructions.  Your monitor has already been registered assigning a specific monitor serial # to you.   Applying the monitor   Shave hair from upper left chest.   Hold abrader disc by orange tab.  Rub abrader in 40 strokes over left upper chest as indicated in your monitor instructions.   Clean area with 4 enclosed alcohol pads .  Use all pads to assure are is cleaned thoroughly.   Let dry.   Apply patch as indicated in monitor instructions.  Patch will be place under collarbone on left side of chest with arrow pointing upward.   Rub patch adhesive wings for 2 minutes.Remove white label marked "1".  Remove white label marked "2".  Rub patch adhesive wings for 2 additional minutes.   While looking in a mirror, press and release button in center of patch.  A small green light will flash 3-4 times .  This will be your only indicator the monitor has been turned on.     Do not shower for the first 24 hours.  You may shower after the first 24 hours.   Press button if you feel a symptom. You will hear a small click.  Record Date, Time and Symptom in the Patient Log Book.   When you are ready to remove patch, follow instructions on last 2 pages of Patient Log Book.  Stick patch monitor onto last page of Patient Log Book.   Place Patient Log Book in [employer] box.  Use locking tab on box and tape box closed securely.  The [employer] has [employer] on it.  Please place in mailbox as soon as possible.  Your physician should have your test results approximately 7 days after the monitor has been mailed back to [facility].   Call [place] at [phone] if you have questions regarding your ZIO XT patch monitor.  Call them immediately if you see an orange  light blinking on your monitor.   If your monitor falls off in less than 4 days contact our Monitor department at [phone].  If your monitor becomes loose or falls off after 4 days call [employer] at [phone] for suggestions on securing your monitor.     Follow-Up: At [facility], you and your health needs are our priority.  As part of our continuing mission to provide you with exceptional heart care, we have created designated Provider Care Teams.  These Care Teams include your primary Cardiologist (physician) and Advanced Practice Providers (APPs -  Physician Assistants and Nurse  Practitioners) who all work together to provide you with the care you need, when you need it.  We recommend signing up for the patient portal called "MyChart".  Sign up information is provided on this After Visit Summary.  MyChart is used to connect with patients for Virtual Visits (Telemedicine).  Patients are able to view lab/test results, encounter notes, upcoming appointments, etc.  Non-urgent messages can be sent to your provider as well.   To learn more about what you can do with MyChart, go to [email or web address].    Your next appointment:   6 month(s)  The format for your next appointment:   In Person  Provider:   Kirk Ruths, MD   Other Instructions Please call our office if you are not able to get Bystolic.

## 2020-02-16 ENCOUNTER — Telehealth: Payer: Self-pay | Admitting: Student

## 2020-02-16 ENCOUNTER — Telehealth (INDEPENDENT_AMBULATORY_CARE_PROVIDER_SITE_OTHER): Payer: BC Managed Care – PPO | Admitting: Physician Assistant

## 2020-02-16 ENCOUNTER — Ambulatory Visit (HOSPITAL_COMMUNITY)
Admission: RE | Admit: 2020-02-16 | Discharge: 2020-02-16 | Disposition: A | Discharge status: Home / Self Care | Payer: BC Managed Care – PPO | Source: Ambulatory Visit | Attending: Physician Assistant | Admitting: Physician Assistant

## 2020-02-16 DIAGNOSIS — R002 Palpitations: Secondary | ICD-10-CM

## 2020-02-16 DIAGNOSIS — R3129 Other microscopic hematuria: Secondary | ICD-10-CM | POA: Diagnosis not present

## 2020-02-16 DIAGNOSIS — R29818 Other symptoms and signs involving the nervous system: Secondary | ICD-10-CM

## 2020-02-16 DIAGNOSIS — R410 Disorientation, unspecified: Secondary | ICD-10-CM | POA: Diagnosis not present

## 2020-02-16 DIAGNOSIS — I491 Atrial premature depolarization: Secondary | ICD-10-CM

## 2020-02-16 DIAGNOSIS — R42 Dizziness and giddiness: Secondary | ICD-10-CM | POA: Diagnosis not present

## 2020-02-16 DIAGNOSIS — N039 Chronic nephritic syndrome with unspecified morphologic changes: Secondary | ICD-10-CM | POA: Diagnosis not present

## 2020-02-16 DIAGNOSIS — R801 Persistent proteinuria, unspecified: Secondary | ICD-10-CM | POA: Diagnosis not present

## 2020-02-16 DIAGNOSIS — R519 Headache, unspecified: Secondary | ICD-10-CM

## 2020-02-16 DIAGNOSIS — I479 Paroxysmal tachycardia, unspecified: Secondary | ICD-10-CM

## 2020-02-16 MED ORDER — NEBIVOLOL HCL 2.5 MG PO TABS: 2.5000 mg | ORAL_TABLET | Freq: Every day | ORAL | 3 refills | Status: DC | PRN

## 2020-02-16 MED ORDER — UBRELVY 100 MG PO TABS: 1.0000 | ORAL_TABLET | ORAL | 5 refills | Status: DC | PRN

## 2020-02-16 MED ORDER — ASPIRIN EC 81 MG PO TBEC: 81.0000 mg | DELAYED_RELEASE_TABLET | Freq: Every day | ORAL | Status: AC

## 2020-02-16 MED ORDER — GADOBUTROL 1 MMOL/ML IV SOLN
10.0000 mL | Freq: Once | INTRAVENOUS | Status: AC | PRN
  Administered 2020-02-16: 8 mL via INTRAVENOUS

## 2020-02-16 MED ORDER — AIMOVIG 140 MG/ML ~~LOC~~ SOAJ: 140.0000 mg | SUBCUTANEOUS | 5 refills | Status: DC

## 2020-02-16 NOTE — Progress Notes (Signed)
Called patient and she is aware of results.
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
## 2020-02-16 NOTE — Telephone Encounter (Signed)
   Called to follow-up on office visit yesterday. Patient reported she is no longer taking a Aspirin 81mg  daily. She does have a history of paroxysmal atrial fibrillation. CHA2DS2-VASc = 1 (female). During last visit with Dr. Stanford Breed, he recommended continuing Aspirin 81mg  daily. Advised patient of this and she stated that she would restart it. Also discussed need for TSH. She states her PCP has already ordered this, and she will likely get lab drawn this week. Will follow back up on this. If TSH has not been drawn by the time she comes back for her Echo on 02/28/2020, I informed her that we could check it at that time. Patient thanked me for calling.  Darreld Mclean, PA-C 02/16/2020 8:19 AM

## 2020-02-17 ENCOUNTER — Telehealth: Payer: Self-pay | Admitting: Physician Assistant

## 2020-02-17 ENCOUNTER — Other Ambulatory Visit: Payer: Self-pay | Admitting: Orthopedic Surgery

## 2020-02-17 NOTE — Telephone Encounter (Signed)
Received fax for PA on Ubrelvy sent through cover my meds and received authorization.   Case ID: [id number] Valid: 01/18/20 - 02/16/21 - CF

## 2020-02-17 NOTE — Telephone Encounter (Signed)
Received fax for PA on Aimovig sent through cover my meds and received authorization.   Case Id: [id number] Valid: 01/18/20 - 03/04/222 - CF

## 2020-02-18 ENCOUNTER — Encounter: Payer: Self-pay | Admitting: Physician Assistant

## 2020-02-18 ENCOUNTER — Other Ambulatory Visit (INDEPENDENT_AMBULATORY_CARE_PROVIDER_SITE_OTHER): Payer: BC Managed Care – PPO

## 2020-02-18 DIAGNOSIS — R002 Palpitations: Secondary | ICD-10-CM | POA: Diagnosis not present

## 2020-02-18 DIAGNOSIS — Z01818 Encounter for other preprocedural examination: Secondary | ICD-10-CM

## 2020-02-18 DIAGNOSIS — I480 Paroxysmal atrial fibrillation: Secondary | ICD-10-CM

## 2020-02-18 MED FILL — AIMOVIG 140 MG/ML SOAJ: 140 | 30 days supply | Qty: 1 | Fill #0

## 2020-02-18 MED FILL — UBRELVY 100 MG TABS: 100 | 16 days supply | Qty: 9 | Fill #0

## 2020-02-23 ENCOUNTER — Telehealth: Payer: Self-pay | Admitting: Neurology

## 2020-02-23 NOTE — Telephone Encounter (Signed)
[employer] patient assistance program approved through 02/21/2021. Patient's medication will be shipped directly to our office. When time to reorder meds we have to submit by mail/phone/fax. [employer] patient assistance [place] [address] [place] [address] Ph- [phone] Fax - [phone]  [facility]

## 2020-02-23 NOTE — Telephone Encounter (Signed)
Letter states they also sent correspondence to patient.

## 2020-02-23 NOTE — Telephone Encounter (Signed)
You are the BEST. Does patient know?

## 2020-02-25 ENCOUNTER — Other Ambulatory Visit: Payer: Self-pay

## 2020-02-25 ENCOUNTER — Encounter (HOSPITAL_BASED_OUTPATIENT_CLINIC_OR_DEPARTMENT_OTHER): Payer: Self-pay | Admitting: Orthopedic Surgery

## 2020-02-25 ENCOUNTER — Other Ambulatory Visit: Payer: Self-pay | Admitting: Student

## 2020-02-25 DIAGNOSIS — R002 Palpitations: Secondary | ICD-10-CM

## 2020-02-25 NOTE — Progress Notes (Signed)
It does not look like patient ever had TSH drawn at PCP. So will place order so that this can be done when patient comes in for Echo on 02/28/2020.

## 2020-02-28 ENCOUNTER — Other Ambulatory Visit: Payer: Self-pay

## 2020-02-28 ENCOUNTER — Ambulatory Visit (HOSPITAL_COMMUNITY): Payer: BC Managed Care – PPO | Attending: Internal Medicine

## 2020-02-28 DIAGNOSIS — I480 Paroxysmal atrial fibrillation: Secondary | ICD-10-CM | POA: Diagnosis not present

## 2020-02-28 DIAGNOSIS — Z01818 Encounter for other preprocedural examination: Secondary | ICD-10-CM

## 2020-02-28 DIAGNOSIS — R002 Palpitations: Secondary | ICD-10-CM

## 2020-02-28 DIAGNOSIS — Z01810 Encounter for preprocedural cardiovascular examination: Secondary | ICD-10-CM | POA: Diagnosis not present

## 2020-03-02 ENCOUNTER — Other Ambulatory Visit (HOSPITAL_COMMUNITY)
Admission: RE | Admit: 2020-03-02 | Discharge: 2020-03-02 | Disposition: A | Discharge status: Home / Self Care | Payer: Worker's Compensation | Source: Ambulatory Visit | Attending: Orthopedic Surgery | Admitting: Orthopedic Surgery

## 2020-03-02 ENCOUNTER — Telehealth: Payer: Self-pay | Admitting: Physician Assistant

## 2020-03-02 ENCOUNTER — Telehealth: Payer: Self-pay | Admitting: Student

## 2020-03-02 DIAGNOSIS — Z20822 Contact with and (suspected) exposure to covid-19: Secondary | ICD-10-CM | POA: Diagnosis not present

## 2020-03-02 DIAGNOSIS — M7541 Impingement syndrome of right shoulder: Secondary | ICD-10-CM | POA: Diagnosis not present

## 2020-03-02 DIAGNOSIS — Z7982 Long term (current) use of aspirin: Secondary | ICD-10-CM | POA: Diagnosis not present

## 2020-03-02 DIAGNOSIS — Z6834 Body mass index (BMI) 34.0-34.9, adult: Secondary | ICD-10-CM | POA: Diagnosis not present

## 2020-03-02 DIAGNOSIS — Z79899 Other long term (current) drug therapy: Secondary | ICD-10-CM | POA: Diagnosis not present

## 2020-03-02 DIAGNOSIS — Z793 Long term (current) use of hormonal contraceptives: Secondary | ICD-10-CM | POA: Diagnosis not present

## 2020-03-02 DIAGNOSIS — E669 Obesity, unspecified: Secondary | ICD-10-CM | POA: Diagnosis not present

## 2020-03-02 DIAGNOSIS — M75111 Incomplete rotator cuff tear or rupture of right shoulder, not specified as traumatic: Secondary | ICD-10-CM | POA: Diagnosis present

## 2020-03-02 DIAGNOSIS — F17210 Nicotine dependence, cigarettes, uncomplicated: Secondary | ICD-10-CM | POA: Diagnosis not present

## 2020-03-02 DIAGNOSIS — M19011 Primary osteoarthritis, right shoulder: Secondary | ICD-10-CM | POA: Diagnosis not present

## 2020-03-02 LAB — SARS CORONAVIRUS 2 (TAT 6-24 HRS): SARS Coronavirus 2: NEGATIVE

## 2020-03-02 NOTE — Telephone Encounter (Signed)
Per patient this note faxed to [facility] @ [phone].

## 2020-03-02 NOTE — Telephone Encounter (Signed)
Ok for surgery Kelsey Vaughan  

## 2020-03-02 NOTE — Telephone Encounter (Signed)
Left message for patient to call, need to know who to send clearance to.

## 2020-03-02 NOTE — Telephone Encounter (Signed)
Received a call from [facility], requesting surgical clearance.  Patient was getting at least 4 METS of activity per office visit note.  Echocardiogram showed LV function of 55 to 60% with grade 1 diastolic dysfunction.  Patient was only able to wore monitor for 3 days due to skin irritation per note.  "[facility] for surgery Kirk Ruths"   I will route this recommendation to the requesting party via Epic fax function and remove from pre-op pool.  Please call with questions.  [place], [place] 03/02/2020, 4:00 PM

## 2020-03-02 NOTE — Telephone Encounter (Signed)
Spoke with pt, she is just mailing the monitor back today. According to the app note when the patient was seen, clearance will be based on echo and monitor results. The patient does not want to reschedule surgery and would like to know if she can be cleared for surgery. Will forward for dr Stanford Breed review

## 2020-03-02 NOTE — Telephone Encounter (Signed)
Patient states she got a call today informing her she is not cleared for her surgery 03/06/2020. She would like to discuss why and states she only wore the heart monitor for 3 days because it irritated her skin. She says she will mail it out today, but does not want to have to reschedule her surgery. Please advise.

## 2020-03-03 NOTE — Progress Notes (Signed)
Chart and cardiac clearance reviewed with Dr. Kalman Shan. MD stated okay to proceed with surgery and no further clearance needed.

## 2020-03-06 ENCOUNTER — Other Ambulatory Visit: Payer: Self-pay

## 2020-03-06 ENCOUNTER — Ambulatory Visit (HOSPITAL_BASED_OUTPATIENT_CLINIC_OR_DEPARTMENT_OTHER): Payer: No Typology Code available for payment source | Admitting: Certified Registered Nurse Anesthetist

## 2020-03-06 ENCOUNTER — Encounter (HOSPITAL_BASED_OUTPATIENT_CLINIC_OR_DEPARTMENT_OTHER): Payer: Self-pay | Admitting: Orthopedic Surgery

## 2020-03-06 ENCOUNTER — Ambulatory Visit (HOSPITAL_COMMUNITY)
Admission: RE | Admit: 2020-03-06 | Discharge: 2020-03-06 | Disposition: A | Discharge status: Home / Self Care | Payer: No Typology Code available for payment source | Attending: Orthopedic Surgery | Admitting: Orthopedic Surgery

## 2020-03-06 ENCOUNTER — Encounter (HOSPITAL_BASED_OUTPATIENT_CLINIC_OR_DEPARTMENT_OTHER)
Admission: RE | Disposition: A | Discharge status: Home / Self Care | Payer: Self-pay | Source: Home / Self Care | Attending: Orthopedic Surgery

## 2020-03-06 DIAGNOSIS — Z20822 Contact with and (suspected) exposure to covid-19: Secondary | ICD-10-CM | POA: Insufficient documentation

## 2020-03-06 DIAGNOSIS — Z6834 Body mass index (BMI) 34.0-34.9, adult: Secondary | ICD-10-CM | POA: Insufficient documentation

## 2020-03-06 DIAGNOSIS — M7541 Impingement syndrome of right shoulder: Secondary | ICD-10-CM | POA: Insufficient documentation

## 2020-03-06 DIAGNOSIS — Z7982 Long term (current) use of aspirin: Secondary | ICD-10-CM | POA: Insufficient documentation

## 2020-03-06 DIAGNOSIS — Z793 Long term (current) use of hormonal contraceptives: Secondary | ICD-10-CM | POA: Insufficient documentation

## 2020-03-06 DIAGNOSIS — Z79899 Other long term (current) drug therapy: Secondary | ICD-10-CM | POA: Insufficient documentation

## 2020-03-06 DIAGNOSIS — M19011 Primary osteoarthritis, right shoulder: Secondary | ICD-10-CM | POA: Diagnosis not present

## 2020-03-06 DIAGNOSIS — F17210 Nicotine dependence, cigarettes, uncomplicated: Secondary | ICD-10-CM | POA: Insufficient documentation

## 2020-03-06 DIAGNOSIS — E669 Obesity, unspecified: Secondary | ICD-10-CM | POA: Insufficient documentation

## 2020-03-06 DIAGNOSIS — M75111 Incomplete rotator cuff tear or rupture of right shoulder, not specified as traumatic: Secondary | ICD-10-CM | POA: Insufficient documentation

## 2020-03-06 HISTORY — DX: Nausea with vomiting, unspecified: Z98.890

## 2020-03-06 HISTORY — DX: Other complications of anesthesia, initial encounter: T88.59XA

## 2020-03-06 HISTORY — DX: Nausea with vomiting, unspecified: R11.2

## 2020-03-06 LAB — POCT PREGNANCY, URINE: Preg Test, Ur: NEGATIVE

## 2020-03-06 SURGERY — SHOULDER ARTHROSCOPY WITH SUBACROMIAL DECOMPRESSION AND DISTAL CLAVICLE EXCISION
Anesthesia: General | Site: Shoulder | Laterality: Right

## 2020-03-06 MED ORDER — DEXAMETHASONE SODIUM PHOSPHATE 10 MG/ML IJ SOLN
INTRAMUSCULAR | Status: DC | PRN
  Administered 2020-03-06: 5 mg via INTRAVENOUS

## 2020-03-06 MED ORDER — DIPHENHYDRAMINE HCL 50 MG/ML IJ SOLN
INTRAMUSCULAR | Status: DC | PRN
  Administered 2020-03-06: 12.5 mg via INTRAVENOUS

## 2020-03-06 MED ORDER — SODIUM CHLORIDE 0.9 % IR SOLN
Status: DC | PRN
  Administered 2020-03-06: 6000 mL

## 2020-03-06 MED ORDER — PROMETHAZINE HCL 25 MG/ML IJ SOLN
INTRAMUSCULAR | Status: AC
  Filled 2020-03-06: qty 1

## 2020-03-06 MED ORDER — FENTANYL CITRATE (PF) 100 MCG/2ML IJ SOLN: 100.0000 ug | Freq: Once | INTRAMUSCULAR | Status: DC

## 2020-03-06 MED ORDER — DIPHENHYDRAMINE HCL 50 MG/ML IJ SOLN
INTRAMUSCULAR | Status: AC
  Filled 2020-03-06: qty 1

## 2020-03-06 MED ORDER — FENTANYL CITRATE (PF) 100 MCG/2ML IJ SOLN: 25.0000 ug | INTRAMUSCULAR | Status: DC | PRN

## 2020-03-06 MED ORDER — FENTANYL CITRATE (PF) 100 MCG/2ML IJ SOLN
INTRAMUSCULAR | Status: AC
  Filled 2020-03-06: qty 2

## 2020-03-06 MED ORDER — SUCCINYLCHOLINE CHLORIDE 200 MG/10ML IV SOSY
PREFILLED_SYRINGE | INTRAVENOUS | Status: AC
  Filled 2020-03-06: qty 10

## 2020-03-06 MED ORDER — LIDOCAINE 2% (20 MG/ML) 5 ML SYRINGE
INTRAMUSCULAR | Status: DC | PRN
  Administered 2020-03-06: 60 mg via INTRAVENOUS

## 2020-03-06 MED ORDER — ONDANSETRON HCL 4 MG/2ML IJ SOLN
INTRAMUSCULAR | Status: DC | PRN
  Administered 2020-03-06: 4 mg via INTRAVENOUS

## 2020-03-06 MED ORDER — OXYCODONE HCL 5 MG PO TABS: 5.0000 mg | ORAL_TABLET | Freq: Once | ORAL | Status: DC | PRN

## 2020-03-06 MED ORDER — MIDAZOLAM HCL 2 MG/2ML IJ SOLN
INTRAMUSCULAR | Status: AC
  Filled 2020-03-06: qty 2

## 2020-03-06 MED ORDER — TIZANIDINE HCL 4 MG PO TABS: 4.0000 mg | ORAL_TABLET | Freq: Three times a day (TID) | ORAL | 1 refills | Status: DC | PRN

## 2020-03-06 MED ORDER — PROPOFOL 10 MG/ML IV BOLUS
INTRAVENOUS | Status: AC
  Filled 2020-03-06: qty 20

## 2020-03-06 MED ORDER — PROMETHAZINE HCL 12.5 MG PO TABS: 12.5000 mg | ORAL_TABLET | Freq: Four times a day (QID) | ORAL | 0 refills | Status: DC | PRN

## 2020-03-06 MED ORDER — FENTANYL CITRATE (PF) 100 MCG/2ML IJ SOLN
50.0000 ug | INTRAMUSCULAR | Status: DC | PRN
  Administered 2020-03-06: 100 ug via INTRAVENOUS

## 2020-03-06 MED ORDER — PROPOFOL 500 MG/50ML IV EMUL
INTRAVENOUS | Status: DC | PRN
  Administered 2020-03-06: 25 ug/kg/min via INTRAVENOUS

## 2020-03-06 MED ORDER — CEFAZOLIN SODIUM-DEXTROSE 2-4 GM/100ML-% IV SOLN
2.0000 g | INTRAVENOUS | Status: AC
  Administered 2020-03-06: 2 g via INTRAVENOUS

## 2020-03-06 MED ORDER — CHLORHEXIDINE GLUCONATE 4 % EX LIQD: 60.0000 mL | Freq: Once | CUTANEOUS | Status: DC

## 2020-03-06 MED ORDER — LIDOCAINE 2% (20 MG/ML) 5 ML SYRINGE
INTRAMUSCULAR | Status: AC
  Filled 2020-03-06: qty 5

## 2020-03-06 MED ORDER — ONDANSETRON HCL 4 MG/2ML IJ SOLN: 4.0000 mg | Freq: Four times a day (QID) | INTRAMUSCULAR | Status: DC | PRN

## 2020-03-06 MED ORDER — PROPOFOL 10 MG/ML IV BOLUS
INTRAVENOUS | Status: DC | PRN
  Administered 2020-03-06: 150 mg via INTRAVENOUS
  Administered 2020-03-06: 50 mg via INTRAVENOUS

## 2020-03-06 MED ORDER — BUPIVACAINE LIPOSOME 1.3 % IJ SUSP
INTRAMUSCULAR | Status: DC | PRN
  Administered 2020-03-06: 10 mL via PERINEURAL

## 2020-03-06 MED ORDER — HYDROCODONE-ACETAMINOPHEN 5-325 MG PO TABS: 1.0000 | ORAL_TABLET | Freq: Four times a day (QID) | ORAL | 0 refills | Status: DC | PRN

## 2020-03-06 MED ORDER — OXYCODONE HCL 5 MG/5ML PO SOLN: 5.0000 mg | Freq: Once | ORAL | Status: DC | PRN

## 2020-03-06 MED ORDER — SCOPOLAMINE 1 MG/3DAYS TD PT72
1.0000 | MEDICATED_PATCH | TRANSDERMAL | Status: DC
  Administered 2020-03-06: 1.5 mg via TRANSDERMAL

## 2020-03-06 MED ORDER — ONDANSETRON HCL 4 MG/2ML IJ SOLN
INTRAMUSCULAR | Status: AC
  Filled 2020-03-06: qty 2

## 2020-03-06 MED ORDER — CEFAZOLIN SODIUM-DEXTROSE 2-4 GM/100ML-% IV SOLN
INTRAVENOUS | Status: AC
  Filled 2020-03-06: qty 100

## 2020-03-06 MED ORDER — SCOPOLAMINE 1 MG/3DAYS TD PT72
MEDICATED_PATCH | TRANSDERMAL | Status: AC
  Filled 2020-03-06: qty 1

## 2020-03-06 MED ORDER — LACTATED RINGERS IV SOLN: INTRAVENOUS | Status: DC

## 2020-03-06 MED ORDER — MIDAZOLAM HCL 2 MG/2ML IJ SOLN: 1.0000 mg | INTRAMUSCULAR | Status: DC | PRN

## 2020-03-06 MED ORDER — PHENYLEPHRINE 40 MCG/ML (10ML) SYRINGE FOR IV PUSH (FOR BLOOD PRESSURE SUPPORT)
PREFILLED_SYRINGE | INTRAVENOUS | Status: DC | PRN
  Administered 2020-03-06 (×4): 80 ug via INTRAVENOUS

## 2020-03-06 MED ORDER — PROMETHAZINE HCL 25 MG/ML IJ SOLN
6.2500 mg | Freq: Once | INTRAMUSCULAR | Status: AC
  Administered 2020-03-06: 6.25 mg via INTRAVENOUS

## 2020-03-06 MED ORDER — SUCCINYLCHOLINE CHLORIDE 200 MG/10ML IV SOSY
PREFILLED_SYRINGE | INTRAVENOUS | Status: DC | PRN
  Administered 2020-03-06: 100 mg via INTRAVENOUS

## 2020-03-06 MED ORDER — DEXAMETHASONE SODIUM PHOSPHATE 10 MG/ML IJ SOLN
INTRAMUSCULAR | Status: AC
  Filled 2020-03-06: qty 1

## 2020-03-06 MED ORDER — GLYCOPYRROLATE PF 0.2 MG/ML IJ SOSY
PREFILLED_SYRINGE | INTRAMUSCULAR | Status: AC
  Filled 2020-03-06: qty 1

## 2020-03-06 MED ORDER — BUPIVACAINE HCL (PF) 0.5 % IJ SOLN
INTRAMUSCULAR | Status: DC | PRN
  Administered 2020-03-06: 15 mL via PERINEURAL

## 2020-03-06 MED ORDER — MIDAZOLAM HCL 2 MG/2ML IJ SOLN
2.0000 mg | Freq: Once | INTRAMUSCULAR | Status: AC
  Administered 2020-03-06: 2 mg via INTRAVENOUS

## 2020-03-06 MED ORDER — FENTANYL CITRATE (PF) 250 MCG/5ML IJ SOLN
INTRAMUSCULAR | Status: DC | PRN
  Administered 2020-03-06 (×2): 50 ug via INTRAVENOUS

## 2020-03-06 MED FILL — tiZANidine HCL 4 MG TABS: 4 | 10 days supply | Qty: 30 | Fill #0

## 2020-03-06 MED FILL — PROMETHAZINE 12.5 MG TABLET: 12.5 | 4 days supply | Qty: 15 | Fill #0

## 2020-03-06 MED FILL — HYDROCODON-APAP 5-325: 5-325 | 3 days supply | Qty: 20 | Fill #0

## 2020-03-06 SURGICAL SUPPLY — 74 items
BENZOIN TINCTURE PRP APPL 2/3 (GAUZE/BANDAGES/DRESSINGS) IMPLANT
BLADE CLIPPER SURG (BLADE) IMPLANT
BURR OVAL 8 FLU 4.0X13 (MISCELLANEOUS) ×3 IMPLANT
CANNULA 5.75X7 CRYSTAL CLEAR (CANNULA) ×3 IMPLANT
CANNULA TWIST IN 8.25X7CM (CANNULA) IMPLANT
CHLORAPREP W/TINT 26 (MISCELLANEOUS) ×3 IMPLANT
COVER WAND RF STERILE (DRAPES) IMPLANT
CUTTER BONE 4.0MM X 13CM (MISCELLANEOUS) ×3 IMPLANT
DECANTER SPIKE VIAL GLASS SM (MISCELLANEOUS) IMPLANT
DERMABOND ADVANCED (GAUZE/BANDAGES/DRESSINGS)
DERMABOND ADVANCED .7 DNX12 (GAUZE/BANDAGES/DRESSINGS) IMPLANT
DRAPE IMP U-DRAPE 54X76 (DRAPES) ×3 IMPLANT
DRAPE INCISE IOBAN 66X45 STRL (DRAPES) ×3 IMPLANT
DRAPE STERI 35X30 U-POUCH (DRAPES) ×3 IMPLANT
DRAPE SURG 17X23 STRL (DRAPES) ×3 IMPLANT
DRAPE U-SHAPE 47X51 STRL (DRAPES) ×3 IMPLANT
DRAPE U-SHAPE 76X120 STRL (DRAPES) ×6 IMPLANT
DRSG PAD ABDOMINAL 8X10 ST (GAUZE/BANDAGES/DRESSINGS) ×3 IMPLANT
ELECT BLADE 4.0 EZ CLEAN MEGAD (MISCELLANEOUS) ×3
ELECT REM PT RETURN 9FT ADLT (ELECTROSURGICAL) ×3
ELECTRODE BLDE 4.0 EZ CLN MEGD (MISCELLANEOUS) ×2 IMPLANT
ELECTRODE REM PT RTRN 9FT ADLT (ELECTROSURGICAL) ×2 IMPLANT
GAUZE 4X4 16PLY RFD (DISPOSABLE) IMPLANT
GAUZE SPONGE 4X4 12PLY STRL (GAUZE/BANDAGES/DRESSINGS) ×3 IMPLANT
GAUZE XEROFORM 1X8 LF (GAUZE/BANDAGES/DRESSINGS) ×3 IMPLANT
GLOVE BIO SURGEON STRL SZ7 (GLOVE) ×3 IMPLANT
GLOVE BIO SURGEON STRL SZ7.5 (GLOVE) ×3 IMPLANT
GLOVE BIOGEL PI IND STRL 7.0 (GLOVE) ×4 IMPLANT
GLOVE BIOGEL PI IND STRL 8 (GLOVE) ×2 IMPLANT
GLOVE BIOGEL PI INDICATOR 7.0 (GLOVE) ×2
GLOVE BIOGEL PI INDICATOR 8 (GLOVE) ×1
GOWN STRL REUS W/ TWL LRG LVL3 (GOWN DISPOSABLE) ×4 IMPLANT
GOWN STRL REUS W/TWL LRG LVL3 (GOWN DISPOSABLE) ×2
KIT STR SPEAR 1.8 FBRTK DISP (KITS) IMPLANT
LASSO CRESCENT QUICKPASS (SUTURE) IMPLANT
MANIFOLD NEPTUNE II (INSTRUMENTS) ×3 IMPLANT
NEEDLE SCORPION MULTI FIRE (NEEDLE) IMPLANT
NS IRRIG 1000ML POUR BTL (IV SOLUTION) IMPLANT
PACK ARTHROSCOPY DSU (CUSTOM PROCEDURE TRAY) ×3 IMPLANT
PACK BASIN DAY SURGERY FS (CUSTOM PROCEDURE TRAY) ×3 IMPLANT
PENCIL SMOKE EVACUATOR (MISCELLANEOUS) IMPLANT
PROBE BIPOLAR ATHRO 135MM 90D (MISCELLANEOUS) ×3 IMPLANT
RESTRAINT HEAD UNIVERSAL NS (MISCELLANEOUS) ×3 IMPLANT
SLEEVE SCD COMPRESS KNEE MED (MISCELLANEOUS) ×3 IMPLANT
SLING ARM FOAM STRAP LRG (SOFTGOODS) IMPLANT
SLING ARM FOAM STRAP MED (SOFTGOODS) ×3 IMPLANT
SPONGE LAP 4X18 RFD (DISPOSABLE) IMPLANT
STRIP CLOSURE SKIN 1/2X4 (GAUZE/BANDAGES/DRESSINGS) IMPLANT
SUCTION FRAZIER HANDLE 10FR (MISCELLANEOUS)
SUCTION TUBE FRAZIER 10FR DISP (MISCELLANEOUS) IMPLANT
SUPPORT WRAP ARM LG (MISCELLANEOUS) ×3 IMPLANT
SUT ETHILON 3 0 PS 1 (SUTURE) ×3 IMPLANT
SUT ETHILON 4 0 PS 2 18 (SUTURE) IMPLANT
SUT FIBERWIRE #2 38 T-5 BLUE (SUTURE)
SUT MNCRL AB 3-0 PS2 18 (SUTURE) IMPLANT
SUT MNCRL AB 4-0 PS2 18 (SUTURE) IMPLANT
SUT PDS AB 0 CT 36 (SUTURE) IMPLANT
SUT PDS AB 1 CT  36 (SUTURE)
SUT PDS AB 1 CT 36 (SUTURE) IMPLANT
SUT TIGER TAPE 7 IN WHITE (SUTURE) IMPLANT
SUT VIC AB 0 CT1 27 (SUTURE)
SUT VIC AB 0 CT1 27XBRD ANBCTR (SUTURE) IMPLANT
SUT VIC AB 3-0 SH 27 (SUTURE)
SUT VIC AB 3-0 SH 27X BRD (SUTURE) IMPLANT
SUTURE FIBERWR #2 38 T-5 BLUE (SUTURE) IMPLANT
SUTURE TAPE 1.3 40 TPR END (SUTURE) IMPLANT
SUTURE TAPE 1.3 FIBERLOP 20 ST (SUTURE) IMPLANT
SUTURETAPE 1.3 40 TPR END (SUTURE)
SUTURETAPE 1.3 FIBERLOOP 20 ST (SUTURE)
TAPE FIBER 2MM 7IN #2 BLUE (SUTURE) IMPLANT
TOWEL GREEN STERILE FF (TOWEL DISPOSABLE) ×3 IMPLANT
TUBE CONNECTING 20X1/4 (TUBING) ×3 IMPLANT
TUBING ARTHROSCOPY IRRIG 16FT (MISCELLANEOUS) ×3 IMPLANT
WATER STERILE IRR 1000ML POUR (IV SOLUTION) ×3 IMPLANT

## 2020-03-06 NOTE — Transfer of Care (Signed)
Immediate Anesthesia Transfer of Care Note  Patient: Kelsey Vaughan  Procedure(s) Performed: shoulder arthroscopy with subacromial decompression and distal clavicle excision (Right Shoulder)  Patient Location: PACU  Anesthesia Type:General  Level of Consciousness: awake, alert  and oriented  Airway & Oxygen Therapy: Patient Spontanous Breathing and Patient connected to face mask oxygen  Post-op Assessment: Report given to RN and Post -op Vital signs reviewed and stable  Post vital signs: Reviewed and stable  Last Vitals:  Vitals Value Taken Time  BP 124/70 03/06/20 1330  Temp 36.6 C 03/06/20 1327  Pulse 105 03/06/20 1336  Resp 22 03/06/20 1336  SpO2 99 % 03/06/20 1336  Vitals shown include unvalidated device data.  Last Pain:  Vitals:   03/06/20 1327  TempSrc:   PainSc: 0-No pain         Complications: No apparent anesthesia complications

## 2020-03-06 NOTE — Anesthesia Preprocedure Evaluation (Signed)
Anesthesia Evaluation  Patient identified by MRN, date of birth, ID band Patient awake    Reviewed: Allergy & Precautions, H&P , NPO status , Patient's Chart, lab work & pertinent test results  History of Anesthesia Complications (+) PONV and history of anesthetic complications  Airway Mallampati: II   Neck ROM: full    Dental   Pulmonary Current Smoker,    breath sounds clear to auscultation       Cardiovascular  Rhythm:regular Rate:Normal  H/o AF in 2017.  Event monitor (2018) showing NSR with PVCs   Neuro/Psych  Headaches, PSYCHIATRIC DISORDERS Anxiety Depression  Neuromuscular disease    GI/Hepatic GERD  ,  Endo/Other  obese  Renal/GU      Musculoskeletal  (+) Arthritis ,   Abdominal   Peds  Hematology   Anesthesia Other Findings   Reproductive/Obstetrics                             Anesthesia Physical Anesthesia Plan  ASA: II  Anesthesia Plan: General   Post-op Pain Management:  Regional for Post-op pain   Induction: Intravenous  PONV Risk Score and Plan: 3 and Ondansetron, Dexamethasone, Midazolam and Treatment may vary due to age or medical condition  Airway Management Planned: Oral ETT  Additional Equipment:   Intra-op Plan:   Post-operative Plan: Extubation in OR  Informed Consent: I have reviewed the patients History and Physical, chart, labs and discussed the procedure including the risks, benefits and alternatives for the proposed anesthesia with the patient or authorized representative who has indicated his/her understanding and acceptance.       Plan Discussed with: CRNA, Anesthesiologist and Surgeon  Anesthesia Plan Comments:         Anesthesia Quick Evaluation

## 2020-03-06 NOTE — Anesthesia Procedure Notes (Signed)
Anesthesia Regional Block: Interscalene brachial plexus block   Pre-Anesthetic Checklist: ,, timeout performed, Correct Patient, Correct Site, Correct Laterality, Correct Procedure, Correct Position, site marked, Risks and benefits discussed,  Surgical consent,  Pre-op evaluation,  At surgeon's request and post-op pain management  Laterality: Right  Prep: chloraprep       Needles:  Injection technique: Single-shot  Needle Type: Echogenic Stimulator Needle     Needle Length: 5cm  Needle Gauge: 22     Additional Needles:   Procedures:, nerve stimulator,,,,,,,   Nerve Stimulator or Paresthesia:  Response: biceps flexion, 0.45 mA,   Additional Responses:   Narrative:  Start time: 03/06/2020 11:01 AM End time: 03/06/2020 11:12 AM Injection made incrementally with aspirations every 5 mL.  Performed by: Personally  Anesthesiologist: Albertha Ghee, MD  Additional Notes: Functioning IV was confirmed and monitors were applied.  A 65mm 22ga Arrow echogenic stimulator needle was used. Sterile prep and drape,hand hygiene and sterile gloves were used.  Negative aspiration and negative test dose prior to incremental administration of local anesthetic. The patient tolerated the procedure well.  Ultrasound guidance: relevent anatomy identified, needle position confirmed, local anesthetic spread visualized around nerve(s), vascular puncture avoided.  Image printed for medical record.

## 2020-03-06 NOTE — Discharge Instructions (Signed)
Discharge Instructions after Arthroscopic Shoulder Surgery   A sling has been provided for you. You may remove the sling after 72 hours. The sling may be worn for your protection, if you are in a crowd.  Use ice on the shoulder intermittently over the first 48 hours after surgery.  Pain medication has been prescribed for you.  Use your medication liberally over the first 48 hours, and then begin to taper your use. You may take Extra Strength Tylenol or Tylenol only in place of the pain pills. DO NOT take ANY nonsteroidal anti-inflammatory pain medications: Advil, Motrin, Ibuprofen, Aleve, Naproxen, or Naprosyn.  You may remove your dressing after two days.  You may shower 5 days after surgery. The incision CANNOT get wet prior to 5 days. Simply allow the water to wash over the site and then pat dry. Do not rub the incision. Make sure your axilla (armpit) is completely dry after showering.  Take one aspirin a day for 2 weeks after surgery, unless you have an aspirin sensitivity/allergy or asthma.  Three to 5 times each day you should perform assisted overhead reaching and external rotation (outward turning) exercises with the operative arm. Both exercises should be done with the non-operative arm used as the "therapist arm" while the operative arm remains relaxed. Ten of each exercise should be done three to five times each day.    Overhead reach is helping to lift your stiff arm up as high as it will go. To stretch your overhead reach, lie flat on your back, relax, and grasp the wrist of the tight shoulder with your opposite hand. Using the power in your opposite arm, bring the stiff arm up as far as it is comfortable. Start holding it for ten seconds and then work up to where you can hold it for a count of 30. Breathe slowly and deeply while the arm is moved. Repeat this stretch ten times, trying to help the arm up a little higher each time.       External rotation is turning the arm out to  the side while your elbow stays close to your body. External rotation is best stretched while you are lying on your back. Hold a cane, yardstick, broom handle, or dowel in both hands. Bend both elbows to a right angle. Use steady, gentle force from your normal arm to rotate the hand of the stiff shoulder out away from your body. Continue the rotation as far as it will go comfortably, holding it there for a count of 10. Repeat this exercise ten times.     Please call [phone] during normal business hours or [phone] after hours for any problems. Including the following:  - excessive redness of the incisions - drainage for more than 4 days - fever of more than 101.5 F  *Please note that pain medications will not be refilled after hours or on weekends.    Post Anesthesia Home Care Instructions  Activity: Get plenty of rest for the remainder of the day. A responsible individual must stay with you for 24 hours following the procedure.  For the next 24 hours, DO NOT: -Drive a car -Operate machinery -Drink alcoholic beverages -Take any medication unless instructed by your physician -Make any legal decisions or sign important papers.  Meals: Start with liquid foods such as gelatin or soup. Progress to regular foods as tolerated. Avoid greasy, spicy, heavy foods. If nausea and/or vomiting occur, drink only clear liquids until the nausea and/or vomiting subsides. Call   physician if vomiting continues.  Special Instructions/Symptoms: Your throat may feel dry or sore from the anesthesia or the breathing tube placed in your throat during surgery. If this causes discomfort, gargle with warm salt water. The discomfort should disappear within 24 hours.  If you had a scopolamine patch placed behind your ear for the management of post- operative nausea and/or vomiting:  1. The medication in the patch is effective for 72 hours, after which it should be removed.  Wrap patch in a tissue and  discard in the trash. Wash hands thoroughly with soap and water. 2. You may remove the patch earlier than 72 hours if you experience unpleasant side effects which may include dry mouth, dizziness or visual disturbances. 3. Avoid touching the patch. Wash your hands with soap and water after contact with the patch.    Regional Anesthesia Blocks  1. Numbness or the inability to move the "blocked" extremity may last from 3-48 hours after placement. The length of time depends on the medication injected and your individual response to the medication. If the numbness is not going away after 48 hours, call your surgeon.  2. The extremity that is blocked will need to be protected until the numbness is gone and the  Strength has returned. Because you cannot feel it, you will need to take extra care to avoid injury. Because it may be weak, you may have difficulty moving it or using it. You may not know what position it is in without looking at it while the block is in effect.  3. For blocks in the legs and feet, returning to weight bearing and walking needs to be done carefully. You will need to wait until the numbness is entirely gone and the strength has returned. You should be able to move your leg and foot normally before you try and bear weight or walk. You will need someone to be with you when you first try to ensure you do not fall and possibly risk injury.  4. Bruising and tenderness at the needle site are common side effects and will resolve in a few days.  5. Persistent numbness or new problems with movement should be communicated to the surgeon or the [facility] ([phone])/ [facility] ([phone]).Information for Discharge Teaching: EXPAREL (bupivacaine liposome injectable suspension)   Your surgeon or anesthesiologist gave you EXPAREL(bupivacaine) to help control your pain after surgery.   EXPAREL is a local anesthetic that provides pain relief by numbing the  tissue around the surgical site.  EXPAREL is designed to release pain medication over time and can control pain for up to 72 hours.  Depending on how you respond to EXPAREL, you may require less pain medication during your recovery.  Possible side effects:  Temporary loss of sensation or ability to move in the area where bupivacaine was injected.  Nausea, vomiting, constipation  Rarely, numbness and tingling in your mouth or lips, lightheadedness, or anxiety may occur.  Call your doctor right away if you think you may be experiencing any of these sensations, or if you have other questions regarding possible side effects.  Follow all other discharge instructions given to you by your surgeon or nurse. Eat a healthy diet and drink plenty of water or other fluids.  If you return to the hospital for any reason within 96 hours following the administration of EXPAREL, it is important for health care providers to know that you have received this anesthetic. A teal colored band has   been placed on your arm with the date, time and amount of EXPAREL you have received in order to alert and inform your health care providers. Please leave this armband in place for the full 96 hours following administration, and then you may remove the band.    

## 2020-03-06 NOTE — H&P (Signed)
Kelsey Vaughan is an 44 y.o. female.   Chief Complaint: R shoulder pain  HPI: s/p injury at work with continued R shoulder pain, failed conservative treatment.  Past Medical History:  Diagnosis Date  . Anxiety   . Atrial fibrillation ([place])    a. occuring in 02/2016  . B12 deficiency   . Complication of anesthesia   . Constipation   . Foreign body in foot, left 07/2013  . GERD (gastroesophageal reflux disease)   . Hematuria   . Irregular menses    due to oral contraceptive  . Palpitations    a. 01/2017: Event monitor showing SR with PVC's.   [name] Kitchen PONV (postoperative nausea and vomiting)   . Sinus tachycardia   . Thyroid nodule   . Vitamin D deficiency     Past Surgical History:  Procedure Laterality Date  . CESAREAN SECTION    . FOREIGN BODY REMOVAL Left 08/05/2013   Procedure: LEFT FOOT FOREIGN BODY REMOVAL ADULT;  Surgeon: Wylene Simmer, MD;  Location: [facility];  Service: Orthopedics;  Laterality: Left;  . SHOULDER ARTHROSCOPY Left   . TUBAL LIGATION      Family History  Problem Relation Age of Onset  . Diabetes Mother   . Hypertension Mother   . Kidney disease Mother   . CAD Father   . Heart attack Father        died from MI at the age of 44  . CAD Sister        stents placed at age 57  . Breast cancer Neg Hx    Social History:  reports that she has been smoking cigarettes. She has a 1.50 pack-year smoking history. She has never used smokeless tobacco. She reports current alcohol use. She reports that she does not use drugs.  Allergies:  Allergies  Allergen Reactions  . Influenza Vaccines Hives and Rash  . Vyvanse [Lisdexamfetamine Dimesylate]     Headache/mood changes.   . Wellbutrin [Bupropion] Other (See Comments)    Possible nipple soreness/headaches/not feeling right    Medications Prior to Admission  Medication Sig Dispense Refill  . aspirin EC 81 MG tablet Take 1 tablet (81 mg total) by mouth daily.    Ozzie Hoyle 0.15-30 MG-MCG tablet  TAKE 1 TABLET BY MOUTH DAILY. 84 tablet 0  . SUMAtriptan (IMITREX) 100 MG tablet Take 1 tablet (100 mg total) by mouth once as needed for up to 1 dose for migraine. May repeat in 2 hours if headache persists or recurs. 10 tablet 1  . Ubrogepant (UBRELVY) 100 MG TABS Take 1 tablet by mouth as needed. For migraine rescue. 9 tablet 5  . AMBULATORY NON FORMULARY MEDICATION salonpass patches with lidocaine may change every 8 hours as needed for pain. 90 patch 0  . dimenhyDRINATE (DRAMAMINE) 50 MG tablet Take 1 tablet (50 mg total) by mouth every 8 (eight) hours as needed. 30 tablet 0  . Erenumab-aooe (AIMOVIG) 140 MG/ML SOAJ Inject 140 mg into the skin every 30 (thirty) days. 1 pen 5  . levocetirizine (XYZAL) 5 MG tablet Take 1 tablet (5 mg total) by mouth every evening. 90 tablet 1  . nebivolol (BYSTOLIC) 2.5 MG tablet Take 1 tablet (2.5 mg total) by mouth daily as needed (palpatations). 90 tablet 3  . valACYclovir (VALTREX) 1000 MG tablet Take 1 tablet (1,000 mg total) by mouth 2 (two) times daily. 14 tablet 11    Results for orders placed or performed during the hospital encounter of 03/06/20 (from  the past 48 hour(s))  Pregnancy, urine POC     Status: None   Collection Time: 03/06/20 10:16 AM  Result Value Ref Range   Preg Test, Ur NEGATIVE NEGATIVE    Comment:        THE SENSITIVITY OF THIS METHODOLOGY IS >24 mIU/mL    No results found.  Review of Systems  Blood pressure 113/71, pulse 94, temperature 98 F (36.7 C), temperature source Tympanic, resp. rate 15, height 5\' 2"  (1.575 m), weight 84.3 kg, SpO2 100 %, not currently breastfeeding. Physical Exam   Assessment/Plan s/p injury at work with continued R shoulder pain, failed conservative treatment. Plan R arth debridement vs repair RCT with SAD, DCE Risks / benefits of surgery discussed Consent on chart  NPO for OR Preop antibiotics   Isabella Stalling, MD 03/06/2020, 12:10 PM

## 2020-03-06 NOTE — Op Note (Addendum)
Procedure(s):  Maebrie Borjas female 44 y.o. 03/06/2020  Preoperative diagnosis: #1 right shoulder partial-thickness rotator cuff tear #2 right shoulder impingement with unfavorable acromial anatomy #3 right shoulder AC joint arthropathy  Postoperative diagnosis: Same  Procedure(s) and Anesthesia Type: Right shoulder arthroscopic debridement partial-thickness rotator cuff tear Right shoulder arthroscopic subacromial decompression Right shoulder arthroscopic distal clavicle excision  Surgeon(s) and Role:    Tania Ade, MD - Primary     Surgeon: Isabella Stalling   Assistants: Jeanmarie Hubert PA-C [facility] [address] was present and scrubbed throughout the procedure and was essential in positioning, assisting with the camera and instrumentation,, and closure)  Anesthesia: General endotracheal anesthesia with preoperative interscalene block given by attending anesthesiologist     Procedure Detail    Estimated Blood Loss: Min         Drains: none  Blood Given: none         Specimens: none        Complications:  * No complications entered in OR log *         Disposition: PACU - hemodynamically stable.         Condition: stable    Procedure:   INDICATIONS FOR SURGERY: The patient is 44 y.o. female who was injured at work.  She has had right shoulder pain that has been treated conservatively but she continues to have pain and dysfunction.  MRI revealed partial-thickness rotator cuff tearing and she also had significant discomfort at the [facility] joint with degenerative changes and likely internal derangement.  Indicated for surgical treatment to decrease pain and restore function.  OPERATIVE FINDINGS: Examination under anesthesia: No stiffness or instability   DESCRIPTION OF PROCEDURE: The patient was identified in preoperative  holding area where I personally marked the operative site after  verifying site, side, and procedure with the patient. An interscalene block  was given by the attending anesthesiologist the holding area.  The patient was taken back to the operating room where general anesthesia was induced without complication and was placed in the beach-chair position with the back  elevated about 60 degrees and all extremities and head and neck carefully padded and  positioned.   The right upper extremity was then prepped and  draped in a standard sterile fashion. The appropriate time-out  procedure was carried out. The patient did receive IV antibiotics  within 30 minutes of incision.   A small posterior portal incision was made and the arthroscope was introduced into the joint. An anterior portal was then established above the subscapularis using needle localization. Small cannula was placed anteriorly. Diagnostic arthroscopy was then carried out.  She was noted to have fairly extensive articular sided partial-thickness tearing of the subscapularis involving about 20% of the internal aspect of the tendon.  This was extensively debrided back to healthy bleeding tendon.  No formal repair was felt to be necessary.  The superior labrum and the labrum circumferentially were intact without tearing.  The biceps tendon looked healthy.  Glenohumeral joint surfaces were intact without any chondromalacia.  The undersurface of the supraspinatus also had fairly significant partial tearing involving about 30% of the articular layer.  This was debrided back to healthy bleeding tendon and formal repair was not felt to be necessary.  The arthroscope was then introduced into the subacromial space a standard lateral portal was established with needle localization. The shaver was used through the lateral portal to perform extensive bursectomy. Coracoacromial ligament was examined and found to be frayed indicating chronic impingement.  The bursal surface of the rotator cuff was carefully examined and noted to be completely intact with no thinned areas by palpation.  The  coracoacromial ligament was taken down off the anterior acromion with the ArthroCare exposing a large hooked anterior acromial spur. A high-speed bur was then used through the lateral portal to take down the anterior acromial spur from lateral to medial in a standard acromioplasty.  The acromioplasty was also viewed from the lateral portal and the bur was used as necessary to ensure that the acromion was completely flat from posterior to anterior.  The distal clavicle was exposed arthroscopically and the bur was used to take off the undersurface for approximately 8 mm from the lateral portal. The bur was then moved to an anterior portal position to complete the distal clavicle excision resecting about 8 mm of the distal clavicle and a smooth even fashion. This was viewed from anterior and lateral portals and felt to be complete.  The arthroscopic equipment was removed from the joint and the portals were closed with 3-0 nylon in an interrupted fashion. Sterile dressings were then applied including Xeroform 4 x 4's ABDs and tape. The patient was then allowed to awaken from general anesthesia, placed in a sling, transferred to the stretcher and taken to the recovery room in stable condition.   POSTOPERATIVE PLAN: The patient will be discharged home today and will followup in one week for suture removal and wound check.  We will get her back into therapy at her first postop visit.

## 2020-03-06 NOTE — Anesthesia Procedure Notes (Signed)
Procedure Name: Intubation Date/Time: 03/06/2020 12:23 PM Performed by: Alain Marion, CRNA Pre-anesthesia Checklist: Patient identified, Emergency Drugs available, Suction available and Patient being monitored Patient Re-evaluated:Patient Re-evaluated prior to induction Oxygen Delivery Method: Circle System Utilized Preoxygenation: Pre-oxygenation with 100% oxygen Induction Type: IV induction and Rapid sequence Laryngoscope Size: Miller and 2 Grade View: Grade I Tube type: Oral Tube size: 7.0 mm Number of attempts: 1 Airway Equipment and Method: Stylet and Oral airway Placement Confirmation: ETT inserted through vocal cords under direct vision,  positive ETCO2 and breath sounds checked- equal and bilateral Secured at: 21 cm Tube secured with: Tape Dental Injury: Teeth and Oropharynx as per pre-operative assessment

## 2020-03-06 NOTE — Anesthesia Postprocedure Evaluation (Signed)
Anesthesia Post Note  Patient: Kelsey Vaughan  Procedure(s) Performed: shoulder arthroscopy with subacromial decompression and distal clavicle excision (Right Shoulder)     Patient location during evaluation: PACU Anesthesia Type: General and Regional Level of consciousness: awake and alert Pain management: pain level controlled Vital Signs Assessment: post-procedure vital signs reviewed and stable Respiratory status: spontaneous breathing, nonlabored ventilation, respiratory function stable and patient connected to nasal cannula oxygen Cardiovascular status: blood pressure returned to baseline and stable Postop Assessment: no apparent nausea or vomiting Anesthetic complications: no    Last Vitals:  Vitals:   03/06/20 1445 03/06/20 1515  BP: 111/73 120/78  Pulse: (!) 103 (!) 104  Resp: 10 16  Temp:  36.6 C  SpO2: 98% 100%    Last Pain:  Vitals:   03/06/20 1515  TempSrc:   PainSc: 0-No pain                 Iliyana Convey S

## 2020-03-13 ENCOUNTER — Telehealth: Payer: Self-pay | Admitting: Neurology

## 2020-03-13 NOTE — Telephone Encounter (Signed)
Received Bystolic 5 mg [id number].   Patient made aware and at the front for pick up.   [place].

## 2020-03-15 ENCOUNTER — Other Ambulatory Visit: Payer: Self-pay | Admitting: Physician Assistant

## 2020-03-15 DIAGNOSIS — Z3009 Encounter for other general counseling and advice on contraception: Secondary | ICD-10-CM

## 2020-03-15 MED FILL — JULEBER 0.15-30 MG-MCG TABS: 0.15-30 | 28 days supply | Qty: 28 | Fill #0

## 2020-05-02 MED FILL — JULEBER 0.15-30 MG-MCG TABS: 0.15-30 | 28 days supply | Qty: 28 | Fill #1

## 2020-05-02 NOTE — Progress Notes (Deleted)
HPI: FUpalpitations and PAF. Pt had episode of atrial fibrillation 3/17. Event monitor 2/18 showed sinus with PVCs. Bystolic increased 123XX123 because of palpitations associated with PVCs. ABIs February 2020 normal.  Calcium score January 2021 0. Echocardiogram March 2021 showed normal LV function, mild left ventricular hypertrophy, grade 1 diastolic dysfunction.  Monitor March 2021 showed sinus rhythm with PACs, PVCs and 7 beats nonsustained ventricular tachycardia.  March 2021 normal.  Since last seen,  Current Outpatient Medications  Medication Sig Dispense Refill  . AMBULATORY NON FORMULARY MEDICATION salonpass patches with lidocaine may change every 8 hours as needed for pain. 90 patch 0  . aspirin EC 81 MG tablet Take 1 tablet (81 mg total) by mouth daily.    [name] Kitchen dimenhyDRINATE (DRAMAMINE) 50 MG tablet Take 1 tablet (50 mg total) by mouth every 8 (eight) hours as needed. 30 tablet 0  . Erenumab-aooe (AIMOVIG) 140 MG/ML SOAJ Inject 140 mg into the skin every 30 (thirty) days. 1 pen 5  . HYDROcodone-acetaminophen (NORCO) 5-325 MG tablet Take 1-2 tablets by mouth every 6 (six) hours as needed for moderate pain. 20 tablet 0  . JULEBER 0.15-30 MG-MCG tablet TAKE 1 TABLET BY MOUTH DAILY. 84 tablet 0  . levocetirizine (XYZAL) 5 MG tablet Take 1 tablet (5 mg total) by mouth every evening. 90 tablet 1  . nebivolol (BYSTOLIC) 2.5 MG tablet Take 1 tablet (2.5 mg total) by mouth daily as needed (palpatations). 90 tablet 3  . promethazine (PHENERGAN) 12.5 MG tablet Take 1 tablet (12.5 mg total) by mouth every 6 (six) hours as needed for nausea or vomiting. 15 tablet 0  . SUMAtriptan (IMITREX) 100 MG tablet Take 1 tablet (100 mg total) by mouth once as needed for up to 1 dose for migraine. May repeat in 2 hours if headache persists or recurs. 10 tablet 1  . tiZANidine (ZANAFLEX) 4 MG tablet Take 1 tablet (4 mg total) by mouth every 8 (eight) hours as needed for muscle spasms. 30 tablet 1  . Ubrogepant  (UBRELVY) 100 MG TABS Take 1 tablet by mouth as needed. For migraine rescue. 9 tablet 5  . valACYclovir (VALTREX) 1000 MG tablet Take 1 tablet (1,000 mg total) by mouth 2 (two) times daily. 14 tablet 11   No current facility-administered medications for this visit.     Past Medical History:  Diagnosis Date  . Anxiety   . Atrial fibrillation ([place])    a. occuring in 02/2016  . B12 deficiency   . Complication of anesthesia   . Constipation   . Foreign body in foot, left 07/2013  . GERD (gastroesophageal reflux disease)   . Hematuria   . Irregular menses    due to oral contraceptive  . Palpitations    a. 01/2017: Event monitor showing SR with PVC's.   [name] Kitchen PONV (postoperative nausea and vomiting)   . Sinus tachycardia   . Thyroid nodule   . Vitamin D deficiency     Past Surgical History:  Procedure Laterality Date  . CESAREAN SECTION    . FOREIGN BODY REMOVAL Left 08/05/2013   Procedure: LEFT FOOT FOREIGN BODY REMOVAL ADULT;  Surgeon: Wylene Simmer, MD;  Location: [facility];  Service: Orthopedics;  Laterality: Left;  . SHOULDER ARTHROSCOPY Left   . TUBAL LIGATION      Social History   Socioeconomic History  . Marital status: Divorced    Spouse name: Not on file  . Number of children: 3  .  Years of education: Not on file  . Highest education level: Not on file  Occupational History  . Occupation: Physiological scientist    Employer: [employer]  Tobacco Use  . Smoking status: Current Every Day Smoker    Packs/day: 0.25    Years: 6.00    Pack years: 1.50    Types: Cigarettes  . Smokeless tobacco: Never Used  . Tobacco comment: 4-5 cigs/day  Substance and Sexual Activity  . Alcohol use: Yes    Comment: occasionally  . Drug use: No  . Sexual activity: Not on file    Comment: tubal ligation  Other Topics Concern  . Not on file  Social History Narrative  . Not on file   Social Determinants of Health   Financial Resource Strain:   . Difficulty  of Paying Living Expenses:   Food Insecurity:   . Worried About Charity fundraiser in the Last Year:   . Arboriculturist in the Last Year:   Transportation Needs:   . Film/video editor (Medical):   [name] Kitchen Lack of Transportation (Non-Medical):   Physical Activity:   . Days of Exercise per Week:   . Minutes of Exercise per Session:   Stress:   . Feeling of Stress :   Social Connections:   . Frequency of Communication with Friends and Family:   . Frequency of Social Gatherings with Friends and Family:   . Attends Religious Services:   . Active Member of Clubs or Organizations:   . Attends Archivist Meetings:   [name] Kitchen Marital Status:   Intimate Partner Violence:   . Fear of Current or Ex-Partner:   . Emotionally Abused:   [name] Kitchen Physically Abused:   . Sexually Abused:     Family History  Problem Relation Age of Onset  . Diabetes Mother   . Hypertension Mother   . Kidney disease Mother   . CAD Father   . Heart attack Father        died from MI at the age of 60  . CAD Sister        stents placed at age 33  . Breast cancer Neg Hx     ROS: no fevers or chills, productive cough, hemoptysis, dysphasia, odynophagia, melena, hematochezia, dysuria, hematuria, rash, seizure activity, orthopnea, PND, pedal edema, claudication. Remaining systems are negative.  Physical Exam: Well-developed well-nourished in no acute distress.  Skin is warm and dry.  HEENT is normal.  Neck is supple.  Chest is clear to auscultation with normal expansion.  Cardiovascular exam is regular rate and rhythm.  Abdominal exam nontender or distended. No masses palpated. Extremities show no edema. neuro grossly intact  ECG- personally reviewed  A/P  1 paroxysmal atrial fibrillation-patient is in sinus rhythm today.  Continue beta-blocker at present dose.  If she has more frequent episodes in the future we will consider antiarrhythmic versus referral for ablation. CHADSvasc 1 for female sex; continue  ASA.  2 palpitations-plan to continue beta-blocker at present dose.  3 tobacco abuse-patient counseled on discontinuing.  4 dyspnea-recent calcium score 0.  LV function normal.  Kirk Ruths, MD

## 2020-05-09 ENCOUNTER — Ambulatory Visit: Payer: BLUE CROSS/BLUE SHIELD | Admitting: Cardiology

## 2020-06-14 ENCOUNTER — Encounter: Payer: Self-pay | Admitting: Physician Assistant

## 2020-06-14 ENCOUNTER — Other Ambulatory Visit: Payer: Self-pay

## 2020-06-14 ENCOUNTER — Ambulatory Visit (INDEPENDENT_AMBULATORY_CARE_PROVIDER_SITE_OTHER): Payer: BLUE CROSS/BLUE SHIELD | Admitting: Physician Assistant

## 2020-06-14 VITALS — BP 120/74 | HR 86 | Ht 62.0 in | Wt 188.0 lb

## 2020-06-14 DIAGNOSIS — R002 Palpitations: Secondary | ICD-10-CM

## 2020-06-14 DIAGNOSIS — I479 Paroxysmal tachycardia, unspecified: Secondary | ICD-10-CM | POA: Diagnosis not present

## 2020-06-14 DIAGNOSIS — I493 Ventricular premature depolarization: Secondary | ICD-10-CM

## 2020-06-14 DIAGNOSIS — I480 Paroxysmal atrial fibrillation: Secondary | ICD-10-CM

## 2020-06-14 DIAGNOSIS — I491 Atrial premature depolarization: Secondary | ICD-10-CM

## 2020-06-14 MED ORDER — NEBIVOLOL HCL 5 MG PO TABS: 5.0000 mg | ORAL_TABLET | Freq: Two times a day (BID) | ORAL | 3 refills | Status: DC

## 2020-06-14 NOTE — Telephone Encounter (Signed)
Call pt and discussed sx and scheduled an appt for 06/22/2020 2:15 PM Coletta Memos, NP pt will arrive alone and early wearing a mask

## 2020-06-14 NOTE — Patient Instructions (Addendum)
Medication Instructions:   INCREASE Bystolic to 5 mg 2 times a day  You make take over the counter Magnesium 400 mg daily  *If you need a refill on your cardiac medications before your next appointment, please call your pharmacy*  Lab Work: Your physician recommends that you return for lab work TODAY:   BMET  CBC  Magnesium  TSH If you have labs (blood work) drawn today and your tests are completely normal, you will receive your results only by: [name] Kitchen MyChart Message (if you have MyChart) OR . A paper copy in the mail If you have any lab test that is abnormal or we need to change your treatment, we will call you to review the results.  Testing/Procedures: NONE ordered at this time of appointment   Follow-Up: At [facility], you and your health needs are our priority.  As part of our continuing mission to provide you with exceptional heart care, we have created designated Provider Care Teams.  These Care Teams include your primary Cardiologist (physician) and Advanced Practice Providers (APPs -  Physician Assistants and Nurse Practitioners) who all work together to provide you with the care you need, when you need it.  We recommend signing up for the patient portal called "MyChart".  Sign up information is provided on this After Visit Summary.  MyChart is used to connect with patients for Virtual Visits (Telemedicine).  Patients are able to view lab/test results, encounter notes, upcoming appointments, etc.  Non-urgent messages can be sent to your provider as well.   To learn more about what you can do with MyChart, go to [email or web address].    Your next appointment:   1 month(s)  The format for your next appointment:   In Person  Provider:   You may see Kirk Ruths, MD or one of the following Advanced Practice Providers on your designated Care Team:    Kerin Ransom, PA-C  [place], [place]  Coletta Memos, [place]  Other Instructions

## 2020-06-14 NOTE — Progress Notes (Signed)
Cardiology Office Note:    Date:  06/14/2020   ID:  [name], DOB 11/12/1976, MRN [id number]  PCP:  Donella Stade, PA-C  Cardiologist:  Kirk Ruths, MD   Referring MD: Donella Stade, PA-C   Chief Complaint  Patient presents with  . Follow-up  . Headache  . Palpitations    History of Present Illness:    Kelsey Vaughan is a 44 y.o. female with a hx of paroxysmal atrial fibrillation on 81 mg aspirin for CHA2DS2-VASc score of 1, thyroid nodule, GERD, and anxiety.  She will fibrillation first diagnosed in March 2017.  Event monitor in 2018 showed sinus rhythm with PVCs.  Bystolic was increased due to palpitations associated with PVCs.  History of not taking her Bystolic and self discontinuing aspirin.  Calcium score obtained given her concern for family history of CAD.  Coronary calcium score was 0.  She was seen in the ER at [facility] earlier this year for palpitations with chest tightness. Symptoms felt consistent to panic attack. She ruled out with negative CE's. She had not been taking bystolic for the past 6 months due to cost. She was discharged from ED without admission. This was her second ER visit for palpitations. She was last seen in clinic with Sande Rives PAC fr preop clearance for shoulder surgery. She reported episodes of dizziness and headache she felt was not driven by anxiety. Repeat echo performed and zio patch placed.   Zio patch with sinus rhythm, PVCs and NSVT x 1. Echo with normal EF and mild DD. She was cleared for surgery.  She presents today for increased PVCs and palpitations. She states she feels palpitations when lying down at night, resolved with repeated coughing. She continues to have migraines not controlled by her migraine medications. She continues to have rare episodes of dizziness, no syncope. This morning, she became dyspneic with climbing stairs at her house with a HR 128. Palpitations are interfering with her sleep - worried about falling  asleep thinking she will stop breathing. She has increased her bystolic to 5 mg on her own. She usually takes this at night, but took it this morning for increased palpitations. We discussed that she can try to take this 5 mg BID to see if this helps palpitations.    Past Medical History:  Diagnosis Date  . Anxiety   . Atrial fibrillation ([place])    a. occuring in 02/2016  . B12 deficiency   . Complication of anesthesia   . Constipation   . Foreign body in foot, left 07/2013  . GERD (gastroesophageal reflux disease)   . Hematuria   . Irregular menses    due to oral contraceptive  . Palpitations    a. 01/2017: Event monitor showing SR with PVC's.   [name] Kitchen PONV (postoperative nausea and vomiting)   . Sinus tachycardia   . Thyroid nodule   . Vitamin D deficiency     Past Surgical History:  Procedure Laterality Date  . CESAREAN SECTION    . FOREIGN BODY REMOVAL Left 08/05/2013   Procedure: LEFT FOOT FOREIGN BODY REMOVAL ADULT;  Surgeon: Wylene Simmer, MD;  Location: [place];  Service: Orthopedics;  Laterality: Left;  . SHOULDER ARTHROSCOPY Left   . TUBAL LIGATION      Current Medications: Current Meds  Medication Sig  . aspirin EC 81 MG tablet Take 1 tablet (81 mg total) by mouth daily.  [name] Kitchen dimenhyDRINATE (DRAMAMINE) 50 MG tablet Take 1 tablet (50 mg total)  by mouth every 8 (eight) hours as needed.  Eduard Roux (AIMOVIG) 140 MG/ML SOAJ Inject 140 mg into the skin every 30 (thirty) days.  Ozzie Hoyle 0.15-30 MG-MCG tablet TAKE 1 TABLET BY MOUTH DAILY.  [name] Kitchen levocetirizine (XYZAL) 5 MG tablet Take 1 tablet (5 mg total) by mouth every evening.  . nebivolol (BYSTOLIC) 5 MG tablet Take 1 tablet (5 mg total) by mouth in the morning and at bedtime.  . promethazine (PHENERGAN) 12.5 MG tablet Take 1 tablet (12.5 mg total) by mouth every 6 (six) hours as needed for nausea or vomiting.  . SUMAtriptan (IMITREX) 100 MG tablet Take 1 tablet (100 mg total) by mouth once as needed for up  to 1 dose for migraine. May repeat in 2 hours if headache persists or recurs.  . Ubrogepant (UBRELVY) 100 MG TABS Take 1 tablet by mouth as needed. For migraine rescue.  . valACYclovir (VALTREX) 1000 MG tablet Take 1 tablet (1,000 mg total) by mouth 2 (two) times daily.  . [DISCONTINUED] nebivolol (BYSTOLIC) 2.5 MG tablet Take 1 tablet (2.5 mg total) by mouth daily as needed (palpatations).     Allergies:   Influenza vaccines, Vyvanse [lisdexamfetamine dimesylate], and Wellbutrin [bupropion]   Social History   Socioeconomic History  . Marital status: Divorced    Spouse name: Not on file  . Number of children: 3  . Years of education: Not on file  . Highest education level: Not on file  Occupational History  . Occupation: Physiological scientist    Employer: [employer]  Tobacco Use  . Smoking status: Current Every Day Smoker    Packs/day: 0.25    Years: 6.00    Pack years: 1.50    Types: Cigarettes  . Smokeless tobacco: Never Used  . Tobacco comment: 4-5 cigs/day  Vaping Use  . Vaping Use: Never used  Substance and Sexual Activity  . Alcohol use: Yes    Comment: occasionally  . Drug use: No  . Sexual activity: Not on file    Comment: tubal ligation  Other Topics Concern  . Not on file  Social History Narrative  . Not on file   Social Determinants of Health   Financial Resource Strain:   . Difficulty of Paying Living Expenses:   Food Insecurity:   . Worried About Charity fundraiser in the Last Year:   . Arboriculturist in the Last Year:   Transportation Needs:   . Film/video editor (Medical):   [name] Kitchen Lack of Transportation (Non-Medical):   Physical Activity:   . Days of Exercise per Week:   . Minutes of Exercise per Session:   Stress:   . Feeling of Stress :   Social Connections:   . Frequency of Communication with Friends and Family:   . Frequency of Social Gatherings with Friends and Family:   . Attends Religious Services:   . Active Member of Clubs  or Organizations:   . Attends Archivist Meetings:   [name] Kitchen Marital Status:      Family History: The patient's family history includes CAD in her father and sister; Diabetes in her mother; Heart attack in her father; Hypertension in her mother; Kidney disease in her mother. There is no history of Breast cancer.  ROS:   Please see the history of present illness.     All other systems reviewed and are negative.  EKGs/Labs/Other Studies Reviewed:    The following studies were reviewed today:  Zio patch 02/2020  with sinus rhythm, PVCs and NSVT x 1  Echo 02/28/20: 1. Left ventricular ejection fraction, by estimation, is 55 to 60%. The  left ventricle has normal function. The left ventricle has no regional  wall motion abnormalities. There is mild left ventricular hypertrophy.  Left ventricular diastolic parameters  are consistent with Grade I diastolic dysfunction (impaired relaxation).  2. Right ventricular systolic function is normal. The right ventricular  size is normal.  3. The mitral valve is normal in structure. Trivial mitral valve  regurgitation.  4. The aortic valve is normal in structure. Aortic valve regurgitation is  not visualized.  5. The inferior vena cava is normal in size with greater than 50%  respiratory variability, suggesting right atrial pressure of 3 mmHg.   Cardiac Event Monitor 01/27/2017 [facility]): Cardiac event recorder reveals predominantly mild sinus tachycardia with fairly frequent but isolated PVCs. No other cardiac ectopy or cardiac arrhythmias were noted. Symptoms of palpitations tend to correlate with the isolated PVCs.  EKG:  EKG is ordered today.  The ekg ordered today demonstrates sinus rhythm HR 86  Recent Labs: 02/11/2020: ALT 17; BUN 12; Creat 0.80; Hemoglobin 13.0; Platelets 336; Potassium 4.0; Sodium 140  Recent Lipid Panel    Component Value Date/Time   CHOL 182 09/02/2018 1435   CHOL 172 06/30/2017 0919   TRIG 240 (H)  09/02/2018 1435   HDL 52 09/02/2018 1435   HDL 58 06/30/2017 0919   CHOLHDL 3.5 09/02/2018 1435   LDLCALC 94 09/02/2018 1435    Physical Exam:    VS:  BP 120/74 (BP Location: Left Arm, Patient Position: Sitting, Cuff Size: Normal)   Pulse 86   Ht 5\' 2"  (1.575 m)   Wt 188 lb (85.3 kg)   SpO2 96%   BMI 34.39 kg/m     Wt Readings from Last 3 Encounters:  06/14/20 188 lb (85.3 kg)  03/06/20 185 lb 13.6 oz (84.3 kg)  02/16/20 187 lb (84.8 kg)     GEN:  Well nourished, well developed in no acute distress HEENT: Normal NECK: No JVD; No carotid bruits LYMPHATICS: No lymphadenopathy CARDIAC: RRR, no murmurs, rubs, gallops RESPIRATORY:  Clear to auscultation without rales, wheezing or rhonchi  ABDOMEN: Soft, non-tender, non-distended MUSCULOSKELETAL:  No edema; No deformity  SKIN: Warm and dry NEUROLOGIC:  Alert and oriented x 3 PSYCHIATRIC:  Normal affect   ASSESSMENT:    1. Paroxysmal atrial fibrillation (HCC)   2. Palpitations   3. PAC (premature atrial contraction)   4. Tachycardia, paroxysmal ([place])   5. PVC (premature ventricular contraction)    PLAN:    In order of problems listed above:  Palpitations PVCs PAF - has gained weight with pandemic - she does not believe anxiety is driving her increased palpitations - will check labs today including thyroid - she will increased her exercise program to attempt gentle weight loss - she has cut back on caffeine - continue 5 mg bystolic BID - if labs ok and no relief with increased BB, may consider starting cardizem - may also try 400 mg magnesium - follow up in 1 month   Follow up in 1 month.    Medication Adjustments/Labs and Tests Ordered: Current medicines are reviewed at length with the patient today.  Concerns regarding medicines are outlined above.  Orders Placed This Encounter  Procedures  . Basic metabolic panel  . CBC  . Magnesium  . TSH  . EKG 12-Lead   Meds ordered this encounter  Medications    .  nebivolol (BYSTOLIC) 5 MG tablet    Sig: Take 1 tablet (5 mg total) by mouth in the morning and at bedtime.    Dispense:  180 tablet    Refill:  3    Signed, Ledora Bottcher, [place]  06/14/2020 4:23 PM     [facility]

## 2020-06-15 LAB — BASIC METABOLIC PANEL
BUN/Creatinine Ratio: 13 (ref 9–23)
BUN: 11 mg/dL (ref 6–24)
CO2: 23 mmol/L (ref 20–29)
Calcium: 9 mg/dL (ref 8.7–10.2)
Chloride: 108 mmol/L — ABNORMAL HIGH (ref 96–106)
Creatinine, Ser: 0.85 mg/dL (ref 0.57–1.00)
GFR calc Af Amer: 97 mL/min/{1.73_m2} (ref >59)
GFR calc non Af Amer: 84 mL/min/{1.73_m2} (ref >59)
Glucose: 94 mg/dL (ref 65–99)
Potassium: 4.2 mmol/L (ref 3.5–5.2)
Sodium: 144 mmol/L (ref 134–144)

## 2020-06-15 LAB — CBC
Hematocrit: 36.9 % (ref 34.0–46.6)
Hemoglobin: 12.3 g/dL (ref 11.1–15.9)
MCH: 29 pg (ref 26.6–33.0)
MCHC: 33.3 g/dL (ref 31.5–35.7)
MCV: 87 fL (ref 79–97)
Platelets: 320 10*3/uL (ref 150–450)
RBC: 4.24 x10E6/uL (ref 3.77–5.28)
RDW: 14.5 % (ref 11.7–15.4)
WBC: 12.9 10*3/uL — ABNORMAL HIGH (ref 3.4–10.8)

## 2020-06-15 LAB — MAGNESIUM: Magnesium: 1.9 mg/dL (ref 1.6–2.3)

## 2020-06-15 LAB — TSH: TSH: 1.24 u[IU]/mL (ref 0.450–4.500)

## 2020-06-20 ENCOUNTER — Ambulatory Visit (INDEPENDENT_AMBULATORY_CARE_PROVIDER_SITE_OTHER): Payer: BLUE CROSS/BLUE SHIELD | Admitting: Physician Assistant

## 2020-06-20 ENCOUNTER — Other Ambulatory Visit: Payer: Self-pay | Admitting: Physician Assistant

## 2020-06-20 ENCOUNTER — Encounter: Payer: Self-pay | Admitting: Physician Assistant

## 2020-06-20 VITALS — BP 129/60 | HR 97 | Ht 62.0 in | Wt 184.0 lb

## 2020-06-20 DIAGNOSIS — G4452 New daily persistent headache (NDPH): Secondary | ICD-10-CM | POA: Diagnosis not present

## 2020-06-20 DIAGNOSIS — J0111 Acute recurrent frontal sinusitis: Secondary | ICD-10-CM

## 2020-06-20 MED ORDER — FLUTICASONE PROPIONATE 50 MCG/ACT NA SUSP: 2.0000 | Freq: Every day | NASAL | 6 refills | Status: DC

## 2020-06-20 MED ORDER — PREDNISONE 50 MG PO TABS: ORAL_TABLET | ORAL | 0 refills | Status: DC

## 2020-06-20 MED ORDER — AMOXICILLIN-POT CLAVULANATE 875-125 MG PO TABS: 1.0000 | ORAL_TABLET | Freq: Two times a day (BID) | ORAL | 0 refills | Status: DC

## 2020-06-20 MED ORDER — SUMATRIPTAN SUCCINATE 100 MG PO TABS: 100.0000 mg | ORAL_TABLET | Freq: Once | ORAL | 5 refills | Status: DC | PRN

## 2020-06-20 MED FILL — SUMATRIPTAN SUCC 100 MG TAB: 100 | 30 days supply | Qty: 8 | Fill #0

## 2020-06-20 MED FILL — predniSONE 50 MG TABS: 50 | 5 days supply | Qty: 5 | Fill #0

## 2020-06-20 MED FILL — AMOXICILLIN-POT CLAVULANATE: 875-125 | 10 days supply | Qty: 20 | Fill #0

## 2020-06-20 MED FILL — DESOGESTREL-ETHINYL ESTRADI: 0.15-30 | 28 days supply | Qty: 28 | Fill #2
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
## 2020-06-22 ENCOUNTER — Ambulatory Visit: Payer: BLUE CROSS/BLUE SHIELD | Admitting: General Practice

## 2020-06-28 ENCOUNTER — Ambulatory Visit (INDEPENDENT_AMBULATORY_CARE_PROVIDER_SITE_OTHER): Payer: BLUE CROSS/BLUE SHIELD

## 2020-06-28 ENCOUNTER — Ambulatory Visit (INDEPENDENT_AMBULATORY_CARE_PROVIDER_SITE_OTHER): Payer: BLUE CROSS/BLUE SHIELD | Admitting: Sports Medicine

## 2020-06-28 ENCOUNTER — Encounter: Payer: Self-pay | Admitting: Sports Medicine

## 2020-06-28 DIAGNOSIS — R200 Anesthesia of skin: Secondary | ICD-10-CM | POA: Diagnosis not present

## 2020-06-28 DIAGNOSIS — M542 Cervicalgia: Secondary | ICD-10-CM | POA: Diagnosis not present

## 2020-06-28 DIAGNOSIS — R202 Paresthesia of skin: Secondary | ICD-10-CM

## 2020-06-28 DIAGNOSIS — M75101 Unspecified rotator cuff tear or rupture of right shoulder, not specified as traumatic: Secondary | ICD-10-CM

## 2020-06-28 DIAGNOSIS — M25511 Pain in right shoulder: Secondary | ICD-10-CM | POA: Diagnosis not present

## 2020-06-28 MED ORDER — GABAPENTIN 300 MG PO CAPS: 300.0000 mg | ORAL_CAPSULE | Freq: Every day | ORAL | 3 refills | Status: DC

## 2020-06-28 MED FILL — GABAPENTIN 300 MG CAPSULE: 300 | 30 days supply | Qty: 30 | Fill #0

## 2020-06-28 NOTE — Assessment & Plan Note (Signed)
Kelsey Vaughan is a 44 year old female, she has had a subacromial decompression and distal clavicular excision on the right with Dr. Tamera Punt back in March. Continues to have pain in her shoulder but does endorse paresthesias going down the hand to the fingertips. On exam she does have some subscapularis findings with pain on internal rotation, she also has a positive O'Brien's test concerning for a labral injury. We are going to proceed with x-rays and an updated MRI. Ultimately she really needs to take this up with her surgeon.

## 2020-06-28 NOTE — Assessment & Plan Note (Signed)
Kelsey Vaughan also has numbness and tingling going down her right arm, I suspect she has a cervical disc disorder, adding x-rays, MRI, home rehab exercises, return to see [place] to go over results. I would also like her to do a low-dose of gabapentin at night as this is keeping her up at night.

## 2020-06-28 NOTE — Progress Notes (Signed)
    Procedures performed today:    None.  Independent interpretation of notes and tests performed by another provider:   None.  Brief History, Exam, Impression, and Recommendations:    Supraspinatus tendon tear Mikhala is a 44 year old female, she has had a subacromial decompression and distal clavicular excision on the right with Dr. Tamera Punt back in March. Continues to have pain in her shoulder but does endorse paresthesias going down the hand to the fingertips. On exam she does have some subscapularis findings with pain on internal rotation, she also has a positive O'Brien's test concerning for a labral injury. We are going to proceed with x-rays and an updated MRI. Ultimately she really needs to take this up with her surgeon.  Numbness and tingling of right arm Roizy also has numbness and tingling going down her right arm, I suspect she has a cervical disc disorder, adding x-rays, MRI, home rehab exercises, return to see [place] to go over results. I would also like her to do a low-dose of gabapentin at night as this is keeping her up at night.    ___________________________________________ Gwen Her. Dianah Field, M.D., ABFM., CAQSM. Primary Care and [place] Instructor of [place] [facility]

## 2020-07-08 NOTE — Progress Notes (Deleted)
HPI: HPI: FUpalpitations and PAF. Pt had episode of atrial fibrillation 3/17. Event monitor 2/18 showed sinus with PVCs. Bystolic increased 6/75 because of palpitations associated with PVCs. ABIs February 2020 normal.  Ca score 1/21 0. Echo 3/21 showed normal LV function; mild LVH; grade 1 DD. Monitor 3/21 showed sinus with PACs, PVCs and 7 beats NSVT. Since last seen,  Current Outpatient Medications  Medication Sig Dispense Refill   amoxicillin-clavulanate (AUGMENTIN) 875-125 MG tablet Take 1 tablet by mouth 2 (two) times daily. 20 tablet 0   aspirin EC 81 MG tablet Take 1 tablet (81 mg total) by mouth daily.     dimenhyDRINATE (DRAMAMINE) 50 MG tablet Take 1 tablet (50 mg total) by mouth every 8 (eight) hours as needed. 30 tablet 0   fluticasone (FLONASE) 50 MCG/ACT nasal spray Place 2 sprays into both nostrils daily. 16 g 6   gabapentin (NEURONTIN) 300 MG capsule Take 1 capsule (300 mg total) by mouth at bedtime. 30 capsule 3   JULEBER 0.15-30 MG-MCG tablet TAKE 1 TABLET BY MOUTH DAILY. 84 tablet 0   levocetirizine (XYZAL) 5 MG tablet Take 1 tablet (5 mg total) by mouth every evening. 90 tablet 1   nebivolol (BYSTOLIC) 5 MG tablet Take 1 tablet (5 mg total) by mouth in the morning and at bedtime. 180 tablet 3   predniSONE (DELTASONE) 50 MG tablet One tab PO daily for 5 days. 5 tablet 0   promethazine (PHENERGAN) 12.5 MG tablet Take 1 tablet (12.5 mg total) by mouth every 6 (six) hours as needed for nausea or vomiting. 15 tablet 0   SUMAtriptan (IMITREX) 100 MG tablet Take 1 tablet (100 mg total) by mouth once as needed for up to 1 dose for migraine. May repeat in 2 hours if headache persists or recurs. 10 tablet 5   Ubrogepant (UBRELVY) 100 MG TABS Take 1 tablet by mouth as needed. For migraine rescue. 9 tablet 5   valACYclovir (VALTREX) 1000 MG tablet Take 1 tablet (1,000 mg total) by mouth 2 (two) times daily. 14 tablet 11   No current facility-administered medications  for this visit.     Past Medical History:  Diagnosis Date   Anxiety    Atrial fibrillation ([place])    a. occuring in 02/2016   B12 deficiency    Complication of anesthesia    Constipation    Foreign body in foot, left 07/2013   GERD (gastroesophageal reflux disease)    Hematuria    Irregular menses    due to oral contraceptive   Palpitations    a. 01/2017: Event monitor showing SR with PVC's.    PONV (postoperative nausea and vomiting)    Sinus tachycardia    Thyroid nodule    Vitamin D deficiency     Past Surgical History:  Procedure Laterality Date   CESAREAN SECTION     FOREIGN BODY REMOVAL Left 08/05/2013   Procedure: LEFT FOOT FOREIGN BODY REMOVAL ADULT;  Surgeon: Wylene Simmer, MD;  Location: [facility];  Service: Orthopedics;  Laterality: Left;   SHOULDER ARTHROSCOPY Left    TUBAL LIGATION      Social History   Socioeconomic History   Marital status: Divorced    Spouse name: Not on file   Number of children: 3   Years of education: Not on file   Highest education level: Not on file  Occupational History   Occupation: certified Psychologist, sport and exercise    Employer: [employer]  Use   Smoking status: Current Every Day Smoker    Packs/day: 0.25    Years: 6.00    Pack years: 1.50    Types: Cigarettes   Smokeless tobacco: Never Used   Tobacco comment: 4-5 cigs/day  Vaping Use   Vaping Use: Never used  Substance and Sexual Activity   Alcohol use: Yes    Comment: occasionally   Drug use: No   Sexual activity: Not on file    Comment: tubal ligation  Other Topics Concern   Not on file  Social History Narrative   Not on file   Social Determinants of Health   Financial Resource Strain:    Difficulty of Paying Living Expenses:   Food Insecurity:    Worried About Charity fundraiser in the Last Year:    Arboriculturist in the Last Year:   Transportation Needs:    Film/video editor (Medical):     Lack of Transportation (Non-Medical):   Physical Activity:    Days of Exercise per Week:    Minutes of Exercise per Session:   Stress:    Feeling of Stress :   Social Connections:    Frequency of Communication with Friends and Family:    Frequency of Social Gatherings with Friends and Family:    Attends Religious Services:    Active Member of Clubs or Organizations:    Attends Music therapist:    Marital Status:   Intimate Partner Violence:    Fear of Current or Ex-Partner:    Emotionally Abused:    Physically Abused:    Sexually Abused:     Family History  Problem Relation Age of Onset   Diabetes Mother    Hypertension Mother    Kidney disease Mother    CAD Father    Heart attack Father        died from MI at the age of 46   CAD Sister        stents placed at age 98   Breast cancer Neg Hx     ROS: no fevers or chills, productive cough, hemoptysis, dysphasia, odynophagia, melena, hematochezia, dysuria, hematuria, rash, seizure activity, orthopnea, PND, pedal edema, claudication. Remaining systems are negative.  Physical Exam: Well-developed well-nourished in no acute distress.  Skin is warm and dry.  HEENT is normal.  Neck is supple.  Chest is clear to auscultation with normal expansion.  Cardiovascular exam is regular rate and rhythm.  Abdominal exam nontender or distended. No masses palpated. Extremities show no edema. neuro grossly intact  ECG- personally reviewed  A/P  1 PAF-pt in sinus on exam today; continue beta blocker. Previous monitor did not show recurrence of atrial fibrillation. CHADSvasc 1 for female sex. Continue ASA. Can consider antiarrhythmic in the future if has more frequent episodes.  2 palpitations-continue beta blocker.  3 tobacco abuse-pt counseled on discontinuing.  Kirk Ruths, MD

## 2020-07-09 ENCOUNTER — Ambulatory Visit (INDEPENDENT_AMBULATORY_CARE_PROVIDER_SITE_OTHER): Payer: BLUE CROSS/BLUE SHIELD

## 2020-07-09 ENCOUNTER — Other Ambulatory Visit: Payer: Self-pay

## 2020-07-09 DIAGNOSIS — R202 Paresthesia of skin: Secondary | ICD-10-CM | POA: Diagnosis not present

## 2020-07-09 DIAGNOSIS — M542 Cervicalgia: Secondary | ICD-10-CM | POA: Diagnosis not present

## 2020-07-09 DIAGNOSIS — M19011 Primary osteoarthritis, right shoulder: Secondary | ICD-10-CM | POA: Diagnosis not present

## 2020-07-09 DIAGNOSIS — M75101 Unspecified rotator cuff tear or rupture of right shoulder, not specified as traumatic: Secondary | ICD-10-CM | POA: Diagnosis not present

## 2020-07-09 DIAGNOSIS — R200 Anesthesia of skin: Secondary | ICD-10-CM

## 2020-07-11 ENCOUNTER — Encounter: Payer: Self-pay | Admitting: *Deleted

## 2020-07-14 ENCOUNTER — Ambulatory Visit: Payer: BLUE CROSS/BLUE SHIELD | Admitting: Cardiology

## 2020-07-14 ENCOUNTER — Ambulatory Visit (INDEPENDENT_AMBULATORY_CARE_PROVIDER_SITE_OTHER): Payer: BLUE CROSS/BLUE SHIELD | Admitting: Sports Medicine

## 2020-07-14 ENCOUNTER — Other Ambulatory Visit: Payer: Self-pay

## 2020-07-14 DIAGNOSIS — M75101 Unspecified rotator cuff tear or rupture of right shoulder, not specified as traumatic: Secondary | ICD-10-CM | POA: Diagnosis not present

## 2020-07-14 DIAGNOSIS — F330 Major depressive disorder, recurrent, mild: Secondary | ICD-10-CM | POA: Diagnosis not present

## 2020-07-14 DIAGNOSIS — Z6830 Body mass index (BMI) 30.0-30.9, adult: Secondary | ICD-10-CM | POA: Diagnosis not present

## 2020-07-14 DIAGNOSIS — E6609 Other obesity due to excess calories: Secondary | ICD-10-CM | POA: Diagnosis not present

## 2020-07-14 DIAGNOSIS — E66811 Obesity, class 1: Secondary | ICD-10-CM

## 2020-07-14 NOTE — Assessment & Plan Note (Deleted)
At this point we have tried phentermine, Saxenda, today we are going to switch to [facility]. She will return after 3 months.

## 2020-07-14 NOTE — Progress Notes (Signed)
° ° °  Procedures performed today:    Procedure: Real-time Ultrasound Guided injection of the right glenohumeral joint Device: Samsung HS60  Verbal informed consent obtained.  Time-out conducted.  Noted no overlying erythema, induration, or other signs of local infection.  Skin prepped in a sterile fashion.  Local anesthesia: Topical Ethyl chloride.  With sterile technique and under real time ultrasound guidance: 1 cc Kenalog 40, 2 cc lidocaine, 2 cc bupivacaine injected easily Completed without difficulty  Pain immediately resolved suggesting accurate placement of the medication.  Advised to call if fevers/chills, erythema, induration, drainage, or persistent bleeding.  Images permanently stored and available for review in the ultrasound unit.  Impression: Technically successful ultrasound guided injection.  Independent interpretation of notes and tests performed by another provider:   None.  Brief History, Exam, Impression, and Recommendations:    Supraspinatus tendon tear This is a 44 year old female, she had a subacromial decompression and distal clavicular excision on the right by Dr. Tamera Punt back in March. She continued to have pain in her shoulder and also some paresthesias going down the hand to the fingertips. On exam she had some subscapularis findings with pain on resisted internal rotation, she also had a positive O'Brien's test concerning for labral injury/glenohumeral pathology. We proceeded with MRI of her cervical spine and her shoulder, cervical spine MRI showed some small disc protrusions in the upper cervical spine that would not explain paresthesias down to the hand, shoulder MRI showed severe supraspinatus tendinosis, evidence of a distal clavicular excision as well as glenohumeral osteoarthritis. Today because she still has glenohumeral findings we did a diagnostic and therapeutic glenohumeral joint injection which provided good relief. I think she no longer would  like to deal with Dr. Tamera Punt and I would agree that she needs a second surgical opinion, so I would like her to have a discussion with Dr. Griffin Basil. Return to see me in a month to evaluate relief from her glenohumeral injection.    ___________________________________________ Gwen Her. Dianah Field, M.D., ABFM., CAQSM. Primary Care and [place] Instructor of [place] of [facility]

## 2020-07-14 NOTE — Assessment & Plan Note (Signed)
This is a 44 year old female, she had a subacromial decompression and distal clavicular excision on the right by Dr. Tamera Punt back in March. She continued to have pain in her shoulder and also some paresthesias going down the hand to the fingertips. On exam she had some subscapularis findings with pain on resisted internal rotation, she also had a positive O'Brien's test concerning for labral injury/glenohumeral pathology. We proceeded with MRI of her cervical spine and her shoulder, cervical spine MRI showed some small disc protrusions in the upper cervical spine that would not explain paresthesias down to the hand, shoulder MRI showed severe supraspinatus tendinosis, evidence of a distal clavicular excision as well as glenohumeral osteoarthritis. Today because she still has glenohumeral findings we did a diagnostic and therapeutic glenohumeral joint injection which provided good relief. I think she no longer would like to deal with Dr. Tamera Punt and I would agree that she needs a second surgical opinion, so I would like her to have a discussion with Dr. Griffin Basil. Return to see me in a month to evaluate relief from her glenohumeral injection.

## 2020-07-16 NOTE — Progress Notes (Deleted)
error 

## 2020-07-17 ENCOUNTER — Telehealth: Payer: Self-pay | Admitting: Physician Assistant

## 2020-07-17 ENCOUNTER — Ambulatory Visit: Payer: BLUE CROSS/BLUE SHIELD | Admitting: General Practice

## 2020-07-17 MED ORDER — LEVOCETIRIZINE DIHYDROCHLORIDE 5 MG PO TABS: 5.0000 mg | ORAL_TABLET | Freq: Every evening | ORAL | 1 refills | Status: AC

## 2020-07-17 MED ORDER — CLARITHROMYCIN 500 MG PO TABS: 500.0000 mg | ORAL_TABLET | Freq: Two times a day (BID) | ORAL | 0 refills | Status: DC

## 2020-07-17 MED ORDER — MOMETASONE FUROATE 50 MCG/ACT NA SUSP: NASAL | 6 refills | Status: AC

## 2020-07-17 MED FILL — LEVOCETIRIZINE 5 MG TABLET: 5 | 30 days supply | Qty: 30 | Fill #0

## 2020-07-17 MED FILL — MOMETASONE FUROATE 50 MCG S: 50 | 30 days supply | Qty: 17 | Fill #0

## 2020-07-17 MED FILL — CLARITHROMYCIN 500 MG TAB: 500 | 30 days supply | Qty: 60 | Fill #0

## 2020-07-17 NOTE — Telephone Encounter (Signed)
Pt calls and continues to have sinusitis symptoms.  We discussed treating for chronic sinusitis.  Sent biaxin/nasonex/xyzal to take for next 4 weeks.

## 2020-07-21 ENCOUNTER — Ambulatory Visit: Payer: BLUE CROSS/BLUE SHIELD | Admitting: Physician Assistant

## 2020-07-21 DIAGNOSIS — M25511 Pain in right shoulder: Secondary | ICD-10-CM | POA: Diagnosis not present

## 2020-08-22 ENCOUNTER — Other Ambulatory Visit: Payer: Self-pay | Admitting: Physician Assistant

## 2020-08-22 DIAGNOSIS — Z3009 Encounter for other general counseling and advice on contraception: Secondary | ICD-10-CM

## 2020-08-22 MED FILL — DESOGESTREL-ETHINYL ESTRADI: 0.15-30 | 84 days supply | Qty: 84 | Fill #0

## 2020-10-04 ENCOUNTER — Ambulatory Visit: Payer: BLUE CROSS/BLUE SHIELD | Admitting: Sports Medicine

## 2020-10-04 ENCOUNTER — Ambulatory Visit (INDEPENDENT_AMBULATORY_CARE_PROVIDER_SITE_OTHER): Payer: BC Managed Care – PPO | Admitting: Sports Medicine

## 2020-10-04 DIAGNOSIS — M25522 Pain in left elbow: Secondary | ICD-10-CM | POA: Insufficient documentation

## 2020-10-04 NOTE — Assessment & Plan Note (Signed)
This is a 44 year old female, she works for [employer]. She has started to have some pain in her left elbow, localized both anteriorly and laterally. She does have tenderness at the distal biceps as well as the common extensor tendon origin, reproduction of pain with gripping, wrist extension but also with resisted supination. I do think she has a bit of a combination of both distal bicep tendinitis and tennis elbow, tennis elbow is the worst symptom today so we will add a tennis elbow brace, she can continue her Duexis. Tennis elbow rehab exercises given, return to see me in a few weeks, tennis elbow injection if no better. If the tennis elbow injection only provides partial relief we will likely have to do a distal biceps injection as well.

## 2020-10-04 NOTE — Progress Notes (Signed)
    Procedures performed today:    None.  Independent interpretation of notes and tests performed by another provider:   None.  Brief History, Exam, Impression, and Recommendations:    Left elbow pain This is a 44 year old female, she works for [employer]. She has started to have some pain in her left elbow, localized both anteriorly and laterally. She does have tenderness at the distal biceps as well as the common extensor tendon origin, reproduction of pain with gripping, wrist extension but also with resisted supination. I do think she has a bit of a combination of both distal bicep tendinitis and tennis elbow, tennis elbow is the worst symptom today so we will add a tennis elbow brace, she can continue her Duexis. Tennis elbow rehab exercises given, return to see me in a few weeks, tennis elbow injection if no better. If the tennis elbow injection only provides partial relief we will likely have to do a distal biceps injection as well.    ___________________________________________ Gwen Her. Dianah Field, M.D., ABFM., CAQSM. Primary Care and [place] Instructor of [place] of [facility] of Medicine

## 2020-10-16 NOTE — Progress Notes (Deleted)
HPI: FUpalpitations and PAF. Pt had episode of atrial fibrillation 3/17. Event monitor 2/18 showed sinus with PVCs. Bystolic increased 8/65 because of palpitations associated with PVCs. ABIs February 2020 normal.  Calcium score January 2021 0.  Echocardiogram repeated March 2021 and showed normal LV function.monitor March 2021 showed sinus rhythm with PACs, PVCs and 7 beats nonsustained ventricular tachycardia.  Since last seen,   Current Outpatient Medications  Medication Sig Dispense Refill  . aspirin EC 81 MG tablet Take 1 tablet (81 mg total) by mouth daily.    [name] Kitchen desogestrel-ethinyl estradiol (APRI) 0.15-30 MG-MCG tablet TAKE 1 TABLET BY MOUTH DAILY. 84 tablet 1  . dimenhyDRINATE (DRAMAMINE) 50 MG tablet Take 1 tablet (50 mg total) by mouth every 8 (eight) hours as needed. 30 tablet 0  . levocetirizine (XYZAL) 5 MG tablet Take 1 tablet (5 mg total) by mouth every evening. 90 tablet 1  . mometasone (NASONEX) 50 MCG/ACT nasal spray One spray in each nostril twice a day, use left hand for right nostril, and right hand for left nostril.  Please dispense one bottle. 1 g 6  . nebivolol (BYSTOLIC) 5 MG tablet Take 1 tablet (5 mg total) by mouth in the morning and at bedtime. 180 tablet 3  . SUMAtriptan (IMITREX) 100 MG tablet Take 1 tablet (100 mg total) by mouth once as needed for up to 1 dose for migraine. May repeat in 2 hours if headache persists or recurs. 10 tablet 5  . Ubrogepant (UBRELVY) 100 MG TABS Take 1 tablet by mouth as needed. For migraine rescue. 9 tablet 5  . valACYclovir (VALTREX) 1000 MG tablet Take 1 tablet (1,000 mg total) by mouth 2 (two) times daily. 14 tablet 11   No current facility-administered medications for this visit.     Past Medical History:  Diagnosis Date  . Anxiety   . Atrial fibrillation ([place])    a. occuring in 02/2016  . B12 deficiency   . Complication of anesthesia   . Constipation   . Foreign body in foot, left 07/2013  . GERD (gastroesophageal  reflux disease)   . Hematuria   . Irregular menses    due to oral contraceptive  . Palpitations    a. 01/2017: Event monitor showing SR with PVC's.   [name] Kitchen PONV (postoperative nausea and vomiting)   . Sinus tachycardia   . Thyroid nodule   . Vitamin D deficiency     Past Surgical History:  Procedure Laterality Date  . CESAREAN SECTION    . FOREIGN BODY REMOVAL Left 08/05/2013   Procedure: LEFT FOOT FOREIGN BODY REMOVAL ADULT;  Surgeon: Wylene Simmer, MD;  Location: [facility];  Service: Orthopedics;  Laterality: Left;  . SHOULDER ARTHROSCOPY Left   . TUBAL LIGATION      Social History   Socioeconomic History  . Marital status: Divorced    Spouse name: Not on file  . Number of children: 3  . Years of education: Not on file  . Highest education level: Not on file  Occupational History  . Occupation: Physiological scientist    Employer: [employer]  Tobacco Use  . Smoking status: Current Every Day Smoker    Packs/day: 0.25    Years: 6.00    Pack years: 1.50    Types: Cigarettes  . Smokeless tobacco: Never Used  . Tobacco comment: 4-5 cigs/day  Vaping Use  . Vaping Use: Never used  Substance and Sexual Activity  . Alcohol use:  Yes    Comment: occasionally  . Drug use: No  . Sexual activity: Not Currently    Birth control/protection: Surgical    Comment: tubal ligation  Other Topics Concern  . Not on file  Social History Narrative  . Not on file   Social Determinants of Health   Financial Resource Strain:   . Difficulty of Paying Living Expenses: Not on file  Food Insecurity:   . Worried About Charity fundraiser in the Last Year: Not on file  . Ran Out of Food in the Last Year: Not on file  Transportation Needs:   . Lack of Transportation (Medical): Not on file  . Lack of Transportation (Non-Medical): Not on file  Physical Activity:   . Days of Exercise per Week: Not on file  . Minutes of Exercise per Session: Not on file  Stress:   .  Feeling of Stress : Not on file  Social Connections:   . Frequency of Communication with Friends and Family: Not on file  . Frequency of Social Gatherings with Friends and Family: Not on file  . Attends Religious Services: Not on file  . Active Member of Clubs or Organizations: Not on file  . Attends Archivist Meetings: Not on file  . Marital Status: Not on file  Intimate Partner Violence:   . Fear of Current or Ex-Partner: Not on file  . Emotionally Abused: Not on file  . Physically Abused: Not on file  . Sexually Abused: Not on file    Family History  Problem Relation Age of Onset  . Diabetes Mother   . Hypertension Mother   . Kidney disease Mother   . CAD Father   . Heart attack Father        died from MI at the age of 26  . CAD Sister        stents placed at age 21  . Breast cancer Neg Hx     ROS: no fevers or chills, productive cough, hemoptysis, dysphasia, odynophagia, melena, hematochezia, dysuria, hematuria, rash, seizure activity, orthopnea, PND, pedal edema, claudication. Remaining systems are negative.  Physical Exam: Well-developed well-nourished in no acute distress.  Skin is warm and dry.  HEENT is normal.  Neck is supple.  Chest is clear to auscultation with normal expansion.  Cardiovascular exam is regular rate and rhythm.  Abdominal exam nontender or distended. No masses palpated. Extremities show no edema. neuro grossly intact  ECG- personally reviewed  A/P  1 paroxysmal atrial fibrillation-patient is in sinus rhythm today.  Continue beta-blocker at present dose.CHADSvasc 1 for female sex.  We have therefore not anticoagulated.  We will consider the addition of an antiarrhythmic in the future with more frequent episodes.  2 palpitations-continue beta-blocker.  3 history of dyspnea-also with family history of coronary disease.  Previous calcium score 0.  4 tobacco abuse-patient counseled on discontinuing.  Kirk Ruths, MD

## 2020-10-23 ENCOUNTER — Ambulatory Visit: Payer: BLUE CROSS/BLUE SHIELD | Admitting: Cardiology

## 2020-10-23 DIAGNOSIS — X58XXXA Exposure to other specified factors, initial encounter: Secondary | ICD-10-CM | POA: Diagnosis not present

## 2020-10-23 DIAGNOSIS — R21 Rash and other nonspecific skin eruption: Secondary | ICD-10-CM | POA: Diagnosis not present

## 2020-10-23 DIAGNOSIS — T7840XA Allergy, unspecified, initial encounter: Secondary | ICD-10-CM | POA: Diagnosis not present

## 2020-10-23 DIAGNOSIS — Y929 Unspecified place or not applicable: Secondary | ICD-10-CM | POA: Diagnosis not present

## 2020-10-23 DIAGNOSIS — L299 Pruritus, unspecified: Secondary | ICD-10-CM | POA: Diagnosis not present

## 2020-10-23 DIAGNOSIS — F17200 Nicotine dependence, unspecified, uncomplicated: Secondary | ICD-10-CM | POA: Diagnosis not present

## 2020-10-25 ENCOUNTER — Ambulatory Visit: Payer: BC Managed Care – PPO | Admitting: Sports Medicine

## 2020-10-25 ENCOUNTER — Other Ambulatory Visit: Payer: Self-pay | Admitting: Physician Assistant

## 2020-10-25 DIAGNOSIS — L509 Urticaria, unspecified: Secondary | ICD-10-CM

## 2020-10-25 NOTE — Progress Notes (Signed)
Pt has had 2 urticaria reactions after eating and drinking pinapple juice or products. She had to go to ED for prednisone and benadryl. No trouble breathing. Needs allergy testing.

## 2020-11-20 ENCOUNTER — Telehealth: Payer: Self-pay | Admitting: Physician Assistant

## 2020-11-20 DIAGNOSIS — Z13220 Encounter for screening for lipoid disorders: Secondary | ICD-10-CM

## 2020-11-20 DIAGNOSIS — N039 Chronic nephritic syndrome with unspecified morphologic changes: Secondary | ICD-10-CM

## 2020-11-20 DIAGNOSIS — Z131 Encounter for screening for diabetes mellitus: Secondary | ICD-10-CM

## 2020-11-20 DIAGNOSIS — R5383 Other fatigue: Secondary | ICD-10-CM

## 2020-11-20 DIAGNOSIS — Z1329 Encounter for screening for other suspected endocrine disorder: Secondary | ICD-10-CM

## 2020-11-24 NOTE — Telephone Encounter (Signed)
2 labs still pending.   LDL up from 2 years ago.  TG better.  Overall cholesterol is pretty good based on your risk factors.   Kidney and liver look great.  Slightly elevated fasting glucose. Go ahead and order A1C.  Hemoglobin looks great.

## 2020-11-27 NOTE — Telephone Encounter (Signed)
A1C has increased over the last 2 years to 5.7. you are in the pre-diabetes range.

## 2020-11-29 LAB — CBC
HCT: 37.6 % (ref 35.0–45.0)
Hemoglobin: 12.8 g/dL (ref 11.7–15.5)
MCH: 28.9 pg (ref 27.0–33.0)
MCHC: 34 g/dL (ref 32.0–36.0)
MCV: 84.9 fL (ref 80.0–100.0)
MPV: 11.7 fL (ref 7.5–12.5)
Platelets: 342 10*3/uL (ref 140–400)
RBC: 4.43 10*6/uL (ref 3.80–5.10)
RDW: 13.8 % (ref 11.0–15.0)
WBC: 9.7 10*3/uL (ref 3.8–10.8)

## 2020-11-29 LAB — COMPLETE METABOLIC PANEL WITH GFR
AG Ratio: 1.3 (calc) (ref 1.0–2.5)
ALT: 21 U/L (ref 6–29)
AST: 15 U/L (ref 10–30)
Albumin: 3.9 g/dL (ref 3.6–5.1)
Alkaline phosphatase (APISO): 83 U/L (ref 31–125)
BUN: 10 mg/dL (ref 7–25)
CO2: 27 mmol/L (ref 20–32)
Calcium: 9.1 mg/dL (ref 8.6–10.2)
Chloride: 106 mmol/L (ref 98–110)
Creat: 0.79 mg/dL (ref 0.50–1.10)
GFR, Est African American: 106 mL/min/{1.73_m2} (ref >=60)
GFR, Est Non African American: 91 mL/min/{1.73_m2} (ref >=60)
Globulin: 3 g/dL (calc) (ref 1.9–3.7)
Glucose, Bld: 101 mg/dL — ABNORMAL HIGH (ref 65–99)
Potassium: 3.5 mmol/L (ref 3.5–5.3)
Sodium: 139 mmol/L (ref 135–146)
Total Bilirubin: 0.3 mg/dL (ref 0.2–1.2)
Total Protein: 6.9 g/dL (ref 6.1–8.1)

## 2020-11-29 LAB — LIPID PANEL W/REFLEX DIRECT LDL
Cholesterol: 188 mg/dL (ref <200)
HDL: 45 mg/dL — ABNORMAL LOW (ref >=50)
LDL Cholesterol (Calc): 118 mg/dL (calc) — ABNORMAL HIGH
Non-HDL Cholesterol (Calc): 143 mg/dL (calc) — ABNORMAL HIGH (ref <130)
Total CHOL/HDL Ratio: 4.2 (calc) (ref <5.0)
Triglycerides: 134 mg/dL (ref <150)

## 2020-11-29 LAB — TSH: TSH: 1.32 mIU/L

## 2020-11-29 LAB — HEMOGLOBIN A1C W/OUT EAG: Hgb A1c MFr Bld: 5.7 % of total Hgb — ABNORMAL HIGH (ref <5.7)

## 2020-11-29 LAB — FERRITIN: Ferritin: 45 ng/mL (ref 16–232)

## 2020-11-29 NOTE — Telephone Encounter (Signed)
Kelsey Vaughan,   Iron stores fair at 23.  Thyroid normal.

## 2020-12-13 ENCOUNTER — Ambulatory Visit: Payer: Self-pay | Admitting: Allergy

## 2020-12-13 ENCOUNTER — Other Ambulatory Visit: Payer: Self-pay | Admitting: Physician Assistant

## 2020-12-13 DIAGNOSIS — Z1231 Encounter for screening mammogram for malignant neoplasm of breast: Secondary | ICD-10-CM

## 2020-12-14 ENCOUNTER — Other Ambulatory Visit: Payer: Self-pay

## 2020-12-14 ENCOUNTER — Ambulatory Visit
Admission: RE | Admit: 2020-12-14 | Discharge: 2020-12-14 | Disposition: A | Discharge status: Home / Self Care | Payer: Medicaid Other | Source: Ambulatory Visit | Attending: Physician Assistant | Admitting: Physician Assistant

## 2020-12-14 DIAGNOSIS — Z1231 Encounter for screening mammogram for malignant neoplasm of breast: Secondary | ICD-10-CM

## 2020-12-14 NOTE — Progress Notes (Signed)
Normal mammogram. Follow up in 1 year.

## 2020-12-20 ENCOUNTER — Ambulatory Visit (INDEPENDENT_AMBULATORY_CARE_PROVIDER_SITE_OTHER): Payer: Medicaid Other | Admitting: Physician Assistant

## 2020-12-20 VITALS — Temp 100.1°F

## 2020-12-20 DIAGNOSIS — R059 Cough, unspecified: Secondary | ICD-10-CM

## 2020-12-20 DIAGNOSIS — J069 Acute upper respiratory infection, unspecified: Secondary | ICD-10-CM
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
## 2020-12-21 ENCOUNTER — Encounter: Payer: Self-pay | Admitting: Physician Assistant

## 2020-12-22 ENCOUNTER — Encounter: Payer: Self-pay | Admitting: Physician Assistant

## 2020-12-22 LAB — SARS-COV-2, NAA 2 DAY TAT

## 2020-12-22 LAB — SPECIMEN STATUS REPORT

## 2020-12-22 LAB — NOVEL CORONAVIRUS, NAA: SARS-CoV-2, NAA: NOT DETECTED

## 2020-12-22 NOTE — Telephone Encounter (Signed)
fyi

## 2020-12-22 NOTE — Progress Notes (Signed)
Negative for covid. 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 Continue symptomatic care if at the one week mark not significantly better consider adding antibiotic.

## 2020-12-22 NOTE — Telephone Encounter (Signed)
Can we write her up a note in emr, thanks to go back tomorrow.

## 2020-12-24 NOTE — Telephone Encounter (Signed)
Note written

## 2020-12-27 ENCOUNTER — Ambulatory Visit (INDEPENDENT_AMBULATORY_CARE_PROVIDER_SITE_OTHER): Payer: Medicaid Other | Admitting: Certified Nurse Midwife

## 2020-12-27 ENCOUNTER — Encounter: Payer: Self-pay | Admitting: Certified Nurse Midwife

## 2020-12-27 ENCOUNTER — Other Ambulatory Visit (HOSPITAL_COMMUNITY)
Admission: RE | Admit: 2020-12-27 | Discharge: 2020-12-27 | Disposition: A | Discharge status: Home / Self Care | Payer: Medicaid Other | Source: Ambulatory Visit | Attending: Certified Nurse Midwife | Admitting: Certified Nurse Midwife

## 2020-12-27 ENCOUNTER — Other Ambulatory Visit: Payer: Self-pay

## 2020-12-27 VITALS — BP 115/76 | HR 100 | Resp 16 | Ht 62.0 in | Wt 185.0 lb

## 2020-12-27 DIAGNOSIS — N760 Acute vaginitis: Secondary | ICD-10-CM

## 2020-12-27 DIAGNOSIS — Z01419 Encounter for gynecological examination (general) (routine) without abnormal findings: Secondary | ICD-10-CM

## 2020-12-27 DIAGNOSIS — Z01411 Encounter for gynecological examination (general) (routine) with abnormal findings: Secondary | ICD-10-CM

## 2020-12-27 DIAGNOSIS — Z113 Encounter for screening for infections with a predominantly sexual mode of transmission: Secondary | ICD-10-CM

## 2020-12-27 DIAGNOSIS — B9689 Other specified bacterial agents as the cause of diseases classified elsewhere: Secondary | ICD-10-CM | POA: Diagnosis not present

## 2020-12-27 NOTE — Progress Notes (Signed)
GYNECOLOGY CLINIC ANNUAL PREVENTATIVE CARE ENCOUNTER NOTE  Subjective:   Kelsey Vaughan is a 45 y.o. G57P3003 female here for a routine annual gynecologic exam.  Current complaints: None. She has had one new sexual partner with unprotected sex this year and requests STI screening, and her previous heavy bleeding is well managed with her oral contraceptive.   Denies abnormal vaginal bleeding, discharge, pelvic pain, problems with intercourse or other gynecologic concerns.    Gynecologic History Patient's last menstrual period was 12/25/2020. Contraception: OCP (estrogen/progesterone) Last Pap: 09/15/19. Results were: normal Last mammogram: 12/14/20. Results were: normal  Obstetric History OB History  Gravida Para Term Preterm AB Living  3 3 3     3   SAB IAB Ectopic Multiple Live Births               # Outcome Date GA Lbr Len/2nd Weight Sex Delivery Anes PTL Lv  3 Term           2 Term           1 Term             Past Medical History:  Diagnosis Date  . Anxiety   . Atrial fibrillation ([place])    a. occuring in 02/2016  . B12 deficiency   . Complication of anesthesia   . Constipation   . Foreign body in foot, left 07/2013  . GERD (gastroesophageal reflux disease)   . Hematuria   . Irregular menses    due to oral contraceptive  . Palpitations    a. 01/2017: Event monitor showing SR with PVC's.   [name] Kitchen PONV (postoperative nausea and vomiting)   . Sinus tachycardia   . Thyroid nodule   . Vitamin D deficiency     Past Surgical History:  Procedure Laterality Date  . CESAREAN SECTION    . FOREIGN BODY REMOVAL Left 08/05/2013   Procedure: LEFT FOOT FOREIGN BODY REMOVAL ADULT;  Surgeon: Wylene Simmer, MD;  Location: [facility];  Service: Orthopedics;  Laterality: Left;  . SHOULDER ARTHROSCOPY Left   . TUBAL LIGATION      Current Outpatient Medications on File Prior to Visit  Medication Sig Dispense Refill  . aspirin EC 81 MG tablet Take 1 tablet (81 mg total)  by mouth daily.    [name] Kitchen desogestrel-ethinyl estradiol (APRI) 0.15-30 MG-MCG tablet TAKE 1 TABLET BY MOUTH DAILY. 84 tablet 1  . dimenhyDRINATE (DRAMAMINE) 50 MG tablet Take 1 tablet (50 mg total) by mouth every 8 (eight) hours as needed. 30 tablet 0  . levocetirizine (XYZAL) 5 MG tablet Take 1 tablet (5 mg total) by mouth every evening. 90 tablet 1  . mometasone (NASONEX) 50 MCG/ACT nasal spray One spray in each nostril twice a day, use left hand for right nostril, and right hand for left nostril.  Please dispense one bottle. 1 g 6  . nebivolol (BYSTOLIC) 5 MG tablet Take 1 tablet (5 mg total) by mouth in the morning and at bedtime. 180 tablet 3  . SUMAtriptan (IMITREX) 100 MG tablet Take 1 tablet (100 mg total) by mouth once as needed for up to 1 dose for migraine. May repeat in 2 hours if headache persists or recurs. 10 tablet 5  . Ubrogepant (UBRELVY) 100 MG TABS Take 1 tablet by mouth as needed. For migraine rescue. 9 tablet 5  . valACYclovir (VALTREX) 1000 MG tablet Take 1 tablet (1,000 mg total) by mouth 2 (two) times daily. 14 tablet 11  No current facility-administered medications on file prior to visit.    Allergies  Allergen Reactions  . Influenza Vaccines Hives and Rash  . Vyvanse [Lisdexamfetamine Dimesylate]     Headache/mood changes.   . Wellbutrin [Bupropion] Other (See Comments)    Possible nipple soreness/headaches/not feeling right  . Hydrocodone-Acetaminophen Rash    Social History   Socioeconomic History  . Marital status: Divorced    Spouse name: Not on file  . Number of children: 3  . Years of education: Not on file  . Highest education level: Not on file  Occupational History  . Occupation: Physiological scientist    Employer: [employer]  Tobacco Use  . Smoking status: Current Every Day Smoker    Packs/day: 0.25    Years: 6.00    Pack years: 1.50    Types: Cigarettes  . Smokeless tobacco: Never Used  . Tobacco comment: 4-5 cigs/day  Vaping Use  .  Vaping Use: Never used  Substance and Sexual Activity  . Alcohol use: Yes    Comment: occasionally  . Drug use: No  . Sexual activity: Not Currently    Birth control/protection: Surgical, Pill    Comment: tubal ligation  Other Topics Concern  . Not on file  Social History Narrative  . Not on file   Social Determinants of Health   Financial Resource Strain: Not on file  Food Insecurity: Not on file  Transportation Needs: Not on file  Physical Activity: Not on file  Stress: Not on file  Social Connections: Not on file  Intimate Partner Violence: Not on file    Family History  Problem Relation Age of Onset  . Diabetes Mother   . Hypertension Mother   . Kidney disease Mother   . CAD Father   . Heart attack Father        died from MI at the age of 13  . CAD Sister        stents placed at age 68  . Breast cancer Neg Hx     The following portions of the patient's history were reviewed and updated as appropriate: allergies, current medications, past family history, past medical history, past social history, past surgical history and problem list.  Review of Systems Pertinent items noted in HPI and remainder of comprehensive ROS otherwise negative.   Objective:  BP 115/76   Pulse 100   Resp 16   Ht 5\' 2"  (1.575 m)   Wt 185 lb (83.9 kg)   LMP 12/25/2020   BMI 33.84 kg/m  CONSTITUTIONAL: Well-developed, well-nourished female in no acute distress.  HENT:  Normocephalic, atraumatic, External right and left ear normal. Oropharynx is clear and moist EYES: Conjunctivae and EOM are normal. Pupils are equal, round, and reactive to light. No scleral icterus.  NECK: Normal range of motion, supple, no masses.  Normal thyroid.  SKIN: Skin is warm and dry. No rash noted. Not diaphoretic. No erythema. No pallor. Alpha: Alert and oriented to person, place, and time. Normal reflexes, muscle tone coordination. No cranial nerve deficit noted. PSYCHIATRIC: Normal mood and affect.  Normal behavior. Normal judgment and thought content. CARDIOVASCULAR: Normal heart rate noted, regular rhythm RESPIRATORY: Clear to auscultation bilaterally. Effort and breath sounds normal, no problems with respiration noted. BREASTS: Symmetric in size. No masses, skin changes, nipple drainage, or lymphadenopathy. ABDOMEN: Soft, normal bowel sounds, no distention noted.  No tenderness, rebound or guarding.  PELVIC: Normal appearing external genitalia; normal appearing vaginal mucosa and cervix.  No  abnormal discharge noted. Normal uterine size, no other palpable masses, no uterine or adnexal tenderness. MUSCULOSKELETAL: Normal range of motion. No tenderness.  No cyanosis, clubbing, or edema.  2+ distal pulses.  Assessment:  Annual gynecologic examination - pap smear not indicated, mammogram performed within last 67mo.   Plan:  Will follow up results of STI testing and manage accordingly. Routine preventative health maintenance measures emphasized. Please refer to After Visit Summary for other counseling recommendations.   Gabriel Carina, CNM

## 2020-12-27 NOTE — Patient Instructions (Signed)
Prediabetes Eating Plan Prediabetes is a condition that causes blood sugar (glucose) levels to be higher than normal. This increases the risk for developing type 2 diabetes (type 2 diabetes mellitus). Working with a health care provider or nutrition specialist (dietitian) to make diet and lifestyle changes can help prevent the onset of diabetes. These changes may help you:  Control your blood glucose levels.  Improve your cholesterol levels.  Manage your blood pressure. What are tips for following this plan? Reading food labels  Read food labels to check the amount of fat, salt (sodium), and sugar in prepackaged foods. Avoid foods that have: ? Saturated fats. ? Trans fats. ? Added sugars.  Avoid foods that have more than 300 milligrams (mg) of sodium per serving. Limit your sodium intake to less than 2,300 mg each day. Shopping  Avoid buying pre-made and processed foods.  Avoid buying drinks with added sugar. Cooking  Cook with olive oil. Do not use butter, lard, or ghee.  Bake, broil, grill, steam, or boil foods. Avoid frying. Meal planning  Work with your dietitian to create an eating plan that is right for you. This may include tracking how many calories you take in each day. Use a food diary, notebook, or mobile application to track what you eat at each meal.  Consider following a Mediterranean diet. This includes: ? Eating several servings of fresh fruits and vegetables each day. ? Eating fish at least twice a week. ? Eating one serving each day of whole grains, beans, nuts, and seeds. ? Using olive oil instead of other fats. ? Limiting alcohol. ? Limiting red meat. ? Using nonfat or low-fat dairy products.  Consider following a plant-based diet. This includes dietary choices that focus on eating mostly vegetables and fruit, grains, beans, nuts, and seeds.  If you have high blood pressure, you may need to limit your sodium intake or follow a diet such as the DASH  (Dietary Approaches to Stop Hypertension) eating plan. The DASH diet aims to lower high blood pressure.   Lifestyle  Set weight loss goals with help from your health care team. It is recommended that most people with prediabetes lose 7% of their body weight.  Exercise for at least 30 minutes 5 or more days a week.  Attend a support group or seek support from a mental health counselor.  Take over-the-counter and prescription medicines only as told by your health care provider. What foods are recommended? Fruits Berries. Bananas. Apples. Oranges. Grapes. Papaya. Mango. Pomegranate. Kiwi. Grapefruit. Cherries. Vegetables Lettuce. Spinach. Peas. Beets. Cauliflower. Cabbage. Broccoli. Carrots. Tomatoes. Squash. Eggplant. Herbs. Peppers. Onions. Cucumbers. Brussels sprouts. Grains Whole grains, such as whole-wheat or whole-grain breads, crackers, cereals, and pasta. Unsweetened oatmeal. Bulgur. Barley. Quinoa. Brown rice. Corn or whole-wheat flour tortillas or taco shells. Meats and other proteins Seafood. Poultry without skin. Lean cuts of pork and beef. Tofu. Eggs. Nuts. Beans. Dairy Low-fat or fat-free dairy products, such as yogurt, cottage cheese, and cheese. Beverages Water. Tea. Coffee. Sugar-free or diet soda. Seltzer water. Low-fat or nonfat milk. Milk alternatives, such as soy or almond milk. Fats and oils Olive oil. Canola oil. Sunflower oil. Grapeseed oil. Avocado. Walnuts. Sweets and desserts Sugar-free or low-fat pudding. Sugar-free or low-fat ice cream and other frozen treats. Seasonings and condiments Herbs. Sodium-free spices. Mustard. Relish. Low-salt, low-sugar ketchup. Low-salt, low-sugar barbecue sauce. Low-fat or fat-free mayonnaise. The items listed above may not be a complete list of recommended foods and beverages. Contact a dietitian for more   information. What foods are not recommended? Fruits Fruits canned with syrup. Vegetables Canned vegetables. Frozen  vegetables with butter or cream sauce. Grains Refined white flour and flour products, such as bread, pasta, snack foods, and cereals. Meats and other proteins Fatty cuts of meat. Poultry with skin. Breaded or fried meat. Processed meats. Dairy Full-fat yogurt, cheese, or milk. Beverages Sweetened drinks, such as iced tea and soda. Fats and oils Butter. Lard. Ghee. Sweets and desserts Baked goods, such as cake, cupcakes, pastries, cookies, and cheesecake. Seasonings and condiments Spice mixes with added salt. Ketchup. Barbecue sauce. Mayonnaise. The items listed above may not be a complete list of foods and beverages that are not recommended. Contact a dietitian for more information. Where to find more information  American Diabetes Association: www.diabetes.org Summary  You may need to make diet and lifestyle changes to help prevent the onset of diabetes. These changes can help you control blood sugar, improve cholesterol levels, and manage blood pressure.  Set weight loss goals with help from your health care team. It is recommended that most people with prediabetes lose 7% of their body weight.  Consider following a Mediterranean diet. This includes eating plenty of fresh fruits and vegetables, whole grains, beans, nuts, seeds, fish, and low-fat dairy, and using olive oil instead of other fats. This information is not intended to replace advice given to you by your health care provider. Make sure you discuss any questions you have with your health care provider. Document Revised: 03/02/2020 Document Reviewed: 03/02/2020 Elsevier Patient Education  2021 Elsevier Inc.  

## 2020-12-28 LAB — CERVICOVAGINAL ANCILLARY ONLY
Bacterial Vaginitis (gardnerella): NEGATIVE
Candida Glabrata: POSITIVE — AB
Candida Vaginitis: POSITIVE — AB
Chlamydia: NEGATIVE
Comment: NEGATIVE
Comment: NEGATIVE
Comment: NEGATIVE
Comment: NEGATIVE
Comment: NEGATIVE
Comment: NORMAL
Neisseria Gonorrhea: NEGATIVE
Trichomonas: NEGATIVE

## 2020-12-28 LAB — RPR: RPR Ser Ql: NONREACTIVE

## 2020-12-28 LAB — HEPATITIS B SURFACE ANTIGEN: Hepatitis B Surface Ag: NONREACTIVE

## 2020-12-28 LAB — HIV ANTIBODY (ROUTINE TESTING W REFLEX): HIV 1&2 Ab, 4th Generation: NONREACTIVE

## 2021-01-02 ENCOUNTER — Other Ambulatory Visit: Payer: Self-pay | Admitting: Certified Nurse Midwife

## 2021-01-02 ENCOUNTER — Other Ambulatory Visit: Payer: Self-pay | Admitting: Physician Assistant

## 2021-01-02 DIAGNOSIS — B3731 Acute candidiasis of vulva and vagina: Secondary | ICD-10-CM

## 2021-01-02 DIAGNOSIS — B373 Candidiasis of vulva and vagina: Secondary | ICD-10-CM

## 2021-01-02 MED ORDER — TERCONAZOLE 0.4 % VA CREA: 1.0000 | TOPICAL_CREAM | Freq: Every day | VAGINAL | 0 refills | Status: DC

## 2021-01-02 MED FILL — valACYclovir HCL 1 GM TABS: 1 | 7 days supply | Qty: 14 | Fill #0

## 2021-01-02 MED FILL — TERCONAZOLE 0.4% CREAM: 0.4 | 7 days supply | Qty: 45 | Fill #0

## 2021-01-02 MED FILL — SUMATRIPTAN SUCC 100 MG TAB: 100 | 30 days supply | Qty: 8 | Fill #1

## 2021-01-02 MED FILL — JULEBER 0.15-30 MG-MCG TABS: 0.15-30 | 84 days supply | Qty: 84 | Fill #1

## 2021-01-02 NOTE — Progress Notes (Signed)
Vaginal yeast treatement

## 2021-02-08 ENCOUNTER — Telehealth: Payer: Self-pay | Admitting: Neurology

## 2021-02-08 NOTE — Telephone Encounter (Signed)
Prior Authorization for [employer] submitted via covermymeds. Awaiting response.Contact plan to follow up on [id number]  Prior Authorization for [employer] submitted via covermymeds. Awaiting response.Contact plan to follow up on Kelsey Vaughan

## 2021-02-12 ENCOUNTER — Ambulatory Visit: Payer: Medicaid Other | Admitting: Allergy

## 2021-03-25 IMAGING — CT CT HEAD W/O CM
3 of 4 series · 13 of 47 positions shown, 15 images · non-contrast
Comparison: None.

CLINICAL DATA: Difficulty with balance and visual alteration.
Nausea.

EXAM:
CT HEAD WITHOUT CONTRAST
TECHNIQUE: Contiguous axial images were obtained from the base of the skull
through the vertex without intravenous contrast.

[Series 2: head without · axial · non-contrast · 0.41mm/px · z∈[+1370,+1480]mm · 7 of 30 slices shown, 9 images]
[im 4/30  brain]
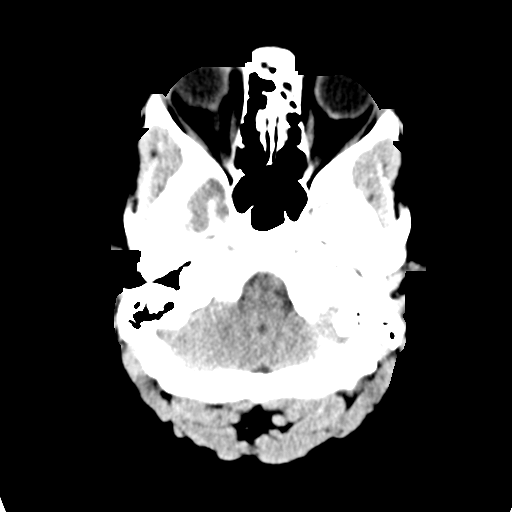
[im 4/30  bone]
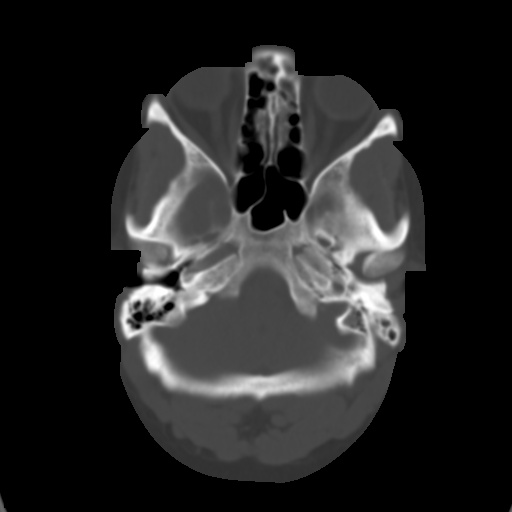
[im 8/30  brain]
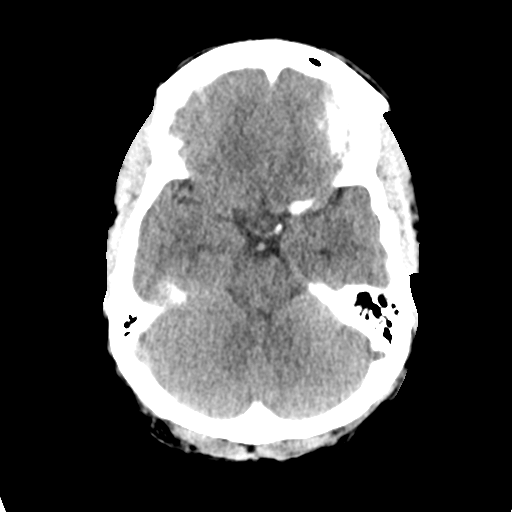
[im 11/30  brain]
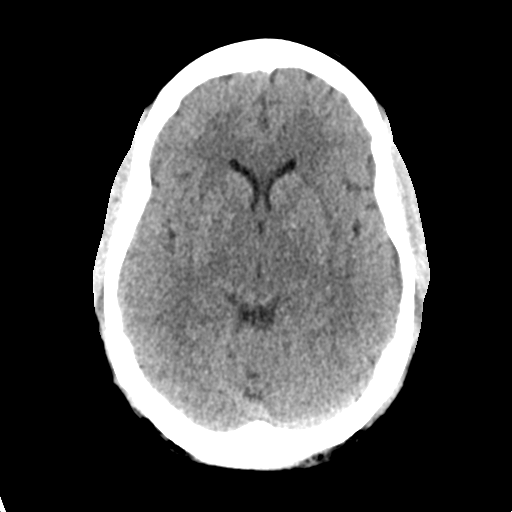
[im 15/30  brain]
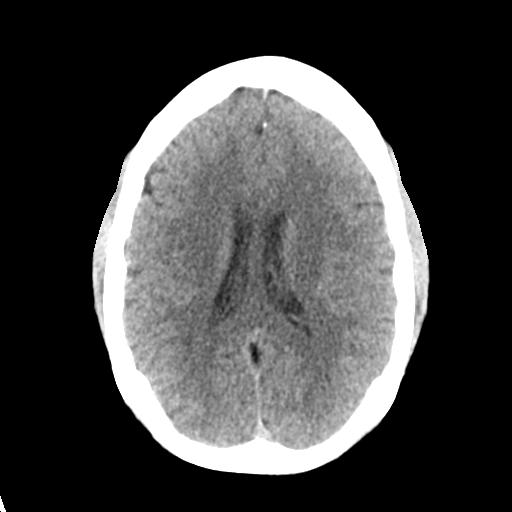
[im 19/30  brain]
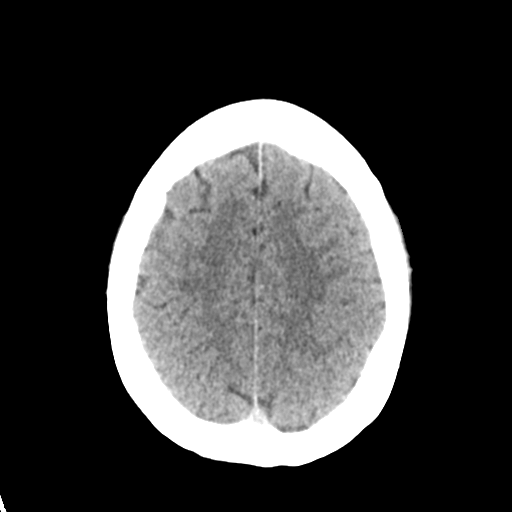
[im 19/30  bone]
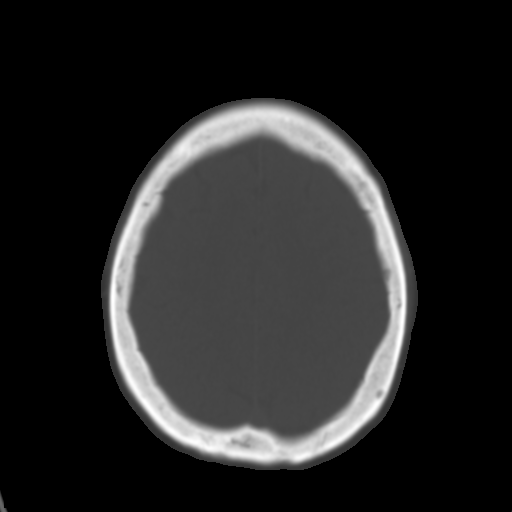
[im 22/30  brain]
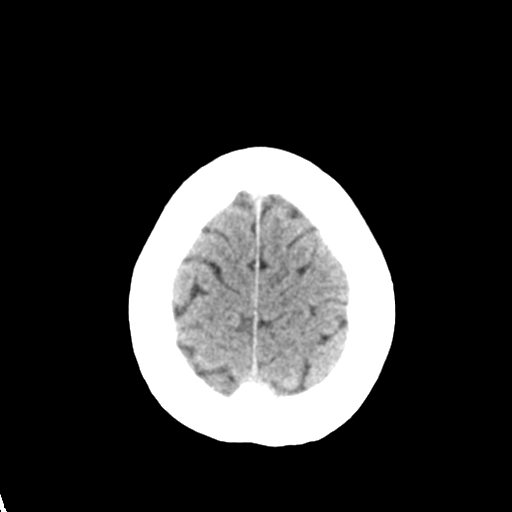
[im 26/30  brain]
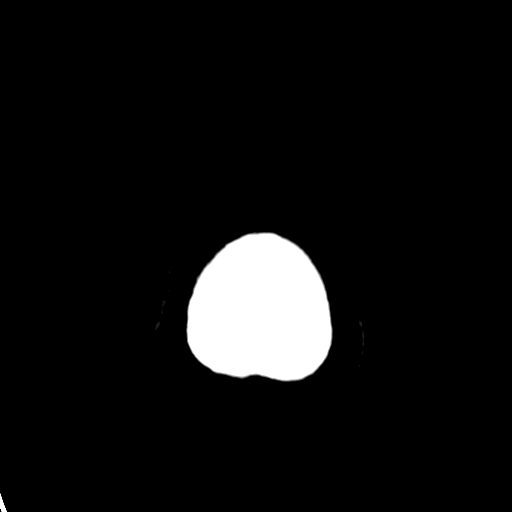

[Series 4: head without cor · coronal · non-contrast · 0.29mm/px · 3 of 67 slices shown]
[im 23/67  brain]
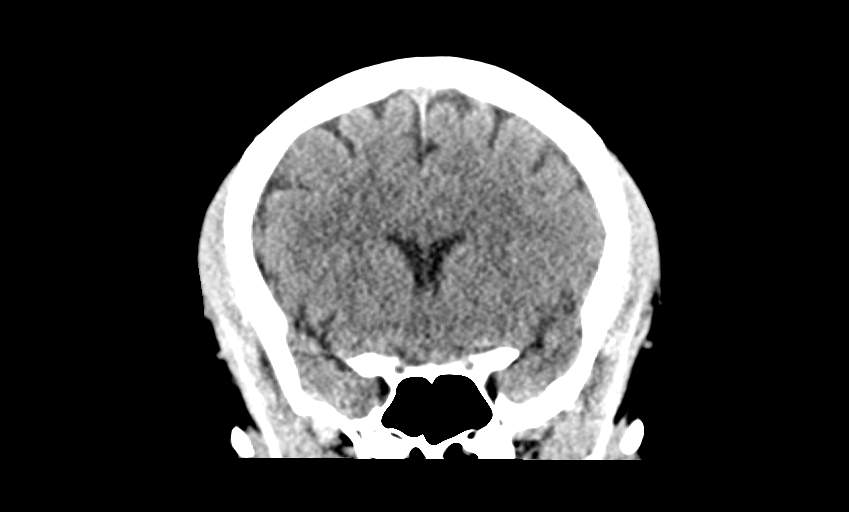
[im 30/67  brain]
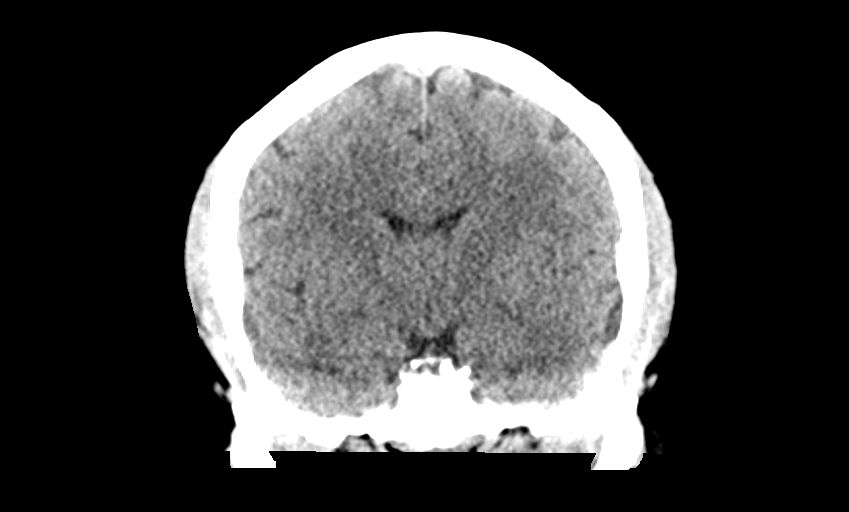
[im 37/67  brain]
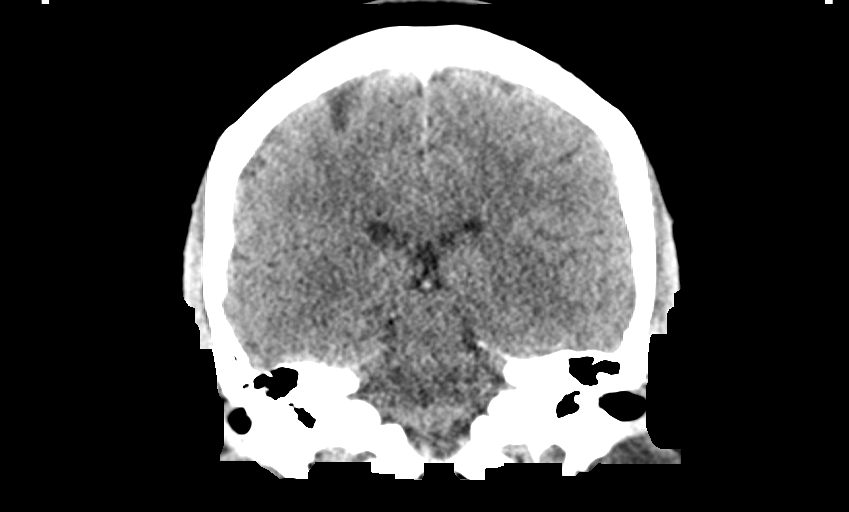

[Series 5: head without sag · sagittal · non-contrast · 0.29mm/px · 3 of 67 slices shown]
[im 23/67  brain]
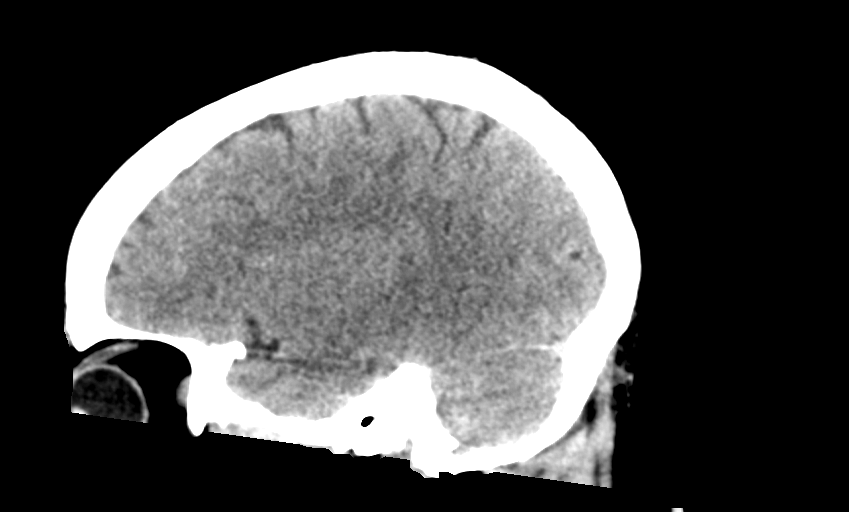
[im 34/67  brain]
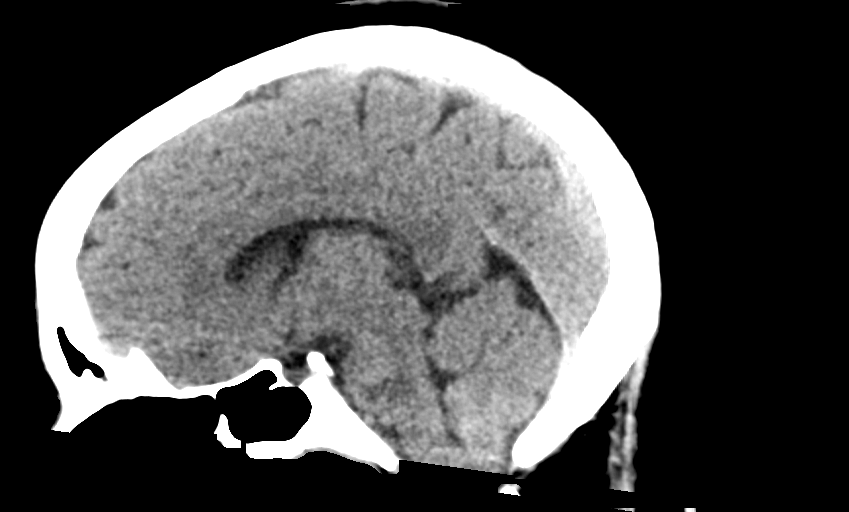
[im 45/67  brain]
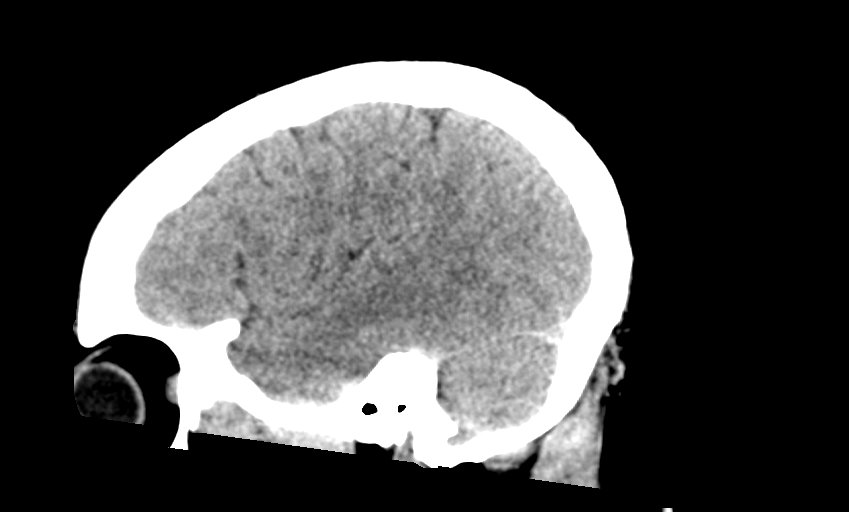

[13 of 47 positions shown; findings below may reference images not displayed]

FINDINGS: Brain: Ventricles and sulci are normal in size and configuration.
There is no intracranial mass hemorrhage, extra-axial fluid
collection, or midline shift. Brain parenchyma appears unremarkable.
No evident acute infarct.

Vascular: No hyperdense vessel.  No vascular calcifications evident.

Skull: Bony calvarium appears intact.

Sinuses/Orbits: There is mucosal thickening and opacification in
multiple ethmoid air cells. Other visualized paranasal sinuses are
clear. Visualized orbits appear symmetric bilaterally.

Other: Mastoid air cells are clear.
IMPRESSION: Ethmoid sinus disease bilaterally.  Study otherwise unremarkable.

## 2021-04-17 ENCOUNTER — Encounter: Payer: Self-pay | Admitting: Sports Medicine

## 2021-04-17 ENCOUNTER — Other Ambulatory Visit: Payer: Self-pay

## 2021-04-17 ENCOUNTER — Ambulatory Visit (INDEPENDENT_AMBULATORY_CARE_PROVIDER_SITE_OTHER): Payer: BLUE CROSS/BLUE SHIELD | Admitting: Sports Medicine

## 2021-04-17 ENCOUNTER — Ambulatory Visit (INDEPENDENT_AMBULATORY_CARE_PROVIDER_SITE_OTHER): Payer: BLUE CROSS/BLUE SHIELD

## 2021-04-17 ENCOUNTER — Ambulatory Visit: Payer: Self-pay

## 2021-04-17 DIAGNOSIS — M51369 Other intervertebral disc degeneration, lumbar region without mention of lumbar back pain or lower extremity pain: Secondary | ICD-10-CM

## 2021-04-17 DIAGNOSIS — M5136 Other intervertebral disc degeneration, lumbar region: Secondary | ICD-10-CM

## 2021-04-17 NOTE — Progress Notes (Signed)
    Procedures performed today:    Procedure: Real-time Ultrasound Guided injection of the left sacroiliac joint Device: Samsung HS60  Verbal informed consent obtained.  Time-out conducted.  Noted no overlying erythema, induration, or other signs of local infection.  Skin prepped in a sterile fashion.  Local anesthesia: Topical Ethyl chloride.  With sterile technique and under real time ultrasound guidance:  Noted normal-appearing SI joint, 1 cc Kenalog 40, 2 cc lidocaine, 2 cc bupivacaine injected easily Completed without difficulty  Advised to call if fevers/chills, erythema, induration, drainage, or persistent bleeding.  Images permanently stored and available for review in PACS.  Impression: Technically successful ultrasound guided injection.  Independent interpretation of notes and tests performed by another provider:   None.  Brief History, Exam, Impression, and Recommendations:    DDD (degenerative disc disease), lumbar Kelsey Vaughan is a 45 year old female, she has a long history of axial low back pain, left-sided historically without overt radiculopathy, lately she has had some pain going down her leg to the foot. She is interested in an SI joint injection today, she does have tenderness discretely over her left sacroiliac joint though some of her pain sounds discogenic. We did the left SI joint injection today, pain was concordant during the procedure, I would like to go ahead and get lumbar spine x-rays and set her up for formal physical therapy, return to see me in 4 to 6 weeks.    ___________________________________________ Gwen Her. Dianah Field, M.D., ABFM., CAQSM. Primary Care and [place] Instructor of [place] of [facility] of [facility]

## 2021-04-17 NOTE — Assessment & Plan Note (Signed)
Kelsey Vaughan is a 45 year old female, she has a long history of axial low back pain, left-sided historically without overt radiculopathy, lately she has had some pain going down her leg to the foot. She is interested in an SI joint injection today, she does have tenderness discretely over her left sacroiliac joint though some of her pain sounds discogenic. We did the left SI joint injection today, pain was concordant during the procedure, I would like to go ahead and get lumbar spine x-rays and set her up for formal physical therapy, return to see me in 4 to 6 weeks.

## 2021-04-25 ENCOUNTER — Ambulatory Visit: Payer: BLUE CROSS/BLUE SHIELD | Admitting: Rehabilitative and Restorative Service Providers"

## 2021-05-16 ENCOUNTER — Other Ambulatory Visit (HOSPITAL_BASED_OUTPATIENT_CLINIC_OR_DEPARTMENT_OTHER): Payer: Self-pay

## 2021-05-16 MED ORDER — MUPIROCIN 2 % EX OINT
TOPICAL_OINTMENT | CUTANEOUS | 0 refills | Status: DC
  Filled 2021-05-16: qty 22, 10d supply, fill #0

## 2021-05-25 ENCOUNTER — Other Ambulatory Visit (HOSPITAL_BASED_OUTPATIENT_CLINIC_OR_DEPARTMENT_OTHER): Payer: Self-pay

## 2021-05-29 ENCOUNTER — Other Ambulatory Visit: Payer: Self-pay

## 2021-05-29 ENCOUNTER — Other Ambulatory Visit (HOSPITAL_BASED_OUTPATIENT_CLINIC_OR_DEPARTMENT_OTHER): Payer: Self-pay

## 2021-05-29 ENCOUNTER — Ambulatory Visit (INDEPENDENT_AMBULATORY_CARE_PROVIDER_SITE_OTHER): Payer: BLUE CROSS/BLUE SHIELD | Admitting: Sports Medicine

## 2021-05-29 DIAGNOSIS — M5136 Other intervertebral disc degeneration, lumbar region: Secondary | ICD-10-CM

## 2021-05-29 DIAGNOSIS — M47816 Spondylosis without myelopathy or radiculopathy, lumbar region: Secondary | ICD-10-CM | POA: Diagnosis not present

## 2021-05-29 DIAGNOSIS — M51369 Other intervertebral disc degeneration, lumbar region without mention of lumbar back pain or lower extremity pain: Secondary | ICD-10-CM

## 2021-05-29 MED ORDER — TRIAZOLAM 0.25 MG PO TABS
ORAL_TABLET | ORAL | 0 refills | Status: DC
  Filled 2021-05-29: qty 2, 1d supply, fill #0

## 2021-05-29 NOTE — Assessment & Plan Note (Addendum)
Kelsey Vaughan returns, she is a 45 year old female with a long history of axial low back pain, occasional left-sided radiculitis with numbness in the feet. At the last visit she was interested in an SI joint injection, we did this and she had some temporary relief for a few days followed by recurrence of pain. At the time of her visit her pain did sound discogenic. I am not entirely surprised she did not get good long-term relief from the SI joint injection, I do think she is going to need an epidural, at this juncture she has failed greater than 6 weeks of physician directed conservative treatment, x-rays that showed DDD, we are going to proceed with MRI with triazolam anxiolysis, she will restart her gabapentin, and we will likely proceed with a left-sided lumbar epidural once I see her MRI. Return to see me to go over MRI results.  Update: Discs overall look okay, MRI does however show moderate to severe bilateral L5-S1 facet arthritis, pain is predominantly left-sided so we will set her up for a left-sided L5-S1 facet joint injection, if good response we will consider medial branch blocks and RFA.

## 2021-05-29 NOTE — Progress Notes (Addendum)
    Procedures performed today:    None.  Independent interpretation of notes and tests performed by another provider:   MRI personally reviewed, discs look okay, bilateral moderate L5-S1 facet arthritis.  Brief History, Exam, Impression, and Recommendations:    Lumbar facet arthropathy Kelsey Vaughan returns, she is a 44 year old female with a long history of axial low back pain, occasional left-sided radiculitis with numbness in the feet. At the last visit she was interested in an SI joint injection, we did this and she had some temporary relief for a few days followed by recurrence of pain. At the time of her visit her pain did sound discogenic. I am not entirely surprised she did not get good long-term relief from the SI joint injection, I do think she is going to need an epidural, at this juncture she has failed greater than 6 weeks of physician directed conservative treatment, x-rays that showed DDD, we are going to proceed with MRI with triazolam anxiolysis, she will restart her gabapentin, and we will likely proceed with a left-sided lumbar epidural once I see her MRI. Return to see me to go over MRI results.  Update: Discs overall look okay, MRI does however show moderate to severe bilateral L5-S1 facet arthritis, pain is predominantly left-sided so we will set her up for a left-sided L5-S1 facet joint injection, if good response we will consider medial branch blocks and RFA.    ___________________________________________ Gwen Her. Dianah Field, M.D., ABFM., CAQSM. Primary Care and [place] Instructor of [place] of [facility] of [facility]

## 2021-06-02 ENCOUNTER — Ambulatory Visit (INDEPENDENT_AMBULATORY_CARE_PROVIDER_SITE_OTHER): Payer: BLUE CROSS/BLUE SHIELD

## 2021-06-02 ENCOUNTER — Other Ambulatory Visit: Payer: Self-pay

## 2021-06-02 DIAGNOSIS — M545 Low back pain, unspecified: Secondary | ICD-10-CM

## 2021-06-02 DIAGNOSIS — M5136 Other intervertebral disc degeneration, lumbar region: Secondary | ICD-10-CM | POA: Diagnosis not present

## 2021-06-05 NOTE — Addendum Note (Signed)
Addended by: Silverio Decamp on: 06/05/2021 12:33 PM   Modules accepted: Orders

## 2021-06-07 ENCOUNTER — Other Ambulatory Visit (HOSPITAL_BASED_OUTPATIENT_CLINIC_OR_DEPARTMENT_OTHER): Payer: Self-pay

## 2021-06-12 ENCOUNTER — Encounter: Payer: Self-pay | Admitting: Physician Assistant

## 2021-06-12 ENCOUNTER — Other Ambulatory Visit (HOSPITAL_BASED_OUTPATIENT_CLINIC_OR_DEPARTMENT_OTHER): Payer: Self-pay

## 2021-06-12 ENCOUNTER — Ambulatory Visit (INDEPENDENT_AMBULATORY_CARE_PROVIDER_SITE_OTHER): Payer: BLUE CROSS/BLUE SHIELD | Admitting: Physician Assistant

## 2021-06-12 ENCOUNTER — Other Ambulatory Visit: Payer: Self-pay

## 2021-06-12 VITALS — BP 126/70 | HR 106 | Temp 98.2°F | Resp 20 | Ht 62.0 in | Wt 171.0 lb

## 2021-06-12 DIAGNOSIS — R221 Localized swelling, mass and lump, neck: Secondary | ICD-10-CM | POA: Insufficient documentation

## 2021-06-12 DIAGNOSIS — Z0000 Encounter for general adult medical examination without abnormal findings: Secondary | ICD-10-CM

## 2021-06-12 DIAGNOSIS — R3989 Other symptoms and signs involving the genitourinary system: Secondary | ICD-10-CM | POA: Diagnosis not present

## 2021-06-12 DIAGNOSIS — G479 Sleep disorder, unspecified: Secondary | ICD-10-CM

## 2021-06-12 DIAGNOSIS — R5383 Other fatigue: Secondary | ICD-10-CM

## 2021-06-12 DIAGNOSIS — Z131 Encounter for screening for diabetes mellitus: Secondary | ICD-10-CM | POA: Diagnosis not present

## 2021-06-12 DIAGNOSIS — Z13220 Encounter for screening for lipoid disorders: Secondary | ICD-10-CM

## 2021-06-12 DIAGNOSIS — Z1211 Encounter for screening for malignant neoplasm of colon: Secondary | ICD-10-CM

## 2021-06-12 LAB — POCT URINALYSIS DIP (CLINITEK)
Bilirubin, UA: NEGATIVE
Glucose, UA: NEGATIVE mg/dL
Ketones, POC UA: NEGATIVE mg/dL
Nitrite, UA: NEGATIVE
POC PROTEIN,UA: 100 — AB
Spec Grav, UA: >=1.03 — AB (ref 1.010–1.025)
Urobilinogen, UA: 4 E.U./dL — AB
pH, UA: 7 (ref 5.0–8.0)

## 2021-06-12 MED ORDER — TRAZODONE HCL 50 MG PO TABS
25.0000 mg | ORAL_TABLET | Freq: Every evening | ORAL | 1 refills | Status: DC | PRN
  Filled 2021-06-12 – 2021-07-11 (×2): qty 30, 30d supply, fill #0

## 2021-06-12 NOTE — Progress Notes (Signed)
Subjective:     Kelsey Vaughan is a 45 y.o. female and is here for a comprehensive physical exam. The patient reports problems - pt is having problems going and staying asleep with new [place] job where she goes in at [employer] and works until 12pm. She then does not want to go to sleep so does not until 8 and then only gets a few hours of sleep.  She is noticing more anxiety and depression as well as fatigue.   Social History   Socioeconomic History   Marital status: Divorced    Spouse name: Not on file   Number of children: 3   Years of education: Not on file   Highest education level: Not on file  Occupational History   Occupation: certified Psychologist, sport and exercise    Employer: [employer]  Tobacco Use   Smoking status: Every Day    Packs/day: 0.25    Years: 6.00    Pack years: 1.50    Types: Cigarettes   Smokeless tobacco: Never   Tobacco comments:    4-5 cigs/day  Vaping Use   Vaping Use: Never used  Substance and Sexual Activity   Alcohol use: Yes    Comment: occasionally   Drug use: No   Sexual activity: Not Currently    Birth control/protection: Surgical, Pill    Comment: tubal ligation  Other Topics Concern   Not on file  Social History Narrative   Not on file   Social Determinants of Health   Financial Resource Strain: Not on file  Food Insecurity: Not on file  Transportation Needs: Not on file  Physical Activity: Not on file  Stress: Not on file  Social Connections: Not on file  Intimate Partner Violence: Not on file   Health Maintenance  Topic Date Due   COVID-19 Vaccine (1) Never done   MAMMOGRAM  12/14/2021   PAP SMEAR-Modifier  09/14/2022   TETANUS/TDAP  04/29/2024   Hepatitis C Screening  Completed   HIV Screening  Completed   HPV VACCINES  Aged Out   Pneumococcal Vaccine 32-20 Years old  Discontinued   INFLUENZA VACCINE  Discontinued    The following portions of the patient's history were reviewed and updated as appropriate: allergies, current  medications, past family history, past medical history, past social history, past surgical history, and problem list.  Review of Systems Pertinent items are noted in HPI.   Objective:    BP 126/70 (BP Location: Left Arm, Patient Position: Sitting, Cuff Size: Normal)   Pulse (!) 106   Temp 98.2 F (36.8 C) (Oral)   Resp 20   Ht 5\' 2"  (1.575 m)   Wt 171 lb (77.6 kg)   SpO2 99%   BMI 31.28 kg/m  General appearance: alert, cooperative, appears stated age, and mildly obese Head: Normocephalic, without obvious abnormality, atraumatic Eyes: conjunctivae/corneas clear. PERRL, EOM's intact. Fundi benign. Ears: normal TM's and external ear canals both ears Nose: Nares normal. Septum midline. Mucosa normal. No drainage or sinus tenderness. Throat: lips, mucosa, and tongue normal; teeth and gums normal Neck: no carotid bruit, no JVD, supple, symmetrical, trachea midline, and left sided neck firmness and slightly tender anterior cervical area just under her jaw line about size of quarter.  Back: symmetric, no curvature. ROM normal. No CVA tenderness. Lungs: clear to auscultation bilaterally Heart: regular rate and rhythm, S1, S2 normal, no murmur, click, rub or gallop Abdomen: soft, non-tender; bowel sounds normal; no masses,  no organomegaly Extremities: extremities normal, atraumatic, no  cyanosis or edema Pulses: 2+ and symmetric Skin: Skin color, texture, turgor normal. No rashes or lesions Lymph nodes: Cervical, supraclavicular, and axillary nodes normal. Neurologic: Grossly normal   .. Depression screen [facility] 2/9 10/20/2019 02/17/2019 03/25/2018 02/20/2018 12/24/2017  Decreased Interest 1 - 3 1 1   Down, Depressed, Hopeless 1 3 2 3 2   PHQ - 2 Score 2 3 5 4 3   Altered sleeping 0 3 3 3 1   Tired, decreased energy 0 2 3 1 1   Change in appetite 0 3 2 1 2   Feeling bad or failure about yourself  0 2 0 2 0  Trouble concentrating 0 3 1 1 1   Moving slowly or fidgety/restless 0 3 0 2 2  Suicidal  thoughts 0 - 0 1 0  PHQ-9 Score 2 19 14 15 10   Difficult doing work/chores Not difficult at all - Very difficult Very difficult Somewhat difficult  Some recent data might be hidden   .[name] Kitchen GAD 7 : Generalized Anxiety Score 10/20/2019 02/17/2019 03/25/2018 02/20/2018  Nervous, Anxious, on Edge 2 3 3 3   Control/stop worrying 2 3 2 1   Worry too much - different things 2 3 3 3   Trouble relaxing 2 3 2 3   Restless 2 3 2 1   Easily annoyed or irritable 3 3 3 3   Afraid - awful might happen 1 1 0 0  Total GAD 7 Score 14 19 15 14   Anxiety Difficulty Somewhat difficult - Very difficult Very difficult  .[name] Kitchen Results for orders placed or performed in visit on 06/12/21  POCT URINALYSIS DIP (CLINITEK)  Result Value Ref Range   Color, UA yellow yellow   Clarity, UA clear clear   Glucose, UA negative negative mg/dL   Bilirubin, UA negative negative   Ketones, POC UA negative negative mg/dL   Spec Grav, UA >=1.030 (A) 1.010 - 1.025   Blood, UA moderate (A) negative   pH, UA 7.0 5.0 - 8.0   POC PROTEIN,UA =100 (A) negative, trace   Urobilinogen, UA 4.0 (A) 0.2 or 1.0 E.U./dL   Nitrite, UA Negative Negative   Leukocytes, UA Trace (A) Negative      Assessment:    Healthy female exam.      Plan:    [name] KitchenMarland KitchenSuzane was seen today for annual exam.  Diagnoses and all orders for this visit:  Routine physical examination -     TSH -     CBC w/Diff/Platelet -     Fe+TIBC+Fer -     COMPLETE METABOLIC PANEL WITH GFR -     Lipid Panel w/reflex Direct LDL -     VITAMIN D 25 Hydroxy (Vit-D Deficiency, Fractures) -     B12 and Folate Panel  Screening for diabetes mellitus -     COMPLETE METABOLIC PANEL WITH GFR  Screening for lipid disorders -     Lipid Panel w/reflex Direct LDL  Fatigue, unspecified type -     TSH -     CBC w/Diff/Platelet -     Fe+TIBC+Fer -     COMPLETE METABOLIC PANEL WITH GFR -     Lipid Panel w/reflex Direct LDL -     VITAMIN D 25 Hydroxy (Vit-D Deficiency, Fractures) -     B12  and Folate Panel  Mass of left side of neck -     US Soft Tissue Head/Neck (NON-THYROID); Future  Trouble in sleeping -     traZODone (DESYREL) 50 MG tablet; Take 1/2 -1 tablet (25-50 mg total) by  mouth at bedtime as needed for sleep.  Colon cancer screening -     Ambulatory referral to Gastroenterology  Abnormal urine color  .[name] Kitchen Discussed 150 minutes of exercise a week.  Encouraged vitamin D 1000 units and Calcium 1300mg  or 4 servings of dairy a day.  Fasting labs ordered.  PHQ not go goal. Declines medication. Blames on work schedule and fatigue.  Labs ordered to look closer at fatigue.  Pap UTD.  Mammogram UTD.  Colonoscopy ordered for September.  Covid vaccine done without boosters.   Discussed good sleep hygiene.  Failed melatonin.  Trial of trazodone given.   Protein and blood in urine.  Sent for culture.  Follow up urine in 2 weeks or sooner if becomes symptomatic.   On exam palpated left neck mass ordered ultrasound of neck.   Pt declined smoking cessation.   See After Visit Summary for Counseling Recommendations

## 2021-06-12 NOTE — Patient Instructions (Addendum)
Will get u/s of neck.   Health Maintenance, Female Adopting a healthy lifestyle and getting preventive care are important in promoting health and wellness. Ask your health care provider about: The right schedule for you to have regular tests and exams. Things you can do on your own to prevent diseases and keep yourself healthy. What should I know about diet, weight, and exercise? Eat a healthy diet  Eat a diet that includes plenty of vegetables, fruits, low-fat dairy products, and lean protein. Do not eat a lot of foods that are high in solid fats, added sugars, or sodium.  Maintain a healthy weight Body mass index (BMI) is used to identify weight problems. It estimates body fat based on height and weight. Your health care provider can help determineyour BMI and help you achieve or maintain a healthy weight. Get regular exercise Get regular exercise. This is one of the most important things you can do for your health. Most adults should: Exercise for at least 150 minutes each week. The exercise should increase your heart rate and make you sweat (moderate-intensity exercise). Do strengthening exercises at least twice a week. This is in addition to the moderate-intensity exercise. Spend less time sitting. Even light physical activity can be beneficial. Watch cholesterol and blood lipids Have your blood tested for lipids and cholesterol at 45 years of age, then havethis test every 5 years. Have your cholesterol levels checked more often if: Your lipid or cholesterol levels are high. You are older than 45 years of age. You are at high risk for heart disease. What should I know about cancer screening? Depending on your health history and family history, you may need to have cancer screening at various ages. This may include screening for: Breast cancer. Cervical cancer. Colorectal cancer. Skin cancer. Lung cancer. What should I know about heart disease, diabetes, and high blood  pressure? Blood pressure and heart disease High blood pressure causes heart disease and increases the risk of stroke. This is more likely to develop in people who have high blood pressure readings, are of African descent, or are overweight. Have your blood pressure checked: Every 3-5 years if you are 55-7 years of age. Every year if you are 22 years old or older. Diabetes Have regular diabetes screenings. This checks your fasting blood sugar level. Have the screening done: Once every three years after age 20 if you are at a normal weight and have a low risk for diabetes. More often and at a younger age if you are overweight or have a high risk for diabetes. What should I know about preventing infection? Hepatitis B If you have a higher risk for hepatitis B, you should be screened for this virus. Talk with your health care provider to find out if you are at risk forhepatitis B infection. Hepatitis C Testing is recommended for: Everyone born from [birth year] through 1965. Anyone with known risk factors for hepatitis C. Sexually transmitted infections (STIs) Get screened for STIs, including gonorrhea and chlamydia, if: You are sexually active and are younger than 45 years of age. You are older than 45 years of age and your health care provider tells you that you are at risk for this type of infection. Your sexual activity has changed since you were last screened, and you are at increased risk for chlamydia or gonorrhea. Ask your health care provider if you are at risk. Ask your health care provider about whether you are at high risk for HIV. Your health care provider  may recommend a prescription medicine to help prevent HIV infection. If you choose to take medicine to prevent HIV, you should first get tested for HIV. You should then be tested every 3 months for as long as you are taking the medicine. Pregnancy If you are about to stop having your period (premenopausal) and you may become pregnant,  seek counseling before you get pregnant. Take 400 to 800 micrograms (mcg) of folic acid every day if you become pregnant. Ask for birth control (contraception) if you want to prevent pregnancy. Osteoporosis and menopause Osteoporosis is a disease in which the bones lose minerals and strength with aging. This can result in bone fractures. If you are 23 years old or older, or if you are at risk for osteoporosis and fractures, ask your health care provider if you should: Be screened for bone loss. Take a calcium or vitamin D supplement to lower your risk of fractures. Be given hormone replacement therapy (HRT) to treat symptoms of menopause. Follow these instructions at home: Lifestyle Do not use any products that contain nicotine or tobacco, such as cigarettes, e-cigarettes, and chewing tobacco. If you need help quitting, ask your health care provider. Do not use street drugs. Do not share needles. Ask your health care provider for help if you need support or information about quitting drugs. Alcohol use Do not drink alcohol if: Your health care provider tells you not to drink. You are pregnant, may be pregnant, or are planning to become pregnant. If you drink alcohol: Limit how much you use to 0-1 drink a day. Limit intake if you are breastfeeding. Be aware of how much alcohol is in your drink. In the [place] one drink equals one 12 oz bottle of beer (355 mL), one 5 oz glass of wine (148 mL), or one 1 oz glass of hard liquor (44 mL). General instructions Schedule regular health, dental, and eye exams. Stay current with your vaccines. Tell your health care provider if: You often feel depressed. You have ever been abused or do not feel safe at home. Summary Adopting a healthy lifestyle and getting preventive care are important in promoting health and wellness. Follow your health care provider's instructions about healthy diet, exercising, and getting tested or screened for diseases. Follow  your health care provider's instructions on monitoring your cholesterol and blood pressure. This information is not intended to replace advice given to you by your health care provider. Make sure you discuss any questions you have with your healthcare provider. Document Revised: 11/25/2018 Document Reviewed: 11/25/2018 Elsevier Patient Education  2022 [employer].

## 2021-06-13 ENCOUNTER — Other Ambulatory Visit: Payer: BLUE CROSS/BLUE SHIELD

## 2021-06-13 ENCOUNTER — Ambulatory Visit
Admission: RE | Admit: 2021-06-13 | Discharge: 2021-06-13 | Disposition: A | Discharge status: Home / Self Care | Payer: BLUE CROSS/BLUE SHIELD | Source: Ambulatory Visit | Attending: Sports Medicine | Admitting: Sports Medicine

## 2021-06-13 DIAGNOSIS — M47816 Spondylosis without myelopathy or radiculopathy, lumbar region: Secondary | ICD-10-CM

## 2021-06-13 DIAGNOSIS — M5136 Other intervertebral disc degeneration, lumbar region: Secondary | ICD-10-CM

## 2021-06-13 MED ORDER — METHYLPREDNISOLONE ACETATE 40 MG/ML INJ SUSP (RADIOLOG
80.0000 mg | Freq: Once | INTRAMUSCULAR | Status: AC
  Administered 2021-06-13: 80 mg via INTRA_ARTICULAR

## 2021-06-13 MED ORDER — IOPAMIDOL (ISOVUE-M 200) INJECTION 41%
1.0000 mL | Freq: Once | INTRAMUSCULAR | Status: AC
  Administered 2021-06-13: 1 mL via INTRA_ARTICULAR

## 2021-06-13 NOTE — Discharge Instructions (Signed)

## 2021-06-14 ENCOUNTER — Encounter: Payer: Self-pay | Admitting: Physician Assistant

## 2021-06-14 ENCOUNTER — Ambulatory Visit (INDEPENDENT_AMBULATORY_CARE_PROVIDER_SITE_OTHER): Payer: BLUE CROSS/BLUE SHIELD

## 2021-06-14 ENCOUNTER — Other Ambulatory Visit: Payer: Self-pay

## 2021-06-14 ENCOUNTER — Ambulatory Visit (INDEPENDENT_AMBULATORY_CARE_PROVIDER_SITE_OTHER): Payer: BLUE CROSS/BLUE SHIELD | Admitting: Family Medicine

## 2021-06-14 ENCOUNTER — Encounter: Payer: Self-pay | Admitting: Family Medicine

## 2021-06-14 VITALS — BP 101/64 | HR 104 | Temp 99.3°F | Ht 62.0 in | Wt 173.0 lb

## 2021-06-14 DIAGNOSIS — E538 Deficiency of other specified B group vitamins: Secondary | ICD-10-CM

## 2021-06-14 DIAGNOSIS — R109 Unspecified abdominal pain: Secondary | ICD-10-CM | POA: Diagnosis not present

## 2021-06-14 DIAGNOSIS — R221 Localized swelling, mass and lump, neck: Secondary | ICD-10-CM

## 2021-06-14 DIAGNOSIS — R350 Frequency of micturition: Secondary | ICD-10-CM

## 2021-06-14 DIAGNOSIS — E559 Vitamin D deficiency, unspecified: Secondary | ICD-10-CM

## 2021-06-14 DIAGNOSIS — N898 Other specified noninflammatory disorders of vagina: Secondary | ICD-10-CM | POA: Diagnosis not present

## 2021-06-14 DIAGNOSIS — E785 Hyperlipidemia, unspecified: Secondary | ICD-10-CM

## 2021-06-14 DIAGNOSIS — Z0000 Encounter for general adult medical examination without abnormal findings: Secondary | ICD-10-CM

## 2021-06-14 LAB — URINE CULTURE
MICRO NUMBER:: 12062341
SPECIMEN QUALITY:: ADEQUATE

## 2021-06-14 LAB — POCT URINALYSIS DIP (CLINITEK)
Bilirubin, UA: NEGATIVE
Glucose, UA: NEGATIVE mg/dL
Ketones, POC UA: NEGATIVE mg/dL
Leukocytes, UA: NEGATIVE
Nitrite, UA: NEGATIVE
POC PROTEIN,UA: 100 — AB
Spec Grav, UA: 1.025 (ref 1.010–1.025)
Urobilinogen, UA: 0.2 E.U./dL
pH, UA: 6 (ref 5.0–8.0)

## 2021-06-14 LAB — WET PREP FOR TRICH, YEAST, CLUE
MICRO NUMBER:: 12069774
Specimen Quality: ADEQUATE

## 2021-06-14 LAB — POCT URINE PREGNANCY: Preg Test, Ur: NEGATIVE

## 2021-06-14 NOTE — Patient Instructions (Signed)
Testing sent off. Will let you know as soon as we hear back. If symptoms worsen or new symptoms develop, go to the ED.

## 2021-06-14 NOTE — Progress Notes (Signed)
No significant bacteria growth to suggest any urinary infection.

## 2021-06-14 NOTE — Telephone Encounter (Signed)
Patient scheduled.

## 2021-06-14 NOTE — Progress Notes (Signed)
Acute Office Visit  Subjective:    Patient ID: Kelsey Vaughan, female    DOB: [birth year], 45 y.o.   MRN: [id number]  Chief Complaint  Patient presents with   Abdominal Pain    Urinary frequency, malodorous urine    HPI Patient is in today for abdominal pain and vaginal itching.  Patient with ongoing lower abdominal pain for the past few weeks.  She saw PCP 2 days ago for physical UA was positive but culture was negative.  She was instructed on strict precautions for follow-up.  Today she is reporting 5-10 out of 10 cramping discomfort to left right and middle lower abdomen.  States it is constant.  She did have a period last month but cannot member the exact day, states her periods are fairly irregular.  She states that she will have a UTI something just feels off.  Urine has been going back and forth between dark and clear today it is darker.  It does have an odor.  States she has not noticed any vaginal discharge however when she wiped today she did see some light yellow discharge on the toilet paper.  Only other vaginal symptom is some occasional external itching.  She denies any dysuria, body aches, back pain, other abdominal pain, diarrhea, constipation, nausea, vomiting, fevers however she has felt a little flushed in the past few days.  She also reporting fatigue for the past week or so that has worsened in the last 3 days.  States she does have a new partner and they have not been using any protection.  Reports her tubes are tied.    Past Medical History:  Diagnosis Date   Anxiety    Atrial fibrillation ([place])    a. occuring in 02/2016   B12 deficiency    Complication of anesthesia    Constipation    Foreign body in foot, left 07/2013   GERD (gastroesophageal reflux disease)    Hematuria    Irregular menses    due to oral contraceptive   Palpitations    a. 01/2017: Event monitor showing SR with PVC's.    PONV (postoperative nausea and vomiting)    Sinus tachycardia     Thyroid nodule    Vitamin D deficiency     Past Surgical History:  Procedure Laterality Date   CESAREAN SECTION     FOREIGN BODY REMOVAL Left 08/05/2013   Procedure: LEFT FOOT FOREIGN BODY REMOVAL ADULT;  Surgeon: Wylene Simmer, MD;  Location: [facility];  Service: Orthopedics;  Laterality: Left;   SHOULDER ARTHROSCOPY Left    TUBAL LIGATION      Family History  Problem Relation Age of Onset   Diabetes Mother    Hypertension Mother    Kidney disease Mother    CAD Father    Heart attack Father        died from MI at the age of 7   CAD Sister        stents placed at age 24   Breast cancer Neg Hx     Social History   Socioeconomic History   Marital status: Divorced    Spouse name: Not on file   Number of children: 3   Years of education: Not on file   Highest education level: Not on file  Occupational History   Occupation: Physiological scientist    Employer: [employer]  Tobacco Use   Smoking status: Every Day    Packs/day: 0.25    Years:  6.00    Pack years: 1.50    Types: Cigarettes   Smokeless tobacco: Never   Tobacco comments:    4-5 cigs/day  Vaping Use   Vaping Use: Never used  Substance and Sexual Activity   Alcohol use: Yes    Comment: occasionally   Drug use: No   Sexual activity: Not Currently    Birth control/protection: Surgical, Pill    Comment: tubal ligation  Other Topics Concern   Not on file  Social History Narrative   Not on file   Social Determinants of Health   Financial Resource Strain: Not on file  Food Insecurity: Not on file  Transportation Needs: Not on file  Physical Activity: Not on file  Stress: Not on file  Social Connections: Not on file  Intimate Partner Violence: Not on file    Outpatient Medications Prior to Visit  Medication Sig Dispense Refill   aspirin EC 81 MG tablet Take 1 tablet (81 mg total) by mouth daily.     desogestrel-ethinyl estradiol (APRI) 0.15-30 MG-MCG tablet TAKE 1 TABLET BY  MOUTH DAILY. 84 tablet 1   dimenhyDRINATE (DRAMAMINE) 50 MG tablet Take 1 tablet (50 mg total) by mouth every 8 (eight) hours as needed. 30 tablet 0   gabapentin (NEURONTIN) 300 MG capsule One tab PO qHS for a week, then BID for a week, then TID. May double weekly to a max of 3,600mg /day     levocetirizine (XYZAL) 5 MG tablet Take 1 tablet (5 mg total) by mouth every evening. 90 tablet 1   mometasone (NASONEX) 50 MCG/ACT nasal spray One spray in each nostril twice a day, use left hand for right nostril, and right hand for left nostril.  Please dispense one bottle. 1 g 6   mupirocin ointment (BACTROBAN) 2 % Apply 1 Application (topical) 3 times per day for 10 days 22 g 0   nebivolol (BYSTOLIC) 5 MG tablet Take 1 tablet (5 mg total) by mouth in the morning and at bedtime. 180 tablet 3   SUMAtriptan (IMITREX) 100 MG tablet TAKE 1 TABLET (100 MG TOTAL) BY MOUTH ONCE AS NEEDED FOR UP TO 1 DOSE FOR MIGRAINE. MAY REPEAT IN 2 HOURS IF HEADACHE PERSISTS OR RECURS. 10 tablet 5   traZODone (DESYREL) 50 MG tablet Take 1/2 -1 tablet (25-50 mg total) by mouth at bedtime as needed for sleep. 30 tablet 1   triazolam (HALCION) 0.25 MG tablet Take 1 - 2 tablets by mouth 2 hours before procedure or imaging. Do not drive with this medication 2 tablet 0   Ubrogepant (UBRELVY) 100 MG TABS Take 1 tablet by mouth as needed. For migraine rescue. 9 tablet 5   valACYclovir (VALTREX) 1000 MG tablet TAKE 1 TABLET BY MOUTH 2 TIMES DAILY 14 tablet 11   No facility-administered medications prior to visit.    Allergies  Allergen Reactions   Influenza Vaccines Hives and Rash   Vyvanse [Lisdexamfetamine Dimesylate]     Headache/mood changes.    Wellbutrin [Bupropion] Other (See Comments)    Possible nipple soreness/headaches/not feeling right   Hydrocodone-Acetaminophen Rash    Review of Systems All review of systems negative except what is listed in the HPI     Objective:    Physical Exam Vitals reviewed.   Constitutional:      Appearance: She is well-developed.  Abdominal:     General: Bowel sounds are normal. There is no distension.     Tenderness: There is abdominal tenderness in the right lower  quadrant, suprapubic area and left lower quadrant. There is no guarding or rebound. Negative signs include Murphy's sign and McBurney's sign.  Genitourinary:    Cervix: No cervical motion tenderness.  Skin:    General: Skin is warm and dry.     Findings: No rash.  Neurological:     General: No focal deficit present.     Mental Status: She is alert and oriented to person, place, and time.  Psychiatric:        Mood and Affect: Mood normal.        Behavior: Behavior normal.    BP 101/64   Pulse (!) 104   Temp 99.3 F (37.4 C)   Ht 5\' 2"  (1.575 m)   Wt 173 lb (78.5 kg)   SpO2 98%   BMI 31.64 kg/m  Wt Readings from Last 3 Encounters:  06/14/21 173 lb (78.5 kg)  06/12/21 171 lb (77.6 kg)  04/17/21 175 lb (79.4 kg)    Health Maintenance Due  Topic Date Due   COVID-19 Vaccine (1) Never done    There are no preventive care reminders to display for this patient.   Lab Results  Component Value Date   TSH 1.32 11/23/2020   Lab Results  Component Value Date   WBC 9.7 11/23/2020   HGB 12.8 11/23/2020   HCT 37.6 11/23/2020   MCV 84.9 11/23/2020   PLT 342 11/23/2020   Lab Results  Component Value Date   NA 139 11/23/2020   K 3.5 11/23/2020   CO2 27 11/23/2020   GLUCOSE 101 (H) 11/23/2020   BUN 10 11/23/2020   CREATININE 0.79 11/23/2020   BILITOT 0.3 11/23/2020   ALKPHOS 72 01/31/2020   AST 15 11/23/2020   ALT 21 11/23/2020   PROT 6.9 11/23/2020   ALBUMIN 3.2 (L) 01/31/2020   CALCIUM 9.1 11/23/2020   ANIONGAP 9 01/31/2020   Lab Results  Component Value Date   CHOL 188 11/23/2020   Lab Results  Component Value Date   HDL 45 (L) 11/23/2020   Lab Results  Component Value Date   LDLCALC 118 (H) 11/23/2020   Lab Results  Component Value Date   TRIG 134  11/23/2020   Lab Results  Component Value Date   CHOLHDL 4.2 11/23/2020   Lab Results  Component Value Date   HGBA1C 5.7 (H) 11/23/2020       Assessment & Plan:   1. Abdominal pain, unspecified abdominal location 2. Urinary frequency 3. Vaginal itching  Symptoms concerning for STI versus PID versus UTI versus ectopic pregnancy.  Symptoms not consistent with kidney stones however this could also be a differential.  No other GI symptoms at this time.  No cervical motion tenderness is reassuring.  Repeat UA was positive for blood this was also seen in many of her past urinalysis - recommend recheck in about 2 weeks.  We will go ahead and send for another culture even though last culture was negative.  Also want a go ahead and check for STIs, doing urine gonorrhea and chlamydia and wet prep for trichomoniasis yeast and BV.  Urine pregnancy test was negative.   We will update patient as results come in and plan of care changes.  Given strict precautions for when to go to the ED.  If symptoms fail to improve or work-up is inconclusive consider imaging.  - POCT URINALYSIS DIP (CLINITEK) - Urine Culture - C. trachomatis/N. gonorrhoeae RNA - WET PREP FOR TRICH, YEAST, CLUE - POCT urine pregnancy  Patient aware of signs/symptoms requiring further/urgent evaluation.  Follow-up if symptoms worsen or fail to improve.    Terrilyn Saver, NP

## 2021-06-15 ENCOUNTER — Other Ambulatory Visit: Payer: Self-pay | Admitting: Physician Assistant

## 2021-06-15 ENCOUNTER — Other Ambulatory Visit (HOSPITAL_BASED_OUTPATIENT_CLINIC_OR_DEPARTMENT_OTHER): Payer: Self-pay

## 2021-06-15 DIAGNOSIS — Z3009 Encounter for other general counseling and advice on contraception: Secondary | ICD-10-CM

## 2021-06-15 LAB — C. TRACHOMATIS/N. GONORRHOEAE RNA
C. trachomatis RNA, TMA: NOT DETECTED
N. gonorrhoeae RNA, TMA: NOT DETECTED

## 2021-06-15 MED ORDER — DESOGESTREL-ETHINYL ESTRADIOL 0.15-30 MG-MCG PO TABS
1.0000 | ORAL_TABLET | Freq: Every day | ORAL | 1 refills | Status: DC
  Filled 2021-06-15 – 2021-07-11 (×3): qty 84, 84d supply, fill #0
  Filled 2021-10-18: qty 84, 84d supply, fill #1

## 2021-06-15 NOTE — Progress Notes (Signed)
Negative for chlamydia, gonorrheae, trich, bacterial vaginosis, and yeast. Waiting on urine culture. If symptoms worsen over the weekend, go to the Emergency Department.

## 2021-06-15 NOTE — Progress Notes (Signed)
[name],   Non pathologically enlarged cervical lymph node. We can continue to watch it for changing features.

## 2021-06-16 LAB — URINE CULTURE
MICRO NUMBER:: 12073425
SPECIMEN QUALITY:: ADEQUATE

## 2021-06-17 ENCOUNTER — Ambulatory Visit (HOSPITAL_COMMUNITY)
Admission: EM | Admit: 2021-06-17 | Discharge: 2021-06-17 | Disposition: A | Discharge status: Home / Self Care | Payer: BLUE CROSS/BLUE SHIELD

## 2021-06-17 ENCOUNTER — Other Ambulatory Visit: Payer: Self-pay

## 2021-06-17 DIAGNOSIS — F332 Major depressive disorder, recurrent severe without psychotic features: Secondary | ICD-10-CM

## 2021-06-17 NOTE — ED Notes (Signed)
AVS reviewed with patient by Provider and all questions answered.  Patient discharged by Ludger Nutting, NP in stable condition; no acute distress noted.

## 2021-06-17 NOTE — Discharge Instructions (Addendum)
Take all medications as prescribed. Keep all follow-up appointments as scheduled.  Do not consume alcohol or use illegal drugs while on prescription medications. Report any adverse effects from your medications to your primary care provider promptly.  In the event of recurrent symptoms or worsening symptoms, call 911, a crisis hotline, or go to the nearest emergency department for evaluation.   

## 2021-06-17 NOTE — ED Provider Notes (Addendum)
Behavioral Health Urgent Care Medical Screening Exam  Patient Name: Kelsey Vaughan MRN: [id number] Date of Evaluation: 06/17/21 Chief Complaint:   Diagnosis:  Final diagnoses:  Severe episode of recurrent major depressive disorder, without psychotic features ([place])    History of Present illness: Kelsey Vaughan is a 45 y.o. female presents to [facility] urgent care reporting passive suicidal ideations.  States while she was working last night she had a thought " shoot herself with a gun."  Denies that she has access to firearms.  Denied previous inpatient admissions.  Denied previous suicide attempts.  Currently patient is adamantly denying suicidal or homicidal ideations.  Does report history of depression and is tearful throughout this assessment.   Samanth reports multiple stressors states Bryson Corona is very stressful.  States a recent break-up with her boyfriend and states she is attending school full-time. " All of this come to a head at once."  Reported she was prescribed antidepressants in the past.  States Wellbutrin made her moods tender.  And is unsure of other antidepressants that she is tried previous.   Reported undiagnosed family mental illness history.  Mother: Irritable and negative.  Discussed following up with partial hospitalization programming on 06/19/2021.  Patient was receptive to plan.  Support, encouragement  and reassurance was provided  Psychiatric Specialty Exam  Presentation  General Appearance:Appropriate for Environment  Eye Contact:Good  Speech:Clear and Coherent  Speech Volume:Normal  Handedness:Left   Mood and Affect  Mood:Depressed; Anxious  Affect:Congruent   Thought Process  Thought Processes:Coherent  Descriptions of Associations:Intact  Orientation:Full (Time, Place and Person)  Thought Content:Logical    Hallucinations:None  Ideas of Reference:None  Suicidal Thoughts:Yes, Passive Without Intent; Without Means to Carry  Out  Homicidal Thoughts:No   Sensorium  Memory:Immediate Good; Recent Good; Remote Good  Judgment:Good  Insight:Fair   Executive Functions  Concentration:Fair  Attention Span:Good  Recall:Good  Fund of Knowledge:Fair  Language:Good   Psychomotor Activity  Psychomotor Activity:Normal   Assets  Assets:Desire for Improvement; Data processing manager; Transportation   Sleep  Sleep:Fair  Number of hours:  No data recorded  Nutritional Assessment (For OBS and FBC admissions only) Has the patient had a weight loss or gain of 10 pounds or more in the last 3 months?: No Has the patient had a decrease in food intake/or appetite?: No Does the patient have dental problems?: No Does the patient have eating habits or behaviors that may be indicators of an eating disorder including binging or inducing vomiting?: No Has the patient recently lost weight without trying?: No Has the patient been eating poorly because of a decreased appetite?: No Malnutrition Screening Tool Score: 0   Physical Exam: Physical Exam Vitals reviewed.  Eyes:     Conjunctiva/sclera: Conjunctivae normal.  Cardiovascular:     Rate and Rhythm: Normal rate and regular rhythm.     Pulses: Normal pulses.  Pulmonary:     Effort: Pulmonary effort is normal.     Breath sounds: Normal breath sounds.  Skin:    General: Skin is warm.  Neurological:     Mental Status: She is alert.  Psychiatric:        Attention and Perception: Attention normal.        Mood and Affect: Mood normal.        Speech: Speech normal.        Behavior: Behavior normal. Behavior is cooperative.        Thought Content: Thought content normal. Suicidal: passive suicidal ideaitons.  Cognition and Memory: Cognition and memory normal.        Judgment: Judgment normal.   Review of Systems  HENT: Negative.    Respiratory: Negative.    Genitourinary: Negative.   Skin: Negative.   Endo/Heme/Allergies: Negative.    Psychiatric/Behavioral:  Positive for depression. Negative for hallucinations. The patient is nervous/anxious.   All other systems reviewed and are negative. Blood pressure 126/78, pulse 93, resp. rate 16, SpO2 100 %. There is no height or weight on file to calculate BMI.  Musculoskeletal: Strength & Muscle Tone: within normal limits Gait & Station: normal Patient leans: N/A   [place] MSE Discharge Disposition for Follow up and Recommendations: Based on my evaluation the patient does not appear to have an emergency medical condition and can be discharged with resources and follow up care in outpatient services for Medication Management, Partial Hospitalization Program, and Group Therapy   Derrill [place], NP 06/17/2021, 10:27 AM

## 2021-06-17 NOTE — BH Assessment (Signed)
Triage Note- ROUTINE- Been feeling overwhelmed and last night had thought "if I had access to a gun I would blow my brains out". She states nothing is going right for her lately. Pt denies current SI and hx of past suicide attempts. Pt called her doctor Dr. Alden Hipp who said she could rx medication or sent pt here to [facility]. Pt denies AVH.

## 2021-06-19 ENCOUNTER — Other Ambulatory Visit (HOSPITAL_BASED_OUTPATIENT_CLINIC_OR_DEPARTMENT_OTHER): Payer: Self-pay

## 2021-06-19 ENCOUNTER — Telehealth (HOSPITAL_COMMUNITY): Payer: Self-pay | Admitting: Licensed Clinical Social Worker

## 2021-06-19 NOTE — Progress Notes (Signed)
Your urine culture does not indicate a UTI. How are you feeling? Please schedule another appointment to be reevaluated if not improving.

## 2021-06-20 ENCOUNTER — Telehealth (HOSPITAL_COMMUNITY): Payer: Self-pay | Admitting: Physician Assistant

## 2021-06-20 NOTE — BH Assessment (Signed)
Care Management - Follow Up [facility] Discharges   Writer attempted to make contact with patient today and was unsuccessful.  Writer was able to leave a HIPPA compliant voice message and will await callback.   

## 2021-06-26 ENCOUNTER — Other Ambulatory Visit: Payer: Self-pay

## 2021-06-26 ENCOUNTER — Other Ambulatory Visit (HOSPITAL_COMMUNITY): Payer: BLUE CROSS/BLUE SHIELD | Attending: Psychiatry | Admitting: Licensed Clinical Social Worker

## 2021-06-26 ENCOUNTER — Encounter: Payer: Self-pay | Admitting: Physician Assistant

## 2021-06-26 ENCOUNTER — Other Ambulatory Visit (HOSPITAL_BASED_OUTPATIENT_CLINIC_OR_DEPARTMENT_OTHER): Payer: Self-pay

## 2021-06-26 DIAGNOSIS — F332 Major depressive disorder, recurrent severe without psychotic features: Secondary | ICD-10-CM

## 2021-07-09 ENCOUNTER — Ambulatory Visit (INDEPENDENT_AMBULATORY_CARE_PROVIDER_SITE_OTHER): Payer: BLUE CROSS/BLUE SHIELD | Admitting: Physician Assistant

## 2021-07-09 ENCOUNTER — Other Ambulatory Visit: Payer: Self-pay

## 2021-07-09 ENCOUNTER — Telehealth (HOSPITAL_COMMUNITY): Payer: Self-pay | Admitting: Licensed Clinical Social Worker

## 2021-07-09 ENCOUNTER — Other Ambulatory Visit (HOSPITAL_BASED_OUTPATIENT_CLINIC_OR_DEPARTMENT_OTHER): Payer: Self-pay

## 2021-07-09 ENCOUNTER — Encounter: Payer: Self-pay | Admitting: Physician Assistant

## 2021-07-09 VITALS — BP 117/80 | HR 100 | Ht 62.0 in | Wt 172.0 lb

## 2021-07-09 DIAGNOSIS — F39 Unspecified mood [affective] disorder: Secondary | ICD-10-CM

## 2021-07-09 DIAGNOSIS — F3163 Bipolar disorder, current episode mixed, severe, without psychotic features: Secondary | ICD-10-CM | POA: Insufficient documentation

## 2021-07-09 DIAGNOSIS — F332 Major depressive disorder, recurrent severe without psychotic features: Secondary | ICD-10-CM

## 2021-07-09 MED ORDER — CARIPRAZINE HCL 1.5 MG PO CAPS
1.5000 mg | ORAL_CAPSULE | Freq: Every day | ORAL | 1 refills | Status: DC
  Filled 2021-07-09 – 2021-07-11 (×3): qty 30, 30d supply, fill #0

## 2021-07-09 NOTE — Patient Instructions (Signed)
Trazodone may increase to '100mg'$  at bedtime.
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
## 2021-07-11 ENCOUNTER — Other Ambulatory Visit (HOSPITAL_BASED_OUTPATIENT_CLINIC_OR_DEPARTMENT_OTHER): Payer: Self-pay

## 2021-07-12 ENCOUNTER — Other Ambulatory Visit (HOSPITAL_BASED_OUTPATIENT_CLINIC_OR_DEPARTMENT_OTHER): Payer: Self-pay

## 2021-07-16 ENCOUNTER — Other Ambulatory Visit (HOSPITAL_COMMUNITY): Payer: BLUE CROSS/BLUE SHIELD

## 2021-07-16 ENCOUNTER — Other Ambulatory Visit (HOSPITAL_BASED_OUTPATIENT_CLINIC_OR_DEPARTMENT_OTHER): Payer: Self-pay

## 2021-07-16 ENCOUNTER — Telehealth (HOSPITAL_COMMUNITY): Payer: Self-pay | Admitting: Licensed Clinical Social Worker

## 2021-07-16 ENCOUNTER — Other Ambulatory Visit: Payer: Self-pay

## 2021-07-17 ENCOUNTER — Other Ambulatory Visit (HOSPITAL_COMMUNITY): Payer: BLUE CROSS/BLUE SHIELD | Attending: Psychiatry | Admitting: Licensed Clinical Social Worker

## 2021-07-17 ENCOUNTER — Encounter (HOSPITAL_COMMUNITY): Payer: Self-pay

## 2021-07-17 ENCOUNTER — Other Ambulatory Visit (HOSPITAL_COMMUNITY): Payer: BLUE CROSS/BLUE SHIELD | Admitting: Occupational Therapy

## 2021-07-17 ENCOUNTER — Other Ambulatory Visit: Payer: Self-pay

## 2021-07-17 DIAGNOSIS — F332 Major depressive disorder, recurrent severe without psychotic features: Secondary | ICD-10-CM | POA: Diagnosis not present

## 2021-07-17 DIAGNOSIS — R41844 Frontal lobe and executive function deficit: Secondary | ICD-10-CM | POA: Diagnosis not present

## 2021-07-17 DIAGNOSIS — R4589 Other symptoms and signs involving emotional state: Secondary | ICD-10-CM

## 2021-07-17 DIAGNOSIS — F331 Major depressive disorder, recurrent, moderate: Secondary | ICD-10-CM

## 2021-07-17 NOTE — Therapy (Signed)
[place], [place], [address] Phone: [phone]   Fax:  [phone] Virtual Visit via Video Note  I connected with Kelsey Vaughan on 07/17/21 at  11:30 AM EDT by a video enabled telemedicine application and verified that I am speaking with the correct person using two identifiers.  Location: Patient: Patient Home Provider: Clinic Office   I discussed the limitations of evaluation and management by telemedicine and the availability of in person appointments. The patient expressed understanding and agreed to proceed.   I discussed the assessment and treatment plan with the patient. The patient was provided an opportunity to ask questions and all were answered. The patient agreed with the plan and demonstrated an understanding of the instructions.   The patient was advised to call back or seek an in-person evaluation if the symptoms worsen or if the condition fails to improve as anticipated.  I provided 75 minutes of non-face-to-face time during this encounter. 55 minutes OT Group Therapy 20 minutes OT Evaluation  Ponciano Ort, OT   Occupational Therapy Evaluation  Patient Details  Name: Kelsey Vaughan MRN: [id number] Date of Birth: 1976/12/01 No data recorded  Encounter Date: 07/17/2021   OT End of Session - 07/17/21 1423     Visit Number 1    Number of Visits 20    Date for OT Re-Evaluation 08/14/21    Authorization Type [employer]    OT Start Time 1130   OT Eval [id number]   OT Stop Time 1225    OT Time Calculation (min) 55 min    Activity Tolerance Patient tolerated treatment well    Behavior During Therapy [facility] for tasks assessed/performed             Past Medical History:  Diagnosis Date   Anxiety    Atrial fibrillation ([place])    a. occuring in 02/2016   B12 deficiency    Complication of anesthesia    Constipation    Foreign body in foot, left 07/2013   GERD (gastroesophageal  reflux disease)    Hematuria    Irregular menses    due to oral contraceptive   Palpitations    a. 01/2017: Event monitor showing SR with PVC's.    PONV (postoperative nausea and vomiting)    Sinus tachycardia    Thyroid nodule    Vitamin D deficiency     Past Surgical History:  Procedure Laterality Date   CESAREAN SECTION     FOREIGN BODY REMOVAL Left 08/05/2013   Procedure: LEFT FOOT FOREIGN BODY REMOVAL ADULT;  Surgeon: Wylene Simmer, MD;  Location: [place];  Service: Orthopedics;  Laterality: Left;   SHOULDER ARTHROSCOPY Left    TUBAL LIGATION      There were no vitals filed for this visit.   Subjective Assessment - 07/17/21 1419     Currently in Pain? No/denies             OT Education - 07/17/21 1419     Education Details Educated on the 5 F's and provided resources/strategies/tips to improve overall health and wellness    Person(s) Educated Patient    Methods Explanation;Handout    Comprehension Verbalized understanding              OT Short Term Goals - 07/17/21 1432       OT SHORT TERM GOAL #1   Title Pt will actively engage in OT group sessions throughout duration of PHP programming, in  order to promote daily structure, social engagement, and opportunities to develop and utilize adaptive strategies to maximize functional performance in preparation for safe transition and integration back into school, work, and the community.    Time 4    Period Weeks    Status New    Target Date 08/14/21      OT SHORT TERM GOAL #2   Title Pt will demonstrate improved ability to communicate feelings/needs/wants, without being angry/irritable/aggressive, as evidenced by, active participation in OT sessions, throughout duration of PHP programming, in order to safely transition back into the community at discharge.    Time 4    Period Weeks    Status New    Target Date 08/14/21      OT SHORT TERM GOAL #3   Title Pt will identify and implement 1-3  sleep hygiene strategies she can utilize, in order to improve sleep quality/ADL performance, in preparation for safe and healthy reintegration back into the community at discharge.    Time 4    Period Weeks    Status New    Target Date 08/14/21      OT SHORT TERM GOAL #4   Title Pt will identify 1-3 stress management strategies she can utilize, in order to safely manage increased psychosocial stressors identified, with min cues, in preparation for safe transition back to the community at discharge.    Time 4    Period Weeks    Status New    Target Date 08/14/21           Occupational Therapy Assessment 07/17/2021  Kelsey Vaughan is a 45 y/o female with PMHx of MDD, anxiety, and mood disorder who was referred to the [facility] program with reports of worsening mental health symptoms in the context of stress. Pr reports several psychosocial stressors including poor sleep schedule, working overnights at [employer] (130 AM - 1130 AM), taking classes at college, and having minimal social supports. Pt reports difficulty showering, taking one every 2-3 days, though reports is doing well changing her clothes and attending to other hygiene/self-care areas. Pt reports having done counseling in the past with "limited" benefit, however reports desire to engage in [facility] programming in order to manage identified stressors and to engage meaningfully in identified areas of occuaption and ADL/iADLs. Upon approach, pt presents as calm, cooperative and forthcoming with information. Pt reports enjoying traveling and shopping and identifies goal for admission "try counseling again and hope something sticks".   Precautions/Limitations: None noted/observed  Cognition: WFL   Visual Motor: WFL   Living Situation: Pt lives alone; has 3 adult children 106, 53, and 20 with limited relationships  School/Work: Pt works FT for [employer] from 130 AM to [employer] AM; also in school for office mgmt  ADL/iADL Performance: Pt reports difficulty  taking showers, going 2-3 days without one, however reports changing into clean clothes every day. Reports lack of energy and motivation as factors   Leisure Interests and Hobbies: Enjoys travelling and previously enjoyed shopping, however notes as of recent, shopping as become more of a negative "crutch" per patient  Social Support: Denies having any social supports, however is in touch with adult children/grandchildren   What do you do when you are very stressed, angry, upset, sad or anxious? Hurt other people, Isolate from others, Yell/Scream, Cry, and Act out   What helps when you are not feeling well? Taking a shower or bath, Having a warm or cool drink, and Watching TV  What are some things that  make it MORE difficult for you when you are already upset? Being touched, Not having choices/input, Being alone/isolated, Not being able to express my opinion, People staring at me, Being criticized, Boredom/Lack of activities, Yelling, and Particular time of year  Is there anything specific that you would like help with while you're in the partial hospitalization program? Coping Skills, Anger Management, Impulse Control, Relationships, Communication, Medication , Stress Management, and Nutrition  What is your goal while you are here?  "Try counseling again"  Assessment: Pt demonstrates behavior that inhibits/restricts participation in occupation and would benefit from skilled occupational therapy services to address current difficulties with symptom management, emotion regulation, socialization, stress management, time management, job readiness, financial wellness, health and nutrition, sleep hygiene, ADL/iADL performance and leisure participation, in preparation for reintegration and return to community at discharge.   Plan: Pt will participate in skilled occupational therapy sessions (group and/or individual) in order to promote daily structure, social engagement, and opportunities to develop  and utilize adaptive strategies to maximize functional performance in preparation for safe transition and integration back into school, work, and/or the community at discharge. OT sessions will occur 4-5 x per week for 2-4 weeks.   Kelsey Vaughan, MOT, OTR/L  Group Session:  S: "I have a small circle and I am not a people person."  O: Today's group session focused on the topic of health and wellness as it relates to the impact on mental health. Discussion focused on identifying the 5 F's to wellness including Food, Fitness, Fresh air, Fellowship, and Friendship with self and soul. Group members identified areas of wellness that they would like to improve upon and were educated and offered various resources. Discussion also focused on how the food we eat impacts our mental health, along with the benefits of engaging in physical activity/exercise and getting outside for fresh air. Discussion wrapped up with group members identifying one area of wellness they could improve upon and identified a strategy to do so.    A: Kelsey Vaughan was active and independent in her participation of discussion and shared that she also struggles with socialization, however recognized that she is not a people person and does not like to engage. Pt appeared receptive to education provided and identified sleep and socialization as areas of improvement in her overall self-care.   P: Continue to attend PHP OT group sessions 5x week for 2 weeks to promote daily structure, social engagement, and opportunities to develop and utilize adaptive strategies to maximize functional performance in preparation for safe transition and integration back into school, work, and the community. Plan to address topic of relaxation strategies in next OT group session.   Plan - 07/17/21 1424     Clinical Impression Statement Kelsey Vaughan is a 45 y/o female with PMHx of MDD, anxiety, and mood disorder who was referred to the [facility] program with reports of  worsening mental health symptoms in the context of stress. Pr reports several psychosocial stressors including poor sleep schedule, working overnights at [employer] (130 AM - 1130 AM), taking classes at college, and having minimal social supports. Pt reports difficulty showering, taking one every 2-3 days, though reports is doing well changing her clothes and attending to other hygiene/self-care areas. Pt reports having done counseling in the past with "limited" benefit, however reports desire to engage in [facility] programming in order to manage identified stressors and to engage meaningfully in identified areas of occuaption and ADL/iADLs.    OT Occupational Profile and History Problem Focused Assessment - Including  review of records relating to presenting problem    Occupational performance deficits (Please refer to evaluation for details): ADL's;IADL's;Rest and Sleep;Education;Work;Leisure;Social Participation    Body Structure / Function / Physical Skills ADL    Cognitive Skills Attention;Emotional;Energy/Drive;Learn;Perception;Problem Solve;Safety Awareness;Temperament/Personality;Thought;Understand;Memory    Psychosocial Skills Coping Strategies;Environmental  Adaptations;Habits;Interpersonal Interaction;Routines and Behaviors    Rehab Potential Good    Clinical Decision Making Limited treatment options, no task modification necessary    Comorbidities Affecting Occupational Performance: May have comorbidities impacting occupational performance    Modification or Assistance to Complete Evaluation  No modification of tasks or assist necessary to complete eval    OT Frequency 5x / week    OT Duration 4 weeks    OT Treatment/Interventions Self-care/ADL training;Patient/family education;Coping strategies training;Psychosocial skills training    Consulted and Agree with Plan of Care Patient             Patient will benefit from skilled therapeutic intervention in order to improve the following deficits  and impairments:   Body Structure / Function / Physical Skills: ADL Cognitive Skills: Attention, Emotional, Energy/Drive, Learn, Perception, Problem Solve, Safety Awareness, Temperament/Personality, Thought, Understand, Memory Psychosocial Skills: Coping Strategies, Environmental  Adaptations, Habits, Interpersonal Interaction, Routines and Behaviors   Visit Diagnosis: Difficulty coping  Frontal lobe and executive function deficit  Severe episode of recurrent major depressive disorder, without psychotic features ([place])    Problem List Patient Active Problem List   Diagnosis Date Noted   Bipolar 1 disorder, mixed, severe ([place]) 07/09/2021   Mood disorder ([place]) 07/09/2021   Severe episode of recurrent major depressive disorder, without psychotic features ([place]) 07/09/2021   Mass of left side of neck 06/12/2021   Abnormal urine color 06/12/2021   Left elbow pain 10/04/2020   Numbness and tingling of right arm 06/28/2020   Metatarsalgia of right foot 01/10/2020   Choking 11/17/2019   Dysphagia 11/17/2019   Patellofemoral pain syndrome of left knee 10/27/2019   Current smoker 10/22/2019   Anxiety 10/22/2019   Supraspinatus tendon tear 09/13/2019   Chronic left-sided low back pain with left-sided sciatica 06/02/2019   Impulsiveness 02/15/2019   Inattention 02/15/2019   Dry eye of right side 01/18/2019   Pterygium of right eye 01/18/2019   Acute recurrent pansinusitis 01/18/2019   Cramping of feet 01/07/2019   Paresthesia 01/07/2019   Lumbar facet arthropathy 12/17/2018   Acute stress disorder 09/03/2018   Trouble in sleeping 09/03/2018   Fatigue 09/03/2018   Chronic glomerulonephritis 08/11/2018   Elevated fasting glucose 05/25/2018   Hypertriglyceridemia 05/25/2018   Binge-eating disorder, mild 05/20/2018   Irritability and anger 02/23/2018   Mood changes 12/24/2017   Irritable 12/24/2017   Vaginal discharge 11/04/2017   Vaginal irritation 11/04/2017   Lower abdominal  pain 11/04/2017   Severe anxiety 10/14/2017   Stress reaction 10/12/2017   Frontal headache 09/16/2017   MDD (major depressive disorder), recurrent episode, mild ([place]) 09/03/2017   Insulin resistance 07/30/2017   Epigastric abdominal pain 04/17/2017   Constipation 04/17/2017   Hematuria 04/17/2017   Chronic sinusitis 04/07/2017   Leiomyoma of uterus 02/26/2017   Renal cyst, right 02/26/2017   Sinus tachycardia 02/06/2017   Palpitations 01/29/2017   PAC (premature atrial contraction) 01/29/2017   Vitamin D deficiency 12/23/2016   B12 deficiency 12/23/2016   Dysmenorrhea 12/19/2016   Bilateral cold feet 12/19/2016   New daily persistent headache 12/19/2016   GAD (generalized anxiety disorder) 12/19/2016   Tobacco dependence 12/19/2016   Tachycardia, paroxysmal ([place]) 03/07/2016   Microscopic  hematuria 02/29/2016   Allergy to influenza vaccine 10/02/2015   Class 1 obesity due to excess calories without serious comorbidity with body mass index (BMI) of 30.0 to 30.9 in adult 11/25/2014   Neck strain 11/04/2014   Acute recurrent frontal sinusitis 10/19/2013   Sinus tarsi syndrome of left ankle 07/16/2013    Ponciano Ort 07/17/2021, 2:36 PM  [facility] PARTIAL HOSPITALIZATION PROGRAM [place], [place], [address] Phone: [phone]   Fax:  [phone]  Name: Kelsey Vaughan MRN: [id number] Date of Birth: [birth year]

## 2021-07-17 NOTE — Progress Notes (Signed)
Spoke with patient via Webex video call, used 2 identifiers to correctly identify patient. States this is her first time in PHP. Was recommended by her PCP for worsening depression. She is currently out of work after having an injury in her shoulder that required surgery. She became isolated for 6 months inside her home and was having trouble dealing with the death of her grandchild. Today is the 2 year anniversary. The baby lived 42 hours after birth but the cause is unknown. They opted to not do an autopsy and now she has regrets. She is glad to be in group today and feels its helping with the anniversary death. She is having trouble sleeping and Trazodone is not working. It will make her sleep for about 3 hours and then she wakes up. On scale 1-10 as 10 being worst she rates depression at 7 and anxiety at 0. Denies SI/HI or AV hallucinations. PHQ9=22. No other issues or complaints.
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
## 2021-07-17 NOTE — Progress Notes (Signed)
Virtual Visit via Video Note  I connected with Kelsey Vaughan on 07/18/21 at  9:00 AM EDT by a video enabled telemedicine application and verified that I am speaking with the correct person using two identifiers.  Location: Patient: Home Provider: Office   I discussed the limitations of evaluation and management by telemedicine and the availability of in person appointments. The patient expressed understanding and agreed to proceed.  I discussed the assessment and treatment plan with the patient. The patient was provided an opportunity to ask questions and all were answered. The patient agreed with the plan and demonstrated an understanding of the instructions.   The patient was advised to call back or seek an in-person evaluation if the symptoms worsen or if the condition fails to improve as anticipated.  I provided 15  minutes of non-face-to-face time during this encounter.   Derrill Center, NP    Behavioral Health Partial Program Assessment Note  Date: 07/17/2021 Name: Kelsey Vaughan MRN: [id number]  Chief Complaint: passive suicidal ideation   Subjective: " I was at work and has thoughts that I wanted to kill myself."    HPI: Patient is a 45 y.o. African American female presents with worsening depression and passive thought of death.  Reported sense of passing of her father when she was 71 year old.  she reported she has unresolved grief, loss and trauma associated with his death.  States she was very close to her father and has been unable to forget how he died.  States her grandchild passed away and continues to bring back memories of her father death.     Patient was initially evaluated at [facility] urgent care  on 06/17/2021 where she reports "while she was working last night she had a thought " shoot herself with a gun."    Denies that she has access to firearms.  Denied previous inpatient admissions.  Denied previous suicide attempts.    Currently patient is  adamantly denying suicidal or homicidal ideations.  Does report history of depression and is tearful throughout this assessment.    Rebekan reports multiple stressors states Bryson Corona is very stressful.  States a recent break-up with her boyfriend and states she is attending school full-time. " All of this come to a head at once."  Reported she was prescribed antidepressants in the past.  States Wellbutrin made her moods tender.  And is unsure of other antidepressants that she is tried previous.    Faythe Dingwall reports a strained relationship between she and her children.  States mainly due to her mood irritability as she reports she feels negative and depressed all the time.  Patient was initially initiated on Wellbutrin however states her primary care provider has started her on Vraylar.  We will make samples available until prior authorization cannot be completed. Patient was enrolled in partial psychiatric program on 07/17/21.  Primary complaints include: anxiety, depression worse, poor concentration, and relationship difficulties.  Onset of symptoms was gradual with gradually worsening course since that time. Psychosocial Stressors include the following: family, financial, and occupational.   I have reviewed the following documentation dated : past psychiatric history, past medical history, and past social and family history  Complaints of Pain: nonear Past Psychiatric History:  First psychiatric contact   Currently in treatment with Vraylar 1.'5mg'$  daily .  Substance Abuse History: none Use of Alcohol: denied Use of Caffeine: denies use Use of over the counter:   Past Surgical History:  Procedure Laterality Date   CESAREAN  SECTION     FOREIGN BODY REMOVAL Left 08/05/2013   Procedure: LEFT FOOT FOREIGN BODY REMOVAL ADULT;  Surgeon: Wylene Simmer, MD;  Location: [place];  Service: Orthopedics;  Laterality: Left;   SHOULDER ARTHROSCOPY Left    TUBAL LIGATION      Past Medical  History:  Diagnosis Date   Anxiety    Atrial fibrillation ([place])    a. occuring in 02/2016   B12 deficiency    Complication of anesthesia    Constipation    Foreign body in foot, left 07/2013   GERD (gastroesophageal reflux disease)    Hematuria    Irregular menses    due to oral contraceptive   Palpitations    a. 01/2017: Event monitor showing SR with PVC's.    PONV (postoperative nausea and vomiting)    Sinus tachycardia    Thyroid nodule    Vitamin D deficiency    Outpatient Encounter Medications as of 07/17/2021  Medication Sig   aspirin EC 81 MG tablet Take 1 tablet (81 mg total) by mouth daily.   cariprazine (VRAYLAR) 1.5 MG capsule Take 1 capsule (1.5 mg total) by mouth daily.   desogestrel-ethinyl estradiol (JULEBER) 0.15-30 MG-MCG tablet Take 1 tablet by mouth daily.   levocetirizine (XYZAL) 5 MG tablet Take 1 tablet (5 mg total) by mouth every evening.   mometasone (NASONEX) 50 MCG/ACT nasal spray One spray in each nostril twice a day, use left hand for right nostril, and right hand for left nostril.  Please dispense one bottle.   nebivolol (BYSTOLIC) 5 MG tablet Take 1 tablet (5 mg total) by mouth in the morning and at bedtime.   SUMAtriptan (IMITREX) 100 MG tablet TAKE 1 TABLET (100 MG TOTAL) BY MOUTH ONCE AS NEEDED FOR UP TO 1 DOSE FOR MIGRAINE. MAY REPEAT IN 2 HOURS IF HEADACHE PERSISTS OR RECURS.   traZODone (DESYREL) 50 MG tablet Take 1/2 -1 tablet (25-50 mg total) by mouth at bedtime as needed for sleep.   Ubrogepant (UBRELVY) 100 MG TABS Take 1 tablet by mouth as needed. For migraine rescue.   valACYclovir (VALTREX) 1000 MG tablet TAKE 1 TABLET BY MOUTH 2 TIMES DAILY   No facility-administered encounter medications on file as of 07/17/2021.   Allergies  Allergen Reactions   Influenza Vaccines Hives and Rash   Vyvanse [Lisdexamfetamine Dimesylate]     Headache/mood changes.    Wellbutrin [Bupropion] Other (See Comments)    Possible nipple soreness/headaches/not  feeling right   Hydrocodone-Acetaminophen Rash    Social History   Tobacco Use   Smoking status: Every Day    Packs/day: 0.25    Years: 6.00    Pack years: 1.50    Types: Cigarettes   Smokeless tobacco: Never   Tobacco comments:    4-5 cigs/day  Substance Use Topics   Alcohol use: Yes    Comment: occasionally   Functioning Relationships: good support system Education: Other (Specify any learning disability, behavioral disorder, special education needs, difficulty reading.):  Other Pertinent History: None Family History  Problem Relation Age of Onset   Diabetes Mother    Hypertension Mother    Kidney disease Mother    CAD Father    Heart attack Father        died from MI at the age of 73   CAD Sister        stents placed at age 27   Breast cancer Neg Hx      Review of Systems Constitutional:  negative  Objective:  There were no vitals filed for this visit.  Physical Exam:   Mental Status Exam: Appearance:  Well groomed Psychomotor::  Within Normal Limits Attention span and concentration: Normal Behavior: calm, cooperative, and adequate rapport can be established Speech:  normal volume Mood:  depressed and anxious Affect:  normal Thought Process:  Coherent Thought Content:  Logical Orientation:  person, place, and time/date Cognition:  impaired Insight:  Fair Judgment:  Intact Estimate of Intelligence: Average Fund of knowledge: Intact Memory: Recent and remote intact Abnormal movements: None Gait and station: Normal  Assessment:  Diagnosis: MDD (major depressive disorder), recurrent episode, moderate (HCC) [F33.1] 1. MDD (major depressive disorder), recurrent episode, moderate ([place])     Indications for admission: inpatient care required if not in partial hospital program  Plan: patient enrolled in Partial Hospitalization Program, patient's current medications are to be continued, a comprehensive treatment plan will be developed, and side effects  of medications have been reviewed with patient   Will make Vraylar 1.'5mg'$  samples available x 1 month    Treatment options and alternatives reviewed with patient and patient understands the above plan. Treatment plan was reviewed and agreed upon by NP T.Bobby Rumpf and patient Sherrika Riley need for group services.     Derrill Center, NP

## 2021-07-18 ENCOUNTER — Other Ambulatory Visit (HOSPITAL_COMMUNITY): Payer: BLUE CROSS/BLUE SHIELD | Admitting: Occupational Therapy

## 2021-07-18 ENCOUNTER — Other Ambulatory Visit (HOSPITAL_BASED_OUTPATIENT_CLINIC_OR_DEPARTMENT_OTHER): Payer: Self-pay

## 2021-07-18 ENCOUNTER — Other Ambulatory Visit (HOSPITAL_COMMUNITY): Payer: BLUE CROSS/BLUE SHIELD | Admitting: Licensed Clinical Social Worker

## 2021-07-18 ENCOUNTER — Other Ambulatory Visit: Payer: Self-pay

## 2021-07-18 ENCOUNTER — Encounter (HOSPITAL_COMMUNITY): Payer: Self-pay

## 2021-07-18 DIAGNOSIS — R41844 Frontal lobe and executive function deficit: Secondary | ICD-10-CM

## 2021-07-18 DIAGNOSIS — F332 Major depressive disorder, recurrent severe without psychotic features: Secondary | ICD-10-CM | POA: Diagnosis not present

## 2021-07-18 DIAGNOSIS — R4589 Other symptoms and signs involving emotional state: Secondary | ICD-10-CM

## 2021-07-18 NOTE — Therapy (Signed)
[facility] [place], [place], [address] Phone: [phone]   Fax:  [phone] Virtual Visit via Video Note  I connected with [name] on 07/18/21 at  12:00 PM EDT by a video enabled telemedicine application and verified that I am speaking with the correct person using two identifiers.  Location: Patient: Patient Home Provider: Clinic Office   I discussed the limitations of evaluation and management by telemedicine and the availability of in person appointments. The patient expressed understanding and agreed to proceed.   I discussed the assessment and treatment plan with the patient. The patient was provided an opportunity to ask questions and all were answered. The patient agreed with the plan and demonstrated an understanding of the instructions.   The patient was advised to call back or seek an in-person evaluation if the symptoms worsen or if the condition fails to improve as anticipated.  I provided 50 minutes of non-face-to-face time during this encounter.   Ponciano Ort, OT   Occupational Therapy Treatment  Patient Details  Name: Kelsey Vaughan MRN: [id number] Date of Birth: 1976/09/24 No data recorded  Encounter Date: 07/18/2021   OT End of Session - 07/18/21 1415     Visit Number 2    Number of Visits 20    Date for OT Re-Evaluation 08/14/21    Authorization Type BCBS    OT Start Time 1200    OT Stop Time 1250    OT Time Calculation (min) 50 min    Activity Tolerance Patient tolerated treatment well    Behavior During Therapy [facility] for tasks assessed/performed             Past Medical History:  Diagnosis Date   Anxiety    Atrial fibrillation ([place])    a. occuring in 02/2016   B12 deficiency    Complication of anesthesia    Constipation    Foreign body in foot, left 07/2013   GERD (gastroesophageal reflux disease)    Hematuria    Irregular menses    due to oral  contraceptive   Palpitations    a. 01/2017: Event monitor showing SR with PVC's.    PONV (postoperative nausea and vomiting)    Sinus tachycardia    Thyroid nodule    Vitamin D deficiency     Past Surgical History:  Procedure Laterality Date   CESAREAN SECTION     FOREIGN BODY REMOVAL Left 08/05/2013   Procedure: LEFT FOOT FOREIGN BODY REMOVAL ADULT;  Surgeon: Wylene Simmer, MD;  Location: [place];  Service: Orthopedics;  Laterality: Left;   SHOULDER ARTHROSCOPY Left    TUBAL LIGATION      There were no vitals filed for this visit.   Subjective Assessment - 07/18/21 1414     Currently in Pain? No/denies               OT Education - 07/18/21 1414     Education Details Educated on fight-or-flight response and use of relaxation strategies including deep breathing, guided imagery, and PMR    Person(s) Educated Patient    Methods Explanation;Handout    Comprehension Verbalized understanding              OT Short Term Goals - 07/18/21 1416       OT SHORT TERM GOAL #1   Title Pt will actively engage in OT group sessions throughout duration of PHP programming, in order to promote daily structure, social engagement, and  opportunities to develop and utilize adaptive strategies to maximize functional performance in preparation for safe transition and integration back into school, work, and the community.    Status On-going      OT SHORT TERM GOAL #2   Title Pt will demonstrate improved ability to communicate feelings/needs/wants, without being angry/irritable/aggressive, as evidenced by, active participation in OT sessions, throughout duration of PHP programming, in order to safely transition back into the community at discharge.    Status On-going      OT SHORT TERM GOAL #3   Title Pt will identify and implement 1-3 sleep hygiene strategies she can utilize, in order to improve sleep quality/ADL performance, in preparation for safe and healthy reintegration  back into the community at discharge.    Status On-going      OT SHORT TERM GOAL #4   Title Pt will identify 1-3 stress management strategies she can utilize, in order to safely manage increased psychosocial stressors identified, with min cues, in preparation for safe transition back to the community at discharge.    Status On-going           Group Session:  S: "I like to play a game on my phone before I go to bed and as a way to calm myself down."  O:  Group began with a warm-up activity and group members were encouraged to share and identify ways in which they have practiced relaxation in the past, including any specific strategies or hobbies. Today's discussion focused on the topic of RELAXATION and patients reviewed and engaged in a variety of relaxation strategies techniques including deep breathing, guided imagery/meditation, and progressive muscle relaxation. After each strategy was reviewed, group members were invited to engage in an active practice of relaxation strategies identified. Review and background on the fight-or-flight response was also provided.   A: Eulalie was active and independent in her participation of discussion and activity and shared that one way she likes to relax or wind down before bed is to play a game on her phone. She shared that she also does this during the day sometimes as a way to calm herself down in certain emotional circumstances. She appeared receptive to strategies discussed and identified interest in trying out guided imagery again, as this was something she has utilized in the past.   P: Continue to attend PHP OT group sessions 5x week for 2 weeks to promote daily structure, social engagement, and opportunities to develop and utilize adaptive strategies to maximize functional performance in preparation for safe transition and integration back into school, work, and the community. Plan to address topic of sleep hygiene in next OT group session.   Plan  - 07/18/21 1415     Occupational performance deficits (Please refer to evaluation for details): ADL's;IADL's;Rest and Sleep;Education;Work;Leisure;Social Participation    Body Structure / Function / Physical Skills ADL    Cognitive Skills Attention;Emotional;Energy/Drive;Learn;Perception;Problem Solve;Safety Awareness;Temperament/Personality;Thought;Understand;Memory    Psychosocial Skills Coping Strategies;Environmental  Adaptations;Habits;Interpersonal Interaction;Routines and Behaviors             Patient will benefit from skilled therapeutic intervention in order to improve the following deficits and impairments:   Body Structure / Function / Physical Skills: ADL Cognitive Skills: Attention, Emotional, Energy/Drive, Learn, Perception, Problem Solve, Safety Awareness, Temperament/Personality, Thought, Understand, Memory Psychosocial Skills: Coping Strategies, Environmental  Adaptations, Habits, Interpersonal Interaction, Routines and Behaviors   Visit Diagnosis: Difficulty coping  Frontal lobe and executive function deficit  Severe episode of recurrent major depressive disorder, without psychotic  features [facility])    Problem List Patient Active Problem List   Diagnosis Date Noted   Bipolar 1 disorder, mixed, severe ([place]) 07/09/2021   Mood disorder ([place]) 07/09/2021   Severe episode of recurrent major depressive disorder, without psychotic features ([place]) 07/09/2021   Mass of left side of neck 06/12/2021   Abnormal urine color 06/12/2021   Left elbow pain 10/04/2020   Numbness and tingling of right arm 06/28/2020   Metatarsalgia of right foot 01/10/2020   Choking 11/17/2019   Dysphagia 11/17/2019   Patellofemoral pain syndrome of left knee 10/27/2019   Current smoker 10/22/2019   Anxiety 10/22/2019   Supraspinatus tendon tear 09/13/2019   Chronic left-sided low back pain with left-sided sciatica 06/02/2019   Impulsiveness 02/15/2019   Inattention 02/15/2019   Dry eye of  right side 01/18/2019   Pterygium of right eye 01/18/2019   Acute recurrent pansinusitis 01/18/2019   Cramping of feet 01/07/2019   Paresthesia 01/07/2019   Lumbar facet arthropathy 12/17/2018   Acute stress disorder 09/03/2018   Trouble in sleeping 09/03/2018   Fatigue 09/03/2018   Chronic glomerulonephritis 08/11/2018   Elevated fasting glucose 05/25/2018   Hypertriglyceridemia 05/25/2018   Binge-eating disorder, mild 05/20/2018   Irritability and anger 02/23/2018   Mood changes 12/24/2017   Irritable 12/24/2017   Vaginal discharge 11/04/2017   Vaginal irritation 11/04/2017   Lower abdominal pain 11/04/2017   Severe anxiety 10/14/2017   Stress reaction 10/12/2017   Frontal headache 09/16/2017   MDD (major depressive disorder), recurrent episode, mild ([place]) 09/03/2017   Insulin resistance 07/30/2017   Epigastric abdominal pain 04/17/2017   Constipation 04/17/2017   Hematuria 04/17/2017   Chronic sinusitis 04/07/2017   Leiomyoma of uterus 02/26/2017   Renal cyst, right 02/26/2017   Sinus tachycardia 02/06/2017   Palpitations 01/29/2017   PAC (premature atrial contraction) 01/29/2017   Vitamin D deficiency 12/23/2016   B12 deficiency 12/23/2016   Dysmenorrhea 12/19/2016   Bilateral cold feet 12/19/2016   New daily persistent headache 12/19/2016   GAD (generalized anxiety disorder) 12/19/2016   Tobacco dependence 12/19/2016   Tachycardia, paroxysmal ([place]) 03/07/2016   Microscopic hematuria 02/29/2016   Allergy to influenza vaccine 10/02/2015   Class 1 obesity due to excess calories without serious comorbidity with body mass index (BMI) of 30.0 to 30.9 in adult 11/25/2014   Neck strain 11/04/2014   Acute recurrent frontal sinusitis 10/19/2013   Sinus tarsi syndrome of left ankle 07/16/2013    07/18/2021  Ponciano Ort, MOT, OTR/L 07/18/2021, 2:16 PM  [facility] PARTIAL HOSPITALIZATION PROGRAM [place], [place], [address] Phone:  [phone]   Fax:  [phone]  Name: Dorlene Gensheimer MRN: [id number] Date of Birth: 1976-09-23

## 2021-07-18 NOTE — Progress Notes (Signed)
Spiritual care group: Group met via web-ex due to COVID-19 precautions. Group facilitated by Kathrynn Humble, BCC  Group focused on topic of strength. Group members reflected on what thoughts and feelings emerge when they hear this topic. They then engaged in facilitated dialog around how strength is present in their lives. This dialog focused on representing what strength had been to them in their lives (images and patterns given) and what they saw as helpful in their life now (what they needed / wanted).  Activity drew on narrative framework.  Patient Progress: Kelsey Vaughan participated in group and actively engaged in the group conversation.  [name] shared about the strength that it has taken to open up and be vulnerable and to reach out for help.  She has had some important conversations with her kids that also took significant courage.  [address] [place] Pager, [phone]

## 2021-07-19 ENCOUNTER — Encounter (HOSPITAL_COMMUNITY): Payer: Self-pay

## 2021-07-19 ENCOUNTER — Other Ambulatory Visit (HOSPITAL_COMMUNITY): Payer: BLUE CROSS/BLUE SHIELD | Admitting: Licensed Clinical Social Worker

## 2021-07-19 ENCOUNTER — Other Ambulatory Visit (HOSPITAL_COMMUNITY): Payer: BLUE CROSS/BLUE SHIELD | Admitting: Occupational Therapy

## 2021-07-19 ENCOUNTER — Other Ambulatory Visit: Payer: Self-pay

## 2021-07-19 DIAGNOSIS — R4589 Other symptoms and signs involving emotional state: Secondary | ICD-10-CM

## 2021-07-19 DIAGNOSIS — R41844 Frontal lobe and executive function deficit: Secondary | ICD-10-CM

## 2021-07-19 DIAGNOSIS — F332 Major depressive disorder, recurrent severe without psychotic features: Secondary | ICD-10-CM

## 2021-07-19 NOTE — Psych (Signed)
Virtual Visit via Video Note  I connected with Kelsey Vaughan on 07/17/21 at  9:00 AM EDT by a video enabled telemedicine application and verified that I am speaking with the correct person using two identifiers.  Location: Patient: patient home Provider: clinical home office   I discussed the limitations of evaluation and management by telemedicine and the availability of in person appointments. The patient expressed understanding and agreed to proceed.   I discussed the assessment and treatment plan with the patient. The patient was provided an opportunity to ask questions and all were answered. The patient agreed with the plan and demonstrated an understanding of the instructions.   The patient was advised to call back or seek an in-person evaluation if the symptoms worsen or if the condition fails to improve as anticipated.  [name] and pt completed treatment plan and pt stated verbal alignment with plan. Pt gave verbal consent to treatment and virtual treatment, agreement with group commitments, and permission to release information for purposes of any requested paperwork.   I provided 10 minutes of non-face-to-face time during this encounter.   Lorin Glass, LCSW

## 2021-07-19 NOTE — Therapy (Signed)
[facility] [place], [place], [address] Phone: [phone]   Fax:  [phone] Virtual Visit via Video Note  I connected with Kelsey Vaughan on 07/19/21 at  10:45 AM EDT by a video enabled telemedicine application and verified that I am speaking with the correct person using two identifiers.  Location: Patient: Patient Home Provider: Clinic Office    I discussed the limitations of evaluation and management by telemedicine and the availability of in person appointments. The patient expressed understanding and agreed to proceed.   I discussed the assessment and treatment plan with the patient. The patient was provided an opportunity to ask questions and all were answered. The patient agreed with the plan and demonstrated an understanding of the instructions.   The patient was advised to call back or seek an in-person evaluation if the symptoms worsen or if the condition fails to improve as anticipated.  I provided 60 minutes of non-face-to-face time during this encounter.   Ponciano Ort, OT   Occupational Therapy Treatment  Patient Details  Name: Kelsey Vaughan MRN: [id number] Date of Birth: [birth year] No data recorded  Encounter Date: 07/19/2021   OT End of Session - 07/19/21 1204     Visit Number 3    Number of Visits 20    Date for OT Re-Evaluation 08/14/21    Authorization Type BCBS    OT Start Time 1045    OT Stop Time 1145    OT Time Calculation (min) 60 min    Activity Tolerance Patient tolerated treatment well    Behavior During Therapy [facility] for tasks assessed/performed             Past Medical History:  Diagnosis Date   Anxiety    Atrial fibrillation ([place])    a. occuring in 02/2016   B12 deficiency    Complication of anesthesia    Constipation    Foreign body in foot, left 07/2013   GERD (gastroesophageal reflux disease)    Hematuria    Irregular menses    due to oral  contraceptive   Palpitations    a. 01/2017: Event monitor showing SR with PVC's.    PONV (postoperative nausea and vomiting)    Sinus tachycardia    Thyroid nodule    Vitamin D deficiency     Past Surgical History:  Procedure Laterality Date   CESAREAN SECTION     FOREIGN BODY REMOVAL Left 08/05/2013   Procedure: LEFT FOOT FOREIGN BODY REMOVAL ADULT;  Surgeon: Wylene Simmer, MD;  Location: [place];  Service: Orthopedics;  Laterality: Left;   SHOULDER ARTHROSCOPY Left    TUBAL LIGATION      There were no vitals filed for this visit.   Subjective Assessment - 07/19/21 1203     Currently in Pain? No/denies             OT Education - 07/19/21 1203     Education Details Educated on concept of sensory modulation and self-soothing as coping strategies through use of the eight senses    Person(s) Educated Patient    Methods Explanation;Handout    Comprehension Verbalized understanding              OT Short Term Goals - 07/18/21 1416       OT SHORT TERM GOAL #1   Title Pt will actively engage in OT group sessions throughout duration of PHP programming, in order to promote daily structure, social engagement, and  opportunities to develop and utilize adaptive strategies to maximize functional performance in preparation for safe transition and integration back into school, work, and the community.    Status On-going      OT SHORT TERM GOAL #2   Title Pt will demonstrate improved ability to communicate feelings/needs/wants, without being angry/irritable/aggressive, as evidenced by, active participation in OT sessions, throughout duration of PHP programming, in order to safely transition back into the community at discharge.    Status On-going      OT SHORT TERM GOAL #3   Title Pt will identify and implement 1-3 sleep hygiene strategies she can utilize, in order to improve sleep quality/ADL performance, in preparation for safe and healthy reintegration back into  the community at discharge.    Status On-going      OT SHORT TERM GOAL #4   Title Pt will identify 1-3 stress management strategies she can utilize, in order to safely manage increased psychosocial stressors identified, with min cues, in preparation for safe transition back to the community at discharge.    Status On-going           Group Session:  S: "There's a lot of things that I am finding on this list that would be soothing to me."  O: Today's group session focused on topic of sensory modulation and self-soothing through use of the 8 senses. Discussion introduced the concept of sensory modulation and integration, focusing on how we can utilize our body and it's senses to self-soothe or cope, when we are experiencing an over or under-whelming sensation or feeling. Group members were introduced to a sensory diet checklist as a helpful tool/resource that can be utilized to identify what activities and strategies we prefer and do not prefer based upon our response to different stimulus. The concept of alerting vs calming activities was also introduced to understand how to counteract how we are feeling (Example: when we are feeling overwhelmed/stressed, engage in something calming. When we are feeling depressed/low energy, engage in something alerting). Group members engaged actively in discussion sharing their own personal sensory likes/dislikes.    A: Kelsey Vaughan was active and independent in her participation of discussion and activity, sharing several different activities that she finds both alerting and calming as ways to self soothe. Pt identified calming activities for her are "cleaning, getting a massage, taking a warm bath, crafts, listening to music, birds, singing, rain, the sunrise, watching the clouds, essential oils, candles, tea, gum, and deep breathing." Pt identified alerting activities for her are "cleaning, driving, washing her car, yardwork, shopping, coffee, and drinking carbonated  drinks." Appeared open and receptive to additional strategies/suggestions and peer feedback.   P: Continue to attend PHP OT group sessions 5x week for 2 weeks to promote daily structure, social engagement, and opportunities to develop and utilize adaptive strategies to maximize functional performance in preparation for safe transition and integration back into school, work, and the community. Plan to address topic of control in next OT group session.  Plan - 07/19/21 1204     Occupational performance deficits (Please refer to evaluation for details): ADL's;IADL's;Rest and Sleep;Education;Work;Leisure;Social Participation    Body Structure / Function / Physical Skills ADL    Cognitive Skills Attention;Emotional;Energy/Drive;Learn;Perception;Problem Solve;Safety Awareness;Temperament/Personality;Thought;Understand;Memory    Psychosocial Skills Coping Strategies;Environmental  Adaptations;Habits;Interpersonal Interaction;Routines and Behaviors             Patient will benefit from skilled therapeutic intervention in order to improve the following deficits and impairments:   Body Structure / Function /  Physical Skills: ADL Cognitive Skills: Attention, Emotional, Energy/Drive, Learn, Perception, Problem Solve, Safety Awareness, Temperament/Personality, Thought, Understand, Memory Psychosocial Skills: Coping Strategies, Environmental  Adaptations, Habits, Interpersonal Interaction, Routines and Behaviors   Visit Diagnosis: Difficulty coping  Frontal lobe and executive function deficit  Severe episode of recurrent major depressive disorder, without psychotic features [facility])    Problem List Patient Active Problem List   Diagnosis Date Noted   Bipolar 1 disorder, mixed, severe ([place]) 07/09/2021   Mood disorder ([place]) 07/09/2021   Severe episode of recurrent major depressive disorder, without psychotic features ([place]) 07/09/2021   Mass of left side of neck 06/12/2021   Abnormal urine color  06/12/2021   Left elbow pain 10/04/2020   Numbness and tingling of right arm 06/28/2020   Metatarsalgia of right foot 01/10/2020   Choking 11/17/2019   Dysphagia 11/17/2019   Patellofemoral pain syndrome of left knee 10/27/2019   Current smoker 10/22/2019   Anxiety 10/22/2019   Supraspinatus tendon tear 09/13/2019   Chronic left-sided low back pain with left-sided sciatica 06/02/2019   Impulsiveness 02/15/2019   Inattention 02/15/2019   Dry eye of right side 01/18/2019   Pterygium of right eye 01/18/2019   Acute recurrent pansinusitis 01/18/2019   Cramping of feet 01/07/2019   Paresthesia 01/07/2019   Lumbar facet arthropathy 12/17/2018   Acute stress disorder 09/03/2018   Trouble in sleeping 09/03/2018   Fatigue 09/03/2018   Chronic glomerulonephritis 08/11/2018   Elevated fasting glucose 05/25/2018   Hypertriglyceridemia 05/25/2018   Binge-eating disorder, mild 05/20/2018   Irritability and anger 02/23/2018   Mood changes 12/24/2017   Irritable 12/24/2017   Vaginal discharge 11/04/2017   Vaginal irritation 11/04/2017   Lower abdominal pain 11/04/2017   Severe anxiety 10/14/2017   Stress reaction 10/12/2017   Frontal headache 09/16/2017   MDD (major depressive disorder), recurrent episode, mild ([place]) 09/03/2017   Insulin resistance 07/30/2017   Epigastric abdominal pain 04/17/2017   Constipation 04/17/2017   Hematuria 04/17/2017   Chronic sinusitis 04/07/2017   Leiomyoma of uterus 02/26/2017   Renal cyst, right 02/26/2017   Sinus tachycardia 02/06/2017   Palpitations 01/29/2017   PAC (premature atrial contraction) 01/29/2017   Vitamin D deficiency 12/23/2016   B12 deficiency 12/23/2016   Dysmenorrhea 12/19/2016   Bilateral cold feet 12/19/2016   New daily persistent headache 12/19/2016   GAD (generalized anxiety disorder) 12/19/2016   Tobacco dependence 12/19/2016   Tachycardia, paroxysmal ([place]) 03/07/2016   Microscopic hematuria 02/29/2016   Allergy to  influenza vaccine 10/02/2015   Class 1 obesity due to excess calories without serious comorbidity with body mass index (BMI) of 30.0 to 30.9 in adult 11/25/2014   Neck strain 11/04/2014   Acute recurrent frontal sinusitis 10/19/2013   Sinus tarsi syndrome of left ankle 07/16/2013    07/19/2021  Ponciano Ort, MOT, OTR/L  07/19/2021, 12:04 PM  [facility] [address] [place], [place], [address] Phone: [phone]   Fax:  [phone]  Name: Kelsey Vaughan MRN: [id number] Date of Birth: [birth year]

## 2021-07-19 NOTE — Psych (Signed)
Virtual Visit via Video Note  I connected with [name] on 07/17/21 at  9:00 AM EDT by a video enabled telemedicine application and verified that I am speaking with the correct person using two identifiers.  Location: Patient: patient home Provider: clinical home office   I discussed the limitations of evaluation and management by telemedicine and the availability of in person appointments. The patient expressed understanding and agreed to proceed.  I discussed the assessment and treatment plan with the patient. The patient was provided an opportunity to ask questions and all were answered. The patient agreed with the plan and demonstrated an understanding of the instructions.   The patient was advised to call back or seek an in-person evaluation if the symptoms worsen or if the condition fails to improve as anticipated.  Pt was provided 240 minutes of non-face-to-face time during this encounter.   Lorin Glass, LCSW    [facility] PHP THERAPIST PROGRESS NOTE  [name] [id number]  Session Time: 9:00 - 10:00  Participation Level: Active  Behavioral Response: CasualAlertDepressed  Type of Therapy: Group Therapy  Treatment Goals addressed: Coping  Interventions: CBT, DBT, Supportive, and Reframing  Summary: Clinician led check-in regarding current stressors and situation. Clinician utilized active listening and empathetic response and validated patient emotions. Clinician facilitated processing group on pertinent issues.   Therapist Response: Kelsey Vaughan is a 45 y.o. female who presents with depression symptoms. Patient arrived within time allowed and reports that she is feeling "not good." Patient rates her mood at a 3.5 on a scale of 1-10 with 10 being great. Pt reports today is the second anniversary of her grandson's death. Pt reports feeling she has not processed the experience due to caring for others around the situation. Pt able to process. Pt engaged  in discussion.         Session Time: 10:00 - 11:00   Participation Level: Active   Behavioral Response: CasualAlertDepressed   Type of Therapy: Group Therapy   Treatment Goals addressed: Coping   Interventions: CBT, DBT, Supportive and Reframing   Summary: [name] continued topic of DBT distress tolerance skills. [name] introduced STOP skill and group discussed ways they could apply the STOP skill in their every day life.      Therapist Response: Pt engaged in discussion and reports she can apply STOP when sprialing.         Session Time: 11:00- 12:00   Participation Level: Active   Behavioral Response: CasualAlertDepressed   Type of Therapy: Group Therapy, OT   Treatment Goals addressed: Coping   Interventions: Psychosocial skills training, Supportive   Summary: Occupational Therapy group   Therapist Response: Patient engaged in group. See OT note.           Session Time: 12:00 -1:00   Participation Level: Active   Behavioral Response: CasualAlertDepressed   Type of Therapy: Group therapy   Treatment Goals addressed: Coping   Interventions: CBT; Solution focused; Supportive; Reframing   Summary: 12:00 - 12:50: [name] led discussion on trust and how to establish healthy trust. Group discussed common missteps such as disclosing personal information too soon, ignoring behaviors being displayed, inacurately defining the relationship, and not giving people credit. [name] utilized CBT thought challenging principles to encourage pt's to rely on "evidence" versus feelings. Group members shared struggles they experience with trust.  12:50 -1:00 Clinician led check-out. Clinician assessed for immediate needs, medication compliance and efficacy, and safety concerns   Therapist Response: 12:50 - 1:00: Pt engaged in discussion  and is able to identify  areas of improvement in their trust process.  12:50 - 1:00: At check-out, patient rates her mood at a 5 on a scale of 1-10 with 10  being great. Pt reports afternoon plans of resting at home. Pt demonstrates some progress as evidenced by participating in first group session. Patient denies SI/HI at the end of group.      Suicidal/Homicidal: Nowithout intent/plan  Plan: Pt will continue in PHP while working to decrease depression symptoms, increase positive events, and increase ability to manage symptoms in a healthy manner.   Diagnosis: MDD (major depressive disorder), recurrent severe, without psychosis (Kelsey Vaughan) [F33.2]    1. MDD (major depressive disorder), recurrent severe, without psychosis (Kelsey Vaughan)   2. MDD (major depressive disorder), recurrent episode, moderate (Kelsey Vaughan)       Lorin Glass, LCSW 07/19/2021

## 2021-07-20 ENCOUNTER — Other Ambulatory Visit: Payer: Self-pay

## 2021-07-20 ENCOUNTER — Other Ambulatory Visit (HOSPITAL_COMMUNITY): Payer: BLUE CROSS/BLUE SHIELD

## 2021-07-23 ENCOUNTER — Other Ambulatory Visit (HOSPITAL_COMMUNITY): Payer: BLUE CROSS/BLUE SHIELD | Admitting: Occupational Therapy

## 2021-07-23 ENCOUNTER — Encounter (HOSPITAL_COMMUNITY): Payer: Self-pay

## 2021-07-23 ENCOUNTER — Other Ambulatory Visit (HOSPITAL_BASED_OUTPATIENT_CLINIC_OR_DEPARTMENT_OTHER): Payer: Self-pay

## 2021-07-23 ENCOUNTER — Other Ambulatory Visit: Payer: Self-pay

## 2021-07-23 ENCOUNTER — Other Ambulatory Visit (HOSPITAL_COMMUNITY): Payer: BLUE CROSS/BLUE SHIELD | Admitting: Licensed Clinical Social Worker

## 2021-07-23 DIAGNOSIS — F332 Major depressive disorder, recurrent severe without psychotic features: Secondary | ICD-10-CM

## 2021-07-23 DIAGNOSIS — R41844 Frontal lobe and executive function deficit: Secondary | ICD-10-CM

## 2021-07-23 DIAGNOSIS — R4589 Other symptoms and signs involving emotional state: Secondary | ICD-10-CM

## 2021-07-23 NOTE — Therapy (Signed)
[place], [place], [address] Phone: [phone]   Fax:  [phone] Virtual Visit via Video Note  I connected with [name] on 07/23/21 at  11:00 AM EDT by a video enabled telemedicine application and verified that I am speaking with the correct person using two identifiers.  Location: Patient: Patient Home Provider: Clinic Office   I discussed the limitations of evaluation and management by telemedicine and the availability of in person appointments. The patient expressed understanding and agreed to proceed.   I discussed the assessment and treatment plan with the patient. The patient was provided an opportunity to ask questions and all were answered. The patient agreed with the plan and demonstrated an understanding of the instructions.   The patient was advised to call back or seek an in-person evaluation if the symptoms worsen or if the condition fails to improve as anticipated.  I provided 60 minutes of non-face-to-face time during this encounter.   Ponciano Ort, OT   Occupational Therapy Treatment  Patient Details  Name: Kelsey Vaughan MRN: [id number] Date of Birth: 06/18/1976 No data recorded  Encounter Date: 07/23/2021   OT End of Session - 07/23/21 1332     Visit Number 4    Number of Visits 20    Date for OT Re-Evaluation 08/14/21    Authorization Type [employer]    OT Start Time 1110    OT Stop Time 1210    OT Time Calculation (min) 60 min    Activity Tolerance Patient tolerated treatment well    Behavior During Therapy [facility] for tasks assessed/performed             Past Medical History:  Diagnosis Date   Anxiety    Atrial fibrillation ([place])    a. occuring in 02/2016   B12 deficiency    Complication of anesthesia    Constipation    Foreign body in foot, left 07/2013   GERD (gastroesophageal reflux disease)    Hematuria    Irregular menses    due to oral  contraceptive   Palpitations    a. 01/2017: Event monitor showing SR with PVC's.    PONV (postoperative nausea and vomiting)    Sinus tachycardia    Thyroid nodule    Vitamin D deficiency     Past Surgical History:  Procedure Laterality Date   CESAREAN SECTION     FOREIGN BODY REMOVAL Left 08/05/2013   Procedure: LEFT FOOT FOREIGN BODY REMOVAL ADULT;  Surgeon: Wylene Simmer, MD;  Location: [place];  Service: Orthopedics;  Laterality: Left;   SHOULDER ARTHROSCOPY Left    TUBAL LIGATION      There were no vitals filed for this visit.   Subjective Assessment - 07/23/21 1332     Currently in Pain? No/denies                                  OT Education - 07/23/21 1332     Education Details Educated on self-care and provided tips/strategies to improving specific areas of self-care as it relates to one's mental health    Person(s) Educated Patient    Methods Explanation;Handout    Comprehension Verbalized understanding              OT Short Term Goals - 07/18/21 1416       OT SHORT TERM GOAL #1   Title  Pt will actively engage in OT group sessions throughout duration of PHP programming, in order to promote daily structure, social engagement, and opportunities to develop and utilize adaptive strategies to maximize functional performance in preparation for safe transition and integration back into school, work, and the community.    Status On-going      OT SHORT TERM GOAL #2   Title Pt will demonstrate improved ability to communicate feelings/needs/wants, without being angry/irritable/aggressive, as evidenced by, active participation in OT sessions, throughout duration of PHP programming, in order to safely transition back into the community at discharge.    Status On-going      OT SHORT TERM GOAL #3   Title Pt will identify and implement 1-3 sleep hygiene strategies she can utilize, in order to improve sleep quality/ADL performance, in  preparation for safe and healthy reintegration back into the community at discharge.    Status On-going      OT SHORT TERM GOAL #4   Title Pt will identify 1-3 stress management strategies she can utilize, in order to safely manage increased psychosocial stressors identified, with min cues, in preparation for safe transition back to the community at discharge.    Status On-going           Group Session:  S: "Something I have not done in awhile is just pray"  O: Today's group session focused on the topic of self-care and group member completed a self-care assessment that identified specific categories within self-care that needed improvement, including physical, emotional/psychological, social, spiritual, and professional. Discussion focused on identifying which areas need the most work/improvement and brainstormed strategies and tips to improve self-care. Group members were also encouraged to identify strengths and areas of improvement.     A:  Kamaya was active in her participation of discussion and activity, sharing that self-care for her is engaging in activities that she enjoys, such as travelling and going on more vacations. Pt identified areas of improvement as prayer, engaging in thought and reflection, and balancing work/life responsibilities.  Pt receptive to feedback, education, and support offered during session.     P: Continue to attend PHP OT group sessions 5x week for 2 weeks to promote daily structure, social engagement, and opportunities to develop and utilize adaptive strategies to maximize functional performance in preparation for safe transition and integration back into school, work, and the community. Plan to address topic of stress management in next OT group session.  Plan - 07/23/21 1332     Occupational performance deficits (Please refer to evaluation for details): ADL's;IADL's;Rest and Sleep;Education;Work;Leisure;Social Participation    Body Structure / Function /  Physical Skills ADL    Cognitive Skills Attention;Emotional;Energy/Drive;Learn;Perception;Problem Solve;Safety Awareness;Temperament/Personality;Thought;Understand;Memory    Psychosocial Skills Coping Strategies;Environmental  Adaptations;Habits;Interpersonal Interaction;Routines and Behaviors             Patient will benefit from skilled therapeutic intervention in order to improve the following deficits and impairments:   Body Structure / Function / Physical Skills: ADL Cognitive Skills: Attention, Emotional, Energy/Drive, Learn, Perception, Problem Solve, Safety Awareness, Temperament/Personality, Thought, Understand, Memory Psychosocial Skills: Coping Strategies, Environmental  Adaptations, Habits, Interpersonal Interaction, Routines and Behaviors   Visit Diagnosis: Difficulty coping  Frontal lobe and executive function deficit  Severe episode of recurrent major depressive disorder, without psychotic features [facility])    Problem List Patient Active Problem List   Diagnosis Date Noted   Bipolar 1 disorder, mixed, severe ([place]) 07/09/2021   Mood disorder ([place]) 07/09/2021   Severe episode of recurrent major  depressive disorder, without psychotic features ([place]) 07/09/2021   Mass of left side of neck 06/12/2021   Abnormal urine color 06/12/2021   Left elbow pain 10/04/2020   Numbness and tingling of right arm 06/28/2020   Metatarsalgia of right foot 01/10/2020   Choking 11/17/2019   Dysphagia 11/17/2019   Patellofemoral pain syndrome of left knee 10/27/2019   Current smoker 10/22/2019   Anxiety 10/22/2019   Supraspinatus tendon tear 09/13/2019   Chronic left-sided low back pain with left-sided sciatica 06/02/2019   Impulsiveness 02/15/2019   Inattention 02/15/2019   Dry eye of right side 01/18/2019   Pterygium of right eye 01/18/2019   Acute recurrent pansinusitis 01/18/2019   Cramping of feet 01/07/2019   Paresthesia 01/07/2019   Lumbar facet arthropathy 12/17/2018    Acute stress disorder 09/03/2018   Trouble in sleeping 09/03/2018   Fatigue 09/03/2018   Chronic glomerulonephritis 08/11/2018   Elevated fasting glucose 05/25/2018   Hypertriglyceridemia 05/25/2018   Binge-eating disorder, mild 05/20/2018   Irritability and anger 02/23/2018   Mood changes 12/24/2017   Irritable 12/24/2017   Vaginal discharge 11/04/2017   Vaginal irritation 11/04/2017   Lower abdominal pain 11/04/2017   Severe anxiety 10/14/2017   Stress reaction 10/12/2017   Frontal headache 09/16/2017   MDD (major depressive disorder), recurrent episode, mild ([place]) 09/03/2017   Insulin resistance 07/30/2017   Epigastric abdominal pain 04/17/2017   Constipation 04/17/2017   Hematuria 04/17/2017   Chronic sinusitis 04/07/2017   Leiomyoma of uterus 02/26/2017   Renal cyst, right 02/26/2017   Sinus tachycardia 02/06/2017   Palpitations 01/29/2017   PAC (premature atrial contraction) 01/29/2017   Vitamin D deficiency 12/23/2016   B12 deficiency 12/23/2016   Dysmenorrhea 12/19/2016   Bilateral cold feet 12/19/2016   New daily persistent headache 12/19/2016   GAD (generalized anxiety disorder) 12/19/2016   Tobacco dependence 12/19/2016   Tachycardia, paroxysmal ([place]) 03/07/2016   Microscopic hematuria 02/29/2016   Allergy to influenza vaccine 10/02/2015   Class 1 obesity due to excess calories without serious comorbidity with body mass index (BMI) of 30.0 to 30.9 in adult 11/25/2014   Neck strain 11/04/2014   Acute recurrent frontal sinusitis 10/19/2013   Sinus tarsi syndrome of left ankle 07/16/2013    07/23/2021  Ponciano Ort, MOT, OTR/L  07/23/2021, 1:33 PM  [facility] PARTIAL HOSPITALIZATION PROGRAM [place], [place], [address] Phone: [phone]   Fax:  [phone]  Name: Marcele Avilez MRN: [id number] Date of Birth: August 19, 1976

## 2021-07-23 NOTE — Progress Notes (Signed)
Spoke with patient via Webex video call, used 2 identifiers to correctly identify patient. States that groups are going well. She listens for the most part but finds it still helpful to hear other peoples opinions and problems. She would prefer individual therapy but still enjoys group. Today she is feeling more down. On scale 1-10 as 10 being worst she rates depression at 7 and anxiety at 7. Denies SI/HI or AV hallucinations. Has been sleeping better. No side effects from medications. No issues or complaints.

## 2021-07-24 ENCOUNTER — Other Ambulatory Visit (HOSPITAL_COMMUNITY): Payer: BLUE CROSS/BLUE SHIELD | Admitting: Licensed Clinical Social Worker

## 2021-07-24 ENCOUNTER — Encounter (HOSPITAL_COMMUNITY): Payer: Self-pay

## 2021-07-24 ENCOUNTER — Other Ambulatory Visit (HOSPITAL_COMMUNITY): Payer: BLUE CROSS/BLUE SHIELD | Admitting: Occupational Therapy

## 2021-07-24 ENCOUNTER — Other Ambulatory Visit: Payer: Self-pay

## 2021-07-24 DIAGNOSIS — F332 Major depressive disorder, recurrent severe without psychotic features: Secondary | ICD-10-CM

## 2021-07-24 DIAGNOSIS — R4589 Other symptoms and signs involving emotional state: Secondary | ICD-10-CM

## 2021-07-24 DIAGNOSIS — R41844 Frontal lobe and executive function deficit: Secondary | ICD-10-CM

## 2021-07-24 DIAGNOSIS — F331 Major depressive disorder, recurrent, moderate: Secondary | ICD-10-CM

## 2021-07-24 NOTE — Progress Notes (Signed)
Virtual Visit via Video Note  I connected with Kelsey Vaughan on 07/26/21 at  9:00 AM EDT by a video enabled telemedicine application and verified that I am speaking with the correct person using two identifiers.  Location: Patient: Home Provider: Home Office   I discussed the limitations of evaluation and management by telemedicine and the availability of in person appointments. The patient expressed understanding and agreed to proceed.    I discussed the assessment and treatment plan with the patient. The patient was provided an opportunity to ask questions and all were answered. The patient agreed with the plan and demonstrated an understanding of the instructions.   The patient was advised to call back or seek an in-person evaluation if the symptoms worsen or if the condition fails to improve as anticipated.  I provided 15 minutes of non-face-to-face time during this encounter.   Derrill Center, NP   BH MD/PA/NP OP Progress Note  07/26/2021 8:34 AM Kelsey Vaughan  MRN:  [id number]  Chief Complaint:  Kelsey Vaughan reported " I am just waiting for the medicaiton to kick in so I can feel like the lady in the commercial "   Evaluation: Kelsey Vaughan was seen and evaluated via WebEx.  She presents with a bright and pleasant affect.  Denying suicidal or homicidal ideations.  Denies auditory or visual hallucinations.  Continues to endorse symptoms of depression and negativity.  States that group has been helpful as she attempts to navigate her symptoms.  States she is utilizing some of the coping skills that she has learned.  Reports she is currently enrolled in college seeking office management degree.  "  Most of the skills have been taught through some of my classwork I am now attempting to apply what ever learned."   Kelsey Vaughan stated " I am try not to be so negative all the time. "  Rates her depression 8 out of 10 with 10 being the worst.  Patient was recently started on Vraylar 1.5 mg.   Denied any medication side effects thus far.  Reports a good appetite.  States she is resting well throughout the night.  Patient to continue group setting.  Support, encouragement reassurance was provided.  Visit Diagnosis:    ICD-10-CM   1. MDD (major depressive disorder), recurrent episode, moderate ([place])  F33.1       Past Psychiatric History:   Past Medical History:  Past Medical History:  Diagnosis Date   Anxiety    Atrial fibrillation ([place])    a. occuring in 02/2016   B12 deficiency    Complication of anesthesia    Constipation    Foreign body in foot, left 07/2013   GERD (gastroesophageal reflux disease)    Hematuria    Irregular menses    due to oral contraceptive   Palpitations    a. 01/2017: Event monitor showing SR with PVC's.    PONV (postoperative nausea and vomiting)    Sinus tachycardia    Thyroid nodule    Vitamin D deficiency     Past Surgical History:  Procedure Laterality Date   CESAREAN SECTION     FOREIGN BODY REMOVAL Left 08/05/2013   Procedure: LEFT FOOT FOREIGN BODY REMOVAL ADULT;  Surgeon: Wylene Simmer, MD;  Location: [place];  Service: Orthopedics;  Laterality: Left;   SHOULDER ARTHROSCOPY Left    TUBAL LIGATION      Family Psychiatric History:   Family History:  Family History  Problem Relation Age of Onset  Diabetes Mother    Hypertension Mother    Kidney disease Mother    CAD Father    Heart attack Father        died from MI at the age of 44   CAD Sister        stents placed at age 20   Breast cancer Neg Hx     Social History:  Social History   Socioeconomic History   Marital status: Divorced    Spouse name: Not on file   Number of children: 3   Years of education: Not on file   Highest education level: 11th grade  Occupational History   Occupation: Research scientist (life sciences): [employer]  Tobacco Use   Smoking status: Every Day    Packs/day: 0.25    Years: 6.00    Pack years: 1.50     Types: Cigarettes   Smokeless tobacco: Never   Tobacco comments:    4-5 cigs/day  Vaping Use   Vaping Use: Never used  Substance and Sexual Activity   Alcohol use: Yes    Comment: occasionally   Drug use: No   Sexual activity: Not Currently    Birth control/protection: Surgical, Pill    Comment: tubal ligation  Other Topics Concern   Not on file  Social History Narrative   Not on file   Social Determinants of Health   Financial Resource Strain: Not on file  Food Insecurity: Not on file  Transportation Needs: Not on file  Physical Activity: Not on file  Stress: Not on file  Social Connections: Not on file    Allergies:  Allergies  Allergen Reactions   Influenza Vaccines Hives and Rash   Vyvanse [Lisdexamfetamine Dimesylate]     Headache/mood changes.    Wellbutrin [Bupropion] Other (See Comments)    Possible nipple soreness/headaches/not feeling right   Hydrocodone-Acetaminophen Rash    Metabolic Disorder Labs: Lab Results  Component Value Date   HGBA1C 5.7 (H) 11/23/2020   MPG 108 05/22/2018   No results found for: PROLACTIN Lab Results  Component Value Date   CHOL 188 11/23/2020   TRIG 134 11/23/2020   HDL 45 (L) 11/23/2020   CHOLHDL 4.2 11/23/2020   LDLCALC 118 (H) 11/23/2020   LDLCALC 94 09/02/2018   Lab Results  Component Value Date   TSH 1.32 11/23/2020   TSH 1.240 06/14/2020    Therapeutic Level Labs: No results found for: LITHIUM No results found for: VALPROATE No components found for:  CBMZ  Current Medications: Current Outpatient Medications  Medication Sig Dispense Refill   aspirin EC 81 MG tablet Take 1 tablet (81 mg total) by mouth daily. (Patient not taking: No sig reported)     cariprazine (VRAYLAR) 1.5 MG capsule Take 1 capsule (1.5 mg total) by mouth daily. 30 capsule 1   desogestrel-ethinyl estradiol (JULEBER) 0.15-30 MG-MCG tablet Take 1 tablet by mouth daily. 84 tablet 1   levocetirizine (XYZAL) 5 MG tablet Take 1 tablet (5 mg  total) by mouth every evening. 90 tablet 1   mometasone (NASONEX) 50 MCG/ACT nasal spray One spray in each nostril twice a day, use left hand for right nostril, and right hand for left nostril.  Please dispense one bottle. 1 g 6   nebivolol (BYSTOLIC) 5 MG tablet Take 1 tablet (5 mg total) by mouth in the morning and at bedtime. (Patient not taking: No sig reported) 180 tablet 3   SUMAtriptan (IMITREX) 100 MG tablet TAKE 1 TABLET (  100 MG TOTAL) BY MOUTH ONCE AS NEEDED FOR UP TO 1 DOSE FOR MIGRAINE. MAY REPEAT IN 2 HOURS IF HEADACHE PERSISTS OR RECURS. 10 tablet 5   traZODone (DESYREL) 50 MG tablet Take 1/2 -1 tablet (25-50 mg total) by mouth at bedtime as needed for sleep. 30 tablet 1   Ubrogepant (UBRELVY) 100 MG TABS Take 1 tablet by mouth as needed. For migraine rescue. 9 tablet 5   valACYclovir (VALTREX) 1000 MG tablet TAKE 1 TABLET BY MOUTH 2 TIMES DAILY 14 tablet 11   No current facility-administered medications for this visit.     Musculoskeletal: Strength & Muscle Tone: within normal limits Gait & Station: normal Patient leans: N/A  Psychiatric Specialty Exam: Review of Systems  There were no vitals taken for this visit.There is no height or weight on file to calculate BMI.  General Appearance: Casual  Eye Contact:  Good  Speech:  Clear and Coherent  Volume:  Normal  Mood:  Anxious and Depressed  Affect:  Congruent  Thought Process:  Coherent  Orientation:  Full (Time, Place, and Person)  Thought Content: Logical   Suicidal Thoughts:  No  Homicidal Thoughts:  No  Memory:  Immediate;   Fair Recent;   Fair  Judgement:  Fair  Insight:  Good  Psychomotor Activity:  Normal  Concentration:  Concentration: Good  Recall:  Good  Fund of Knowledge: Good  Language: Good  Akathisia:  No  Handed:  Right  AIMS (if indicated): done  Assets:  Communication Skills Resilience Social Support  ADL's:  Intact  Cognition: WNL  Sleep:  Good   Screenings: GAD-7    Flowsheet Row  Office Visit from 07/09/2021 in [place] Office Visit from 06/12/2021 in [place] Visit from 10/20/2019 in [place]  Total GAD-7 Score '19 16 14      '$ PHQ2-9    Flowsheet Row Counselor from 07/17/2021 in [place] Office Visit from 07/09/2021 in [facility] Office Visit from 06/12/2021 in [place] Office Visit from 10/20/2019 in [place]  PHQ-2 Total Score '5 6 5 2  '$ PHQ-9 Total Score '22 23 17 2      '$ Flowsheet Row Counselor from 07/17/2021 in [place] CATEGORY Moderate Risk        Assessment and Plan:  Continue partial hospitalization programming Continue medications as indicated  Treatment plan was reviewed and agreed upon by NP Kelsey Vaughan and patient Kelsey Vaughan need for continued group services    Derrill Center, NP 07/26/2021, 8:34 AM

## 2021-07-24 NOTE — Therapy (Signed)
[facility] [place], [place], [address] Phone: [phone]   Fax:  [phone] Virtual Visit via Video Note  I connected with [name] on 07/24/21 at  11:00 AM EDT by a video enabled telemedicine application and verified that I am speaking with the correct person using two identifiers.  Location: Patient: Patient Home Provider: Clinic Office   I discussed the limitations of evaluation and management by telemedicine and the availability of in person appointments. The patient expressed understanding and agreed to proceed.   I discussed the assessment and treatment plan with the patient. The patient was provided an opportunity to ask questions and all were answered. The patient agreed with the plan and demonstrated an understanding of the instructions.   The patient was advised to call back or seek an in-person evaluation if the symptoms worsen or if the condition fails to improve as anticipated.  I provided 60 minutes of non-face-to-face time during this encounter.   Ponciano Ort, OT   Occupational Therapy Treatment  Patient Details  Name: Kelsey Vaughan MRN: [id number] Date of Birth: [birth year] No data recorded  Encounter Date: 07/24/2021   OT End of Session - 07/24/21 1537     Visit Number 5    Number of Visits 20    Date for OT Re-Evaluation 08/14/21    Authorization Type BC[employer]    OT Start Time 1110    OT Stop Time 1210    OT Time Calculation (min) 60 min    Activity Tolerance Patient tolerated treatment well    Behavior During Therapy [facility] for tasks assessed/performed             Past Medical History:  Diagnosis Date   Anxiety    Atrial fibrillation ([place])    a. occuring in 02/2016   B12 deficiency    Complication of anesthesia    Constipation    Foreign body in foot, left 07/2013   GERD (gastroesophageal reflux disease)    Hematuria    Irregular menses    due to oral  contraceptive   Palpitations    a. 01/2017: Event monitor showing SR with PVC's.    PONV (postoperative nausea and vomiting)    Sinus tachycardia    Thyroid nodule    Vitamin D deficiency     Past Surgical History:  Procedure Laterality Date   CESAREAN SECTION     FOREIGN BODY REMOVAL Left 08/05/2013   Procedure: LEFT FOOT FOREIGN BODY REMOVAL ADULT;  Surgeon: Wylene Simmer, MD;  Location: [facility];  Service: Orthopedics;  Laterality: Left;   SHOULDER ARTHROSCOPY Left    TUBAL LIGATION      There were no vitals filed for this visit.   Subjective Assessment - 07/24/21 1536     Currently in Pain? No/denies             OT Education - 07/24/21 1536     Education Details Educated on physical symptomology of stress and its effects on the body, along with positive stress management tips/strategies    Person(s) Educated Patient    Methods Explanation;Handout    Comprehension Verbalized understanding              OT Short Term Goals - 07/18/21 1416       OT SHORT TERM GOAL #1   Title Pt will actively engage in OT group sessions throughout duration of PHP programming, in order to promote daily structure, social engagement, and  opportunities to develop and utilize adaptive strategies to maximize functional performance in preparation for safe transition and integration back into school, work, and the community.    Status On-going      OT SHORT TERM GOAL #2   Title Pt will demonstrate improved ability to communicate feelings/needs/wants, without being angry/irritable/aggressive, as evidenced by, active participation in OT sessions, throughout duration of PHP programming, in order to safely transition back into the community at discharge.    Status On-going      OT SHORT TERM GOAL #3   Title Pt will identify and implement 1-3 sleep hygiene strategies she can utilize, in order to improve sleep quality/ADL performance, in preparation for safe and healthy  reintegration back into the community at discharge.    Status On-going      OT SHORT TERM GOAL #4   Title Pt will identify 1-3 stress management strategies she can utilize, in order to safely manage increased psychosocial stressors identified, with min cues, in preparation for safe transition back to the community at discharge.    Status On-going           Group Session:  S: "I try to do 3-4 school module when I get home from work."  O: Group began with a check-in and review of previous OT session focused on self-care. Today's discussion focused on the topic of stress management. Group members worked collaboratively to create a Environmental consultant identifying physical signs, behavioral signs, emotional/psychological, and cognitive signs of stress. Discussion then focused and encouraged group members to identify positive stress management strategies they could utilize in those moments to manage identified signs.   A: Crystalyn was relatively active and independent in her participation of discussion and activity, sharing that she struggles with stress at work and coming home to do schoolwork. Pt was a relatively active participant in group brainstorm/discussion and shared strategies, both positive and negative that she has used in the past to manage her stressors.  P: Continue to attend PHP OT group sessions 5x week for 2 weeks to promote daily structure, social engagement, and opportunities to develop and utilize adaptive strategies to maximize functional performance in preparation for safe transition and integration back into school, work, and the community. Plan to address topic of communication in next OT group session.  Plan - 07/24/21 1537     Occupational performance deficits (Please refer to evaluation for details): ADL's;IADL's;Rest and Sleep;Education;Work;Leisure;Social Participation    Body Structure / Function / Physical Skills ADL    Cognitive Skills  Attention;Emotional;Energy/Drive;Learn;Perception;Problem Solve;Safety Awareness;Temperament/Personality;Thought;Understand;Memory    Psychosocial Skills Coping Strategies;Environmental  Adaptations;Habits;Interpersonal Interaction;Routines and Behaviors             Patient will benefit from skilled therapeutic intervention in order to improve the following deficits and impairments:   Body Structure / Function / Physical Skills: ADL Cognitive Skills: Attention, Emotional, Energy/Drive, Learn, Perception, Problem Solve, Safety Awareness, Temperament/Personality, Thought, Understand, Memory Psychosocial Skills: Coping Strategies, Environmental  Adaptations, Habits, Interpersonal Interaction, Routines and Behaviors   Visit Diagnosis: Difficulty coping  Frontal lobe and executive function deficit  Severe episode of recurrent major depressive disorder, without psychotic features [facility])    Problem List Patient Active Problem List   Diagnosis Date Noted   Bipolar 1 disorder, mixed, severe ([place]) 07/09/2021   Mood disorder ([place]) 07/09/2021   Severe episode of recurrent major depressive disorder, without psychotic features ([place]) 07/09/2021   Mass of left side of neck 06/12/2021   Abnormal urine color 06/12/2021  Left elbow pain 10/04/2020   Numbness and tingling of right arm 06/28/2020   Metatarsalgia of right foot 01/10/2020   Choking 11/17/2019   Dysphagia 11/17/2019   Patellofemoral pain syndrome of left knee 10/27/2019   Current smoker 10/22/2019   Anxiety 10/22/2019   Supraspinatus tendon tear 09/13/2019   Chronic left-sided low back pain with left-sided sciatica 06/02/2019   Impulsiveness 02/15/2019   Inattention 02/15/2019   Dry eye of right side 01/18/2019   Pterygium of right eye 01/18/2019   Acute recurrent pansinusitis 01/18/2019   Cramping of feet 01/07/2019   Paresthesia 01/07/2019   Lumbar facet arthropathy 12/17/2018   Acute stress disorder 09/03/2018   Trouble  in sleeping 09/03/2018   Fatigue 09/03/2018   Chronic glomerulonephritis 08/11/2018   Elevated fasting glucose 05/25/2018   Hypertriglyceridemia 05/25/2018   Binge-eating disorder, mild 05/20/2018   Irritability and anger 02/23/2018   Mood changes 12/24/2017   Irritable 12/24/2017   Vaginal discharge 11/04/2017   Vaginal irritation 11/04/2017   Lower abdominal pain 11/04/2017   Severe anxiety 10/14/2017   Stress reaction 10/12/2017   Frontal headache 09/16/2017   MDD (major depressive disorder), recurrent episode, mild ([place]) 09/03/2017   Insulin resistance 07/30/2017   Epigastric abdominal pain 04/17/2017   Constipation 04/17/2017   Hematuria 04/17/2017   Chronic sinusitis 04/07/2017   Leiomyoma of uterus 02/26/2017   Renal cyst, right 02/26/2017   Sinus tachycardia 02/06/2017   Palpitations 01/29/2017   PAC (premature atrial contraction) 01/29/2017   Vitamin D deficiency 12/23/2016   B12 deficiency 12/23/2016   Dysmenorrhea 12/19/2016   Bilateral cold feet 12/19/2016   New daily persistent headache 12/19/2016   GAD (generalized anxiety disorder) 12/19/2016   Tobacco dependence 12/19/2016   Tachycardia, paroxysmal ([place]) 03/07/2016   Microscopic hematuria 02/29/2016   Allergy to influenza vaccine 10/02/2015   Class 1 obesity due to excess calories without serious comorbidity with body mass index (BMI) of 30.0 to 30.9 in adult 11/25/2014   Neck strain 11/04/2014   Acute recurrent frontal sinusitis 10/19/2013   Sinus tarsi syndrome of left ankle 07/16/2013    07/24/2021  Ponciano Ort, MOT, OTR/L  07/24/2021, 3:37 PM  [facility] PARTIAL HOSPITALIZATION PROGRAM [place], [place], [address] Phone: [phone]   Fax:  [phone]  Name: Darline Divita MRN: [id number] Date of Birth: 05/03/1976

## 2021-07-25 ENCOUNTER — Other Ambulatory Visit (HOSPITAL_COMMUNITY): Payer: BLUE CROSS/BLUE SHIELD

## 2021-07-25 ENCOUNTER — Telehealth (HOSPITAL_COMMUNITY): Payer: Self-pay | Admitting: Licensed Clinical Social Worker

## 2021-07-25 ENCOUNTER — Other Ambulatory Visit: Payer: Self-pay

## 2021-07-26 ENCOUNTER — Other Ambulatory Visit (HOSPITAL_COMMUNITY): Payer: BLUE CROSS/BLUE SHIELD | Admitting: Licensed Clinical Social Worker

## 2021-07-26 ENCOUNTER — Other Ambulatory Visit (HOSPITAL_BASED_OUTPATIENT_CLINIC_OR_DEPARTMENT_OTHER): Payer: Self-pay

## 2021-07-26 ENCOUNTER — Encounter (HOSPITAL_COMMUNITY): Payer: Self-pay

## 2021-07-26 ENCOUNTER — Other Ambulatory Visit: Payer: Self-pay

## 2021-07-26 ENCOUNTER — Encounter (HOSPITAL_COMMUNITY): Payer: Self-pay | Admitting: Family

## 2021-07-26 ENCOUNTER — Other Ambulatory Visit (HOSPITAL_COMMUNITY): Payer: BLUE CROSS/BLUE SHIELD | Admitting: Occupational Therapy

## 2021-07-26 DIAGNOSIS — F332 Major depressive disorder, recurrent severe without psychotic features: Secondary | ICD-10-CM

## 2021-07-26 DIAGNOSIS — R41844 Frontal lobe and executive function deficit: Secondary | ICD-10-CM

## 2021-07-26 DIAGNOSIS — R4589 Other symptoms and signs involving emotional state: Secondary | ICD-10-CM

## 2021-07-26 NOTE — Therapy (Signed)
Kelsey Vaughan [place], [place], [address] Phone: [phone]   Fax:  [phone] Virtual Visit via Video Note  I connected with Kelsey Vaughan on 07/26/21 at  12:00 PM EDT by a video enabled telemedicine application and verified that I am speaking with the correct person using two identifiers.  Location: Patient: Patient Home Provider: Clinic Office    I discussed the limitations of evaluation and management by telemedicine and the availability of in person appointments. The patient expressed understanding and agreed to proceed.   I discussed the assessment and treatment plan with the patient. The patient was provided an opportunity to ask questions and all were answered. The patient agreed with the plan and demonstrated an understanding of the instructions.   The patient was advised to call back or seek an in-person evaluation if the symptoms worsen or if the condition fails to improve as anticipated.  I provided 50 minutes of non-face-to-face time during this encounter.   Kelsey Vaughan, OT   Occupational Therapy Treatment  Patient Details  Name: Kelsey Vaughan MRN: [id number] Date of Birth: 04-15-1976 No data recorded  Encounter Date: 07/26/2021   OT End of Session - 07/26/21 1346     Visit Number 6    Number of Visits 20    Date for OT Re-Evaluation 08/14/21    Authorization Type [employer]BS    OT Start Time 1200    OT Stop Time 1250    OT Time Calculation (min) 50 min    Activity Tolerance Patient tolerated treatment well    Behavior During Therapy [facility] for tasks assessed/performed             Past Medical History:  Diagnosis Date   Anxiety    Atrial fibrillation ([place])    a. occuring in 02/2016   B12 deficiency    Complication of anesthesia    Constipation    Foreign body in foot, left 07/2013   GERD (gastroesophageal reflux disease)    Hematuria    Irregular menses    due to oral  contraceptive   Palpitations    a. 01/2017: Event monitor showing SR with PVC's.    PONV (postoperative nausea and vomiting)    Sinus tachycardia    Thyroid nodule    Vitamin D deficiency     Past Surgical History:  Procedure Laterality Date   CESAREAN SECTION     FOREIGN BODY REMOVAL Left 08/05/2013   Procedure: LEFT FOOT FOREIGN BODY REMOVAL ADULT;  Surgeon: Wylene Simmer, MD;  Location: [facility];  Service: Orthopedics;  Laterality: Left;   SHOULDER ARTHROSCOPY Left    TUBAL LIGATION      There were no vitals filed for this visit.   Subjective Assessment - 07/26/21 1346     Currently in Pain? No/denies                                  OT Education - 07/26/21 1346     Education Details Educated on different communication styles and identified strategies/tips to practice being more assertive    Person(s) Educated Patient    Methods Explanation;Handout    Comprehension Verbalized understanding              OT Short Term Goals - 07/18/21 1416       OT SHORT TERM GOAL #1   Title Pt will actively engage in  OT group sessions throughout duration of PHP programming, in order to promote daily structure, social engagement, and opportunities to develop and utilize adaptive strategies to maximize functional performance in preparation for safe transition and integration back into school, work, and the community.    Status On-going      OT SHORT TERM GOAL #2   Title Pt will demonstrate improved ability to communicate feelings/needs/wants, without being angry/irritable/aggressive, as evidenced by, active participation in OT sessions, throughout duration of PHP programming, in order to safely transition back into the community at discharge.    Status On-going      OT SHORT TERM GOAL #3   Title Pt will identify and implement 1-3 sleep hygiene strategies she can utilize, in order to improve sleep quality/ADL performance, in preparation for safe  and healthy reintegration back into the community at discharge.    Status On-going      OT SHORT TERM GOAL #4   Title Pt will identify 1-3 stress management strategies she can utilize, in order to safely manage increased psychosocial stressors identified, with min cues, in preparation for safe transition back to the community at discharge.    Status On-going           Group Session:  S: None noted  O: Today's group focused on topic of Communication Styles. Group members were educated on the different styles including passive, aggressive, and assertive communication. Members shared and reflected on which style they most often find themselves communicating in and how to transition to a more assertive approach.   A: Kelsey Vaughan was minimally engaged in today's session and discussion, despite cues and encouragement to engage. Pt did not have her camera on and was delayed when directly prompted to engage; will continued to monitor and encourage active engagement and participation in future sessions.   P: Continue to attend PHP OT group sessions 5x week for 2 weeks to promote daily structure, social engagement, and opportunities to develop and utilize adaptive strategies to maximize functional performance in preparation for safe transition and integration back into school, work, and the community. Plan to address topic of assertiveness in next OT group session.  Plan - 07/26/21 1346     Occupational performance deficits (Please refer to evaluation for details): ADL's;IADL's;Rest and Sleep;Education;Work;Leisure;Social Participation    Body Structure / Function / Physical Skills ADL    Cognitive Skills Attention;Emotional;Energy/Drive;Learn;Perception;Problem Solve;Safety Awareness;Temperament/Personality;Thought;Understand;Memory    Psychosocial Skills Coping Strategies;Environmental  Adaptations;Habits;Interpersonal Interaction;Routines and Behaviors             Patient will benefit from  skilled therapeutic intervention in order to improve the following deficits and impairments:   Body Structure / Function / Physical Skills: ADL Cognitive Skills: Attention, Emotional, Energy/Drive, Learn, Perception, Problem Solve, Safety Awareness, Temperament/Personality, Thought, Understand, Memory Psychosocial Skills: Coping Strategies, Environmental  Adaptations, Habits, Interpersonal Interaction, Routines and Behaviors   Visit Diagnosis: Difficulty coping  Frontal lobe and executive function deficit  Severe episode of recurrent major depressive disorder, without psychotic features [facility])    Problem List Patient Active Problem List   Diagnosis Date Noted   Bipolar 1 disorder, mixed, severe ([place]) 07/09/2021   Mood disorder ([place]) 07/09/2021   Severe episode of recurrent major depressive disorder, without psychotic features ([place]) 07/09/2021   Mass of left side of neck 06/12/2021   Abnormal urine color 06/12/2021   Left elbow pain 10/04/2020   Numbness and tingling of right arm 06/28/2020   Metatarsalgia of right foot 01/10/2020   Choking 11/17/2019  Dysphagia 11/17/2019   Patellofemoral pain syndrome of left knee 10/27/2019   Current smoker 10/22/2019   Anxiety 10/22/2019   Supraspinatus tendon tear 09/13/2019   Chronic left-sided low back pain with left-sided sciatica 06/02/2019   Impulsiveness 02/15/2019   Inattention 02/15/2019   Dry eye of right side 01/18/2019   Pterygium of right eye 01/18/2019   Acute recurrent pansinusitis 01/18/2019   Cramping of feet 01/07/2019   Paresthesia 01/07/2019   Lumbar facet arthropathy 12/17/2018   Acute stress disorder 09/03/2018   Trouble in sleeping 09/03/2018   Fatigue 09/03/2018   Chronic glomerulonephritis 08/11/2018   Elevated fasting glucose 05/25/2018   Hypertriglyceridemia 05/25/2018   Binge-eating disorder, mild 05/20/2018   Irritability and anger 02/23/2018   Mood changes 12/24/2017   Irritable 12/24/2017   Vaginal  discharge 11/04/2017   Vaginal irritation 11/04/2017   Lower abdominal pain 11/04/2017   Severe anxiety 10/14/2017   Stress reaction 10/12/2017   Frontal headache 09/16/2017   MDD (major depressive disorder), recurrent episode, mild ([place]) 09/03/2017   Insulin resistance 07/30/2017   Epigastric abdominal pain 04/17/2017   Constipation 04/17/2017   Hematuria 04/17/2017   Chronic sinusitis 04/07/2017   Leiomyoma of uterus 02/26/2017   Renal cyst, right 02/26/2017   Sinus tachycardia 02/06/2017   Palpitations 01/29/2017   PAC (premature atrial contraction) 01/29/2017   Vitamin D deficiency 12/23/2016   B12 deficiency 12/23/2016   Dysmenorrhea 12/19/2016   Bilateral cold feet 12/19/2016   New daily persistent headache 12/19/2016   GAD (generalized anxiety disorder) 12/19/2016   Tobacco dependence 12/19/2016   Tachycardia, paroxysmal ([place]) 03/07/2016   Microscopic hematuria 02/29/2016   Allergy to influenza vaccine 10/02/2015   Class 1 obesity due to excess calories without serious comorbidity with body mass index (BMI) of 30.0 to 30.9 in adult 11/25/2014   Neck strain 11/04/2014   Acute recurrent frontal sinusitis 10/19/2013   Sinus tarsi syndrome of left ankle 07/16/2013    07/26/2021  Kelsey Vaughan, MOT, OTR/L  07/26/2021, 1:47 PM  [facility] PARTIAL HOSPITALIZATION PROGRAM [place], [place], [address] Phone: [phone]   Fax:  [phone]  Name: Alyiana Gerke MRN: [id number] Date of Birth: 30-Aug-1976

## 2021-07-27 ENCOUNTER — Encounter (HOSPITAL_COMMUNITY): Payer: Self-pay

## 2021-07-27 ENCOUNTER — Other Ambulatory Visit (HOSPITAL_COMMUNITY): Payer: BLUE CROSS/BLUE SHIELD | Admitting: Occupational Therapy

## 2021-07-27 ENCOUNTER — Other Ambulatory Visit: Payer: Self-pay

## 2021-07-27 ENCOUNTER — Other Ambulatory Visit (HOSPITAL_COMMUNITY): Payer: BLUE CROSS/BLUE SHIELD | Admitting: Licensed Clinical Social Worker

## 2021-07-27 DIAGNOSIS — F332 Major depressive disorder, recurrent severe without psychotic features: Secondary | ICD-10-CM

## 2021-07-27 DIAGNOSIS — R4589 Other symptoms and signs involving emotional state: Secondary | ICD-10-CM

## 2021-07-27 DIAGNOSIS — R41844 Frontal lobe and executive function deficit: Secondary | ICD-10-CM

## 2021-07-27 NOTE — Therapy (Signed)
[place], [place], [address] Phone: [phone]   Fax:  [phone] Virtual Visit via Video Note  I connected with Kelsey Vaughan on 07/27/21 at  12:00 PM EDT by a video enabled telemedicine application and verified that I am speaking with the correct person using two identifiers.  Location: Patient: Patient Home Provider: Clinic Office   I discussed the limitations of evaluation and management by telemedicine and the availability of in person appointments. The patient expressed understanding and agreed to proceed.   I discussed the assessment and treatment plan with the patient. The patient was provided an opportunity to ask questions and all were answered. The patient agreed with the plan and demonstrated an understanding of the instructions.   The patient was advised to call back or seek an in-person evaluation if the symptoms worsen or if the condition fails to improve as anticipated.  I provided 50 minutes of non-face-to-face time during this encounter.   Ponciano Ort, OT   Occupational Therapy Treatment  Patient Details  Name: Kelsey Vaughan MRN: [id number] Date of Birth: [birth year] No data recorded  Encounter Date: 07/27/2021   OT End of Session - 07/27/21 1446     Visit Number 7    Number of Visits 20    Date for OT Re-Evaluation 08/14/21    Authorization Type BCBS    OT Start Time 1200    OT Stop Time 1250    OT Time Calculation (min) 50 min    Activity Tolerance Patient tolerated treatment well    Behavior During Therapy [facility] for tasks assessed/performed             Past Medical History:  Diagnosis Date   Anxiety    Atrial fibrillation ([place])    a. occuring in 02/2016   B12 deficiency    Complication of anesthesia    Constipation    Foreign body in foot, left 07/2013   GERD (gastroesophageal reflux disease)    Hematuria    Irregular menses    due to oral  contraceptive   Palpitations    a. 01/2017: Event monitor showing SR with PVC's.    PONV (postoperative nausea and vomiting)    Sinus tachycardia    Thyroid nodule    Vitamin D deficiency     Past Surgical History:  Procedure Laterality Date   CESAREAN SECTION     FOREIGN BODY REMOVAL Left 08/05/2013   Procedure: LEFT FOOT FOREIGN BODY REMOVAL ADULT;  Surgeon: Wylene Simmer, MD;  Location: [facility];  Service: Orthopedics;  Laterality: Left;   SHOULDER ARTHROSCOPY Left    TUBAL LIGATION      There were no vitals filed for this visit.   Subjective Assessment - 07/27/21 1446     Currently in Pain? No/denies                                  OT Education - 07/27/21 1446     Education Details Educated on different communication styles with strategies to become more assertive with use of XYZ communication tool    Person(s) Educated Patient    Methods Explanation;Handout    Comprehension Verbalized understanding              OT Short Term Goals - 07/18/21 1416       OT SHORT TERM GOAL #1   Title Pt will  actively engage in OT group sessions throughout duration of PHP programming, in order to promote daily structure, social engagement, and opportunities to develop and utilize adaptive strategies to maximize functional performance in preparation for safe transition and integration back into school, work, and the community.    Status On-going      OT SHORT TERM GOAL #2   Title Pt will demonstrate improved ability to communicate feelings/needs/wants, without being angry/irritable/aggressive, as evidenced by, active participation in OT sessions, throughout duration of PHP programming, in order to safely transition back into the community at discharge.    Status On-going      OT SHORT TERM GOAL #3   Title Pt will identify and implement 1-3 sleep hygiene strategies she can utilize, in order to improve sleep quality/ADL performance, in  preparation for safe and healthy reintegration back into the community at discharge.    Status On-going      OT SHORT TERM GOAL #4   Title Pt will identify 1-3 stress management strategies she can utilize, in order to safely manage increased psychosocial stressors identified, with min cues, in preparation for safe transition back to the community at discharge.    Status On-going           Group Session:  S: None noted  O: Group began with a reflection from previous OT session focused on communication styles and group members re-iterated what was learned during previous session. Members shared and reflected on any opportunities they were presented with last evening to practice their assertiveness skills or recognize patterns of communication observed. Today's group focused on assertiveness skills training and use of the XYZ* assertive communication tool was introduced. The XYZ communication tool states: I feel X when you do Y in situation Z and I would like _________. X is the emotion, Y is the specific behavior, and Z is the specific situation. Group members each formulated their own XYZ statement and shared with the group to discuss and offer feedback. Additional tips and strategies to practice being assertive were also introduced and discussed.   A: Kelsey Vaughan was minimally engaged in her participation of discussion and activity, despite encouragement and max verbal cues from this writer to engage. Pt did not respond when prompted, had camera turned off, and did not share in her responses. Will bring to treatment team next week and continue to encourage engagement and participation in future sessions.    P: Continue to attend PHP OT group sessions 5x week for 2 weeks to promote daily structure, social engagement, and opportunities to develop and utilize adaptive strategies to maximize functional performance in preparation for safe transition and integration back into school, work, and the community.  Plan to address topic of fair fighting in next OT group session.  Plan - 07/27/21 1447     Occupational performance deficits (Please refer to evaluation for details): ADL's;IADL's;Rest and Sleep;Education;Work;Leisure;Social Participation    Body Structure / Function / Physical Skills ADL    Cognitive Skills Attention;Emotional;Energy/Drive;Learn;Perception;Problem Solve;Safety Awareness;Temperament/Personality;Thought;Understand;Memory    Psychosocial Skills Coping Strategies;Environmental  Adaptations;Habits;Interpersonal Interaction;Routines and Behaviors             Patient will benefit from skilled therapeutic intervention in order to improve the following deficits and impairments:   Body Structure / Function / Physical Skills: ADL Cognitive Skills: Attention, Emotional, Energy/Drive, Learn, Perception, Problem Solve, Safety Awareness, Temperament/Personality, Thought, Understand, Memory Psychosocial Skills: Coping Strategies, Environmental  Adaptations, Habits, Interpersonal Interaction, Routines and Behaviors   Visit Diagnosis: Difficulty coping  Frontal  lobe and executive function deficit  Severe episode of recurrent major depressive disorder, without psychotic features [facility])    Problem List Patient Active Problem List   Diagnosis Date Noted   Bipolar 1 disorder, mixed, severe ([place]) 07/09/2021   Mood disorder ([place]) 07/09/2021   Severe episode of recurrent major depressive disorder, without psychotic features ([place]) 07/09/2021   Mass of left side of neck 06/12/2021   Abnormal urine color 06/12/2021   Left elbow pain 10/04/2020   Numbness and tingling of right arm 06/28/2020   Metatarsalgia of right foot 01/10/2020   Choking 11/17/2019   Dysphagia 11/17/2019   Patellofemoral pain syndrome of left knee 10/27/2019   Current smoker 10/22/2019   Anxiety 10/22/2019   Supraspinatus tendon tear 09/13/2019   Chronic left-sided low back pain with left-sided sciatica  06/02/2019   Impulsiveness 02/15/2019   Inattention 02/15/2019   Dry eye of right side 01/18/2019   Pterygium of right eye 01/18/2019   Acute recurrent pansinusitis 01/18/2019   Cramping of feet 01/07/2019   Paresthesia 01/07/2019   Lumbar facet arthropathy 12/17/2018   Acute stress disorder 09/03/2018   Trouble in sleeping 09/03/2018   Fatigue 09/03/2018   Chronic glomerulonephritis 08/11/2018   Elevated fasting glucose 05/25/2018   Hypertriglyceridemia 05/25/2018   Binge-eating disorder, mild 05/20/2018   Irritability and anger 02/23/2018   Mood changes 12/24/2017   Irritable 12/24/2017   Vaginal discharge 11/04/2017   Vaginal irritation 11/04/2017   Lower abdominal pain 11/04/2017   Severe anxiety 10/14/2017   Stress reaction 10/12/2017   Frontal headache 09/16/2017   MDD (major depressive disorder), recurrent episode, mild ([place]) 09/03/2017   Insulin resistance 07/30/2017   Epigastric abdominal pain 04/17/2017   Constipation 04/17/2017   Hematuria 04/17/2017   Chronic sinusitis 04/07/2017   Leiomyoma of uterus 02/26/2017   Renal cyst, right 02/26/2017   Sinus tachycardia 02/06/2017   Palpitations 01/29/2017   PAC (premature atrial contraction) 01/29/2017   Vitamin D deficiency 12/23/2016   B12 deficiency 12/23/2016   Dysmenorrhea 12/19/2016   Bilateral cold feet 12/19/2016   New daily persistent headache 12/19/2016   GAD (generalized anxiety disorder) 12/19/2016   Tobacco dependence 12/19/2016   Tachycardia, paroxysmal ([place]) 03/07/2016   Microscopic hematuria 02/29/2016   Allergy to influenza vaccine 10/02/2015   Class 1 obesity due to excess calories without serious comorbidity with body mass index (BMI) of 30.0 to 30.9 in adult 11/25/2014   Neck strain 11/04/2014   Acute recurrent frontal sinusitis 10/19/2013   Sinus tarsi syndrome of left ankle 07/16/2013    07/27/2021  Ponciano Ort, MOT, OTR/L  07/27/2021, 2:47 PM  [facility] [place], [place], [address] Phone: [phone]   Fax:  [phone]  Name: Kelsey Vaughan MRN: [id number] Date of Birth: 12/25/1975

## 2021-07-30 ENCOUNTER — Other Ambulatory Visit (HOSPITAL_COMMUNITY): Payer: BLUE CROSS/BLUE SHIELD

## 2021-07-30 ENCOUNTER — Other Ambulatory Visit: Payer: Self-pay

## 2021-07-30 ENCOUNTER — Other Ambulatory Visit (HOSPITAL_COMMUNITY): Payer: BLUE CROSS/BLUE SHIELD | Admitting: Licensed Clinical Social Worker

## 2021-07-30 ENCOUNTER — Other Ambulatory Visit (HOSPITAL_BASED_OUTPATIENT_CLINIC_OR_DEPARTMENT_OTHER): Payer: Self-pay

## 2021-07-30 DIAGNOSIS — F332 Major depressive disorder, recurrent severe without psychotic features: Secondary | ICD-10-CM

## 2021-07-31 ENCOUNTER — Other Ambulatory Visit (HOSPITAL_COMMUNITY): Payer: BLUE CROSS/BLUE SHIELD | Admitting: Licensed Clinical Social Worker

## 2021-07-31 ENCOUNTER — Other Ambulatory Visit (HOSPITAL_BASED_OUTPATIENT_CLINIC_OR_DEPARTMENT_OTHER): Payer: Self-pay

## 2021-07-31 ENCOUNTER — Encounter (HOSPITAL_COMMUNITY): Payer: Self-pay

## 2021-07-31 ENCOUNTER — Other Ambulatory Visit: Payer: Self-pay

## 2021-07-31 ENCOUNTER — Other Ambulatory Visit (HOSPITAL_COMMUNITY): Payer: BLUE CROSS/BLUE SHIELD | Admitting: Occupational Therapy

## 2021-07-31 DIAGNOSIS — R4589 Other symptoms and signs involving emotional state: Secondary | ICD-10-CM

## 2021-07-31 DIAGNOSIS — F332 Major depressive disorder, recurrent severe without psychotic features: Secondary | ICD-10-CM

## 2021-07-31 DIAGNOSIS — R41844 Frontal lobe and executive function deficit: Secondary | ICD-10-CM

## 2021-07-31 NOTE — Therapy (Signed)
[facility] [place], [place], [address] Phone: [phone]   Fax:  [phone] Virtual Visit via Video Note  I connected with Kelsey Vaughan on 07/31/21 at  12:00 PM EDT by a video enabled telemedicine application and verified that I am speaking with the correct person using two identifiers.  Location: Patient: Patient Home Provider: Clinic Office   I discussed the limitations of evaluation and management by telemedicine and the availability of in person appointments. The patient expressed understanding and agreed to proceed.   I discussed the assessment and treatment plan with the patient. The patient was provided an opportunity to ask questions and all were answered. The patient agreed with the plan and demonstrated an understanding of the instructions.   The patient was advised to call back or seek an in-person evaluation if the symptoms worsen or if the condition fails to improve as anticipated.  I provided 50 minutes of non-face-to-face time during this encounter.   Ponciano Ort, OT   Occupational Therapy Treatment  Patient Details  Name: Kelsey Vaughan MRN: [id number] Date of Birth: 1976/09/11 No data recorded  Encounter Date: 07/31/2021   OT End of Session - 07/31/21 1457     Visit Number 8    Number of Visits 20    Date for OT Re-Evaluation 08/14/21    Authorization Type BCBS    OT Start Time 1200    OT Stop Time 1250    OT Time Calculation (min) 50 min    Activity Tolerance Patient tolerated treatment well    Behavior During Therapy [facility] for tasks assessed/performed             Past Medical History:  Diagnosis Date   Anxiety    Atrial fibrillation ([place])    a. occuring in 02/2016   B12 deficiency    Complication of anesthesia    Constipation    Foreign body in foot, left 07/2013   GERD (gastroesophageal reflux disease)    Hematuria    Irregular menses    due to oral  contraceptive   Palpitations    a. 01/2017: Event monitor showing SR with PVC's.    PONV (postoperative nausea and vomiting)    Sinus tachycardia    Thyroid nodule    Vitamin D deficiency     Past Surgical History:  Procedure Laterality Date   CESAREAN SECTION     FOREIGN BODY REMOVAL Left 08/05/2013   Procedure: LEFT FOOT FOREIGN BODY REMOVAL ADULT;  Surgeon: Wylene Simmer, MD;  Location: [facility];  Service: Orthopedics;  Laterality: Left;   SHOULDER ARTHROSCOPY Left    TUBAL LIGATION      There were no vitals filed for this visit.   Subjective Assessment - 07/31/21 1456     Currently in Pain? No/denies                                  OT Education - 07/31/21 1456     Education Details Educated on low vs positive self-esteem and reviewed six strategies to boost low self-esteem    Person(s) Educated Patient    Methods Explanation;Handout    Comprehension Verbalized understanding              OT Short Term Goals - 07/18/21 1416       OT SHORT TERM GOAL #1   Title Pt will actively engage in  OT group sessions throughout duration of PHP programming, in order to promote daily structure, social engagement, and opportunities to develop and utilize adaptive strategies to maximize functional performance in preparation for safe transition and integration back into school, work, and the community.    Status On-going      OT SHORT TERM GOAL #2   Title Pt will demonstrate improved ability to communicate feelings/needs/wants, without being angry/irritable/aggressive, as evidenced by, active participation in OT sessions, throughout duration of PHP programming, in order to safely transition back into the community at discharge.    Status On-going      OT SHORT TERM GOAL #3   Title Pt will identify and implement 1-3 sleep hygiene strategies she can utilize, in order to improve sleep quality/ADL performance, in preparation for safe and healthy  reintegration back into the community at discharge.    Status On-going      OT SHORT TERM GOAL #4   Title Pt will identify 1-3 stress management strategies she can utilize, in order to safely manage increased psychosocial stressors identified, with min cues, in preparation for safe transition back to the community at discharge.    Status On-going           Group Session:  S: "I am in charge of my own life and no one else is."  O: Today's group session encouraged increased engagement and participation through discussion focused on self-esteem. Topic of discussion focused on identifying differences between low vs high self-esteem and identified ways in which our self-esteem impacts our daily lives including: self-criticism, ignoring positive qualities, negative emotions, work/school performance, relationships, leisure engagement, and self-care. Group members further identified ways in which their self-esteem directly impacts their daily function. Tips and strategies were shared to boost and build-up our low self-esteem, including exercise, mindfulness/meditation, postures of confidence, surrounding yourself with positive people, positive affirmations, and self-care. Group members committed to trying one of the above-mentioned strategies over their weekend.    A: Kelsey Vaughan was active and independent in her participation of discussion and shared that she has low self-esteem. Pt receptive to strategies reviewed and shared a positive affirmation that stood out to her "I am in charge of my life." Pt also shared three positive ways to describe herself "hardworking, loving, and a go-getter."  P: Continue to attend PHP OT group sessions 5x week for 2 weeks to promote daily structure, social engagement, and opportunities to develop and utilize adaptive strategies to maximize functional performance in preparation for safe transition and integration back into school, work, and the community. Plan to address topic  of safety planning in next OT group session.   Plan - 07/31/21 1457     Occupational performance deficits (Please refer to evaluation for details): ADL's;IADL's;Rest and Sleep;Education;Work;Leisure;Social Participation    Body Structure / Function / Physical Skills ADL    Cognitive Skills Attention;Emotional;Energy/Drive;Learn;Perception;Problem Solve;Safety Awareness;Temperament/Personality;Thought;Understand;Memory    Psychosocial Skills Coping Strategies;Environmental  Adaptations;Habits;Interpersonal Interaction;Routines and Behaviors             Patient will benefit from skilled therapeutic intervention in order to improve the following deficits and impairments:   Body Structure / Function / Physical Skills: ADL Cognitive Skills: Attention, Emotional, Energy/Drive, Learn, Perception, Problem Solve, Safety Awareness, Temperament/Personality, Thought, Understand, Memory Psychosocial Skills: Coping Strategies, Environmental  Adaptations, Habits, Interpersonal Interaction, Routines and Behaviors   Visit Diagnosis: Difficulty coping  Frontal lobe and executive function deficit  Severe episode of recurrent major depressive disorder, without psychotic features ([place])    Problem  List Patient Active Problem List   Diagnosis Date Noted   Bipolar 1 disorder, mixed, severe ([place]) 07/09/2021   Mood disorder ([place]) 07/09/2021   Severe episode of recurrent major depressive disorder, without psychotic features ([place]) 07/09/2021   Mass of left side of neck 06/12/2021   Abnormal urine color 06/12/2021   Left elbow pain 10/04/2020   Numbness and tingling of right arm 06/28/2020   Metatarsalgia of right foot 01/10/2020   Choking 11/17/2019   Dysphagia 11/17/2019   Patellofemoral pain syndrome of left knee 10/27/2019   Current smoker 10/22/2019   Anxiety 10/22/2019   Supraspinatus tendon tear 09/13/2019   Chronic left-sided low back pain with left-sided sciatica 06/02/2019    Impulsiveness 02/15/2019   Inattention 02/15/2019   Dry eye of right side 01/18/2019   Pterygium of right eye 01/18/2019   Acute recurrent pansinusitis 01/18/2019   Cramping of feet 01/07/2019   Paresthesia 01/07/2019   Lumbar facet arthropathy 12/17/2018   Acute stress disorder 09/03/2018   Trouble in sleeping 09/03/2018   Fatigue 09/03/2018   Chronic glomerulonephritis 08/11/2018   Elevated fasting glucose 05/25/2018   Hypertriglyceridemia 05/25/2018   Binge-eating disorder, mild 05/20/2018   Irritability and anger 02/23/2018   Mood changes 12/24/2017   Irritable 12/24/2017   Vaginal discharge 11/04/2017   Vaginal irritation 11/04/2017   Lower abdominal pain 11/04/2017   Severe anxiety 10/14/2017   Stress reaction 10/12/2017   Frontal headache 09/16/2017   MDD (major depressive disorder), recurrent episode, mild ([place]) 09/03/2017   Insulin resistance 07/30/2017   Epigastric abdominal pain 04/17/2017   Constipation 04/17/2017   Hematuria 04/17/2017   Chronic sinusitis 04/07/2017   Leiomyoma of uterus 02/26/2017   Renal cyst, right 02/26/2017   Sinus tachycardia 02/06/2017   Palpitations 01/29/2017   PAC (premature atrial contraction) 01/29/2017   Vitamin D deficiency 12/23/2016   B12 deficiency 12/23/2016   Dysmenorrhea 12/19/2016   Bilateral cold feet 12/19/2016   New daily persistent headache 12/19/2016   GAD (generalized anxiety disorder) 12/19/2016   Tobacco dependence 12/19/2016   Tachycardia, paroxysmal ([place]) 03/07/2016   Microscopic hematuria 02/29/2016   Allergy to influenza vaccine 10/02/2015   Class 1 obesity due to excess calories without serious comorbidity with body mass index (BMI) of 30.0 to 30.9 in adult 11/25/2014   Neck strain 11/04/2014   Acute recurrent frontal sinusitis 10/19/2013   Sinus tarsi syndrome of left ankle 07/16/2013    07/31/2021  Ponciano Ort, MOT, OTR/L  07/31/2021, 2:58 PM  [facility] PARTIAL  HOSPITALIZATION PROGRAM [place], [place], [address] Phone: [phone]   Fax:  [phone]  Name: Kelsey Vaughan MRN: [id number] Date of Birth: Jun 01, 1976

## 2021-07-31 NOTE — Progress Notes (Signed)
Spoke with patient via Webex video call, used 2 identifiers to correctly identify patient. States that she is feeling frustrated and anxious today because her phone is giving her technical issues. She can't be heard when speaking and her phone will just shut off when a call comes through. Enjoying groups and no issues or complaints. No side effects from medications. Denies SI/HI or AV hallucinations. On scale 1-10 as 10 being worst she rates depression at 7 and anxiety at 10 due to the phone issue today. No other issues.

## 2021-08-01 ENCOUNTER — Other Ambulatory Visit (HOSPITAL_BASED_OUTPATIENT_CLINIC_OR_DEPARTMENT_OTHER): Payer: Self-pay

## 2021-08-01 ENCOUNTER — Other Ambulatory Visit (HOSPITAL_COMMUNITY): Payer: BLUE CROSS/BLUE SHIELD | Admitting: Licensed Clinical Social Worker

## 2021-08-01 ENCOUNTER — Other Ambulatory Visit: Payer: Self-pay

## 2021-08-01 ENCOUNTER — Encounter (HOSPITAL_COMMUNITY): Payer: Self-pay

## 2021-08-01 ENCOUNTER — Other Ambulatory Visit (HOSPITAL_COMMUNITY): Payer: BLUE CROSS/BLUE SHIELD | Admitting: Occupational Therapy

## 2021-08-01 DIAGNOSIS — R41844 Frontal lobe and executive function deficit: Secondary | ICD-10-CM

## 2021-08-01 DIAGNOSIS — F332 Major depressive disorder, recurrent severe without psychotic features: Secondary | ICD-10-CM

## 2021-08-01 DIAGNOSIS — R4589 Other symptoms and signs involving emotional state: Secondary | ICD-10-CM

## 2021-08-01 NOTE — Therapy (Addendum)
[facility] [address] [place], [place], [address] Phone: [phone]   Fax:  [phone] Virtual Visit via Video Note  I connected with Kelsey Vaughan on 08/01/21 at  12:00 PM EDT by a video enabled telemedicine application and verified that I am speaking with the correct person using two identifiers.  Location: Patient: Patient Home Provider: Clinic Office   I discussed the limitations of evaluation and management by telemedicine and the availability of in person appointments. The patient expressed understanding and agreed to proceed.    I discussed the assessment and treatment plan with the patient. The patient was provided an opportunity to ask questions and all were answered. The patient agreed with the plan and demonstrated an understanding of the instructions.   The patient was advised to call back or seek an in-person evaluation if the symptoms worsen or if the condition fails to improve as anticipated.  I provided 45 minutes of non-face-to-face time during this encounter.    , OT   Occupational Therapy Treatment  Patient Details  Name: Kelsey Vaughan MRN: [id number] Date of Birth: 06/03/1976 No data recorded  Encounter Date: 08/01/2021   OT End of Session - 08/01/21 1419     Visit Number 9    Number of Visits 20    Date for OT Re-Evaluation 08/14/21    Authorization Type [employer]    OT Start Time 1205    OT Stop Time 1250    OT Time Calculation (min) 45 min    Activity Tolerance Patient tolerated treatment well    Behavior During Therapy WFL for tasks assessed/performed             Past Medical History:  Diagnosis Date   Anxiety    Atrial fibrillation (HCC)    a. occuring in 02/2016   B12 deficiency    Complication of anesthesia    Constipation    Foreign body in foot, left 07/2013   GERD (gastroesophageal reflux disease)    Hematuria    Irregular menses    due to oral  contraceptive   Palpitations    a. 01/2017: Event monitor showing SR with PVC's.    PONV (postoperative nausea and vomiting)    Sinus tachycardia    Thyroid nodule    Vitamin D deficiency     Past Surgical History:  Procedure Laterality Date   CESAREAN SECTION     FOREIGN BODY REMOVAL Left 08/05/2013   Procedure: LEFT FOOT FOREIGN BODY REMOVAL ADULT;  Surgeon: John Hewitt, MD;  Location: [facility];  Service: Orthopedics;  Laterality: Left;   SHOULDER ARTHROSCOPY Left    TUBAL LIGATION      There were no vitals filed for this visit.   Subjective Assessment - 08/01/21 1418     Currently in Pain? No/denies                                  OT Education - 08/01/21 1419     Education Details Educated on identifying coping strategies, social supports, and community mental health resources available through use of safety planning tool    Person(s) Educated Patient    Methods Explanation;Handout    Comprehension Verbalized understanding              OT Short Term Goals - 07/18/21 1416       OT SHORT TERM GOAL #1     Title Pt will actively engage in OT group sessions throughout duration of PHP programming, in order to promote daily structure, social engagement, and opportunities to develop and utilize adaptive strategies to maximize functional performance in preparation for safe transition and integration back into school, work, and the community.    Status On-going      OT SHORT TERM GOAL #2   Title Pt will demonstrate improved ability to communicate feelings/needs/wants, without being angry/irritable/aggressive, as evidenced by, active participation in OT sessions, throughout duration of PHP programming, in order to safely transition back into the community at discharge.    Status On-going      OT SHORT TERM GOAL #3   Title Pt will identify and implement 1-3 sleep hygiene strategies she can utilize, in order to improve sleep quality/ADL  performance, in preparation for safe and healthy reintegration back into the community at discharge.    Status On-going      OT SHORT TERM GOAL #4   Title Pt will identify 1-3 stress management strategies she can utilize, in order to safely manage increased psychosocial stressors identified, with min cues, in preparation for safe transition back to the community at discharge.    Status On-going           Group Session:  S: "I get quiet and try to gather my thoughts."  O: Today's group discussion focused on the topic of Safety Planning. Patients were educated on what a safety plan is, what it can be used for, and why we should create one. Group then worked collaboratively and independently to create an individualized safety plan including identifying warning signs, coping strategies, places to go for distraction, social supports, professional supports, and how to make the environment safe. Group members were also encouraged to reflect on the question "What is life worth living for?" Group session ended with patients encouraged to utilize their safety plan as an all-inclusive resource when experiencing a mental health crisis.    A: Kelsey Vaughan was active in her participation of discussion and activity, sharing that she gets quiet, and sometimes shuts down, while trying to gather her thoughts. Pt identified benefit from distractions including listening to music and playing a game on her phone. Appeared receptive to further education on safety planning.   P: Continue to attend PHP OT group sessions 5x week for 2 weeks to promote daily structure, social engagement, and opportunities to develop and utilize adaptive strategies to maximize functional performance in preparation for safe transition and integration back into school, work, and the community.   OCCUPATIONAL THERAPY DISCHARGE SUMMARY  Visits from Start of Care: 9  Current functional level related to goals / functional outcomes: Kelsey Vaughan has  partially met all of her OT goals within the PHP program - pt continues to progress, however had difficulty engaging actively in all sessions. Pt is ready for discharge at this time, per patient request and is recommended to continue with OP PCP follow up for ongoing treatment and therapy needs.    Remaining deficits: See above   Education / Equipment: See above   Patient agrees to discharge. Patient goals were partially met. Patient is being discharged due to being pleased with the current functional level..     Plan - 08/01/21 1419     Occupational performance deficits (Please refer to evaluation for details): ADL's;IADL's;Rest and Sleep;Education;Work;Leisure;Social Participation    Body Structure / Function / Physical Skills ADL    Cognitive Skills Attention;Emotional;Energy/Drive;Learn;Perception;Problem Solve;Safety Awareness;Temperament/Personality;Thought;Understand;Memory    Psychosocial Skills Coping   Strategies;Environmental  Adaptations;Habits;Interpersonal Interaction;Routines and Behaviors             Patient will benefit from skilled therapeutic intervention in order to improve the following deficits and impairments:   Body Structure / Function / Physical Skills: ADL Cognitive Skills: Attention, Emotional, Energy/Drive, Learn, Perception, Problem Solve, Safety Awareness, Temperament/Personality, Thought, Understand, Memory Psychosocial Skills: Coping Strategies, Environmental  Adaptations, Habits, Interpersonal Interaction, Routines and Behaviors   Visit Diagnosis: Difficulty coping  Frontal lobe and executive function deficit  Severe episode of recurrent major depressive disorder, without psychotic features [facility])    Problem List Patient Active Problem List   Diagnosis Date Noted   Bipolar 1 disorder, mixed, severe ([place]) 07/09/2021   Mood disorder ([place]) 07/09/2021   Severe episode of recurrent major depressive disorder, without psychotic features ([place])  07/09/2021   Mass of left side of neck 06/12/2021   Abnormal urine color 06/12/2021   Left elbow pain 10/04/2020   Numbness and tingling of right arm 06/28/2020   Metatarsalgia of right foot 01/10/2020   Choking 11/17/2019   Dysphagia 11/17/2019   Patellofemoral pain syndrome of left knee 10/27/2019   Current smoker 10/22/2019   Anxiety 10/22/2019   Supraspinatus tendon tear 09/13/2019   Chronic left-sided low back pain with left-sided sciatica 06/02/2019   Impulsiveness 02/15/2019   Inattention 02/15/2019   Dry eye of right side 01/18/2019   Pterygium of right eye 01/18/2019   Acute recurrent pansinusitis 01/18/2019   Cramping of feet 01/07/2019   Paresthesia 01/07/2019   Lumbar facet arthropathy 12/17/2018   Acute stress disorder 09/03/2018   Trouble in sleeping 09/03/2018   Fatigue 09/03/2018   Chronic glomerulonephritis 08/11/2018   Elevated fasting glucose 05/25/2018   Hypertriglyceridemia 05/25/2018   Binge-eating disorder, mild 05/20/2018   Irritability and anger 02/23/2018   Mood changes 12/24/2017   Irritable 12/24/2017   Vaginal discharge 11/04/2017   Vaginal irritation 11/04/2017   Lower abdominal pain 11/04/2017   Severe anxiety 10/14/2017   Stress reaction 10/12/2017   Frontal headache 09/16/2017   MDD (major depressive disorder), recurrent episode, mild ([place]) 09/03/2017   Insulin resistance 07/30/2017   Epigastric abdominal pain 04/17/2017   Constipation 04/17/2017   Hematuria 04/17/2017   Chronic sinusitis 04/07/2017   Leiomyoma of uterus 02/26/2017   Renal cyst, right 02/26/2017   Sinus tachycardia 02/06/2017   Palpitations 01/29/2017   PAC (premature atrial contraction) 01/29/2017   Vitamin D deficiency 12/23/2016   B12 deficiency 12/23/2016   Dysmenorrhea 12/19/2016   Bilateral cold feet 12/19/2016   New daily persistent headache 12/19/2016   GAD (generalized anxiety disorder) 12/19/2016   Tobacco dependence 12/19/2016   Tachycardia, paroxysmal  ([place]) 03/07/2016   Microscopic hematuria 02/29/2016   Allergy to influenza vaccine 10/02/2015   Class 1 obesity due to excess calories without serious comorbidity with body mass index (BMI) of 30.0 to 30.9 in adult 11/25/2014   Neck strain 11/04/2014   Acute recurrent frontal sinusitis 10/19/2013   Sinus tarsi syndrome of left ankle 07/16/2013    08/01/2021  Ponciano Ort, MOT, OTR/L  08/01/2021, 2:20 PM  [facility] [place], [place], [address] Phone: [phone]   Fax:  [phone]  Name: Kelsey Vaughan MRN: [id number] Date of Birth: January 01, 1976

## 2021-08-02 ENCOUNTER — Other Ambulatory Visit: Payer: Self-pay

## 2021-08-02 ENCOUNTER — Other Ambulatory Visit (HOSPITAL_COMMUNITY): Payer: BLUE CROSS/BLUE SHIELD

## 2021-08-02 ENCOUNTER — Other Ambulatory Visit (HOSPITAL_COMMUNITY): Payer: BLUE CROSS/BLUE SHIELD | Admitting: Licensed Clinical Social Worker

## 2021-08-02 DIAGNOSIS — F332 Major depressive disorder, recurrent severe without psychotic features: Secondary | ICD-10-CM

## 2021-08-03 ENCOUNTER — Other Ambulatory Visit: Payer: Self-pay

## 2021-08-03 ENCOUNTER — Other Ambulatory Visit (HOSPITAL_BASED_OUTPATIENT_CLINIC_OR_DEPARTMENT_OTHER): Payer: Self-pay

## 2021-08-03 ENCOUNTER — Other Ambulatory Visit (HOSPITAL_COMMUNITY): Payer: BLUE CROSS/BLUE SHIELD | Admitting: Licensed Clinical Social Worker

## 2021-08-03 ENCOUNTER — Other Ambulatory Visit (HOSPITAL_COMMUNITY): Payer: BLUE CROSS/BLUE SHIELD

## 2021-08-03 DIAGNOSIS — F332 Major depressive disorder, recurrent severe without psychotic features: Secondary | ICD-10-CM

## 2021-08-06 ENCOUNTER — Ambulatory Visit (INDEPENDENT_AMBULATORY_CARE_PROVIDER_SITE_OTHER): Payer: BLUE CROSS/BLUE SHIELD | Admitting: Physician Assistant

## 2021-08-06 ENCOUNTER — Other Ambulatory Visit: Payer: Self-pay

## 2021-08-06 ENCOUNTER — Other Ambulatory Visit (HOSPITAL_COMMUNITY): Payer: BLUE CROSS/BLUE SHIELD | Admitting: Licensed Clinical Social Worker

## 2021-08-06 ENCOUNTER — Other Ambulatory Visit (HOSPITAL_COMMUNITY): Payer: BLUE CROSS/BLUE SHIELD

## 2021-08-06 ENCOUNTER — Other Ambulatory Visit (HOSPITAL_BASED_OUTPATIENT_CLINIC_OR_DEPARTMENT_OTHER): Payer: Self-pay

## 2021-08-06 ENCOUNTER — Encounter: Payer: Self-pay | Admitting: Physician Assistant

## 2021-08-06 VITALS — BP 128/83 | HR 99 | Wt 175.1 lb

## 2021-08-06 DIAGNOSIS — R4589 Other symptoms and signs involving emotional state: Secondary | ICD-10-CM | POA: Diagnosis not present

## 2021-08-06 DIAGNOSIS — F3163 Bipolar disorder, current episode mixed, severe, without psychotic features: Secondary | ICD-10-CM

## 2021-08-06 DIAGNOSIS — R41844 Frontal lobe and executive function deficit: Secondary | ICD-10-CM | POA: Diagnosis not present

## 2021-08-06 DIAGNOSIS — F332 Major depressive disorder, recurrent severe without psychotic features: Secondary | ICD-10-CM
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
## 2021-08-06 NOTE — Progress Notes (Signed)
f °

## 2021-08-07 ENCOUNTER — Encounter: Payer: Self-pay | Admitting: Physician Assistant

## 2021-08-07 ENCOUNTER — Other Ambulatory Visit (HOSPITAL_COMMUNITY): Payer: BLUE CROSS/BLUE SHIELD | Admitting: Licensed Clinical Social Worker

## 2021-08-07 ENCOUNTER — Other Ambulatory Visit (HOSPITAL_COMMUNITY): Payer: BLUE CROSS/BLUE SHIELD

## 2021-08-07 DIAGNOSIS — F331 Major depressive disorder, recurrent, moderate: Secondary | ICD-10-CM

## 2021-08-07 DIAGNOSIS — F332 Major depressive disorder, recurrent severe without psychotic features: Secondary | ICD-10-CM | POA: Diagnosis not present

## 2021-08-07 NOTE — Progress Notes (Signed)
Virtual Visit via Video Note  I connected with Kelsey Vaughan on 08/07/21 at  9:00 AM EDT by a video enabled telemedicine application and verified that I am speaking with the correct person using two identifiers.  At orientation to the [facility] program, Case Manager discussed the limitations of evaluation and management by telemedicine and the availability of in person appointments. The patient expressed understanding and agreed to proceed with virtual visits throughout the duration of the program.   Location:  Patient: Patient Home Provider: Home Office   History of Present Illness: MDD  Observations/Objective: Check In: Case Manager checked in with all participants to review discharge dates, insurance authorizations, work-related documents and needs from the treatment team regarding medications. Client stated needs and engaged in discussion.   Initial Therapeutic Activity: Counselor facilitated a check-in with group members to assess mood and current functioning. Client shared details of their mental health management since our last session, including challenges and successes. Counselor engaged group in discussion, covering the following topics: [employer], [place], boundaries, assertive communication, relational dynamics and traumas and self-care. Client presents with severe depression and severe anxiety. Client denied any current SI/HI/psychosis.  Second Therapeutic Activity: Counselor introduced [employer], [place] Chaplain to present information and discussion on Grief and Loss. Group members engaged in discussion, sharing how grief impacts them, what comforts them, what emotions are felt, labeling losses, etc. After guest speaker logged off, Counselor prompted group to spend 10-15 minutes journaling to process personal grief and loss situations. Counselor processed entries with group and client's identified areas for additional processing in individual therapy and local support  groups. Client engaged well in discussion.  Check Out: Counselor prompted group members to identify one self-care practice or productivity activity they would like to engage in this today. Counselor encouraged Client to be kind and compassionate with self, as grief and loss usually will bring up residual thoughts and feelings. Client endorsed safety plan to be followed to prevent safety issues.  Assessment and Plan: Clinician recommends that Client remain in PHP treatment to better manage mental health symptoms, stabilization and to address treatment plan goals. Clinician recommends adherence to crisis/safety plan, taking medications as prescribed, and following up with medical professionals if any issues arise.    Follow Up Instructions: Clinician will send Webex link for next session. The Client was advised to call back or seek an in-person evaluation if the symptoms worsen or if the condition fails to improve as anticipated.     I provided 180 minutes of non-face-to-face time during this encounter.     Lise Auer, LCSW

## 2021-08-08 ENCOUNTER — Telehealth (HOSPITAL_COMMUNITY): Payer: Self-pay | Admitting: Licensed Clinical Social Worker

## 2021-08-08 ENCOUNTER — Other Ambulatory Visit (HOSPITAL_COMMUNITY): Payer: BLUE CROSS/BLUE SHIELD

## 2021-08-08 ENCOUNTER — Other Ambulatory Visit (HOSPITAL_COMMUNITY): Payer: BLUE CROSS/BLUE SHIELD | Admitting: Family

## 2021-08-08 ENCOUNTER — Encounter (HOSPITAL_COMMUNITY): Payer: Self-pay

## 2021-08-08 ENCOUNTER — Other Ambulatory Visit: Payer: Self-pay

## 2021-08-08 DIAGNOSIS — F331 Major depressive disorder, recurrent, moderate: Secondary | ICD-10-CM

## 2021-08-08 NOTE — Telephone Encounter (Signed)
[name] spoke with pt who states she had an issue with her license and she had to drive to [place] today to manage it. Pt states she is trying to stay positive about the whole situation. [name] reminds pt today was her scheduled discharge from [facility] and asked if pt wanted to continue with discharge today or would like to come in tomorrow for discharge. Pt states she is prepared to discharge today.

## 2021-08-09 ENCOUNTER — Ambulatory Visit (HOSPITAL_COMMUNITY): Payer: BLUE CROSS/BLUE SHIELD

## 2021-08-09 ENCOUNTER — Other Ambulatory Visit (HOSPITAL_COMMUNITY): Payer: BLUE CROSS/BLUE SHIELD

## 2021-08-09 ENCOUNTER — Encounter (HOSPITAL_COMMUNITY): Payer: Self-pay | Admitting: Family

## 2021-08-09 NOTE — Progress Notes (Unsigned)
  [facility] Partial Hospitalization  Outpatient Program Discharge Summary  Kelsey Vaughan [id number]  Admission date: 07/17/2021 Discharge date: 08/08/2021  Reason for admission: Per admission assessment note: Patient is a 45 y.o. African American female presents with worsening depression and passive thought of death.  Reported sense of passing of her father when she was 70 year old.  she reported she has unresolved grief, loss and trauma associated with his death.  States she was very close to her father and has been unable to forget how he died. States her grandchild passed away and continues to bring back memories of her father death. Patient was initially evaluated at [facility] urgent care  on 06/17/2021 where she reports "while she was working last night she had a thought " shoot herself with a gun."     Progress in Program Toward Treatment Goals: Ongoing, was reported patient attended and participated sporadically.  Previous checking she continued to deny suicidal or homicidal ideations.  Denies auditory visual hallucinations.  Patient was unable to attend group settings last day.  Patient to keep additional outpatient follow-up appointments.  Patient declined intensive outpatient programming at that time.  Progress (rationale): Keep all appointments with outpatient providers.  Staff to provide additional outpatient resources for [employer] and/or [place].  Consider hospice care for additional grief and loss support groups.   Take all medications as prescribed. Keep all follow-up appointments as scheduled.  Do not consume alcohol or use illegal drugs while on prescription medications. Report any adverse effects from your medications to your primary care provider promptly.  In the event of recurrent symptoms or worsening symptoms, call 911, a crisis hotline, or go to the nearest emergency department for evaluation.    Derrill Center,  NP 08/09/2021

## 2021-08-10 ENCOUNTER — Ambulatory Visit (HOSPITAL_COMMUNITY): Payer: BLUE CROSS/BLUE SHIELD

## 2021-08-10 ENCOUNTER — Other Ambulatory Visit (HOSPITAL_COMMUNITY): Payer: BLUE CROSS/BLUE SHIELD

## 2021-08-16 ENCOUNTER — Telehealth: Payer: Self-pay

## 2021-08-16 NOTE — Telephone Encounter (Signed)
Call to pt several times for virtual previsit.  Phone goes directly to vm and then states vm full, unable to leave message.

## 2021-08-27 ENCOUNTER — Telehealth: Payer: Self-pay

## 2021-08-27 NOTE — Telephone Encounter (Signed)
Medication: cariprazine (VRAYLAR) 1.5 MG capsule Prior authorization submitted via CoverMyMeds on 08/27/2021 PA submission pending Pt aware via MyChart

## 2021-08-30 ENCOUNTER — Encounter: Payer: BLUE CROSS/BLUE SHIELD | Admitting: Gastroenterology

## 2021-09-05 ENCOUNTER — Ambulatory Visit: Payer: BLUE CROSS/BLUE SHIELD | Admitting: Physician Assistant
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
## 2021-09-05 NOTE — Psych (Signed)
Virtual Visit via Video Note  I connected with Kelsey Vaughan on 07/19/21 at  9:00 AM EDT by a video enabled telemedicine application and verified that I am speaking with the correct person using two identifiers.  Location: Patient: patient home Provider: clinical home office   I discussed the limitations of evaluation and management by telemedicine and the availability of in person appointments. The patient expressed understanding and agreed to proceed.  I discussed the assessment and treatment plan with the patient. The patient was provided an opportunity to ask questions and all were answered. The patient agreed with the plan and demonstrated an understanding of the instructions.   The patient was advised to call back or seek an in-person evaluation if the symptoms worsen or if the condition fails to improve as anticipated.  Pt was provided 240 minutes of non-face-to-face time during this encounter.   Lorin Glass, LCSW    [facility] PHP THERAPIST PROGRESS NOTE  Kelsey Vaughan [id number]  Session Time: 9:00 - 10:00  Participation Level: Active  Behavioral Response: CasualAlertDepressed  Type of Therapy: Group Therapy  Treatment Goals addressed: Coping  Interventions: CBT, DBT, Supportive, and Reframing  Summary: Clinician led check-in regarding current stressors and situation. Clinician utilized active listening and empathetic response and validated patient emotions. Clinician facilitated processing group on pertinent issues.   Therapist Response: Shevon Sian is a 45 y.o. female who presents with depression symptoms. Patient arrived within time allowed and reports that she is feeling "scattered." Patient rates her mood at a 8 on a scale of 1-10 with 10 being great. Pt reports she dealt with a headache yesterday that altered her afternoon plans. Pt states struggling with not feeling productive. Pt able to process. Pt engaged in discussion.         Session Time:  10:00 - 11:00   Participation Level: Active   Behavioral Response: CasualAlertDepressed   Type of Therapy: Group Therapy   Treatment Goals addressed: Coping   Interventions: CBT, DBT, Supportive and Reframing   Summary: [name] continued topic of DBT distress tolerance skills. [name] introduced TIPP skills to use during cases of extreme emotion. Group practiced deep breathing and progressive muscle relaxation together. Group discussed which situations they can apply TIPP skills.      Therapist Response: Pt engaged in discussion reports most likely to utilize paced breathing.          Session Time: 11:00- 12:00   Participation Level: Active   Behavioral Response: CasualAlertDepressed   Type of Therapy: Group Therapy, OT   Treatment Goals addressed: Coping   Interventions: Psychosocial skills training, Supportive   Summary: Occupational Therapy group   Therapist Response: Patient engaged in group. See OT note.           Session Time: 12:00 -1:00   Participation Level: Active   Behavioral Response: CasualAlertDepressed   Type of Therapy: Group therapy   Treatment Goals addressed: Coping   Interventions: CBT; Solution focused; Supportive; Reframing   Summary: 12:00 - 12:50: [name] introduced topic of vulnerability. Group viewed TED talk "the power of vulnerability" to aid discussion. Group members shared the relationship they have with vulnerability and how it impacts their relationships.  12:50 -1:00 Clinician led check-out. Clinician assessed for immediate needs, medication compliance and efficacy, and safety concerns   Therapist Response: 12:50 - 1:00: Pt engaged in discussion and reports vulnerability is a struggle for her.  12:50 - 1:00: At check-out, patient rates her mood at a10 on a scale of 1-10 with  10 being great. Pt reports afternoon plans of going grocery shopping. Pt demonstrates some progress as evidenced by increased sharing. Patient denies SI/HI at the end of  group.    Suicidal/Homicidal: Nowithout intent/plan  Plan: Pt will continue in PHP while working to decrease depression symptoms, increase positive events, and increase ability to manage symptoms in a healthy manner.   Diagnosis: Severe episode of recurrent major depressive disorder, without psychotic features ([place]) [F33.2]    1. Severe episode of recurrent major depressive disorder, without psychotic features ([place])       Lorin Glass, LCSW 09/05/2021

## 2021-09-10 NOTE — Psych (Signed)
Virtual Visit via Video Note  I connected with Kelsey Vaughan on 07/23/21 at  9:00 AM EDT by a video enabled telemedicine application and verified that I am speaking with the correct person using two identifiers.  Location: Patient: patient home Provider: clinical home office   I discussed the limitations of evaluation and management by telemedicine and the availability of in person appointments. The patient expressed understanding and agreed to proceed.  I discussed the assessment and treatment plan with the patient. The patient was provided an opportunity to ask questions and all were answered. The patient agreed with the plan and demonstrated an understanding of the instructions.   The patient was advised to call back or seek an in-person evaluation if the symptoms worsen or if the condition fails to improve as anticipated.  Pt was provided 240 minutes of non-face-to-face time during this encounter.   Lorin Glass, LCSW    [facility] PHP THERAPIST PROGRESS NOTE  Kelsey Vaughan [id number]  Session Time: 9:00 - 10:00  Participation Level: Active  Behavioral Response: CasualAlertDepressed  Type of Therapy: Group Therapy  Treatment Goals addressed: Coping  Interventions: CBT, DBT, Supportive, and Reframing  Summary: Clinician led check-in regarding current stressors and situation. Clinician utilized active listening and empathetic response and validated patient emotions. Clinician facilitated processing group on pertinent issues.   Therapist Response: Kelsey Vaughan is a 45 y.o. female who presents with depression symptoms. Patient arrived within time allowed and reports that she is feeling "down." Patient rates her mood at a 6.5 on a scale of 1-10 with 10 being great. Pt reports she went to a family reunion over the weekend and pt struggled with relationship dynamics and anxiety. Pt shares increased depression symptoms. Pt able to process. Pt engaged in discussion.          Session Time: 10:00 - 11:00   Participation Level: Active   Behavioral Response: CasualAlertDepressed   Type of Therapy: Group Therapy   Treatment Goals addressed: Coping   Interventions: CBT, DBT, Supportive and Reframing   Summary: [name] led discussion on procrastination. [name] encouraged pt's to consider the feeling that is feeding procrastination and address it as a way of alleviating desire to procrastinate. [name] applied CBT thought challenging and DBT distress tolerance skills to aid discussion.      Therapist Response: Pt engaged in discussion and identifies the root feeling of their procrastination.         Session Time: 11:00- 12:00   Participation Level: Active   Behavioral Response: CasualAlertDepressed   Type of Therapy: Group Therapy, OT   Treatment Goals addressed: Coping   Interventions: Psychosocial skills training, Supportive   Summary: Occupational Therapy group   Therapist Response: Patient engaged in group. See OT note.           Session Time: 12:00 -1:00   Participation Level: Active   Behavioral Response: CasualAlertDepressed   Type of Therapy: Group therapy   Treatment Goals addressed: Coping   Interventions: CBT; Solution focused; Supportive; Reframing   Summary: 12:00 - 12:50: [name] led discussion on healthy aggression substitutes. [name] discussed the benefits to discharging energy and adrenaline when feeling "revved up" in emotion and the importance of balancing that discharge with safety and lack if negative consequences. Group brainstormed different ways to channel aggression in a healthy way and shared ways that have worked for them in the past. 12:50 -1:00 Clinician led check-out. Clinician assessed for immediate needs, medication compliance and efficacy, and safety concerns   Therapist Response:  12:50 - 1:00: Pt engaged in discussion and identifies 3 options to try.  12:50 - 1:00: At check-out, patient rates her mood at a 7 on a  scale of 1-10 with 10 being great. Pt reports afternoon plans of resting. Pt demonstrates some progress as evidenced by increased feeling acknowledgment. Patient denies SI/HI at the end of group.    Suicidal/Homicidal: Nowithout intent/plan  Plan: Pt will continue in PHP while working to decrease depression symptoms, increase positive events, and increase ability to manage symptoms in a healthy manner.   Diagnosis: Severe episode of recurrent major depressive disorder, without psychotic features ([place]) [F33.2]    1. Severe episode of recurrent major depressive disorder, without psychotic features ([place])       Lorin Glass, LCSW 09/10/2021
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
## 2021-09-13 NOTE — Psych (Signed)
Virtual Visit via Video Note  I connected with Kelsey Vaughan on 07/27/21 at  9:00 AM EDT by a video enabled telemedicine application and verified that I am speaking with the correct person using two identifiers.  Location: Patient: patient home Provider: clinical home office   I discussed the limitations of evaluation and management by telemedicine and the availability of in person appointments. The patient expressed understanding and agreed to proceed.  I discussed the assessment and treatment plan with the patient. The patient was provided an opportunity to ask questions and all were answered. The patient agreed with the plan and demonstrated an understanding of the instructions.   The patient was advised to call back or seek an in-person evaluation if the symptoms worsen or if the condition fails to improve as anticipated.  Pt was provided 240 minutes of non-face-to-face time during this encounter.   Lorin Glass, LCSW    [facility] PHP THERAPIST PROGRESS NOTE  Kelsey Vaughan [id number]  Session Time: 9:00 - 10:00  Participation Level: Active  Behavioral Response: CasualAlertDepressed  Type of Therapy: Group Therapy  Treatment Goals addressed: Coping  Interventions: CBT, DBT, Supportive, and Reframing  Summary: Clinician led check-in regarding current stressors and situation. Clinician utilized active listening and empathetic response and validated patient emotions. Clinician facilitated processing group on pertinent issues.   Therapist Response: Kelsey Vaughan is a 45 y.o. female who presents with depression symptoms. Patient arrived within time allowed and reports that she is feeling "pretty good." Patient rates her mood at a 9.5 on a scale of 1-10 with 10 being great. Pt reports she woke up in a good mood. Pt states improved energy this morning and feeling productive. Pt able to process. Pt engaged in discussion.         Session Time: 10:00 - 11:00    Participation Level: Active   Behavioral Response: CasualAlertDepressed   Type of Therapy: Group Therapy   Treatment Goals addressed: Coping   Interventions: CBT, DBT, Supportive and Reframing   Summary: [name] led processing group for pt's current struggles. Group members shared stressors and provided support and feedback. [name] brought in topics of boundaries, healthy relationships, and unhealthy thought processes to inform discussion.      Therapist Response: Pt able to process and provide support to group.           Session Time: 11:00 - 12:00   Participation Level: Active   Behavioral Response: CasualAlertDepressed   Type of Therapy: Group Therapy   Treatment Goals addressed: Coping   Interventions: CBT, DBT, Supportive and Reframing   Summary: [name] introduced grounding techniques as a coping strategy. [name] utilized handout "Detaching from emotional pain" from [employer]. Group reviewed grounding strategies and how they can apply them to their every day life and in which situations.      Therapist Response:  Pt engaged in discussion and is able to identify ways to utilize the techniques.          Session Time: 12:00 -1:00   Participation Level: Active   Behavioral Response: CasualAlertDepressed   Type of Therapy: Group therapy   Treatment Goals addressed: Coping   Interventions: Psychosocial skills training, Supportive   Summary: 12:00 - 12:50: Occupational Therapy group 12:50 -1:00 Clinician led check-out. Clinician assessed for immediate needs, medication compliance and efficacy, and safety concerns   Therapist Response: 12:00 - 12:50: Patient engaged in group. See OT note.  12:50 - 1:00: At check-out, patient rates her mood at a 9 on  a scale of 1-10 with 10 being great. Pt reports afternoon plans of doing schoolwork. Pt demonstrates some progress as evidenced by imprvoed ability to regulate herself. Patient denies SI/HI at the end of  group.      Suicidal/Homicidal: Nowithout intent/plan  Plan: Pt will continue in PHP while working to decrease depression symptoms, increase positive events, and increase ability to manage symptoms in a healthy manner.   Diagnosis: Severe episode of recurrent major depressive disorder, without psychotic features ([place]) [F33.2]    1. Severe episode of recurrent major depressive disorder, without psychotic features ([place])       Lorin Glass, LCSW 09/13/2021

## 2021-09-13 NOTE — Psych (Signed)
Virtual Visit via Video Note  I connected with Kelsey Vaughan on 07/24/21 at  9:00 AM EDT by a video enabled telemedicine application and verified that I am speaking with the correct person using two identifiers.  Location: Patient: patient home Provider: clinical home office   I discussed the limitations of evaluation and management by telemedicine and the availability of in person appointments. The patient expressed understanding and agreed to proceed.  I discussed the assessment and treatment plan with the patient. The patient was provided an opportunity to ask questions and all were answered. The patient agreed with the plan and demonstrated an understanding of the instructions.   The patient was advised to call back or seek an in-person evaluation if the symptoms worsen or if the condition fails to improve as anticipated.  Pt was provided 240 minutes of non-face-to-face time during this encounter.   Kelsey Glass, LCSW    [facility] PHP THERAPIST PROGRESS NOTE  Kelsey Vaughan [id number]  Session Time: 9:00 - 10:00  Participation Level: Active  Behavioral Response: CasualAlertDepressed  Type of Therapy: Group Therapy  Treatment Goals addressed: Coping  Interventions: CBT, DBT, Supportive, and Reframing  Summary: Clinician led check-in regarding current stressors and situation. Clinician utilized active listening and empathetic response and validated patient emotions. Clinician facilitated processing group on pertinent issues.   Therapist Response: Kelsey Vaughan is a 45 y.o. female who presents with depression symptoms. Patient arrived within time allowed and reports that she is feeling "alright." Patient rates her mood at a 8.5 on a scale of 1-10 with 10 being great. Pt reports dealing with a headache yesterday and sleeping poorly. Pt states struggling with other people coming to her with their problems. Pt able to process. Pt engaged in discussion.          Session Time: 10:00 - 11:00   Participation Level: Active   Behavioral Response: CasualAlertDepressed   Type of Therapy: Group Therapy   Treatment Goals addressed: Coping   Interventions: CBT, DBT, Supportive and Reframing   Summary: [name] introduced topic of stress management and the model of the "4 A's of stress management:" avoid, alter, accept, and adapt. Group members worked through Advice worker and discussed barriers to utilizing the 4 A's for stressors.      Therapist Response: Pt engaged in discussion and reports understanding of how to utilize the 4 A's.           Session Time: 11:00- 12:00   Participation Level: Active   Behavioral Response: CasualAlertDepressed   Type of Therapy: Group Therapy, OT   Treatment Goals addressed: Coping   Interventions: Psychosocial skills training, Supportive   Summary: Occupational Therapy group   Therapist Response: Patient engaged in group. See OT note.           Session Time: 12:00 -1:00   Participation Level: Active   Behavioral Response: CasualAlertDepressed   Type of Therapy: Group therapy   Treatment Goals addressed: Coping   Interventions: CBT; Solution focused; Supportive; Reframing   Summary: 12:00 - 12:50: [name] led discussion on rest. [name] discussed the need to rewrite social story of rest being earned or last on the list and assert that rest is productive. Group members discussed barriers to allowing themselves rest and the negative self-talk involved.  [name] worked with group to thought challenge and apply self-coaching strategies. 12:50 -1:00 Clinician led check-out. Clinician assessed for immediate needs, medication compliance and efficacy, and safety concerns   Therapist Response: 12:50 - 1:00: Pt engaged in discussion  and reports struggle with allowing themselves rest.  12:50 - 1:00: At check-out, patient rates her mood at a 9 on a scale of 1-10 with 10 being great. Pt reports afternoon plans of doing  coursework. Pt demonstrates some progress as evidenced by improved mood. Patient denies SI/HI at the end of group.    Suicidal/Homicidal: Nowithout intent/plan  Plan: Pt will continue in PHP while working to decrease depression symptoms, increase positive events, and increase ability to manage symptoms in a healthy manner.   Diagnosis: Severe episode of recurrent major depressive disorder, without psychotic features (Kelsey Vaughan) [F33.2]    1. Severe episode of recurrent major depressive disorder, without psychotic features (Kelsey Vaughan)       Kelsey Glass, LCSW 09/13/2021

## 2021-09-14 NOTE — Psych (Signed)
Virtual Visit via Video Note  I connected with [name] on 07/30/21 at  9:00 AM EDT by a video enabled telemedicine application and verified that I am speaking with the correct person using two identifiers.  Location: Patient: patient home Provider: clinical home office   I discussed the limitations of evaluation and management by telemedicine and the availability of in person appointments. The patient expressed understanding and agreed to proceed.  I discussed the assessment and treatment plan with the patient. The patient was provided an opportunity to ask questions and all were answered. The patient agreed with the plan and demonstrated an understanding of the instructions.   The patient was advised to call back or seek an in-person evaluation if the symptoms worsen or if the condition fails to improve as anticipated.  Pt was provided 90 minutes of non-face-to-face time during this encounter.   Lorin Glass, LCSW    [facility] PHP THERAPIST PROGRESS NOTE  Kynedi Profitt [id number]  Session Time: 9:00 - 10:00  Participation Level: Active  Behavioral Response: CasualAlertDepressed  Type of Therapy: Group Therapy  Treatment Goals addressed: Coping  Interventions: CBT, DBT, Supportive, and Reframing  Summary: Clinician led check-in regarding current stressors and situation. Clinician utilized active listening and empathetic response and validated patient emotions. Clinician facilitated processing group on pertinent issues.   Therapist Response: Kelsey Vaughan is a 45 y.o. female who presents with depression symptoms. Patient arrived within time allowed and reports that she is feeling "great." Patient rates her mood at a 10 on a scale of 1-10 with 10 being great. Pt reports her weekend was "great" and she spent time with family. Pt continues to struggle with mood dependent thinking. Pt able to process. Pt engaged in discussion.         Session Time: 10:00 -  11:00   Participation Level: Active   Behavioral Response: CasualAlertDepressed   Type of Therapy: Group Therapy   Treatment Goals addressed: Coping   Interventions: CBT, DBT, Supportive and Reframing   Summary: [name] led processing group for pt's current struggles. Group members shared stressors and provided support and feedback. [name] brought in topics of boundaries, healthy relationships, and unhealthy thought processes to inform discussion.      Therapist Response: Pt able to process and provide support to group.  **Pt chose to leave group at 10:30, stating she had a family issue.     Suicidal/Homicidal: Nowithout intent/plan  Plan: Pt will continue in PHP while working to decrease depression symptoms, increase positive events, and increase ability to manage symptoms in a healthy manner.   Diagnosis: Severe episode of recurrent major depressive disorder, without psychotic features ([place]) [F33.2]    1. Severe episode of recurrent major depressive disorder, without psychotic features ([place])       Lorin Glass, LCSW 09/14/2021

## 2021-09-14 NOTE — Psych (Signed)
Virtual Visit via Video Note  I connected with Kelsey Vaughan on 07/31/21 at  9:00 AM EDT by a video enabled telemedicine application and verified that I am speaking with the correct person using two identifiers.  Location: Patient: patient home Provider: clinical home office   I discussed the limitations of evaluation and management by telemedicine and the availability of in person appointments. The patient expressed understanding and agreed to proceed.  I discussed the assessment and treatment plan with the patient. The patient was provided an opportunity to ask questions and all were answered. The patient agreed with the plan and demonstrated an understanding of the instructions.   The patient was advised to call back or seek an in-person evaluation if the symptoms worsen or if the condition fails to improve as anticipated.  Pt was provided 240 minutes of non-face-to-face time during this encounter.   Lorin Glass, LCSW    [facility] PHP THERAPIST PROGRESS NOTE  Chelcee Korpi [id number]  Session Time: 9:00 - 10:00  Participation Level: Active  Behavioral Response: CasualAlertDepressed  Type of Therapy: Group Therapy  Treatment Goals addressed: Coping  Interventions: CBT, DBT, Supportive, and Reframing  Summary: Clinician led check-in regarding current stressors and situation. Clinician utilized active listening and empathetic response and validated patient emotions. Clinician facilitated processing group on pertinent issues.   Therapist Response: Kelsey Vaughan is a 45 y.o. female who presents with depression symptoms. Patient arrived within time allowed and reports that she is feeling "blah." Patient rates her mood at a 7 on a scale of 1-10 with 10 being great. Pt reports she dealt with family "drama" yesterday and felt frustrated. Pt reports struggles with communicating when upset. Pt able to process. Pt engaged in discussion.         Session Time: 10:00 -  11:00   Participation Level: Active   Behavioral Response: CasualAlertDepressed   Type of Therapy: Group Therapy   Treatment Goals addressed: Coping   Interventions: CBT, DBT, Supportive and Reframing   Summary: [name] led processing group for pt's current struggles. Group members shared stressors and provided support and feedback. [name] brought in topics of boundaries, healthy relationships, and unhealthy thought processes to inform discussion.      Therapist Response: Pt able to process and provide support to group.           Session Time: 11:00 - 12:00   Participation Level: Active   Behavioral Response: CasualAlertDepressed   Type of Therapy: Group Therapy   Treatment Goals addressed: Coping   Interventions: CBT, DBT, Supportive and Reframing   Summary: [name] led discussion on communicating our struggles with our support team. [name] introduced the concept of giving disclosures to the people close to Kelsey Vaughan regarding the way we act in specific situations or the way we process certain stimuli. Group members discussed ways to provide this "road map to our brain" and in what situations this may be helpful to them      Therapist Response:  Pt engaged in discussion and is able to determine situations in which providing a disclosure can be helpful and practiced doing so.         Session Time: 12:00 -1:00   Participation Level: Active   Behavioral Response: CasualAlertDepressed   Type of Therapy: Group therapy   Treatment Goals addressed: Coping   Interventions: Psychosocial skills training, Supportive   Summary: 12:00 - 12:50: Occupational Therapy group 12:50 -1:00 Clinician led check-out. Clinician assessed for immediate needs, medication compliance and efficacy, and safety  concerns   Therapist Response: 12:00 - 12:50: Patient engaged in group. See OT note.  12:50 - 1:00: At check-out, patient rates her mood at a 10 on a scale of 1-10 with 10 being great. Pt reports afternoon  plans of doing schoolwork. Pt demonstrates some progress as evidenced by increased insight into problematic behaviors. Patient denies SI/HI at the end of group.      Suicidal/Homicidal: Nowithout intent/plan  Plan: Pt will continue in PHP while working to decrease depression symptoms, increase positive events, and increase ability to manage symptoms in a healthy manner.   Diagnosis: Severe episode of recurrent major depressive disorder, without psychotic features (Kelsey Vaughan) [F33.2]    1. Severe episode of recurrent major depressive disorder, without psychotic features (Kelsey Vaughan)       Lorin Glass, LCSW 09/14/2021

## 2021-09-14 NOTE — Psych (Signed)
Virtual Visit via Video Note  I connected with Kelsey Vaughan on 08/01/21 at  9:00 AM EDT by a video enabled telemedicine application and verified that I am speaking with the correct person using two identifiers.  Location: Patient: patient home Provider: clinical home office   I discussed the limitations of evaluation and management by telemedicine and the availability of in person appointments. The patient expressed understanding and agreed to proceed.  I discussed the assessment and treatment plan with the patient. The patient was provided an opportunity to ask questions and all were answered. The patient agreed with the plan and demonstrated an understanding of the instructions.   The patient was advised to call back or seek an in-person evaluation if the symptoms worsen or if the condition fails to improve as anticipated.  Pt was provided 240 minutes of non-face-to-face time during this encounter.   Lorin Glass, LCSW    [facility] PHP THERAPIST PROGRESS NOTE  Farra Nikolic [id number]  Session Time: 9:00 - 10:00  Participation Level: Active  Behavioral Response: CasualAlertDepressed  Type of Therapy: Group Therapy  Treatment Goals addressed: Coping  Interventions: CBT, DBT, Supportive, and Reframing  Summary: Clinician led check-in regarding current stressors and situation. Clinician utilized active listening and empathetic response and validated patient emotions. Clinician facilitated processing group on pertinent issues.   Therapist Response: Arieona Swaggerty is a 45 y.o. female who presents with depression symptoms. Patient arrived within time allowed and reports that she is feeling "good." Patient rates her mood at a 10 on a scale of 1-10 with 10 being great. Pt reports being stressed out yesterday due to looming deadlines and feeling she is not making progress fast enough. Pt states applying communication skill with her partner and being pleased with how it  went. Pt able to process. Pt engaged in discussion.         Session Time: 10:00 - 11:00   Participation Level: Active   Behavioral Response: CasualAlertDepressed   Type of Therapy: Group Therapy   Treatment Goals addressed: Coping   Interventions: CBT, DBT, Supportive and Reframing   Summary: [name] led processing group for pt's current struggles. Group members shared stressors and provided support and feedback. [name] brought in topics of boundaries, healthy relationships, and unhealthy thought processes to inform discussion.      Therapist Response: Pt able to process and provide support to group.           Session Time: 11:00 - 12:00   Participation Level: Active   Behavioral Response: CasualAlertDepressed   Type of Therapy: Group Therapy   Treatment Goals addressed: Coping   Interventions: CBT, DBT, Supportive and Reframing   Summary: Nutrition group with dietician K. Watts. Group discussed how nutrition can support our mental health.      Therapist Response: Pt engaged in discussion.         Session Time: 12:00 -1:00   Participation Level: Active   Behavioral Response: CasualAlertDepressed   Type of Therapy: Group therapy   Treatment Goals addressed: Coping   Interventions: Psychosocial skills training, Supportive   Summary: 12:00 - 12:50: Occupational Therapy group 12:50 -1:00 Clinician led check-out. Clinician assessed for immediate needs, medication compliance and efficacy, and safety concerns   Therapist Response: 12:00 - 12:50: Patient engaged in group. See OT note.  12:50 - 1:00: At check-out, patient rates her mood at a 10 on a scale of 1-10 with 10 being great. Pt reports afternoon plans of doing schoolwork. Pt demonstrates some progress as  evidenced by applying strategies. Patient denies SI/HI at the end of group.     Suicidal/Homicidal: Nowithout intent/plan  Plan: Pt will continue in PHP while working to decrease depression symptoms,  increase positive events, and increase ability to manage symptoms in a healthy manner.   Diagnosis: Severe episode of recurrent major depressive disorder, without psychotic features ([place]) [F33.2]    1. Severe episode of recurrent major depressive disorder, without psychotic features ([place])       Lorin Glass, LCSW 09/14/2021

## 2021-09-14 NOTE — Psych (Signed)
Virtual Visit via Video Note  I connected with [name] on 08/03/21 at  9:00 AM EDT by a video enabled telemedicine application and verified that I am speaking with the correct person using two identifiers.  Location: Patient: patient home Provider: clinical home office   I discussed the limitations of evaluation and management by telemedicine and the availability of in person appointments. The patient expressed understanding and agreed to proceed.  I discussed the assessment and treatment plan with the patient. The patient was provided an opportunity to ask questions and all were answered. The patient agreed with the plan and demonstrated an understanding of the instructions.   The patient was advised to call back or seek an in-person evaluation if the symptoms worsen or if the condition fails to improve as anticipated.  Pt was provided 240 minutes of non-face-to-face time during this encounter.   Lorin Glass, LCSW    [facility] PHP THERAPIST PROGRESS NOTE  Saxon Crosby [phone]  Session Time: 9:00 - 10:00  Participation Level: Active  Behavioral Response: CasualAlertDepressed  Type of Therapy: Group Therapy  Treatment Goals addressed: Coping  Interventions: CBT, DBT, Supportive, and Reframing  Summary: Clinician led check-in regarding current stressors and situation. Clinician utilized active listening and empathetic response and validated patient emotions. Clinician facilitated processing group on pertinent issues.   Therapist Response: Ileene Allie is a 45 y.o. female who presents with depression symptoms. Patient arrived within time allowed and reports that she is feeling "good." Patient rates her mood at a 10 on a scale of 1-10 with 10 being great. Pt reports yesterday was "okay" and she did not achieve what she wanted to. Pt reports struggling with not being hard on herself. Pt able to process. Pt engaged in discussion.         Session Time:  10:00 - 11:00   Participation Level: Active   Behavioral Response: CasualAlertDepressed   Type of Therapy: Group Therapy   Treatment Goals addressed: Coping   Interventions: CBT, DBT, Supportive and Reframing   Summary: CCln continued discussion on topic of boundaries. Group reviewed previous aspects of boundaries discussed. [name] utilized handout "Tips for [employer]" and group members discussed how to apply the tips. [name] shaped conversation and cued for healthy boundary characteristics.      Therapist Response: Pt engaged in discussion and is able to process ways to increase their healthy boundaries.             Session Time: 11:00- 12:00   Participation Level: Active   Behavioral Response: CasualAlertDepressed   Type of Therapy: Group Therapy   Treatment Goals addressed: Coping   Interventions: CBT, DBT, Supportive and Reframing   Summary: [name] continued topic of CBT cognitive distortions and introduced thought challenging as a way to  utilize the "challenge" C in C-C-C. Group utilized Web designer questions" as a way to introduce challenges and reframe distorted thinking. Group members worked through pt examples to practice challenging distorted thinking.      Therapist Response: Pt engaged in discussion and demonstrates understanding of challenging distorted thoughts through practice.          Session Time: 12:00 -1:00   Participation Level: Active   Behavioral Response: CasualAlertDepressed   Type of Therapy: Group therapy   Treatment Goals addressed: Coping   Interventions: CBT; Solution focused; Supportive; Reframing   Summary: 12:00 - 12:50: [name] introduced topic of positive psychology. Group discussed the 5 ways to train your brain to scan for the positive: conscious  acts of kindness, meditation, exercise, postive event journaling, and gratitudes. Group members discussed how to apply the principles in their every day life.  12:50 -1:00 Clinician  led check-out. Clinician assessed for immediate needs, medication compliance and efficacy, and safety concerns   Therapist Response: 12:50 - 1:00: Pt engaged in discussion and reports she can start by utilizing meditation.  12:50 - 1:00: At check-out, patient rates her mood at a 9 on a scale of 1-10 with 10 being great. Pt reports afternoon plans of doing schoolwork. Pt demonstrates some progress as evidenced by increased ability to manage stress. Patient denies SI/HI at the end of group.     Suicidal/Homicidal: Nowithout intent/plan  Plan: Pt will continue in PHP while working to decrease depression symptoms, increase positive events, and increase ability to manage symptoms in a healthy manner.   Diagnosis: Severe episode of recurrent major depressive disorder, without psychotic features ([place]) [F33.2]    1. Severe episode of recurrent major depressive disorder, without psychotic features ([place])       Lorin Glass, LCSW 09/14/2021

## 2021-09-14 NOTE — Psych (Signed)
Virtual Visit via Video Note  I connected with Kelsey Vaughan on 08/06/21 at 9:00 AM EDT by a video enabled telemedicine application and verified that I am speaking with the correct person using two identifiers.  Location: Patient: patient home Provider: clinical home office   I discussed the limitations of evaluation and management by telemedicine and the availability of in person appointments. The patient expressed understanding and agreed to proceed.  I discussed the assessment and treatment plan with the patient. The patient was provided an opportunity to ask questions and all were answered. The patient agreed with the plan and demonstrated an understanding of the instructions.   The patient was advised to call back or seek an in-person evaluation if the symptoms worsen or if the condition fails to improve as anticipated.  Pt was provided 150 minutes of non-face-to-face time during this encounter.   Lorin Glass, LCSW    [facility] PHP THERAPIST PROGRESS NOTE  Kelsey Vaughan [id number]  Session Time: 9:00 - 10:00  Participation Level: Active  Behavioral Response: CasualAlertDepressed  Type of Therapy: Group Therapy  Treatment Goals addressed: Coping  Interventions: CBT, DBT, Supportive, and Reframing  Summary: Clinician led check-in regarding current stressors and situation. Clinician utilized active listening and empathetic response and validated patient emotions. Clinician facilitated processing group on pertinent issues.   Therapist Response: Kelsey Vaughan is a 45 y.o. female who presents with depression symptoms. Patient arrived within time allowed and reports that she is feeling "good." Patient rates her mood at a 10 on a scale of 1-10 with 10 being great. Pt reports she had her grandson this weekend and it went well and she is tired. Pt able to process. Pt engaged in discussion.         Session Time: 10:00 - 11:00   Participation Level: Active    Behavioral Response: CasualAlertDepressed   Type of Therapy: Group Therapy   Treatment Goals addressed: Coping   Interventions: CBT, DBT, Supportive and Reframing   Summary: [name] led processing group for pt's current struggles. Group members shared stressors and provided support and feedback. [name] brought in topics of boundaries, healthy relationships, and unhealthy thought processes to inform discussion.     Therapist Response: Pt able to process and provide support to group.            Session Time: 11:00- 12:00   Participation Level: Active   Behavioral Response: CasualAlertDepressed   Type of Therapy: Group Therapy   Treatment Goals addressed: Coping   Interventions: CBT, DBT, Supportive and Reframing   Summary: [name] led discussion on impulsivity. Group discussed struggles with impulsivity. [name] highlighted theme of immediacy. Group built insight around the way immediacy interacts with impulsivity. [name] encouraged pt's to consider mantras and grounding statements to remind themselves there is time to think/feel/act.      Therapist Response: Pt engaged in discussion and is able to make connections and gain insight.    **Pt chose to leave group at 67:59 for a conflicting appointment.     Suicidal/Homicidal: Nowithout intent/plan  Plan: Pt will discharge from PHP due to meeting treatment goals of decreased depression symptoms, increased positive events, and increased ability to manage symptoms in a healthy manner. Pt has declined IOP and will return to her regular OP providers. Pt and provider are aligned with discharge. Pt denies SI/HI at time of discharge.   Diagnosis: Severe episode of recurrent major depressive disorder, without psychotic features ([place]) [F33.2]    1. Severe episode of recurrent major  depressive disorder, without psychotic features ([place])       Lorin Glass, LCSW 09/14/2021

## 2021-09-14 NOTE — Psych (Signed)
Virtual Visit via Video Note  I connected with Kelsey Vaughan on 08/02/21 at  9:00 AM EDT by a video enabled telemedicine application and verified that I am speaking with the correct person using two identifiers.  Location: Patient: patient home Provider: clinical home office   I discussed the limitations of evaluation and management by telemedicine and the availability of in person appointments. The patient expressed understanding and agreed to proceed.  I discussed the assessment and treatment plan with the patient. The patient was provided an opportunity to ask questions and all were answered. The patient agreed with the plan and demonstrated an understanding of the instructions.   The patient was advised to call back or seek an in-person evaluation if the symptoms worsen or if the condition fails to improve as anticipated.  Pt was provided 240 minutes of non-face-to-face time during this encounter.   Lorin Glass, LCSW    [facility] PHP THERAPIST PROGRESS NOTE  Kelsey Vaughan [id number]  Session Time: 9:00 - 10:00  Participation Level: Active  Behavioral Response: CasualAlertDepressed  Type of Therapy: Group Therapy  Treatment Goals addressed: Coping  Interventions: CBT, DBT, Supportive, and Reframing  Summary: Clinician led check-in regarding current stressors and situation. Clinician utilized active listening and empathetic response and validated patient emotions. Clinician facilitated processing group on pertinent issues.   Therapist Response: Kelsey Vaughan is a 45 y.o. female who presents with depression symptoms. Patient arrived within time allowed and reports that she is feeling "down." Patient rates her mood at a 7.5 on a scale of 1-10 with 10 being great. Pt reports feeling overwhelmed yesterday and avoided people and isolated. Pt states she did not sleep well and is feeling stressed. Pt able to process. Pt engaged in discussion.         Session Time:  10:00 - 11:00   Participation Level: Active   Behavioral Response: CasualAlertDepressed   Type of Therapy: Group Therapy   Treatment Goals addressed: Coping   Interventions: CBT, DBT, Supportive and Reframing   Summary: [name] led discussion on healthy relationships. Group members shared issues they have experienced in past relationships and in identifying what "healthy" looks like. [name] discussed respect, trust, and honesty as non-negotiable traits in a healthy dynamic. Group shared and problem solved barriers to recognizing and priortizing these traits.      Therapist Response: Pt engaged in discussion and is able to process.           Session Time: 11:00- 12:00   Participation Level: Active   Behavioral Response: CasualAlertDepressed   Type of Therapy: Group Therapy   Treatment Goals addressed: Coping   Interventions: CBT, DBT, Supportive and Reframing   Summary: [name] introduced topic of CBT cognitive distortions. [name] discussed unhealthy thought patterns and how our thoughts shape our reality and irrational thoughts can alter our perspective. [name] utilized handout "cognitive distortions" to discuss common examples of distorted thoughts and group members worked to identify examples in their own life.      Therapist Response: Pt engaged in discussion and is able to determine examples of distorted thinking in their own life.         Session Time: 12:00 -1:00   Participation Level: Active   Behavioral Response: CasualAlertDepressed   Type of Therapy: Group therapy   Treatment Goals addressed: Coping   Interventions: CBT; Solution focused; Supportive; Reframing   Summary: 12:00 - 12:50: [name] continued topic of CBT cognitive distortions and utilized handout "Unhealthy Thought Patterns"to review common examples of distorted  thought to increase awareness of the distorted thoughts. 12:50 -1:00 Clinician led check-out. Clinician assessed for immediate needs, medication compliance  and efficacy, and safety concerns   Therapist Response: 12:50 - 1:00: Pt engaged in discussion and is able to make connections.  12:50 - 1:00: At check-out, patient rates her mood at a 10 on a scale of 1-10 with 10 being great. Pt reports afternoon plans of doing schoolwork. Pt demonstrates some progress as evidenced by increased recognition of feelings. Patient denies SI/HI at the end of group.     Suicidal/Homicidal: Nowithout intent/plan  Plan: Pt will continue in PHP while working to decrease depression symptoms, increase positive events, and increase ability to manage symptoms in a healthy manner.   Diagnosis: Severe episode of recurrent major depressive disorder, without psychotic features ([place]) [F33.2]    1. Severe episode of recurrent major depressive disorder, without psychotic features ([place])       Lorin Glass, LCSW 09/14/2021

## 2021-09-17 ENCOUNTER — Ambulatory Visit: Payer: BLUE CROSS/BLUE SHIELD | Admitting: Physician Assistant

## 2021-09-20 NOTE — Telephone Encounter (Signed)
Per CMM, call insurance company at [phone].   Called and they stated they needed office notes faxed to continue with the PA. Office notes faxed to [phone] and a fax confirmation was received.

## 2021-09-21 ENCOUNTER — Encounter (INDEPENDENT_AMBULATORY_CARE_PROVIDER_SITE_OTHER): Payer: BLUE CROSS/BLUE SHIELD

## 2021-09-21 DIAGNOSIS — M47816 Spondylosis without myelopathy or radiculopathy, lumbar region: Secondary | ICD-10-CM

## 2021-09-24 NOTE — Telephone Encounter (Signed)
I spent 5 total minutes of online digital evaluation and management services. 

## 2021-09-25 NOTE — Telephone Encounter (Signed)
Medication: cariprazine (VRAYLAR) 1.5 MG capsule Prior authorization determination received Medication has been approved Approval dates: 09/20/2021-09/20/2022  Patient aware via: [place] aware: Yes Provider aware via this encounter

## 2021-09-26 ENCOUNTER — Ambulatory Visit (INDEPENDENT_AMBULATORY_CARE_PROVIDER_SITE_OTHER): Payer: BLUE CROSS/BLUE SHIELD | Admitting: Professional

## 2021-09-26 DIAGNOSIS — F4323 Adjustment disorder with mixed anxiety and depressed mood: Secondary | ICD-10-CM

## 2021-10-02 ENCOUNTER — Ambulatory Visit (INDEPENDENT_AMBULATORY_CARE_PROVIDER_SITE_OTHER): Payer: BLUE CROSS/BLUE SHIELD | Admitting: Professional

## 2021-10-02 DIAGNOSIS — F4323 Adjustment disorder with mixed anxiety and depressed mood: Secondary | ICD-10-CM

## 2021-10-04 ENCOUNTER — Ambulatory Visit
Admission: RE | Admit: 2021-10-04 | Discharge: 2021-10-04 | Disposition: A | Discharge status: Home / Self Care | Payer: BLUE CROSS/BLUE SHIELD | Source: Ambulatory Visit | Attending: Sports Medicine | Admitting: Sports Medicine

## 2021-10-04 DIAGNOSIS — M47816 Spondylosis without myelopathy or radiculopathy, lumbar region: Secondary | ICD-10-CM

## 2021-10-04 MED ORDER — METHYLPREDNISOLONE ACETATE 40 MG/ML INJ SUSP (RADIOLOG
60.0000 mg | Freq: Once | INTRAMUSCULAR | Status: AC
  Administered 2021-10-04: 60 mg via INTRA_ARTICULAR

## 2021-10-04 MED ORDER — IOPAMIDOL (ISOVUE-M 200) INJECTION 41%
1.0000 mL | Freq: Once | INTRAMUSCULAR | Status: AC
  Administered 2021-10-04: 1 mL via INTRA_ARTICULAR

## 2021-10-04 NOTE — Discharge Instructions (Signed)

## 2021-10-09 ENCOUNTER — Other Ambulatory Visit: Payer: Self-pay

## 2021-10-09 ENCOUNTER — Ambulatory Visit (AMBULATORY_SURGERY_CENTER): Payer: BLUE CROSS/BLUE SHIELD | Admitting: *Deleted

## 2021-10-09 ENCOUNTER — Other Ambulatory Visit (HOSPITAL_BASED_OUTPATIENT_CLINIC_OR_DEPARTMENT_OTHER): Payer: Self-pay

## 2021-10-09 VITALS — Ht 62.0 in | Wt 170.0 lb

## 2021-10-09 DIAGNOSIS — Z1211 Encounter for screening for malignant neoplasm of colon: Secondary | ICD-10-CM

## 2021-10-09 MED ORDER — PLENVU 140 G PO SOLR
1.0000 | ORAL | 0 refills | Status: DC
  Filled 2021-10-09: qty 3, 1d supply, fill #0

## 2021-10-09 NOTE — Progress Notes (Signed)
No egg or soy allergy known to patient   issues known to pt with past sedation with any surgeries or procedures PONV  Patient denies ever being told they had issues or difficulty with intubation  No FH of Malignant Hyperthermia Pt is not on diet pills Pt is not on  home 02  Pt is not on blood thinners  Pt denies issues with constipation - regular no issues- past hx of constipation  Past hx  A fib 2017- none since   Pt is fully vaccinated  for Covid   Plenvu  Coupon given to pt in PV today , Code to Pharmacy and  NO PA's for preps discussed with pt In PV today  Discussed with pt there will be an out-of-pocket cost for prep and that varies from $0 to 70 +  dollars - pt verbalized understanding   Due to the COVID-19 pandemic we are asking patients to follow certain guidelines in PV and the [place]   Pt aware of COVID protocols and [facility] guidelines   Pt verified name, DOB, address and insurance during PV today.  Pt mailed instruction packet of Emmi video, copy of consent form to read and not return, and instructions. Plenvu  coupon mailed in packet. PV completed over the phone.  Pt encouraged to call with questions or issues.  My Chart instructions to pt as well

## 2021-10-15 ENCOUNTER — Ambulatory Visit: Payer: BLUE CROSS/BLUE SHIELD | Admitting: Professional

## 2021-10-16 ENCOUNTER — Encounter: Payer: Self-pay | Admitting: Gastroenterology

## 2021-10-17 ENCOUNTER — Telehealth: Payer: Self-pay | Admitting: Gastroenterology

## 2021-10-17 ENCOUNTER — Other Ambulatory Visit (HOSPITAL_BASED_OUTPATIENT_CLINIC_OR_DEPARTMENT_OTHER): Payer: Self-pay

## 2021-10-17 DIAGNOSIS — Z1211 Encounter for screening for malignant neoplasm of colon: Secondary | ICD-10-CM

## 2021-10-17 MED ORDER — PEG 3350-KCL-NA BICARB-NACL 420 G PO SOLR
4000.0000 mL | Freq: Once | ORAL | 0 refills | Status: AC
  Filled 2021-10-17: qty 4000, 1d supply, fill #0

## 2021-10-17 NOTE — Telephone Encounter (Signed)
Pt states she cannot afford the $50 cost of her prep.  Golytely is offered and she is agreeable to this. She is aware this prep is a higher volume.  RX sent to her pharmacy and instructions sent via [place]

## 2021-10-17 NOTE — Telephone Encounter (Signed)
Inbound call from patient states pharmacy explained to her that the coupon for plenvu only took $10 off and patient balance would still be $50. Patient states she is still unable to afford it and would like a different solution.

## 2021-10-18 ENCOUNTER — Other Ambulatory Visit (HOSPITAL_BASED_OUTPATIENT_CLINIC_OR_DEPARTMENT_OTHER): Payer: Self-pay

## 2021-10-19 ENCOUNTER — Other Ambulatory Visit (HOSPITAL_BASED_OUTPATIENT_CLINIC_OR_DEPARTMENT_OTHER): Payer: Self-pay

## 2021-10-19 ENCOUNTER — Other Ambulatory Visit: Payer: Self-pay | Admitting: Gastroenterology

## 2021-10-19 ENCOUNTER — Encounter: Payer: Self-pay | Admitting: Physician Assistant

## 2021-10-19 ENCOUNTER — Ambulatory Visit (INDEPENDENT_AMBULATORY_CARE_PROVIDER_SITE_OTHER): Payer: BLUE CROSS/BLUE SHIELD | Admitting: Physician Assistant

## 2021-10-19 VITALS — BP 122/83 | HR 96 | Ht 62.0 in | Wt 178.0 lb

## 2021-10-19 DIAGNOSIS — Z1211 Encounter for screening for malignant neoplasm of colon: Secondary | ICD-10-CM

## 2021-10-19 DIAGNOSIS — Z79899 Other long term (current) drug therapy: Secondary | ICD-10-CM | POA: Diagnosis not present

## 2021-10-19 MED ORDER — PLENVU 140 G PO SOLR
1.0000 | ORAL | 0 refills | Status: DC
  Filled 2021-10-19: qty 3, 1d supply, fill #0

## 2021-10-19 NOTE — Telephone Encounter (Signed)
Inbound call from patient requesting for prep to be switched back to Plenvu please.

## 2021-10-19 NOTE — Telephone Encounter (Signed)
Sent in Plenvu to [facility] at pt's request  Pt's PCP printed the Plenvu instructions for pt today at her Office visit

## 2021-10-19 NOTE — Progress Notes (Signed)
   Subjective:    Patient ID: Kelsey Vaughan, female    DOB: 1976/11/24, 45 y.o.   MRN: [id number]  HPI Pt is a 45 yo female who has colonoscopy scheduled for 11/8 and questions how to take prep. She states she was never told how to take it.     Review of Systems See HPI.     Objective:   Physical Exam        Assessment & Plan:  [name] KitchenMarland KitchenNiesha was seen today for follow-up.  Diagnoses and all orders for this visit:  Medication management  Called GI and printed letters for both prep solution. Pt understands how to complete both preps.

## 2021-10-19 NOTE — Telephone Encounter (Signed)
Pt aware prep was resent

## 2021-10-19 NOTE — Addendum Note (Signed)
Addended by: Steva Ready on: 10/19/2021 10:27 AM   Modules accepted: Orders

## 2021-10-22 ENCOUNTER — Ambulatory Visit: Payer: BLUE CROSS/BLUE SHIELD | Admitting: Professional

## 2021-10-23 ENCOUNTER — Encounter: Payer: Self-pay | Admitting: Gastroenterology

## 2021-10-23 ENCOUNTER — Other Ambulatory Visit: Payer: Self-pay

## 2021-10-23 ENCOUNTER — Ambulatory Visit (AMBULATORY_SURGERY_CENTER): Payer: BLUE CROSS/BLUE SHIELD | Admitting: Gastroenterology

## 2021-10-23 VITALS — BP 119/79 | HR 83 | Temp 98.2°F | Resp 13 | Ht 62.0 in | Wt 178.0 lb

## 2021-10-23 DIAGNOSIS — Z1211 Encounter for screening for malignant neoplasm of colon: Secondary | ICD-10-CM

## 2021-10-23 MED ORDER — SODIUM CHLORIDE 0.9 % IV SOLN: 500.0000 mL | Freq: Once | INTRAVENOUS | Status: DC

## 2021-10-23 NOTE — Op Note (Signed)
[place] Patient Name: Kelsey Vaughan Procedure Date: 10/23/2021 2:09 PM MRN: [id number] Endoscopist: Ladene Artist , MD Age: 45 Referring MD:  Date of Birth: 31-Jul-1976 Gender: Female Account #: [id number] Procedure:                Colonoscopy Indications:              Screening for colorectal malignant neoplasm Medicines:                Monitored Anesthesia Care Procedure:                Pre-Anesthesia Assessment:                           - Prior to the procedure, a History and Physical                            was performed, and patient medications and                            allergies were reviewed. The patient's tolerance of                            previous anesthesia was also reviewed. The risks                            and benefits of the procedure and the sedation                            options and risks were discussed with the patient.                            All questions were answered, and informed consent                            was obtained. Prior Anticoagulants: The patient has                            taken no previous anticoagulant or antiplatelet                            agents. ASA Grade Assessment: II - A patient with                            mild systemic disease. After reviewing the risks                            and benefits, the patient was deemed in                            satisfactory condition to undergo the procedure.                           After obtaining informed consent, the colonoscope  was passed under direct vision. Throughout the                            procedure, the patient's blood pressure, pulse, and                            oxygen saturations were monitored continuously. The                            Olympus CF-HQ190L [id number]) Colonoscope was                            introduced through the anus and advanced to the the                            cecum,  identified by appendiceal orifice and                            ileocecal valve. The ileocecal valve, appendiceal                            orifice, and rectum were photographed. The quality                            of the bowel preparation was excellent. The                            colonoscopy was performed without difficulty. The                            patient tolerated the procedure well. Scope In: 2:22:32 PM Scope Out: 2:32:28 PM Scope Withdrawal Time: 0 hours 8 minutes 31 seconds  Total Procedure Duration: 0 hours 9 minutes 56 seconds  Findings:                 The perianal and digital rectal examinations were                            normal.                           The entire examined colon appeared normal on direct                            and retroflexion views. Complications:            No immediate complications. Estimated blood loss:                            None. Estimated Blood Loss:     Estimated blood loss: none. Impression:               - The entire examined colon is normal on direct and                            retroflexion views.                           -  No specimens collected. Recommendation:           - Repeat colonoscopy in 10 years for screening                            purposes.                           - Patient has a contact number available for                            emergencies. The signs and symptoms of potential                            delayed complications were discussed with the                            patient. Return to normal activities tomorrow.                            Written discharge instructions were provided to the                            patient.                           - Resume previous diet.                           - Continue present medications. Ladene Artist, MD 10/23/2021 2:35:00 PM This report has been signed electronically.

## 2021-10-23 NOTE — Progress Notes (Signed)
History & Physical  Primary Care Physician:  Lavada Mesi Primary Gastroenterologist: Lucio Edward, MD  CHIEF COMPLAINT:  CRC screening   HPI: Kelsey Vaughan is a 45 y.o. female who is average risk for [facility] presenting for colonoscopy.    Past Medical History:  Diagnosis Date   Allergy    Anxiety    Arthritis    Atrial fibrillation ([place])    a. occuring in 02/2016   B12 deficiency    Complication of anesthesia    Constipation    Foreign body in foot, left 07/2013   GERD (gastroesophageal reflux disease)    Hematuria    Irregular menses    due to oral contraceptive   Palpitations    a. 01/2017: Event monitor showing SR with PVC's.    PONV (postoperative nausea and vomiting)    Sinus tachycardia    Thyroid nodule    Vitamin D deficiency     Past Surgical History:  Procedure Laterality Date   CESAREAN SECTION     FOREIGN BODY REMOVAL Left 08/05/2013   Procedure: LEFT FOOT FOREIGN BODY REMOVAL ADULT;  Surgeon: Wylene Simmer, MD;  Location: [facility];  Service: Orthopedics;  Laterality: Left;   SHOULDER ARTHROSCOPY Bilateral    TUBAL LIGATION      Prior to Admission medications   Medication Sig Start Date End Date Taking? Authorizing Provider  desogestrel-ethinyl estradiol (JULEBER) 0.15-30 MG-MCG tablet Take 1 tablet by mouth daily. 06/15/21 06/15/22 Yes Breeback, Jade L, PA-C  PEG-KCl-NaCl-NaSulf-Na Asc-C (PLENVU) 140 g SOLR Take 1 kit by mouth as directed. 10/19/21  Yes Ladene Artist, MD  aspirin EC 81 MG tablet Take 1 tablet (81 mg total) by mouth daily. Patient not taking: Reported on 10/23/2021 02/16/20   Darreld Mclean, PA-C  cariprazine (VRAYLAR) 1.5 MG capsule Take 1 capsule (1.5 mg total) by mouth daily. 07/09/21   Breeback, Royetta Car, PA-C  levocetirizine (XYZAL) 5 MG tablet Take 1 tablet (5 mg total) by mouth every evening. 07/17/20   Breeback, Jade L, PA-C  mometasone (NASONEX) 50 MCG/ACT nasal spray One spray in each nostril twice a  day, use left hand for right nostril, and right hand for left nostril.  Please dispense one bottle. Patient taking differently: One spray in each nostril twice a day, use left hand for right nostril, and right hand for left nostril.  Please dispense one bottle. 07/17/20   Breeback, Jade L, PA-C  nebivolol (BYSTOLIC) 5 MG tablet Take 1 tablet (5 mg total) by mouth in the morning and at bedtime. 06/14/20   Duke, Tami Lin, PA  SUMAtriptan (IMITREX) 100 MG tablet TAKE 1 TABLET (100 MG TOTAL) BY MOUTH ONCE AS NEEDED FOR UP TO 1 DOSE FOR MIGRAINE. MAY REPEAT IN 2 HOURS IF HEADACHE PERSISTS OR RECURS. 06/20/20 10/09/21  Breeback, Jade L, PA-C  traZODone (DESYREL) 50 MG tablet Take 1/2 -1 tablet (25-50 mg total) by mouth at bedtime as needed for sleep. 06/12/21   Breeback, Jade L, PA-C  Ubrogepant (UBRELVY) 100 MG TABS Take 1 tablet by mouth as needed. For migraine rescue. 02/16/20   Breeback, Royetta Car, PA-C  valACYclovir (VALTREX) 1000 MG tablet TAKE 1 TABLET BY MOUTH 2 TIMES DAILY 01/02/21 01/02/22  Donella Stade, PA-C    Current Outpatient Medications  Medication Sig Dispense Refill   desogestrel-ethinyl estradiol (JULEBER) 0.15-30 MG-MCG tablet Take 1 tablet by mouth daily. 84 tablet 1   PEG-KCl-NaCl-NaSulf-Na Asc-C (PLENVU) 140 g SOLR Take 1 kit  by mouth as directed. 3 each 0   aspirin EC 81 MG tablet Take 1 tablet (81 mg total) by mouth daily. (Patient not taking: Reported on 10/23/2021)     cariprazine (VRAYLAR) 1.5 MG capsule Take 1 capsule (1.5 mg total) by mouth daily. 30 capsule 1   levocetirizine (XYZAL) 5 MG tablet Take 1 tablet (5 mg total) by mouth every evening. 90 tablet 1   mometasone (NASONEX) 50 MCG/ACT nasal spray One spray in each nostril twice a day, use left hand for right nostril, and right hand for left nostril.  Please dispense one bottle. (Patient taking differently: One spray in each nostril twice a day, use left hand for right nostril, and right hand for left nostril.  Please  dispense one bottle.) 1 g 6   nebivolol (BYSTOLIC) 5 MG tablet Take 1 tablet (5 mg total) by mouth in the morning and at bedtime. 180 tablet 3   SUMAtriptan (IMITREX) 100 MG tablet TAKE 1 TABLET (100 MG TOTAL) BY MOUTH ONCE AS NEEDED FOR UP TO 1 DOSE FOR MIGRAINE. MAY REPEAT IN 2 HOURS IF HEADACHE PERSISTS OR RECURS. 10 tablet 5   traZODone (DESYREL) 50 MG tablet Take 1/2 -1 tablet (25-50 mg total) by mouth at bedtime as needed for sleep. 30 tablet 1   Ubrogepant (UBRELVY) 100 MG TABS Take 1 tablet by mouth as needed. For migraine rescue. 9 tablet 5   valACYclovir (VALTREX) 1000 MG tablet TAKE 1 TABLET BY MOUTH 2 TIMES DAILY 14 tablet 11   Current Facility-Administered Medications  Medication Dose Route Frequency Provider Last Rate Last Admin   0.9 %  sodium chloride infusion  500 mL Intravenous Once Ladene Artist, MD        Allergies as of 10/23/2021 - Review Complete 10/23/2021  Allergen Reaction Noted   Influenza vaccines Hives and Rash 10/02/2015   Vyvanse [lisdexamfetamine dimesylate]  09/01/2018   Wellbutrin [bupropion] Other (See Comments) 08/08/2017   Hydrocodone-acetaminophen Rash 10/23/2020    Family History  Problem Relation Age of Onset   Diabetes Mother    Hypertension Mother    Kidney disease Mother    CAD Father    Heart attack Father        died from MI at the age of 59   CAD Sister        stents placed at age 76   Breast cancer Neg Hx    Colon cancer Neg Hx    Colon polyps Neg Hx    Esophageal cancer Neg Hx    Stomach cancer Neg Hx    Rectal cancer Neg Hx     Social History   Socioeconomic History   Marital status: Divorced    Spouse name: Not on file   Number of children: 3   Years of education: Not on file   Highest education level: 11th grade  Occupational History   Occupation: Research scientist (life sciences): [employer]  Tobacco Use   Smoking status: Every Day    Packs/day: 0.25    Years: 6.00    Pack years: 1.50    Types:  Cigarettes   Smokeless tobacco: Never   Tobacco comments:    4-5 cigs/day  Vaping Use   Vaping Use: Never used  Substance and Sexual Activity   Alcohol use: Yes    Comment: occasionally   Drug use: No   Sexual activity: Not Currently    Birth control/protection: Surgical, Pill    Comment: tubal ligation  Other Topics Concern   Not on file  Social History Narrative   Not on file   Social Determinants of Health   Financial Resource Strain: Not on file  Food Insecurity: Not on file  Transportation Needs: Not on file  Physical Activity: Not on file  Stress: Not on file  Social Connections: Not on file  Intimate Partner Violence: Not on file    Review of Systems:  All systems reviewed an negative except where noted in HPI.  Gen: Denies any fever, chills, sweats, anorexia, fatigue, weakness, malaise, weight loss, and sleep disorder CV: Denies chest pain, angina, palpitations, syncope, orthopnea, PND, peripheral edema, and claudication. Resp: Denies dyspnea at rest, dyspnea with exercise, cough, sputum, wheezing, coughing up blood, and pleurisy. GI: Denies vomiting blood, jaundice, and fecal incontinence.   Denies dysphagia or odynophagia. GU : Denies urinary burning, blood in urine, urinary frequency, urinary hesitancy, nocturnal urination, and urinary incontinence. MS: Denies joint pain, limitation of movement, and swelling, stiffness, low back pain, extremity pain. Denies muscle weakness, cramps, atrophy.  Derm: Denies rash, itching, dry skin, hives, moles, warts, or unhealing ulcers.  Psych: Denies depression, anxiety, memory loss, suicidal ideation, hallucinations, paranoia, and confusion. Heme: Denies bruising, bleeding, and enlarged lymph nodes. Neuro:  Denies any headaches, dizziness, paresthesias. Endo:  Denies any problems with DM, thyroid, adrenal function.   Physical Exam: General:  Alert, well-developed, in NAD Head:  Normocephalic and atraumatic. Eyes:  Sclera  clear, no icterus.   Conjunctiva pink. Ears:  Normal auditory acuity. Mouth:  No deformity or lesions.  Neck:  Supple; no masses . Lungs:  Clear throughout to auscultation.   No wheezes, crackles, or rhonchi. No acute distress. Heart:  Regular rate and rhythm; no murmurs. Abdomen:  Soft, nondistended, nontender. No masses, hepatomegaly. No obvious masses.  Normal bowel .    Rectal:  Deferred   Msk:  Symmetrical without gross deformities.. Pulses:  Normal pulses noted. Extremities:  Without edema. Neurologic:  Alert and  oriented x4;  grossly normal neurologically. Skin:  Intact without significant lesions or rashes. Cervical Nodes:  No significant cervical adenopathy. Psych:  Alert and cooperative. Normal mood and affect.    Impression / Plan:   CRC screening, average risk, for colonoscopy.   Pricilla Riffle. Fuller Plan  10/23/2021, 2:19 PM See Shea Evans, [facility], to contact our on call provider

## 2021-10-23 NOTE — Progress Notes (Signed)
Report to PACU, RN, vss, BBS= Clear.  

## 2021-10-23 NOTE — Progress Notes (Signed)
VS by DT  Pt's states no medical or surgical changes since previsit or office visit.  

## 2021-10-23 NOTE — Patient Instructions (Signed)
Resume previous diet and medications. ° °Repeat colonoscopy in 10 years for screening purposes. ° ° ° °YOU HAD AN ENDOSCOPIC PROCEDURE TODAY AT THE [facility]:   Refer to the procedure report that was given to you for any specific questions about what was found during the examination.  If the procedure report does not answer your questions, please call your gastroenterologist to clarify.  If you requested that your care partner not be given the details of your procedure findings, then the procedure report has been included in a sealed envelope for you to review at your convenience later. ° °YOU SHOULD EXPECT: Some feelings of bloating in the abdomen. Passage of more gas than usual.  Walking can help get rid of the air that was put into your GI tract during the procedure and reduce the bloating. If you had a lower endoscopy (such as a colonoscopy or flexible sigmoidoscopy) you may notice spotting of blood in your stool or on the toilet paper. If you underwent a bowel prep for your procedure, you may not have a normal bowel movement for a few days. ° °Please Note:  You might notice some irritation and congestion in your nose or some drainage.  This is from the oxygen used during your procedure.  There is no need for concern and it should clear up in a day or so. ° °SYMPTOMS TO REPORT IMMEDIATELY: ° °Following lower endoscopy (colonoscopy or flexible sigmoidoscopy): ° Excessive amounts of blood in the stool ° Significant tenderness or worsening of abdominal pains ° Swelling of the abdomen that is new, acute ° Fever of 100°F or higher ° ° °For urgent or emergent issues, a gastroenterologist can be reached at any hour by calling [phone]. °Do not use MyChart messaging for urgent concerns.  ° ° °DIET:  We do recommend a small meal at first, but then you may proceed to your regular diet.  Drink plenty of fluids but you should avoid alcoholic beverages for 24 hours. ° °ACTIVITY:  You should plan to take  it easy for the rest of today and you should NOT DRIVE or use heavy machinery until tomorrow (because of the sedation medicines used during the test).   ° °FOLLOW UP: °Our staff will call the number listed on your records 48-72 hours following your procedure to check on you and address any questions or concerns that you may have regarding the information given to you following your procedure. If we do not reach you, we will leave a message.  We will attempt to reach you two times.  During this call, we will ask if you have developed any symptoms of COVID 19. If you develop any symptoms (ie: fever, flu-like symptoms, shortness of breath, cough etc.) before then, please call [phone].  If you test positive for Covid 19 in the 2 weeks post procedure, please call and report this information to us.   ° °If any biopsies were taken you will be contacted by phone or by letter within the next 1-3 weeks.  Please call us at [phone] if you have not heard about the biopsies in 3 weeks.  ° ° °SIGNATURES/CONFIDENTIALITY: °You and/or your care partner have signed paperwork which will be entered into your electronic medical record.  These signatures attest to the fact that that the information above on your After Visit Summary has been reviewed and is understood.  Full responsibility of the confidentiality of this discharge information lies with you and/or your care-partner.  °

## 2021-10-25 ENCOUNTER — Telehealth: Payer: Self-pay | Admitting: *Deleted

## 2021-10-25 NOTE — Telephone Encounter (Signed)
  Follow up Call-  Call back number 10/23/2021  Post procedure Call Back phone  # [phone]  Permission to leave phone message Yes  Some recent data might be hidden     Patient questions:  Do you have a fever, pain , or abdominal swelling? No. Pain Score  0 *  Have you tolerated food without any problems? Yes.    Have you been able to return to your normal activities? Yes.    Do you have any questions about your discharge instructions: Diet   No. Medications  No. Follow up visit  No.  Do you have questions or concerns about your Care? No.  Actions: * If pain score is 4 or above: No action needed, pain <4.   Have you developed a fever since your procedure? no  2.   Have you had an respiratory symptoms (SOB or cough) since your procedure? no  3.   Have you tested positive for COVID 19 since your procedure no  4.   Have you had any family members/close contacts diagnosed with the COVID 19 since your procedure?  no   If yes to any of these questions please route to Joylene John, RN and Joella Prince, RN

## 2021-10-29 ENCOUNTER — Ambulatory Visit: Payer: BLUE CROSS/BLUE SHIELD | Admitting: Professional

## 2021-11-15 ENCOUNTER — Other Ambulatory Visit: Payer: Self-pay | Admitting: Physician Assistant

## 2021-11-15 DIAGNOSIS — Z1231 Encounter for screening mammogram for malignant neoplasm of breast: Secondary | ICD-10-CM

## 2021-11-19 ENCOUNTER — Ambulatory Visit (INDEPENDENT_AMBULATORY_CARE_PROVIDER_SITE_OTHER): Payer: BLUE CROSS/BLUE SHIELD | Admitting: Obstetrics & Gynecology

## 2021-11-19 ENCOUNTER — Other Ambulatory Visit: Payer: Self-pay

## 2021-11-19 ENCOUNTER — Other Ambulatory Visit (HOSPITAL_COMMUNITY)
Admission: RE | Admit: 2021-11-19 | Discharge: 2021-11-19 | Disposition: A | Discharge status: Home / Self Care | Payer: BLUE CROSS/BLUE SHIELD | Source: Ambulatory Visit | Attending: Obstetrics & Gynecology | Admitting: Obstetrics & Gynecology

## 2021-11-19 ENCOUNTER — Encounter: Payer: Self-pay | Admitting: Obstetrics & Gynecology

## 2021-11-19 VITALS — BP 135/88 | HR 101 | Ht 62.0 in | Wt 179.0 lb

## 2021-11-19 DIAGNOSIS — R300 Dysuria: Secondary | ICD-10-CM

## 2021-11-19 DIAGNOSIS — N898 Other specified noninflammatory disorders of vagina: Secondary | ICD-10-CM | POA: Insufficient documentation

## 2021-11-19 LAB — POCT URINALYSIS DIPSTICK
Bilirubin, UA: NEGATIVE
Glucose, UA: NEGATIVE
Ketones, UA: NEGATIVE
Leukocytes, UA: NEGATIVE
Nitrite, UA: NEGATIVE
Protein, UA: POSITIVE — AB
Spec Grav, UA: >=1.03 — AB (ref 1.010–1.025)
Urobilinogen, UA: NEGATIVE E.U./dL — AB
pH, UA: 7 (ref 5.0–8.0)

## 2021-11-19 NOTE — Progress Notes (Signed)
   Subjective:    Patient ID: Kelsey Vaughan, female    DOB: 15-Jun-1976, 45 y.o.   MRN: [id number]  HPI  45 yo [id number] female presents with discharge, itching, and a swelling on the right side.  Pt's symptoms began shortly after having intercourse with partner last Thursday.  Lst time she had intercourse with him before Thursday was 3 weeks ago.  He works Architect out of town.  To her knowledge, their relationship is monogamous and she also would like to be sure she is clear of STDs.  Pt is also having burning with urination.  It seems to occur when urine hits the vulvar skin.  No bladder pain or cramps.    Review of Systems  Constitutional: Negative.   Respiratory: Negative.    Cardiovascular: Negative.   Gastrointestinal: Negative.   Genitourinary:  Positive for pelvic pain and vaginal discharge.      Objective:   Physical Exam Vitals reviewed.  Constitutional:      General: She is not in acute distress.    Appearance: She is well-developed.  HENT:     Head: Normocephalic and atraumatic.  Eyes:     Conjunctiva/sclera: Conjunctivae normal.  Cardiovascular:     Rate and Rhythm: Normal rate.  Pulmonary:     Effort: Pulmonary effort is normal.  Genitourinary:    Comments: Tanner V Vulva:  No lesion Vagina:  Pink, no lesions, + white discharge, no blood Cervix:  No CMT Uterus:  Non tender, mobile Right adnexa--non tender, no mass Left adnexa--non tender, no mass Skin:    General: Skin is warm and dry.  Neurological:     Mental Status: She is alert and oriented to person, place, and time.  Psychiatric:        Mood and Affect: Mood normal.   Vitals:   11/19/21 1512  BP: 135/88  Pulse: (!) 101  Weight: 179 lb (81.2 kg)  Height: $Remove'5\' 2"'ubPwKlO$  (1.575 m)      Assessment & Plan:  45 yo female with vaginal discharge and burning on urination.   Vaginitis and STD testing. UA and C ux If right sided pain continues for several weeks; will order a TVUS.   Pt needs well woman  exam.  (Pap nml in 08/2019)  30 minutes spent with patient during exam as well as counseling, review of record and documentation.

## 2021-11-20 LAB — CERVICOVAGINAL ANCILLARY ONLY
Bacterial Vaginitis (gardnerella): POSITIVE — AB
Candida Glabrata: NEGATIVE
Candida Vaginitis: NEGATIVE
Chlamydia: NEGATIVE
Comment: NEGATIVE
Comment: NEGATIVE
Comment: NEGATIVE
Comment: NEGATIVE
Comment: NEGATIVE
Comment: NORMAL
Neisseria Gonorrhea: NEGATIVE
Trichomonas: NEGATIVE

## 2021-11-21 LAB — URINE CULTURE
MICRO NUMBER:: 12716990
SPECIMEN QUALITY:: ADEQUATE

## 2021-11-22 ENCOUNTER — Other Ambulatory Visit: Payer: Self-pay | Admitting: Obstetrics & Gynecology

## 2021-11-22 ENCOUNTER — Other Ambulatory Visit (HOSPITAL_BASED_OUTPATIENT_CLINIC_OR_DEPARTMENT_OTHER): Payer: Self-pay

## 2021-11-22 MED ORDER — METRONIDAZOLE 500 MG PO TABS
500.0000 mg | ORAL_TABLET | Freq: Two times a day (BID) | ORAL | 0 refills | Status: DC
  Filled 2021-11-22: qty 14, 7d supply, fill #0

## 2021-11-22 NOTE — Progress Notes (Signed)
Flagyl for BV

## 2021-11-23 ENCOUNTER — Other Ambulatory Visit (HOSPITAL_BASED_OUTPATIENT_CLINIC_OR_DEPARTMENT_OTHER): Payer: Self-pay

## 2021-11-30 ENCOUNTER — Other Ambulatory Visit: Payer: Self-pay

## 2021-11-30 ENCOUNTER — Ambulatory Visit (INDEPENDENT_AMBULATORY_CARE_PROVIDER_SITE_OTHER): Payer: BLUE CROSS/BLUE SHIELD | Admitting: Sports Medicine

## 2021-11-30 DIAGNOSIS — M47816 Spondylosis without myelopathy or radiculopathy, lumbar region: Secondary | ICD-10-CM

## 2021-11-30 NOTE — Assessment & Plan Note (Signed)
Kelsey Vaughan returns, she is a pleasant 45 year old female, she has chronic axial low back pain, we have traced her pain to her left L5-S1 facet joint, the initial injection earlier this year provided fantastic relief, a second injection a couple months ago did not provide sufficient relief for sufficient duration of time. She understands that the next step is going to be medial branch blocks and radiofrequency ablation of the left L5-S1 facet for longer term relief. Of note discs looked okay on her MRI. Return to see me approximately 1 month after the ablation.

## 2021-11-30 NOTE — Progress Notes (Signed)
° ° °  Procedures performed today:    None.  Independent interpretation of notes and tests performed by another provider:   None.  Brief History, Exam, Impression, and Recommendations:    Lumbar facet arthropathy Kelsey Vaughan returns, she is a pleasant 45 year old female, she has chronic axial low back pain, we have traced her pain to her left L5-S1 facet joint, the initial injection earlier this year provided fantastic relief, a second injection a couple months ago did not provide sufficient relief for sufficient duration of time. She understands that the next step is going to be medial branch blocks and radiofrequency ablation of the left L5-S1 facet for longer term relief. Of note discs looked okay on her MRI. Return to see me approximately 1 month after the ablation.    ___________________________________________ Gwen Her. Dianah Field, M.D., ABFM., CAQSM. Primary Care and [place] [employer] of [employer] [facility] [employer] [facility]

## 2021-12-03 ENCOUNTER — Other Ambulatory Visit: Payer: Self-pay | Admitting: Physician Assistant

## 2021-12-03 ENCOUNTER — Other Ambulatory Visit (HOSPITAL_BASED_OUTPATIENT_CLINIC_OR_DEPARTMENT_OTHER): Payer: Self-pay

## 2021-12-03 MED ORDER — NIRMATRELVIR/RITONAVIR (PAXLOVID)TABLET
3.0000 | ORAL_TABLET | Freq: Two times a day (BID) | ORAL | 0 refills | Status: AC
  Filled 2021-12-03: qty 30, 5d supply, fill #0

## 2021-12-03 NOTE — Progress Notes (Signed)
Tested positive for covid yesterday 12/02/2021. Symptomatic since Saturday 12/17. Pt is obese with HTN. Discussed symptomatic care. Popted for paxlovid.

## 2021-12-21 ENCOUNTER — Ambulatory Visit: Payer: BLUE CROSS/BLUE SHIELD

## 2021-12-31 ENCOUNTER — Other Ambulatory Visit (HOSPITAL_BASED_OUTPATIENT_CLINIC_OR_DEPARTMENT_OTHER): Payer: Self-pay

## 2021-12-31 ENCOUNTER — Other Ambulatory Visit: Payer: Self-pay | Admitting: Physician Assistant

## 2021-12-31 DIAGNOSIS — Z3009 Encounter for other general counseling and advice on contraception: Secondary | ICD-10-CM

## 2021-12-31 DIAGNOSIS — G4452 New daily persistent headache (NDPH): Secondary | ICD-10-CM

## 2021-12-31 MED ORDER — DESOGESTREL-ETHINYL ESTRADIOL 0.15-30 MG-MCG PO TABS
1.0000 | ORAL_TABLET | Freq: Every day | ORAL | 1 refills | Status: DC
  Filled 2021-12-31 – 2022-02-11 (×2): qty 84, 84d supply, fill #0
  Filled 2022-05-08: qty 84, 84d supply, fill #1

## 2021-12-31 MED ORDER — SUMATRIPTAN SUCCINATE 100 MG PO TABS
ORAL_TABLET | ORAL | 5 refills | Status: DC
  Filled 2021-12-31: qty 9, 30d supply, fill #0
  Filled 2022-09-25: qty 9, 30d supply, fill #1

## 2021-12-31 MED FILL — Valacyclovir HCl Tab 1 GM: ORAL | 7 days supply | Qty: 14 | Fill #0 | Status: AC

## 2022-01-01 ENCOUNTER — Ambulatory Visit: Payer: BC Managed Care – PPO | Admitting: Obstetrics and Gynecology

## 2022-01-02 ENCOUNTER — Telehealth: Payer: Self-pay | Admitting: Physician Assistant

## 2022-01-02 MED ORDER — LORAZEPAM 0.5 MG PO TABS: 0.5000 mg | ORAL_TABLET | Freq: Three times a day (TID) | ORAL | 0 refills | Status: DC | PRN

## 2022-01-02 NOTE — Telephone Encounter (Signed)
Made appt for Monday.  A few episodes of vision changes, increase in heart rate, palpitations, panic over the past few weeks. BP running 120s over 60. HR in the 110's. No CP or SOB.   Sent ativan to take as needed for panic. Follow up on Monday.

## 2022-01-03 ENCOUNTER — Other Ambulatory Visit (HOSPITAL_BASED_OUTPATIENT_CLINIC_OR_DEPARTMENT_OTHER): Payer: Self-pay

## 2022-01-07 ENCOUNTER — Encounter: Payer: Self-pay | Admitting: Physician Assistant

## 2022-01-07 ENCOUNTER — Other Ambulatory Visit (HOSPITAL_BASED_OUTPATIENT_CLINIC_OR_DEPARTMENT_OTHER): Payer: Self-pay

## 2022-01-07 ENCOUNTER — Ambulatory Visit (INDEPENDENT_AMBULATORY_CARE_PROVIDER_SITE_OTHER): Payer: BC Managed Care – PPO | Admitting: Physician Assistant

## 2022-01-07 ENCOUNTER — Other Ambulatory Visit: Payer: Self-pay

## 2022-01-07 VITALS — BP 127/70 | HR 89 | Ht 62.0 in | Wt 177.0 lb

## 2022-01-07 DIAGNOSIS — J019 Acute sinusitis, unspecified: Secondary | ICD-10-CM | POA: Insufficient documentation

## 2022-01-07 DIAGNOSIS — R55 Syncope and collapse: Secondary | ICD-10-CM | POA: Insufficient documentation

## 2022-01-07 DIAGNOSIS — H539 Unspecified visual disturbance: Secondary | ICD-10-CM | POA: Diagnosis not present

## 2022-01-07 MED ORDER — AMOXICILLIN-POT CLAVULANATE 875-125 MG PO TABS
1.0000 | ORAL_TABLET | Freq: Two times a day (BID) | ORAL | 0 refills | Status: DC
Start: 2022-01-07 — End: 2022-01-15
  Filled 2022-01-07: qty 20, 10d supply, fill #0

## 2022-01-07 NOTE — Progress Notes (Signed)
Subjective:     Patient ID: Kelsey Vaughan, female   DOB: 1976/02/24, 46 y.o.   MRN: [id number]  Palpitations  Associated symptoms include anxiety. Pertinent negatives include no chest pain, coughing or shortness of breath.  46 YO female presents to the clinic complaining of episodes of vision changes and palpitations. Patient reports she had covid in the middle of December and started noticing these symptoms in the beginning of January. Every episode starts with vision changes consisting of vision loss, double vision and white specs coming down into her visual field. Then she feels presyncopal and dizzy but she denies passing out. She admits to intermittent associated headaches and diaphoresis. She does report that these episodes happen daily and more frequently when she is driving. They have also occurred when she was resting in bed and she felt extreme dizziness when she sat up in bed. Patient reports she has been taking her bystolic but does not think this has been helping. She also tried ativan and imitrex during these episodes but this has not helped. She also admits to some sinus congestion.   Review of Systems  HENT:  Positive for congestion, ear pain and sinus pressure. Negative for hearing loss, sore throat and trouble swallowing.   Eyes:  Positive for photophobia and visual disturbance. Negative for pain, discharge and redness.  Respiratory:  Negative for cough and shortness of breath.   Cardiovascular:  Positive for palpitations. Negative for chest pain.  Psychiatric/Behavioral:  The patient is nervous/anxious.       Objective:   Physical Exam Constitutional:      General: She is not in acute distress.    Appearance: Normal appearance. She is obese. She is not ill-appearing, toxic-appearing or diaphoretic.  HENT:     Head: Normocephalic and atraumatic.     Right Ear: Tympanic membrane, ear canal and external ear normal.     Left Ear: Tympanic membrane, ear canal and external ear  normal.     Nose:     Right Turbinates: Swollen.     Left Turbinates: Swollen.     Comments: Bilateral turbinates erythematous and swollen.     Mouth/Throat:     Mouth: Mucous membranes are moist.     Pharynx: No oropharyngeal exudate.  Eyes:     Extraocular Movements: Extraocular movements intact.     Conjunctiva/sclera: Conjunctivae normal.     Pupils: Pupils are equal, round, and reactive to light.  Cardiovascular:     Rate and Rhythm: Normal rate and regular rhythm.     Pulses: Normal pulses.     Heart sounds: Normal heart sounds.  Pulmonary:     Effort: Pulmonary effort is normal.     Breath sounds: Normal breath sounds.  Musculoskeletal:        General: Normal range of motion.     Cervical back: Normal range of motion.  Skin:    General: Skin is warm and dry.  Neurological:     Mental Status: She is alert and oriented to person, place, and time.  Psychiatric:        Mood and Affect: Mood normal.        Behavior: Behavior normal.        Thought Content: Thought content normal.        Judgment: Judgment normal.  Negative Dix Hallpike     Assessment:      [name] KitchenMarland KitchenEllenora was seen today for palpitations.  Diagnoses and all orders for this visit:  Near syncope -  EKG 12-Lead  Vision changes -     EKG 12-Lead  Acute sinusitis, recurrence not specified, unspecified location -     amoxicillin-clavulanate (AUGMENTIN) 875-125 MG tablet; Take 1 tablet by mouth 2 (two) times daily for 10 days.     Obtain EKG in office. No changes from previous EKG 06/14/20.   Start augmentin to treat probable sinusitis since looking back in chart she had similar symptoms in 2019 where MRI was obtained and ended up being sinusitis. Symptoms resolved with abx.   Continue taking migraine medication as prescribed as needed.   Follow up if symptoms worsen or are not relieved by augmentin.

## 2022-01-07 NOTE — Patient Instructions (Signed)

## 2022-01-08 ENCOUNTER — Encounter: Payer: Self-pay | Admitting: Physician Assistant

## 2022-01-15 ENCOUNTER — Other Ambulatory Visit: Payer: Self-pay

## 2022-01-15 ENCOUNTER — Ambulatory Visit (INDEPENDENT_AMBULATORY_CARE_PROVIDER_SITE_OTHER): Payer: BC Managed Care – PPO | Admitting: Obstetrics and Gynecology

## 2022-01-15 ENCOUNTER — Encounter: Payer: Self-pay | Admitting: Obstetrics and Gynecology

## 2022-01-15 ENCOUNTER — Other Ambulatory Visit (HOSPITAL_COMMUNITY)
Admission: RE | Admit: 2022-01-15 | Discharge: 2022-01-15 | Disposition: A | Discharge status: Home / Self Care | Payer: BC Managed Care – PPO | Source: Ambulatory Visit | Attending: Obstetrics and Gynecology | Admitting: Obstetrics and Gynecology

## 2022-01-15 VITALS — BP 113/64 | HR 102 | Resp 16 | Ht 62.0 in | Wt 178.0 lb

## 2022-01-15 DIAGNOSIS — Z01419 Encounter for gynecological examination (general) (routine) without abnormal findings: Secondary | ICD-10-CM | POA: Diagnosis not present

## 2022-01-15 DIAGNOSIS — Z0000 Encounter for general adult medical examination without abnormal findings: Secondary | ICD-10-CM

## 2022-01-15 DIAGNOSIS — Z3041 Encounter for surveillance of contraceptive pills: Secondary | ICD-10-CM | POA: Diagnosis not present

## 2022-01-15 NOTE — Progress Notes (Signed)
Last pap 9/20

## 2022-01-15 NOTE — Progress Notes (Signed)
GYNECOLOGY ANNUAL PREVENTATIVE CARE ENCOUNTER NOTE  History:     Kelsey Vaughan is a 46 y.o. [id number] female here for a routine annual gynecologic exam.  Current complaints: None.    Denies abnormal vaginal bleeding, discharge, pelvic pain, problems with intercourse or other gynecologic concerns.  Left upper breast tenderness. No skin changes, no lumps or bumps. Tenderness x 1 week. Has not tried any heat or ice.    Gynecologic History Patient's last menstrual period was 12/15/2021. Contraception: BC pills Juleber  Last Pap: 2019, Result was normal with negative HPV Last Mammogram: 2021.   Result was normal   Obstetric History OB History  Gravida Para Term Preterm AB Living  3 3 3     3   SAB IAB Ectopic Multiple Live Births               # Outcome Date GA Lbr Len/2nd Weight Sex Delivery Anes PTL Lv  3 Term           2 Term           1 Term             Past Medical History:  Diagnosis Date   Allergy    Anxiety    Arthritis    Atrial fibrillation ([place])    a. occuring in 02/2016   B12 deficiency    Complication of anesthesia    Constipation    Foreign body in foot, left 07/2013   GERD (gastroesophageal reflux disease)    Hematuria    Irregular menses    due to oral contraceptive   Palpitations    a. 01/2017: Event monitor showing SR with PVC's.    PONV (postoperative nausea and vomiting)    Sinus tachycardia    Thyroid nodule    Vitamin D deficiency     Past Surgical History:  Procedure Laterality Date   CESAREAN SECTION     FOREIGN BODY REMOVAL Left 08/05/2013   Procedure: LEFT FOOT FOREIGN BODY REMOVAL ADULT;  Surgeon: Wylene Simmer, MD;  Location: [facility];  Service: Orthopedics;  Laterality: Left;   SHOULDER ARTHROSCOPY Bilateral    TUBAL LIGATION      Current Outpatient Medications on File Prior to Visit  Medication Sig Dispense Refill   aspirin EC 81 MG tablet Take 1 tablet (81 mg total) by mouth daily.     cariprazine (VRAYLAR)  1.5 MG capsule Take 1 capsule (1.5 mg total) by mouth daily. 30 capsule 1   desogestrel-ethinyl estradiol (JULEBER) 0.15-30 MG-MCG tablet Take 1 tablet by mouth daily. 84 tablet 1   levocetirizine (XYZAL) 5 MG tablet Take 1 tablet (5 mg total) by mouth every evening. 90 tablet 1   LORazepam (ATIVAN) 0.5 MG tablet Take 1 tablet (0.5 mg total) by mouth every 8 (eight) hours as needed for anxiety. 20 tablet 0   mometasone (NASONEX) 50 MCG/ACT nasal spray One spray in each nostril twice a day, use left hand for right nostril, and right hand for left nostril.  Please dispense one bottle. 1 g 6   nebivolol (BYSTOLIC) 5 MG tablet Take 1 tablet (5 mg total) by mouth in the morning and at bedtime. 180 tablet 3   SUMAtriptan (IMITREX) 100 MG tablet TAKE 1 TABLET (100 MG TOTAL) BY MOUTH ONCE AS NEEDED FOR UP TO 1 DOSE FOR MIGRAINE. MAY REPEAT IN 2 HOURS IF HEADACHE PERSISTS OR RECURS. 10 tablet 5   traZODone (DESYREL) 50 MG tablet Take 1/2 -1  tablet (25-50 mg total) by mouth at bedtime as needed for sleep. 30 tablet 1   Ubrogepant (UBRELVY) 100 MG TABS Take 1 tablet by mouth as needed. For migraine rescue. 9 tablet 5   Current Facility-Administered Medications on File Prior to Visit  Medication Dose Route Frequency Provider Last Rate Last Admin   0.9 %  sodium chloride infusion  500 mL Intravenous Once Ladene Artist, MD        Allergies  Allergen Reactions   Influenza Vaccines Hives and Rash   Vyvanse [Lisdexamfetamine Dimesylate]     Headache/mood changes.    Wellbutrin [Bupropion] Other (See Comments)    Possible nipple soreness/headaches/not feeling right   Hydrocodone-Acetaminophen Rash    Social History:  reports that she has been smoking cigarettes. She has a 1.50 pack-year smoking history. She has never used smokeless tobacco. She reports current alcohol use. She reports that she does not use drugs.  Family History  Problem Relation Age of Onset   Diabetes Mother    Hypertension Mother     Kidney disease Mother    CAD Father    Heart attack Father        died from MI at the age of 55   CAD Sister        stents placed at age 49   Breast cancer Neg Hx    Colon cancer Neg Hx    Colon polyps Neg Hx    Esophageal cancer Neg Hx    Stomach cancer Neg Hx    Rectal cancer Neg Hx     The following portions of the patient's history were reviewed and updated as appropriate: allergies, current medications, past family history, past medical history, past social history, past surgical history and problem list.  Review of Systems Pertinent items noted in HPI and remainder of comprehensive ROS otherwise negative.  Physical Exam:  BP 113/64    Pulse (!) 102    Resp 16    Ht 5\' 2"  (1.575 m)    Wt 178 lb (80.7 kg)    LMP 12/15/2021    BMI 32.56 kg/m  CONSTITUTIONAL: Well-developed, well-nourished female in no acute distress.  HENT:  Normocephalic, atraumatic, External right and left ear normal.  EYES: Conjunctivae and EOM are normal. Pupils are equal, round, and reactive to light. No scleral icterus.  NECK: Normal range of motion, supple, no masses.  Normal thyroid.  SKIN: Skin is warm and dry. No rash noted. Not diaphoretic. No erythema. No pallor. MUSCULOSKELETAL: Normal range of motion. No tenderness.  No cyanosis, clubbing, or edema. NEUROLOGIC: Alert and oriented to person, place, and time. Normal reflexes, muscle tone coordination.  PSYCHIATRIC: Normal mood and affect. Normal behavior. Normal judgment and thought content. CARDIOVASCULAR: Normal heart rate noted, regular rhythm RESPIRATORY: Clear to auscultation bilaterally. Effort and breath sounds normal, no problems with respiration noted. BREASTS: Symmetric in size. No masses,  skin changes, nipple drainage, or lymphadenopathy bilaterally. Performed in the presence of a chaperone. + left upper breast pain at 12:00, no erythema.  ABDOMEN: Soft, no distention noted.  No tenderness, rebound or guarding.  PELVIC: Normal appearing  external genitalia and urethral meatus; normal appearing vaginal mucosa and cervix.  No abnormal vaginal discharge noted.  Pap smear obtained.  Normal uterine size, no other palpable masses, no uterine or adnexal tenderness.  Performed in the presence of a chaperone.   Assessment and Plan:   1. Annual physical exam  - MM Digital Screening; Future - Cytology -  PAP( [place])  - If left breast tenderness continues, will consider breast US. Recommend heat/ice.    Will follow up results of pap smear and manage accordingly. Mammogram scheduled Routine preventative health maintenance measures emphasized. Please refer to After Visit Summary for other counseling recommendations.      Easton Fetty, Artist Pais, [place] for [employer], [place]

## 2022-01-17 LAB — CYTOLOGY - PAP
Adequacy: ABSENT
Comment: NEGATIVE
High risk HPV: NEGATIVE

## 2022-01-30 ENCOUNTER — Other Ambulatory Visit: Payer: Self-pay

## 2022-01-30 ENCOUNTER — Ambulatory Visit: Payer: BC Managed Care – PPO

## 2022-01-30 ENCOUNTER — Ambulatory Visit (INDEPENDENT_AMBULATORY_CARE_PROVIDER_SITE_OTHER): Payer: BC Managed Care – PPO

## 2022-01-30 DIAGNOSIS — Z1231 Encounter for screening mammogram for malignant neoplasm of breast: Secondary | ICD-10-CM | POA: Diagnosis not present

## 2022-01-30 NOTE — Progress Notes (Signed)
Normal mammogram. Follow up in 1 year.

## 2022-02-11 ENCOUNTER — Other Ambulatory Visit (HOSPITAL_BASED_OUTPATIENT_CLINIC_OR_DEPARTMENT_OTHER): Payer: Self-pay

## 2022-02-12 ENCOUNTER — Other Ambulatory Visit (HOSPITAL_BASED_OUTPATIENT_CLINIC_OR_DEPARTMENT_OTHER): Payer: Self-pay

## 2022-02-22 ENCOUNTER — Other Ambulatory Visit: Payer: Self-pay | Admitting: Neurology

## 2022-02-22 MED ORDER — FLUCONAZOLE 150 MG PO TABS: 150.0000 mg | ORAL_TABLET | Freq: Once | ORAL | 0 refills | Status: AC

## 2022-02-22 NOTE — Telephone Encounter (Signed)
Per Luvenia Starch to send Diflucan #2 to pharmacy. RX sent.  ?

## 2022-03-04 ENCOUNTER — Other Ambulatory Visit (HOSPITAL_BASED_OUTPATIENT_CLINIC_OR_DEPARTMENT_OTHER): Payer: Self-pay

## 2022-03-04 MED ORDER — METHOCARBAMOL 500 MG PO TABS
ORAL_TABLET | ORAL | 2 refills | Status: DC
  Filled 2022-03-04: qty 60, 8d supply, fill #0

## 2022-03-13 ENCOUNTER — Other Ambulatory Visit (HOSPITAL_BASED_OUTPATIENT_CLINIC_OR_DEPARTMENT_OTHER): Payer: Self-pay

## 2022-04-08 ENCOUNTER — Other Ambulatory Visit (HOSPITAL_BASED_OUTPATIENT_CLINIC_OR_DEPARTMENT_OTHER): Payer: Self-pay

## 2022-05-08 ENCOUNTER — Other Ambulatory Visit (HOSPITAL_BASED_OUTPATIENT_CLINIC_OR_DEPARTMENT_OTHER): Payer: Self-pay

## 2022-05-09 ENCOUNTER — Other Ambulatory Visit (HOSPITAL_BASED_OUTPATIENT_CLINIC_OR_DEPARTMENT_OTHER): Payer: Self-pay

## 2022-07-22 ENCOUNTER — Other Ambulatory Visit (HOSPITAL_BASED_OUTPATIENT_CLINIC_OR_DEPARTMENT_OTHER): Payer: Self-pay

## 2022-07-22 ENCOUNTER — Ambulatory Visit (INDEPENDENT_AMBULATORY_CARE_PROVIDER_SITE_OTHER): Payer: BC Managed Care – PPO | Admitting: Physician Assistant

## 2022-07-22 ENCOUNTER — Encounter: Payer: Self-pay | Admitting: Physician Assistant

## 2022-07-22 VITALS — BP 123/71 | HR 106 | Ht 62.0 in | Wt 184.0 lb

## 2022-07-22 DIAGNOSIS — F410 Panic disorder [episodic paroxysmal anxiety] without agoraphobia: Secondary | ICD-10-CM

## 2022-07-22 DIAGNOSIS — H538 Other visual disturbances: Secondary | ICD-10-CM

## 2022-07-22 DIAGNOSIS — E785 Hyperlipidemia, unspecified: Secondary | ICD-10-CM

## 2022-07-22 DIAGNOSIS — Z79899 Other long term (current) drug therapy: Secondary | ICD-10-CM | POA: Diagnosis not present

## 2022-07-22 DIAGNOSIS — I479 Paroxysmal tachycardia, unspecified: Secondary | ICD-10-CM

## 2022-07-22 DIAGNOSIS — Z131 Encounter for screening for diabetes mellitus: Secondary | ICD-10-CM

## 2022-07-22 DIAGNOSIS — E559 Vitamin D deficiency, unspecified: Secondary | ICD-10-CM

## 2022-07-22 DIAGNOSIS — F411 Generalized anxiety disorder: Secondary | ICD-10-CM

## 2022-07-22 DIAGNOSIS — Z1329 Encounter for screening for other suspected endocrine disorder: Secondary | ICD-10-CM

## 2022-07-22 MED ORDER — BUSPIRONE HCL 7.5 MG PO TABS
7.5000 mg | ORAL_TABLET | Freq: Three times a day (TID) | ORAL | 1 refills | Status: DC
  Filled 2022-07-22: qty 90, 30d supply, fill #0

## 2022-07-22 MED ORDER — PAROXETINE HCL 10 MG PO TABS
10.0000 mg | ORAL_TABLET | Freq: Every day | ORAL | 1 refills | Status: DC
  Filled 2022-07-22: qty 30, 30d supply, fill #0
  Filled 2022-09-25: qty 30, 30d supply, fill #1

## 2022-07-22 MED ORDER — LORAZEPAM 0.5 MG PO TABS
0.5000 mg | ORAL_TABLET | Freq: Three times a day (TID) | ORAL | 0 refills | Status: DC | PRN
  Filled 2022-07-22: qty 20, 7d supply, fill #0
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
## 2022-07-22 NOTE — Patient Instructions (Addendum)
I do believe you are having panic attacks on top of generalized anxiety  Start paxil '10mg'$  daily  Buspar three times a day  And ativan as needed  Schedule eye doctor appt  Panic Attack A panic attack is when you suddenly feel very afraid, uncomfortable, or nervous (anxious). A panic attack can happen when you are scared, or it may happen for no reason. A panic attack can feel like a heart attack or stroke. See your doctor when you have a panic attack to make sure you are not having a heart attack or stroke. What are the causes? Experiencing things that threaten your life, such as a war. Feeling worried or nervous for a long time (anxiety disorder). Being sad (depressed). Panic disorder. Certain medical conditions. Other causes may include: Certain medicines. Taking certain supplements. Illegal drugs. What increases the risk? Having another mental health condition. Using alcohol or drugs. Being under a lot of stress. Having events in your life that cause worry and sadness. What are the signs or symptoms? A panic attack: Starts suddenly. May last 5-10 minutes. Symptoms include one or more of these: A pounding heart. A feeling that your heart is beating in an unusual way or faster than normal (palpitations). Sweating or shaking. Feeling short of breath. Chest pain. Feeling like you may vomit (nauseous). Feeling dizzy or like you might faint. Other symptoms may include: Chills or hot flashes. Numbness or tingling in your lips, hands, or feet. Feeling confused. Fear of losing control. Fear of dying. How is this treated? A panic attack is a symptom of another condition. Treatment depends on the cause of the panic attack. If the cause is a medical problem, your doctor will treat that problem or refer you to a specialist. If the cause is emotional, you may be given medicines or referred to a counselor. If the cause is a medicine, your doctor may tell you to stop the medicine,  change your dose, or take a different medicine. If the cause is an illegal drug, treatment may involve letting the drug wear off and taking medicine to help the drug leave your body or to stop its effects. Attacks caused by heavy drug use may continue even if you stop using the drug. Most panic attacks go away after the cause is treated. Follow these instructions at home: Alcohol use Do not drink alcohol if: Your doctor tells you not to drink. You are pregnant, may be pregnant, or are planning to become pregnant. If you drink alcohol: Limit how much you have to: 0-1 drink a day for women. 0-2 drinks a day for men. Know how much alcohol is in your drink. In the [place] one drink equals one 12 oz bottle of beer (355 mL), one 5 oz glass of wine (148 mL), or one 1 oz glass of hard liquor (44 mL). General instructions  Take over-the-counter and prescription medicines only as told by your doctor. If you feel worried or nervous, try not to have caffeine. Take good care of your health. To do this: Eat healthy. Make sure to eat fresh fruits and vegetables, whole grains, lean meats, and low-fat dairy. Get enough sleep. Try to sleep for 7-8 hours each night. Exercise. Try to be active for 30 minutes 5 or more days a week. Do not smoke or use any products that contain nicotine or tobacco. If you need help quitting, ask your doctor. Keep all follow-up visits. Where to find more information Substance Abuse and Ep[place] (  SAMHSA): SamedayNews.com.cy [facility]): https://carter.com/ Contact a doctor if: Your symptoms do not get better. Your symptoms get worse. You are not able to take your medicines as told. Get help right away if: You have thoughts of hurting yourself or others. Get help right away if you feel like you may hurt yourself or others, or have thoughts about taking your own life. Go to your nearest emergency room or: Call 911. Call the  [facility] at [phone] or 988. This is open 24 hours a day. Text the Crisis Text Line at 540-223-2343. Summary A panic attack is when you suddenly feel very afraid, uncomfortable, or nervous (anxious). See your doctor when you have a panic attack to make sure that you do not have another serious problem. If you feel like you may hurt yourself or others, get help right away. Call 911. This information is not intended to replace advice given to you by your health care provider. Make sure you discuss any questions you have with your health care provider. Document Revised: 07/12/2021 Document Reviewed: 07/12/2021 Elsevier Patient Education  [place].

## 2022-07-23 ENCOUNTER — Other Ambulatory Visit (HOSPITAL_BASED_OUTPATIENT_CLINIC_OR_DEPARTMENT_OTHER): Payer: Self-pay

## 2022-08-26 ENCOUNTER — Other Ambulatory Visit (HOSPITAL_BASED_OUTPATIENT_CLINIC_OR_DEPARTMENT_OTHER): Payer: Self-pay

## 2022-08-26 ENCOUNTER — Other Ambulatory Visit: Payer: Self-pay | Admitting: Physician Assistant

## 2022-08-26 DIAGNOSIS — Z3009 Encounter for other general counseling and advice on contraception: Secondary | ICD-10-CM

## 2022-08-26 MED ORDER — DESOGESTREL-ETHINYL ESTRADIOL 0.15-30 MG-MCG PO TABS
1.0000 | ORAL_TABLET | Freq: Every day | ORAL | 1 refills | Status: DC
  Filled 2022-08-26: qty 84, 84d supply, fill #0
  Filled 2023-01-08: qty 84, 84d supply, fill #1

## 2022-08-27 ENCOUNTER — Other Ambulatory Visit (HOSPITAL_BASED_OUTPATIENT_CLINIC_OR_DEPARTMENT_OTHER): Payer: Self-pay

## 2022-09-16 ENCOUNTER — Other Ambulatory Visit: Payer: Self-pay | Admitting: Orthopedic Surgery

## 2022-09-16 DIAGNOSIS — G8929 Other chronic pain: Secondary | ICD-10-CM

## 2022-09-25 ENCOUNTER — Other Ambulatory Visit (HOSPITAL_BASED_OUTPATIENT_CLINIC_OR_DEPARTMENT_OTHER): Payer: Self-pay

## 2022-10-01 ENCOUNTER — Ambulatory Visit
Admission: RE | Admit: 2022-10-01 | Discharge: 2022-10-01 | Disposition: A | Discharge status: Home / Self Care | Payer: Worker's Compensation | Source: Ambulatory Visit | Attending: Orthopedic Surgery | Admitting: Orthopedic Surgery

## 2022-10-01 ENCOUNTER — Ambulatory Visit
Admission: RE | Admit: 2022-10-01 | Discharge: 2022-10-01 | Disposition: A | Discharge status: Home / Self Care | Payer: Self-pay | Source: Ambulatory Visit | Attending: Orthopedic Surgery | Admitting: Orthopedic Surgery

## 2022-10-01 ENCOUNTER — Other Ambulatory Visit (HOSPITAL_COMMUNITY): Payer: Self-pay

## 2022-10-01 DIAGNOSIS — G8929 Other chronic pain: Secondary | ICD-10-CM

## 2022-10-01 MED ORDER — METHYLPREDNISOLONE ACETATE 40 MG/ML INJ SUSP (RADIOLOG: 120.0000 mg | Freq: Once | INTRAMUSCULAR | Status: DC

## 2022-10-07 ENCOUNTER — Other Ambulatory Visit (HOSPITAL_BASED_OUTPATIENT_CLINIC_OR_DEPARTMENT_OTHER): Payer: Self-pay

## 2022-10-31 ENCOUNTER — Other Ambulatory Visit (HOSPITAL_COMMUNITY): Payer: Self-pay

## 2023-01-08 ENCOUNTER — Other Ambulatory Visit: Payer: Self-pay | Admitting: Physician Assistant

## 2023-01-08 ENCOUNTER — Other Ambulatory Visit (HOSPITAL_BASED_OUTPATIENT_CLINIC_OR_DEPARTMENT_OTHER): Payer: Self-pay

## 2023-01-08 DIAGNOSIS — Z1231 Encounter for screening mammogram for malignant neoplasm of breast: Secondary | ICD-10-CM

## 2023-01-08 MED ORDER — IBUPROFEN 600 MG PO TABS
600.0000 mg | ORAL_TABLET | Freq: Four times a day (QID) | ORAL | 0 refills | Status: DC | PRN
  Filled 2023-01-08: qty 30, 8d supply, fill #0

## 2023-01-08 MED ORDER — AMOXICILLIN 500 MG PO CAPS
500.0000 mg | ORAL_CAPSULE | Freq: Three times a day (TID) | ORAL | 0 refills | Status: DC
  Filled 2023-01-08: qty 21, 7d supply, fill #0

## 2023-01-15 NOTE — Progress Notes (Unsigned)
Mammogram scheduled for 2/21

## 2023-01-16 ENCOUNTER — Ambulatory Visit (INDEPENDENT_AMBULATORY_CARE_PROVIDER_SITE_OTHER): Payer: Self-pay | Admitting: Obstetrics and Gynecology

## 2023-01-16 ENCOUNTER — Encounter: Payer: Self-pay | Admitting: Obstetrics and Gynecology

## 2023-01-16 ENCOUNTER — Other Ambulatory Visit (HOSPITAL_COMMUNITY)
Admission: RE | Admit: 2023-01-16 | Discharge: 2023-01-16 | Disposition: A | Discharge status: Home / Self Care | Payer: Medicaid Other | Source: Ambulatory Visit | Attending: Obstetrics and Gynecology | Admitting: Obstetrics and Gynecology

## 2023-01-16 VITALS — BP 118/73 | HR 90 | Ht 62.0 in | Wt 185.0 lb

## 2023-01-16 DIAGNOSIS — Z01419 Encounter for gynecological examination (general) (routine) without abnormal findings: Secondary | ICD-10-CM | POA: Insufficient documentation

## 2023-01-16 NOTE — Progress Notes (Signed)
Subjective:     Kelsey Vaughan is a 47 y.o. female P3 with LMP 12/19/22 and BMI 33 who is here for a comprehensive physical exam. The patient reports no problems. Patient reports a monthly 4-5 day period controlled with birth control pills. She is sexually active using BTL for contraception. She denies pelvic pain or abnormal discharge. She reports regular bowel movement and denies urinary incontinence  Past Medical History:  Diagnosis Date   Allergy    Anxiety    Arthritis    Atrial fibrillation ([place])    a. occuring in 02/2016   B12 deficiency    Complication of anesthesia    Constipation    Foreign body in foot, left 07/2013   GERD (gastroesophageal reflux disease)    Hematuria    Irregular menses    due to oral contraceptive   Palpitations    a. 01/2017: Event monitor showing SR with PVC's.    PONV (postoperative nausea and vomiting)    Sinus tachycardia    Thyroid nodule    Vitamin D deficiency    Past Surgical History:  Procedure Laterality Date   CESAREAN SECTION     FOREIGN BODY REMOVAL Left 08/05/2013   Procedure: LEFT FOOT FOREIGN BODY REMOVAL ADULT;  Surgeon: Wylene Simmer, MD;  Location: [place];  Service: Orthopedics;  Laterality: Left;   SHOULDER ARTHROSCOPY Bilateral    TUBAL LIGATION     Family History  Problem Relation Age of Onset   Diabetes Mother    Hypertension Mother    Kidney disease Mother    CAD Father    Heart attack Father        died from MI at the age of 47   CAD Sister        stents placed at age 20   Breast cancer Neg Hx    Colon cancer Neg Hx    Colon polyps Neg Hx    Esophageal cancer Neg Hx    Stomach cancer Neg Hx    Rectal cancer Neg Hx      Social History   Socioeconomic History   Marital status: Divorced    Spouse name: Not on file   Number of children: 3   Years of education: Not on file   Highest education level: 11th grade  Occupational History   Occupation: Research scientist (life sciences):  [employer]  Tobacco Use   Smoking status: Every Day    Packs/day: 0.25    Years: 6.00    Total pack years: 1.50    Types: Cigarettes   Smokeless tobacco: Never   Tobacco comments:    4-5 cigs/day  Vaping Use   Vaping Use: Never used  Substance and Sexual Activity   Alcohol use: Yes    Comment: occasionally   Drug use: No   Sexual activity: Not Currently    Birth control/protection: Surgical, Pill    Comment: tubal ligation  Other Topics Concern   Not on file  Social History Narrative   Not on file   Social Determinants of Health   Financial Resource Strain: Not on file  Food Insecurity: Not on file  Transportation Needs: Not on file  Physical Activity: Not on file  Stress: Not on file  Social Connections: Not on file  Intimate Partner Violence: Not on file   Health Maintenance  Topic Date Due   COVID-19 Vaccine (1) Never done   DTaP/Tdap/Td (1 - Tdap) Never done   MAMMOGRAM  01/30/2023   PAP SMEAR-Modifier  01/15/2025   COLONOSCOPY (Pts 45-68yr Insurance coverage will need to be confirmed)  10/24/2031   Hepatitis C Screening  Completed   HIV Screening  Completed   HPV VACCINES  Aged Out   INFLUENZA VACCINE  Discontinued       Review of Systems Pertinent items noted in HPI and remainder of comprehensive ROS otherwise negative.   Objective:  Blood pressure 118/73, pulse 90, height '5\' 2"'$  (1.575 m), weight 185 lb (83.9 kg), last menstrual period 12/19/2022.   GENERAL: Well-developed, well-nourished female in no acute distress.  HEENT: Normocephalic, atraumatic. Sclerae anicteric.  NECK: Supple. Normal thyroid.  LUNGS: Clear to auscultation bilaterally.  HEART: Regular rate and rhythm. BREASTS: Symmetric in size. No palpable masses or lymphadenopathy, skin changes, or nipple drainage. ABDOMEN: Soft, nontender, nondistended. No organomegaly. PELVIC: Normal external female genitalia. Vagina is pink and rugated.  Normal discharge. Normal appearing cervix.  Uterus is normal in size. No adnexal mass or tenderness. Chaperone present during the pelvic exam EXTREMITIES: No cyanosis, clubbing, or edema, 2+ distal pulses.     Assessment:    Healthy female exam.      Plan:    Pap smear collected Patient desires STI screening Health maintenance labs ordered Patient current on colon cancer screening Patient scheduled for mammogram this month Patient will be contacted with abnormal results See After Visit Summary for Counseling Recommendations

## 2023-01-17 LAB — CBC
Hematocrit: 40.1 % (ref 34.0–46.6)
Hemoglobin: 13.1 g/dL (ref 11.1–15.9)
MCH: 28.2 pg (ref 26.6–33.0)
MCHC: 32.7 g/dL (ref 31.5–35.7)
MCV: 86 fL (ref 79–97)
Platelets: 306 10*3/uL (ref 150–450)
RBC: 4.64 x10E6/uL (ref 3.77–5.28)
RDW: 14.2 % (ref 11.7–15.4)
WBC: 10.9 10*3/uL — ABNORMAL HIGH (ref 3.4–10.8)

## 2023-01-17 LAB — COMPREHENSIVE METABOLIC PANEL
ALT: 12 IU/L (ref 0–32)
AST: 13 IU/L (ref 0–40)
Albumin/Globulin Ratio: 1.3 (ref 1.2–2.2)
Albumin: 4 g/dL (ref 3.9–4.9)
Alkaline Phosphatase: 81 IU/L (ref 44–121)
BUN/Creatinine Ratio: 13 (ref 9–23)
BUN: 12 mg/dL (ref 6–24)
Bilirubin Total: <0.2 mg/dL (ref 0.0–1.2)
CO2: 22 mmol/L (ref 20–29)
Calcium: 9.1 mg/dL (ref 8.7–10.2)
Chloride: 105 mmol/L (ref 96–106)
Creatinine, Ser: 0.93 mg/dL (ref 0.57–1.00)
Globulin, Total: 3.2 g/dL (ref 1.5–4.5)
Glucose: 83 mg/dL (ref 70–99)
Potassium: 4.4 mmol/L (ref 3.5–5.2)
Sodium: 140 mmol/L (ref 134–144)
Total Protein: 7.2 g/dL (ref 6.0–8.5)
eGFR: 77 mL/min/{1.73_m2} (ref >59)

## 2023-01-17 LAB — HEMOGLOBIN A1C
Est. average glucose Bld gHb Est-mCnc: 120 mg/dL
Hgb A1c MFr Bld: 5.8 % — ABNORMAL HIGH (ref 4.8–5.6)

## 2023-01-17 LAB — LIPID PANEL
Chol/HDL Ratio: 3.6 ratio (ref 0.0–4.4)
Cholesterol, Total: 168 mg/dL (ref 100–199)
HDL: 47 mg/dL (ref >39)
LDL Chol Calc (NIH): 81 mg/dL (ref 0–99)
Triglycerides: 245 mg/dL — ABNORMAL HIGH (ref 0–149)
VLDL Cholesterol Cal: 40 mg/dL (ref 5–40)

## 2023-01-17 LAB — HEPATITIS B SURFACE ANTIGEN: Hepatitis B Surface Ag: NEGATIVE

## 2023-01-17 LAB — HEPATITIS C ANTIBODY: Hep C Virus Ab: NONREACTIVE

## 2023-01-17 LAB — RPR: RPR Ser Ql: NONREACTIVE

## 2023-01-17 LAB — HIV ANTIBODY (ROUTINE TESTING W REFLEX): HIV Screen 4th Generation wRfx: NONREACTIVE

## 2023-01-17 LAB — TSH: TSH: 1.33 u[IU]/mL (ref 0.450–4.500)

## 2023-01-21 LAB — CYTOLOGY - PAP
Adequacy: ABSENT
Chlamydia: NEGATIVE
Comment: NEGATIVE
Comment: NEGATIVE
Comment: NEGATIVE
Comment: NORMAL
Diagnosis: NEGATIVE
High risk HPV: NEGATIVE
Neisseria Gonorrhea: NEGATIVE
Trichomonas: NEGATIVE

## 2023-01-27 ENCOUNTER — Other Ambulatory Visit (HOSPITAL_BASED_OUTPATIENT_CLINIC_OR_DEPARTMENT_OTHER): Payer: Self-pay

## 2023-01-27 ENCOUNTER — Encounter: Payer: Self-pay | Admitting: Physician Assistant

## 2023-01-27 ENCOUNTER — Ambulatory Visit (INDEPENDENT_AMBULATORY_CARE_PROVIDER_SITE_OTHER): Payer: Self-pay | Admitting: Physician Assistant

## 2023-01-27 VITALS — BP 128/87 | HR 98 | Ht 62.0 in | Wt 184.0 lb

## 2023-01-27 DIAGNOSIS — G8929 Other chronic pain: Secondary | ICD-10-CM

## 2023-01-27 DIAGNOSIS — Z23 Encounter for immunization: Secondary | ICD-10-CM

## 2023-01-27 DIAGNOSIS — F410 Panic disorder [episodic paroxysmal anxiety] without agoraphobia: Secondary | ICD-10-CM

## 2023-01-27 DIAGNOSIS — F411 Generalized anxiety disorder: Secondary | ICD-10-CM

## 2023-01-27 DIAGNOSIS — I479 Paroxysmal tachycardia, unspecified: Secondary | ICD-10-CM

## 2023-01-27 DIAGNOSIS — Z6833 Body mass index (BMI) 33.0-33.9, adult: Secondary | ICD-10-CM

## 2023-01-27 DIAGNOSIS — E6609 Other obesity due to excess calories: Secondary | ICD-10-CM

## 2023-01-27 DIAGNOSIS — R7303 Prediabetes: Secondary | ICD-10-CM | POA: Insufficient documentation

## 2023-01-27 DIAGNOSIS — I491 Atrial premature depolarization: Secondary | ICD-10-CM

## 2023-01-27 DIAGNOSIS — M5442 Lumbago with sciatica, left side: Secondary | ICD-10-CM

## 2023-01-27 DIAGNOSIS — E781 Pure hyperglyceridemia: Secondary | ICD-10-CM

## 2023-01-27 DIAGNOSIS — R002 Palpitations: Secondary | ICD-10-CM

## 2023-01-27 DIAGNOSIS — M47816 Spondylosis without myelopathy or radiculopathy, lumbar region: Secondary | ICD-10-CM

## 2023-01-27 MED ORDER — NEBIVOLOL HCL 5 MG PO TABS
5.0000 mg | ORAL_TABLET | Freq: Two times a day (BID) | ORAL | 3 refills | Status: DC
  Filled 2023-01-27: qty 90, 90d supply, fill #0
  Filled 2023-01-27: qty 60, 30d supply, fill #0
  Filled 2023-04-02: qty 180, 90d supply, fill #0

## 2023-01-27 MED ORDER — OZEMPIC (0.25 OR 0.5 MG/DOSE) 2 MG/3ML ~~LOC~~ SOPN
0.5000 mg | PEN_INJECTOR | SUBCUTANEOUS | 1 refills | Status: DC
  Filled 2023-01-27: qty 3, 28d supply, fill #0

## 2023-01-27 MED ORDER — DULOXETINE HCL 30 MG PO CPEP
30.0000 mg | ORAL_CAPSULE | Freq: Every day | ORAL | 1 refills | Status: DC
  Filled 2023-01-27: qty 90, 90d supply, fill #0
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
## 2023-01-28 ENCOUNTER — Other Ambulatory Visit (HOSPITAL_BASED_OUTPATIENT_CLINIC_OR_DEPARTMENT_OTHER): Payer: Self-pay

## 2023-01-28 DIAGNOSIS — E6609 Other obesity due to excess calories: Secondary | ICD-10-CM | POA: Insufficient documentation

## 2023-01-28 DIAGNOSIS — F410 Panic disorder [episodic paroxysmal anxiety] without agoraphobia: Secondary | ICD-10-CM | POA: Insufficient documentation

## 2023-01-30 ENCOUNTER — Other Ambulatory Visit (HOSPITAL_BASED_OUTPATIENT_CLINIC_OR_DEPARTMENT_OTHER): Payer: Self-pay

## 2023-02-03 ENCOUNTER — Other Ambulatory Visit (HOSPITAL_BASED_OUTPATIENT_CLINIC_OR_DEPARTMENT_OTHER): Payer: Self-pay

## 2023-02-05 ENCOUNTER — Ambulatory Visit (INDEPENDENT_AMBULATORY_CARE_PROVIDER_SITE_OTHER): Payer: 59

## 2023-02-05 DIAGNOSIS — Z1231 Encounter for screening mammogram for malignant neoplasm of breast: Secondary | ICD-10-CM

## 2023-02-07 ENCOUNTER — Other Ambulatory Visit (HOSPITAL_BASED_OUTPATIENT_CLINIC_OR_DEPARTMENT_OTHER): Payer: Self-pay

## 2023-02-07 NOTE — Progress Notes (Signed)
Mammogram normal. Follow up in 1 year.

## 2023-02-10 ENCOUNTER — Telehealth: Payer: Self-pay

## 2023-02-10 NOTE — Telephone Encounter (Signed)
Initiated Prior authorization [id number] (0.25 or 0.5 MG/DOSE) '2MG'$ /3ML pen-injectors Via: Covermymeds Case/Key: Status: Approved as of 02/10/23 Reason: Notified Pt via: Mychart

## 2023-02-14 ENCOUNTER — Other Ambulatory Visit (HOSPITAL_BASED_OUTPATIENT_CLINIC_OR_DEPARTMENT_OTHER): Payer: Self-pay

## 2023-02-25 ENCOUNTER — Other Ambulatory Visit: Payer: Self-pay | Admitting: Orthopedic Surgery

## 2023-02-25 ENCOUNTER — Telehealth: Payer: Self-pay

## 2023-02-25 DIAGNOSIS — M5459 Other low back pain: Secondary | ICD-10-CM

## 2023-02-25 NOTE — Telephone Encounter (Signed)
Phone call to patient to discuss order for intradiscal injection. Reviewed pts allergies and most recent weight. Pt was advised not to take oral antibiotics for this procedure that we would give her IV antibiotics prior. Discharge instructions also reviewed with patient and instructed patient to have a driver the day of the procedure. Pt verbalized understanding.   

## 2023-03-12 ENCOUNTER — Ambulatory Visit
Admission: RE | Admit: 2023-03-12 | Discharge: 2023-03-12 | Disposition: A | Discharge status: Home / Self Care | Payer: Worker's Compensation | Source: Ambulatory Visit | Attending: Orthopedic Surgery | Admitting: Orthopedic Surgery

## 2023-03-12 DIAGNOSIS — M5459 Other low back pain: Secondary | ICD-10-CM

## 2023-03-12 MED ORDER — CEFAZOLIN SODIUM-DEXTROSE 2-4 GM/100ML-% IV SOLN
2.0000 g | INTRAVENOUS | Status: AC
  Administered 2023-03-12: 2 g via INTRAVENOUS

## 2023-03-12 MED ORDER — IOPAMIDOL (ISOVUE-M 200) INJECTION 41%
1.0000 mL | Freq: Once | INTRAMUSCULAR | Status: AC
  Administered 2023-03-12: 1 mL

## 2023-03-12 NOTE — Discharge Instructions (Signed)
Intra-Discal Anesthetic Injection Discharge Instruction Sheet  You may resume a regular diet and any medications that you routinely take (including pain medications).  No driving day of procedure.  Light activity throughout the rest of the day.  Do not do any strenuous work, exercise, bending or lifting.  The day following the procedure, you can resume normal physical activity but you should refrain from exercising or physical therapy for at least three days thereafter.   Please contact our office at [phone] for the following symptoms: Fever greater than 100 degrees. Headaches unresolved with medication after 2-3 days.  

## 2023-03-14 ENCOUNTER — Other Ambulatory Visit: Payer: Self-pay | Admitting: Physician Assistant

## 2023-03-14 ENCOUNTER — Other Ambulatory Visit (HOSPITAL_BASED_OUTPATIENT_CLINIC_OR_DEPARTMENT_OTHER): Payer: Self-pay

## 2023-03-14 DIAGNOSIS — Z3009 Encounter for other general counseling and advice on contraception: Secondary | ICD-10-CM

## 2023-03-17 ENCOUNTER — Other Ambulatory Visit (HOSPITAL_BASED_OUTPATIENT_CLINIC_OR_DEPARTMENT_OTHER): Payer: Self-pay

## 2023-03-17 MED ORDER — DESOGESTREL-ETHINYL ESTRADIOL 0.15-30 MG-MCG PO TABS
1.0000 | ORAL_TABLET | Freq: Every day | ORAL | 1 refills | Status: DC
  Filled 2023-03-17 – 2023-05-06 (×2): qty 84, 84d supply, fill #0
  Filled 2023-10-08: qty 84, 84d supply, fill #1

## 2023-03-18 ENCOUNTER — Other Ambulatory Visit (HOSPITAL_BASED_OUTPATIENT_CLINIC_OR_DEPARTMENT_OTHER): Payer: Self-pay

## 2023-03-26 ENCOUNTER — Ambulatory Visit (INDEPENDENT_AMBULATORY_CARE_PROVIDER_SITE_OTHER): Payer: 59 | Admitting: Physician Assistant

## 2023-03-26 ENCOUNTER — Encounter: Payer: Self-pay | Admitting: Physician Assistant

## 2023-03-26 VITALS — BP 131/69 | HR 81 | Ht 62.0 in | Wt 182.0 lb

## 2023-03-26 DIAGNOSIS — E6609 Other obesity due to excess calories: Secondary | ICD-10-CM

## 2023-03-26 DIAGNOSIS — I491 Atrial premature depolarization: Secondary | ICD-10-CM | POA: Diagnosis not present

## 2023-03-26 DIAGNOSIS — E781 Pure hyperglyceridemia: Secondary | ICD-10-CM | POA: Diagnosis not present

## 2023-03-26 DIAGNOSIS — R7303 Prediabetes: Secondary | ICD-10-CM

## 2023-03-26 DIAGNOSIS — Z6833 Body mass index (BMI) 33.0-33.9, adult: Secondary | ICD-10-CM

## 2023-03-26 MED ORDER — OZEMPIC (0.25 OR 0.5 MG/DOSE) 2 MG/3ML ~~LOC~~ SOPN: 0.5000 mg | PEN_INJECTOR | SUBCUTANEOUS | 1 refills | Status: DC

## 2023-03-26 NOTE — Progress Notes (Signed)
   Established Patient Office Visit  Subjective   Patient ID: Kelsey Vaughan, female    DOB: 1976/01/04  Age: 47 y.o. MRN: [id number]  Chief Complaint  Patient presents with   Follow-up   Pt is a 47 yo obese female with HTN, PAC, Pre-diabetes, MDD who presents to the clinic to follow up on ozempic. She is doing well with no side effects. She has been taking ozempic for the last month. She has noticed her cravings are decreased and eating less. Lost 2lbs.    Review of Systems  All other systems reviewed and are negative.     Objective:     BP 131/69   Pulse 81   Ht 5\' 2"  (1.575 m)   Wt 182 lb (82.6 kg)   SpO2 99%   BMI 33.29 kg/m  BP Readings from Last 3 Encounters:  03/26/23 131/69  03/12/23 121/82  01/27/23 128/87   Wt Readings from Last 3 Encounters:  03/26/23 182 lb (82.6 kg)  02/25/23 181 lb (82.1 kg)  01/27/23 184 lb (83.5 kg)      Physical Exam Constitutional:      Appearance: Normal appearance. She is obese.  HENT:     Head: Normocephalic.  Cardiovascular:     Rate and Rhythm: Normal rate and regular rhythm.     Pulses: Normal pulses.     Heart sounds: Normal heart sounds.  Pulmonary:     Effort: Pulmonary effort is normal.     Breath sounds: Normal breath sounds.  Musculoskeletal:     Right lower leg: No edema.     Left lower leg: No edema.  Neurological:     General: No focal deficit present.     Mental Status: She is alert and oriented to person, place, and time.  Psychiatric:        Mood and Affect: Mood normal.      The 10-year ASCVD risk score (Arnett DK, et al., 2019) is: 3%    Assessment & Plan:  [name] KitchenMarland KitchenChrislynn was seen today for follow-up.  Diagnoses and all orders for this visit:  Pre-diabetes -     Semaglutide,0.25 or 0.5MG /DOS, (OZEMPIC, 0.25 OR 0.5 MG/DOSE,) 2 MG/3ML SOPN; Inject 0.5 mg into the skin once a week.  Hypertriglyceridemia  PAC (premature atrial contraction)  Class 1 obesity due to excess calories without serious  comorbidity with body mass index (BMI) of 33.0 to 33.9 in adult -     Semaglutide,0.25 or 0.5MG /DOS, (OZEMPIC, 0.25 OR 0.5 MG/DOSE,) 2 MG/3ML SOPN; Inject 0.5 mg into the skin once a week.   Increased ozempic to .5mg  weekly Encouraged healthy diet Exercise 150 minutes a week Follow up in 6 months   Tandy Gaw, PA-C

## 2023-03-31 ENCOUNTER — Encounter: Payer: Self-pay | Admitting: *Deleted

## 2023-04-02 ENCOUNTER — Other Ambulatory Visit (HOSPITAL_BASED_OUTPATIENT_CLINIC_OR_DEPARTMENT_OTHER): Payer: Self-pay

## 2023-04-02 ENCOUNTER — Other Ambulatory Visit: Payer: Self-pay | Admitting: Physician Assistant

## 2023-04-02 ENCOUNTER — Other Ambulatory Visit: Payer: Self-pay

## 2023-04-02 DIAGNOSIS — G4452 New daily persistent headache (NDPH): Secondary | ICD-10-CM

## 2023-04-02 MED ORDER — SUMATRIPTAN SUCCINATE 100 MG PO TABS
ORAL_TABLET | ORAL | 5 refills | Status: DC
  Filled 2023-04-02: qty 10, 30d supply, fill #0
  Filled 2023-12-23: qty 9, 30d supply, fill #1

## 2023-04-03 ENCOUNTER — Other Ambulatory Visit (HOSPITAL_BASED_OUTPATIENT_CLINIC_OR_DEPARTMENT_OTHER): Payer: Self-pay

## 2023-04-03 ENCOUNTER — Other Ambulatory Visit: Payer: Self-pay | Admitting: Physician Assistant

## 2023-04-04 ENCOUNTER — Other Ambulatory Visit: Payer: Self-pay | Admitting: Physician Assistant

## 2023-04-04 ENCOUNTER — Other Ambulatory Visit (HOSPITAL_BASED_OUTPATIENT_CLINIC_OR_DEPARTMENT_OTHER): Payer: Self-pay

## 2023-04-04 DIAGNOSIS — E6609 Other obesity due to excess calories: Secondary | ICD-10-CM

## 2023-04-04 DIAGNOSIS — R7303 Prediabetes: Secondary | ICD-10-CM

## 2023-04-04 MED ORDER — OZEMPIC (0.25 OR 0.5 MG/DOSE) 2 MG/3ML ~~LOC~~ SOPN
0.5000 mg | PEN_INJECTOR | SUBCUTANEOUS | 1 refills | Status: DC
  Filled 2023-04-04: qty 3, 28d supply, fill #0

## 2023-04-04 MED ORDER — VALACYCLOVIR HCL 1 G PO TABS
1000.0000 mg | ORAL_TABLET | Freq: Two times a day (BID) | ORAL | 11 refills | Status: DC
  Filled 2023-04-04: qty 14, 7d supply, fill #0
  Filled 2023-12-23: qty 14, 7d supply, fill #1

## 2023-04-08 ENCOUNTER — Other Ambulatory Visit (HOSPITAL_BASED_OUTPATIENT_CLINIC_OR_DEPARTMENT_OTHER): Payer: Self-pay

## 2023-04-08 ENCOUNTER — Other Ambulatory Visit: Payer: Self-pay

## 2023-04-10 ENCOUNTER — Other Ambulatory Visit (HOSPITAL_BASED_OUTPATIENT_CLINIC_OR_DEPARTMENT_OTHER): Payer: Self-pay

## 2023-04-11 ENCOUNTER — Other Ambulatory Visit: Payer: Self-pay | Admitting: Physician Assistant

## 2023-04-11 ENCOUNTER — Other Ambulatory Visit (HOSPITAL_BASED_OUTPATIENT_CLINIC_OR_DEPARTMENT_OTHER): Payer: Self-pay

## 2023-04-11 DIAGNOSIS — R5383 Other fatigue: Secondary | ICD-10-CM

## 2023-04-11 DIAGNOSIS — G478 Other sleep disorders: Secondary | ICD-10-CM

## 2023-04-11 NOTE — Progress Notes (Signed)
1 week of severe fatigue. Taking 2-4 hour naps and still tired when she wakes up.

## 2023-04-14 ENCOUNTER — Other Ambulatory Visit (HOSPITAL_BASED_OUTPATIENT_CLINIC_OR_DEPARTMENT_OTHER): Payer: Self-pay

## 2023-04-15 ENCOUNTER — Other Ambulatory Visit (HOSPITAL_BASED_OUTPATIENT_CLINIC_OR_DEPARTMENT_OTHER): Payer: Self-pay

## 2023-04-17 ENCOUNTER — Other Ambulatory Visit (HOSPITAL_BASED_OUTPATIENT_CLINIC_OR_DEPARTMENT_OTHER): Payer: Self-pay

## 2023-04-21 ENCOUNTER — Other Ambulatory Visit (HOSPITAL_BASED_OUTPATIENT_CLINIC_OR_DEPARTMENT_OTHER): Payer: Self-pay

## 2023-04-22 ENCOUNTER — Other Ambulatory Visit: Payer: Self-pay

## 2023-04-22 ENCOUNTER — Other Ambulatory Visit (HOSPITAL_BASED_OUTPATIENT_CLINIC_OR_DEPARTMENT_OTHER): Payer: Self-pay

## 2023-05-06 ENCOUNTER — Other Ambulatory Visit (HOSPITAL_BASED_OUTPATIENT_CLINIC_OR_DEPARTMENT_OTHER): Payer: Self-pay

## 2023-05-14 ENCOUNTER — Ambulatory Visit: Payer: 59 | Admitting: Medical-Surgical

## 2023-05-15 ENCOUNTER — Ambulatory Visit (INDEPENDENT_AMBULATORY_CARE_PROVIDER_SITE_OTHER): Payer: 59 | Admitting: Sports Medicine

## 2023-05-15 ENCOUNTER — Other Ambulatory Visit (HOSPITAL_BASED_OUTPATIENT_CLINIC_OR_DEPARTMENT_OTHER): Payer: Self-pay

## 2023-05-15 DIAGNOSIS — M47816 Spondylosis without myelopathy or radiculopathy, lumbar region: Secondary | ICD-10-CM | POA: Diagnosis not present

## 2023-05-15 MED ORDER — PREGABALIN 50 MG PO CAPS
50.0000 mg | ORAL_CAPSULE | Freq: Three times a day (TID) | ORAL | 3 refills | Status: DC | PRN
  Filled 2023-05-15: qty 60, 27d supply, fill #0

## 2023-05-15 NOTE — Progress Notes (Signed)
    Procedures performed today:    None.  Independent interpretation of notes and tests performed by another provider:   None.  Brief History, Exam, Impression, and Recommendations:    Lumbar facet arthropathy Marla returns, she is a 47 year old female, chronic back pain, currently under [employer]. claim. Pain is axial with radiation down the left leg, she has had multiple interventions including medications such as gabapentin, physical therapy, she has had multiple injections including epidurals, facet blocks and a discogram, she initially had some relief with the facet injection, but pain returned and another injection did not provide similar relief, she has not had relief with epidurals, she did get concordant pain during the discogram at L5-S1 with temporary relief and then return of pain. She had a nerve conduction and EMG that did show lumbosacral radiculopathy. She has been working with Dr. Yevette Edwards with spine surgery, no surgical pathology was noted on her imaging.  Dr. Yevette Edwards did suggest an FCE to get her a final rating as she has likely reached MMI, but her Worker's Comp. did not pay for the FCE. I looked over her imaging studies, she does not have any operable pathology, I think ultimately she is going to need medical pain management. We will go ahead and give her some Lyrica in a slow up taper, tramadol remains an option. I would agree with Dr. Yevette Edwards that I think we have reached maximum medical improvement and I am going to go ahead and order her FCE, she can take the results to Dr. Yevette Edwards and her Worker's Comp. Banker.    ____________________________________________ Ihor Austin. Benjamin Stain, M.D., ABFM., CAQSM., AME. Primary Care and Sports Medicine [facility]  Adjunct Professor of Family Medicine  [employer]  Restaurant manager, fast food

## 2023-05-15 NOTE — Assessment & Plan Note (Signed)
Kelsey Vaughan returns, she is a 47 year old female, chronic back pain, currently under [employer]. claim. Pain is axial with radiation down the left leg, she has had multiple interventions including medications such as gabapentin, physical therapy, she has had multiple injections including epidurals, facet blocks and a discogram, she initially had some relief with the facet injection, but pain returned and another injection did not provide similar relief, she has not had relief with epidurals, she did get concordant pain during the discogram at L5-S1 with temporary relief and then return of pain. She had a nerve conduction and EMG that did show lumbosacral radiculopathy. She has been working with Dr. Yevette Edwards with spine surgery, no surgical pathology was noted on her imaging.  Dr. Yevette Edwards did suggest an FCE to get her a final rating as she has likely reached MMI, but her Worker's Comp. did not pay for the FCE. I looked over her imaging studies, she does not have any operable pathology, I think ultimately she is going to need medical pain management. We will go ahead and give her some Lyrica in a slow up taper, tramadol remains an option. I would agree with Dr. Yevette Edwards that I think we have reached maximum medical improvement and I am going to go ahead and order her FCE, she can take the results to Dr. Yevette Edwards and her Worker's Comp. Banker.

## 2023-05-16 ENCOUNTER — Encounter: Payer: Self-pay | Admitting: Sports Medicine

## 2023-05-16 DIAGNOSIS — M47816 Spondylosis without myelopathy or radiculopathy, lumbar region: Secondary | ICD-10-CM

## 2023-05-21 ENCOUNTER — Encounter: Payer: Self-pay | Admitting: Physician Assistant

## 2023-05-31 ENCOUNTER — Ambulatory Visit: Payer: 59

## 2023-06-03 ENCOUNTER — Other Ambulatory Visit (HOSPITAL_BASED_OUTPATIENT_CLINIC_OR_DEPARTMENT_OTHER): Payer: Self-pay

## 2023-06-03 ENCOUNTER — Ambulatory Visit (INDEPENDENT_AMBULATORY_CARE_PROVIDER_SITE_OTHER): Payer: 59 | Admitting: Physician Assistant

## 2023-06-03 ENCOUNTER — Encounter: Payer: Self-pay | Admitting: Physician Assistant

## 2023-06-03 VITALS — BP 140/78 | HR 110 | Ht 62.0 in | Wt 184.0 lb

## 2023-06-03 DIAGNOSIS — H6502 Acute serous otitis media, left ear: Secondary | ICD-10-CM | POA: Diagnosis not present

## 2023-06-03 MED ORDER — AMOXICILLIN-POT CLAVULANATE 875-125 MG PO TABS
1.0000 | ORAL_TABLET | Freq: Two times a day (BID) | ORAL | 0 refills | Status: DC
  Filled 2023-06-03: qty 20, 10d supply, fill #0

## 2023-06-03 MED ORDER — METHYLPREDNISOLONE 4 MG PO TBPK
ORAL_TABLET | ORAL | 0 refills | Status: DC
  Filled 2023-06-03: qty 21, 6d supply, fill #0
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
## 2023-06-04 ENCOUNTER — Encounter: Payer: Self-pay | Admitting: Physician Assistant

## 2023-07-17 ENCOUNTER — Telehealth: Payer: Self-pay | Admitting: Physician Assistant

## 2023-07-17 NOTE — Telephone Encounter (Signed)
[facility] called needed a medical clearance form. They have faxed it in. Please advise.

## 2023-07-18 ENCOUNTER — Telehealth: Payer: Self-pay | Admitting: Physician Assistant

## 2023-07-18 NOTE — Telephone Encounter (Signed)
Done

## 2023-07-18 NOTE — Telephone Encounter (Signed)
[facility] called back again today to check on fax for Medical clearance. Please Advise.

## 2023-07-31 NOTE — Telephone Encounter (Signed)
Called [facility] at [phone] spoke with "Amber"  Was told that patient completed her functional capacity exam  on 07/22/2023 and if this was  What was in question it has been completed and nothing  further is needed.

## 2023-08-13 ENCOUNTER — Encounter: Payer: Self-pay | Admitting: Physician Assistant

## 2023-08-13 ENCOUNTER — Ambulatory Visit (INDEPENDENT_AMBULATORY_CARE_PROVIDER_SITE_OTHER): Payer: 59

## 2023-08-13 ENCOUNTER — Telehealth (INDEPENDENT_AMBULATORY_CARE_PROVIDER_SITE_OTHER): Payer: 59 | Admitting: Physician Assistant

## 2023-08-13 VITALS — HR 99 | Ht 62.0 in

## 2023-08-13 DIAGNOSIS — R0602 Shortness of breath: Secondary | ICD-10-CM | POA: Diagnosis not present

## 2023-08-13 DIAGNOSIS — R55 Syncope and collapse: Secondary | ICD-10-CM | POA: Diagnosis not present

## 2023-08-13 DIAGNOSIS — M79661 Pain in right lower leg: Secondary | ICD-10-CM

## 2023-08-13 NOTE — Progress Notes (Signed)
No DVT. GREAT news.

## 2023-08-13 NOTE — Patient Instructions (Signed)
Increase bystolic 5mg  twice a day.
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
## 2023-08-15 ENCOUNTER — Encounter: Payer: Self-pay | Admitting: Physician Assistant

## 2023-08-15 DIAGNOSIS — R0602 Shortness of breath: Secondary | ICD-10-CM | POA: Insufficient documentation

## 2023-09-24 ENCOUNTER — Ambulatory Visit (HOSPITAL_COMMUNITY)
Admission: RE | Admit: 2023-09-24 | Discharge: 2023-09-24 | Disposition: A | Discharge status: Home / Self Care | Payer: 59 | Source: Ambulatory Visit | Attending: Physician Assistant | Admitting: Physician Assistant

## 2023-09-24 DIAGNOSIS — R0600 Dyspnea, unspecified: Secondary | ICD-10-CM | POA: Diagnosis not present

## 2023-09-24 DIAGNOSIS — R0602 Shortness of breath: Secondary | ICD-10-CM | POA: Diagnosis not present

## 2023-09-24 DIAGNOSIS — R55 Syncope and collapse: Secondary | ICD-10-CM

## 2023-09-24 DIAGNOSIS — I4891 Unspecified atrial fibrillation: Secondary | ICD-10-CM | POA: Insufficient documentation

## 2023-09-24 LAB — ECHOCARDIOGRAM COMPLETE
AR max vel: 2.17 cm2
AV Area VTI: 2.21 cm2
AV Area mean vel: 2.28 cm2
AV Mean grad: 4 mmHg
AV Peak grad: 5.7 mmHg
Ao pk vel: 1.19 m/s
Area-P 1/2: 3.76 cm2
S' Lateral: 3 cm

## 2023-09-25 NOTE — Progress Notes (Signed)
Echo looks really good. Ejection Fracture looks good. No abnormal valves. Some very mild diastolic dysfunction but nothing to be concerned with! GREAT news.

## 2023-09-29 ENCOUNTER — Other Ambulatory Visit (HOSPITAL_BASED_OUTPATIENT_CLINIC_OR_DEPARTMENT_OTHER): Payer: Self-pay

## 2023-09-29 ENCOUNTER — Encounter: Payer: Self-pay | Admitting: Physician Assistant

## 2023-09-29 ENCOUNTER — Encounter (HOSPITAL_BASED_OUTPATIENT_CLINIC_OR_DEPARTMENT_OTHER): Payer: Self-pay

## 2023-09-29 ENCOUNTER — Ambulatory Visit (INDEPENDENT_AMBULATORY_CARE_PROVIDER_SITE_OTHER): Payer: 59 | Admitting: Physician Assistant

## 2023-09-29 VITALS — BP 104/70 | HR 83 | Ht 62.0 in | Wt 188.0 lb

## 2023-09-29 DIAGNOSIS — R7303 Prediabetes: Secondary | ICD-10-CM

## 2023-09-29 DIAGNOSIS — F410 Panic disorder [episodic paroxysmal anxiety] without agoraphobia: Secondary | ICD-10-CM

## 2023-09-29 DIAGNOSIS — Z0000 Encounter for general adult medical examination without abnormal findings: Secondary | ICD-10-CM | POA: Diagnosis not present

## 2023-09-29 DIAGNOSIS — E781 Pure hyperglyceridemia: Secondary | ICD-10-CM

## 2023-09-29 DIAGNOSIS — G8929 Other chronic pain: Secondary | ICD-10-CM

## 2023-09-29 DIAGNOSIS — M5442 Lumbago with sciatica, left side: Secondary | ICD-10-CM

## 2023-09-29 DIAGNOSIS — E66812 Obesity, class 2: Secondary | ICD-10-CM

## 2023-09-29 DIAGNOSIS — F411 Generalized anxiety disorder: Secondary | ICD-10-CM

## 2023-09-29 DIAGNOSIS — Z716 Tobacco abuse counseling: Secondary | ICD-10-CM

## 2023-09-29 DIAGNOSIS — M47816 Spondylosis without myelopathy or radiculopathy, lumbar region: Secondary | ICD-10-CM

## 2023-09-29 DIAGNOSIS — F5101 Primary insomnia: Secondary | ICD-10-CM

## 2023-09-29 DIAGNOSIS — E049 Nontoxic goiter, unspecified: Secondary | ICD-10-CM

## 2023-09-29 DIAGNOSIS — I491 Atrial premature depolarization: Secondary | ICD-10-CM

## 2023-09-29 MED ORDER — DULOXETINE HCL 30 MG PO CPEP
30.0000 mg | ORAL_CAPSULE | Freq: Every day | ORAL | 3 refills | Status: DC
  Filled 2023-09-29: qty 90, 90d supply, fill #0

## 2023-09-29 MED ORDER — NICOTINE 21 MG/24HR TD PT24
21.0000 mg | MEDICATED_PATCH | Freq: Every day | TRANSDERMAL | 1 refills | Status: DC
  Filled 2023-09-29: qty 28, 28d supply, fill #0
  Filled 2023-12-23: qty 28, 28d supply, fill #1

## 2023-09-29 MED ORDER — BELSOMRA 10 MG PO TABS
1.0000 | ORAL_TABLET | Freq: Every day | ORAL | 1 refills | Status: DC
  Filled 2023-09-29: qty 30, 30d supply, fill #0

## 2023-09-29 NOTE — Progress Notes (Signed)
Complete physical exam  Patient: Kelsey Vaughan   DOB: May 19, 1976   47 y.o. Female  MRN: [id number]  Subjective:    No chief complaint on file.   Kelsey Vaughan is a 47 y.o. female who presents today for a complete physical exam. She reports consuming a general diet. The patient does not participate in regular exercise at present. She generally feels fairly well. She reports sleeping poorly. She does  have additional problems to discuss today.   She continues to have problems with her sleep. She has problems going and staying asleep. She never tried the trazodone given at another appt.   She continues to have low back pain but fairly stable right now.    Most recent fall risk assessment:    06/03/2023    3:13 PM  Fall Risk   Falls in the past year? 0  Number falls in past yr: 0  Injury with Fall? 0  Risk for fall due to : No Fall Risks  Follow up Falls evaluation completed     Most recent depression screenings:    09/29/2023   10:05 AM 06/03/2023    3:13 PM  PHQ 2/9 Scores  PHQ - 2 Score 6 1  PHQ- 9 Score 18     Vision:Within last year and Dental: No current dental problems and Receives regular dental care  Patient Active Problem List   Diagnosis Date Noted   Multiple thyroid nodules 10/06/2023   Thyroid enlarged 10/06/2023   Primary insomnia 09/29/2023   SOB (shortness of breath) 08/15/2023   Non-recurrent acute serous otitis media of left ear 06/03/2023   Panic attacks 01/28/2023   Class 1 obesity due to excess calories without serious comorbidity with body mass index (BMI) of 33.0 to 33.9 in adult 01/28/2023   Pre-diabetes 01/27/2023   Panic disorder 07/22/2022   Blurry vision, bilateral 07/22/2022   Vision changes 01/07/2022   Near syncope 01/07/2022   Medication management 10/19/2021   Frontal lobe and executive function deficit 08/06/2021   Difficulty coping 08/06/2021   Bipolar 1 disorder, mixed, severe (HCC) 07/09/2021   Mood disorder (HCC)  07/09/2021   Severe episode of recurrent major depressive disorder, without psychotic features (HCC) 07/09/2021   Mass of left side of neck 06/12/2021   Abnormal urine color 06/12/2021   Left elbow pain 10/04/2020   Numbness and tingling of right arm 06/28/2020   Metatarsalgia of right foot 01/10/2020   Choking 11/17/2019   Dysphagia 11/17/2019   Patellofemoral pain syndrome of left knee 10/27/2019   Current smoker 10/22/2019   Anxiety 10/22/2019   Supraspinatus tendon tear 09/13/2019   Chronic left-sided low back pain with left-sided sciatica 06/02/2019   Impulsiveness 02/15/2019   Inattention 02/15/2019   Dry eye of right side 01/18/2019   Pterygium of right eye 01/18/2019   Cramping of feet 01/07/2019   Paresthesia 01/07/2019   Lumbar facet arthropathy 12/17/2018   Acute stress disorder 09/03/2018   Trouble in sleeping 09/03/2018   Fatigue 09/03/2018   Chronic glomerulonephritis 08/11/2018   Elevated fasting glucose 05/25/2018   Hypertriglyceridemia 05/25/2018   Binge-eating disorder, mild 05/20/2018   Irritability and anger 02/23/2018   Mood changes 12/24/2017   Irritable 12/24/2017   Vaginal discharge 11/04/2017   Vaginal irritation 11/04/2017   Lower abdominal pain 11/04/2017   Severe anxiety 10/14/2017   Stress reaction 10/12/2017   Frontal headache 09/16/2017   MDD (major depressive disorder), recurrent episode, mild (HCC) 09/03/2017   Insulin resistance 07/30/2017  Epigastric abdominal pain 04/17/2017   Constipation 04/17/2017   Hematuria 04/17/2017   Chronic sinusitis 04/07/2017   Leiomyoma of uterus 02/26/2017   Renal cyst, right 02/26/2017   Sinus tachycardia 02/06/2017   Palpitations 01/29/2017   PAC (premature atrial contraction) 01/29/2017   Vitamin D deficiency 12/23/2016   B12 deficiency 12/23/2016   Dysmenorrhea 12/19/2016   Bilateral cold feet 12/19/2016   New daily persistent headache 12/19/2016   GAD (generalized anxiety disorder) 12/19/2016    Tobacco dependence 12/19/2016   Tachycardia, paroxysmal (HCC) 03/07/2016   Microscopic hematuria 02/29/2016   Allergy to influenza vaccine 10/02/2015   Class 1 obesity due to excess calories without serious comorbidity with body mass index (BMI) of 30.0 to 30.9 in adult 11/25/2014   Neck strain 11/04/2014   Sinus tarsi syndrome of left ankle 07/16/2013   Past Medical History:  Diagnosis Date   Allergy    Anxiety    Arthritis    Atrial fibrillation (HCC)    a. occuring in 02/2016   B12 deficiency    Complication of anesthesia    Constipation    Foreign body in foot, left 07/2013   GERD (gastroesophageal reflux disease)    Hematuria    Irregular menses    due to oral contraceptive   Palpitations    a. 01/2017: Event monitor showing SR with PVC's.    PONV (postoperative nausea and vomiting)    Sinus tachycardia    Thyroid nodule    Vitamin D deficiency    Past Surgical History:  Procedure Laterality Date   CESAREAN SECTION     FOREIGN BODY REMOVAL Left 08/05/2013   Procedure: LEFT FOOT FOREIGN BODY REMOVAL ADULT;  Surgeon: Toni Arthurs, MD;  Location: [facility];  Service: Orthopedics;  Laterality: Left;   SHOULDER ARTHROSCOPY Bilateral    TUBAL LIGATION     Family Status  Relation Name Status   Mother  Alive   Father  Deceased   Sister  Alive   Neg Hx  (Not Specified)  No partnership data on file   Family History  Problem Relation Age of Onset   Diabetes Mother    Hypertension Mother    Kidney disease Mother    CAD Father    Heart attack Father        died from MI at the age of 21   CAD Sister        stents placed at age 67   Breast cancer Neg Hx    Colon cancer Neg Hx    Colon polyps Neg Hx    Esophageal cancer Neg Hx    Stomach cancer Neg Hx    Rectal cancer Neg Hx    Allergies  Allergen Reactions   Influenza Vaccines Hives and Rash   Latex Itching and Rash   Vyvanse [Lisdexamfetamine Dimesylate]     Headache/mood changes.     Wellbutrin [Bupropion] Other (See Comments)    Possible nipple soreness/headaches/not feeling right   Hydrocodone Nausea And Vomiting   Hydrocodone-Acetaminophen Rash and Nausea Only      Patient Care Team: Nolene Ebbs as PCP - General (Family Medicine) Lewayne Bunting, MD as PCP - Cardiology (Cardiology)   Outpatient Medications Prior to Visit  Medication Sig   aspirin EC 81 MG tablet Take 1 tablet (81 mg total) by mouth daily.   desogestrel-ethinyl estradiol (ISIBLOOM) 0.15-30 MG-MCG tablet Take 1 tablet by mouth daily.   levocetirizine (XYZAL) 5 MG tablet Take 1 tablet (  5 mg total) by mouth every evening.   mometasone (NASONEX) 50 MCG/ACT nasal spray One spray in each nostril twice a day, use left hand for right nostril, and right hand for left nostril.  Please dispense one bottle.   nebivolol (BYSTOLIC) 5 MG tablet Take 1 tablet (5 mg total) by mouth in the morning and at bedtime.   SUMAtriptan (IMITREX) 100 MG tablet TAKE 1 TABLET (100 MG TOTAL) BY MOUTH ONCE AS NEEDED FOR UP TO 1 DOSE FOR MIGRAINE. MAY REPEAT IN 2 HOURS IF HEADACHE PERSISTS OR RECURS.   valACYclovir (VALTREX) 1000 MG tablet Take 1 tablet (1,000 mg total) by mouth 2 (two) times daily.   [DISCONTINUED] DULoxetine (CYMBALTA) 30 MG capsule Take 1 capsule (30 mg total) by mouth daily.   [DISCONTINUED] pregabalin (LYRICA) 50 MG capsule Take 1 capsule (50 mg total) by mouth nightly for 1 week, then take 1 capsule (50 mg total) by mouth twice daily for 1 week, and then take 1 capsule (50 mg total) by mouth 3 (three) times daily as needed. (Patient not taking: Reported on 09/29/2023)   No facility-administered medications prior to visit.    ROS  See HPI.       Objective:     BP 104/70   Pulse 83   Ht 5\' 2"  (1.575 m)   Wt 188 lb (85.3 kg)   SpO2 99%   BMI 34.39 kg/m  BP Readings from Last 3 Encounters:  09/29/23 104/70  06/03/23 (!) 140/78  03/26/23 131/69   Wt Readings from Last 3 Encounters:   09/29/23 188 lb (85.3 kg)  06/03/23 184 lb (83.5 kg)  03/26/23 182 lb (82.6 kg)      Physical Exam  BP 104/70   Pulse 83   Ht 5\' 2"  (1.575 m)   Wt 188 lb (85.3 kg)   SpO2 99%   BMI 34.39 kg/m   General Appearance:    Alert, cooperative, obese no distress, appears stated age  Head:    Normocephalic, without obvious abnormality, atraumatic  Eyes:    PERRL, conjunctiva/corneas clear, EOM's intact, fundi    benign, both eyes  Ears:    Normal TM's and external ear canals, both ears  Nose:   Nares normal, septum midline, mucosa normal, no drainage    or sinus tenderness  Throat:   Lips, mucosa, and tongue normal; teeth and gums normal  Neck:   Supple, symmetrical, trachea midline, no adenopathy;    Bilateral thyroid enlargement on palpation. no carotid   bruit or JVD  Back:     Symmetric, no curvature, ROM normal, no CVA tenderness  Lungs:     Clear to auscultation bilaterally, respirations unlabored  Chest Wall:    No tenderness or deformity   Heart:    Regular rate and rhythm, S1 and S2 normal, no murmur, rub   or gallop     Abdomen:     Soft, non-tender, bowel sounds active all four quadrants,    no masses, no organomegaly        Extremities:   Extremities normal, atraumatic, no cyanosis or edema  Pulses:   2+ and symmetric all extremities  Skin:   Skin color, texture, turgor normal, no rashes or lesions  Lymph nodes:   Cervical, supraclavicular, and axillary nodes normal  Neurologic:   CNII-XII intact, normal strength, sensation and reflexes    throughout      Assessment & Plan:    Routine Health Maintenance and Physical Exam  Immunization  History  Administered Date(s) Administered   Tdap 01/27/2023    Health Maintenance  Topic Date Due   COVID-19 Vaccine (1) 10/15/2023 (Originally 08/18/1981)   MAMMOGRAM  02/06/2024   Cervical Cancer Screening (HPV/Pap Cotest)  01/17/2028   Colonoscopy  10/24/2031   DTaP/Tdap/Td (2 - Td or Tdap) 01/27/2033   Hepatitis C  Screening  Completed   HIV Screening  Completed   HPV VACCINES  Aged Out   INFLUENZA VACCINE  Discontinued    Discussed health benefits of physical activity, and encouraged her to engage in regular exercise appropriate for her age and condition.  [name] Kitchen.Diagnoses and all orders for this visit:  Routine physical examination -     TSH + free T4 -     Thyroid peroxidase antibody -     CMP14+EGFR -     Lipid panel -     CBC w/Diff/Platelet -     Hemoglobin A1c  GAD (generalized anxiety disorder) -     DULoxetine (CYMBALTA) 30 MG capsule; Take 1 capsule (30 mg total) by mouth daily.  Lumbar facet arthropathy -     DULoxetine (CYMBALTA) 30 MG capsule; Take 1 capsule (30 mg total) by mouth daily.  Panic disorder -     DULoxetine (CYMBALTA) 30 MG capsule; Take 1 capsule (30 mg total) by mouth daily.  Panic attacks -     DULoxetine (CYMBALTA) 30 MG capsule; Take 1 capsule (30 mg total) by mouth daily.  Chronic left-sided low back pain with left-sided sciatica -     DULoxetine (CYMBALTA) 30 MG capsule; Take 1 capsule (30 mg total) by mouth daily.  Pre-diabetes -     Hemoglobin A1c  Hypertriglyceridemia -     TSH + free T4 -     Thyroid peroxidase antibody -     CMP14+EGFR -     Lipid panel -     CBC w/Diff/Platelet -     Hemoglobin A1c  PAC (premature atrial contraction)  Primary insomnia -     Suvorexant (BELSOMRA) 10 MG TABS; Take 1 tablet (10 mg total) by mouth at bedtime.  Class 2 obesity due to excess calories without serious comorbidity with body mass index (BMI) of 37.0 to 37.9 in adult -     TSH + free T4 -     Thyroid peroxidase antibody -     CMP14+EGFR -     Lipid panel -     CBC w/Diff/Platelet -     Hemoglobin A1c  Encounter for smoking cessation counseling -     nicotine (NICODERM CQ) 21 mg/24hr patch; Place 1 patch (21 mg total) onto the skin daily.  Thyroid enlarged -     US THYROID; Future  .[name] Kitchen Discussed 150 minutes of exercise a week.  Encouraged  vitamin D 1000 units and Calcium 1300mg  or 4 servings of dairy a day.  Fasting labs ordered Vitals look good Stay on same BP medications PHQ not to goal, declines and changes to mood medication Colonoscopy UTD Pap and mammogram UTD Declined flu and covid vaccine Thyroid was enlarged on exam today, [place] ordered.   Discussed smoking cessation Patches given today Need to stop smoking if going to be on OCP Pt aware of increase risk of blood clots and stroke with this combination Follow up in 1 month  Discussed sleep and good sleep routine Consider trying trazodone or belsomnra Follow up in 1 month   Tandy Gaw, PA-C

## 2023-09-29 NOTE — Patient Instructions (Signed)

## 2023-09-30 ENCOUNTER — Ambulatory Visit: Payer: 59

## 2023-09-30 ENCOUNTER — Other Ambulatory Visit: Payer: 59

## 2023-09-30 DIAGNOSIS — E049 Nontoxic goiter, unspecified: Secondary | ICD-10-CM

## 2023-09-30 LAB — CMP14+EGFR
ALT: 13 [IU]/L (ref 0–32)
AST: 11 [IU]/L (ref 0–40)
Albumin: 4.1 g/dL (ref 3.9–4.9)
Alkaline Phosphatase: 93 [IU]/L (ref 44–121)
BUN/Creatinine Ratio: 12 (ref 9–23)
BUN: 12 mg/dL (ref 6–24)
Bilirubin Total: 0.2 mg/dL (ref 0.0–1.2)
CO2: 19 mmol/L — ABNORMAL LOW (ref 20–29)
Calcium: 9.4 mg/dL (ref 8.7–10.2)
Chloride: 106 mmol/L (ref 96–106)
Creatinine, Ser: 1.02 mg/dL — ABNORMAL HIGH (ref 0.57–1.00)
Globulin, Total: 3.1 g/dL (ref 1.5–4.5)
Glucose: 93 mg/dL (ref 70–99)
Potassium: 4.4 mmol/L (ref 3.5–5.2)
Sodium: 141 mmol/L (ref 134–144)
Total Protein: 7.2 g/dL (ref 6.0–8.5)
eGFR: 68 mL/min/{1.73_m2} (ref >59)

## 2023-09-30 LAB — THYROID PEROXIDASE ANTIBODY: Thyroperoxidase Ab SerPl-aCnc: <9 [IU]/mL (ref 0–34)

## 2023-09-30 LAB — CBC WITH DIFFERENTIAL/PLATELET
Basophils Absolute: 0 10*3/uL (ref 0.0–0.2)
Basos: 0 %
EOS (ABSOLUTE): 0.1 10*3/uL (ref 0.0–0.4)
Eos: 1 %
Hematocrit: 41.6 % (ref 34.0–46.6)
Hemoglobin: 13.1 g/dL (ref 11.1–15.9)
Immature Grans (Abs): 0 10*3/uL (ref 0.0–0.1)
Immature Granulocytes: 0 %
Lymphocytes Absolute: 3.9 10*3/uL — ABNORMAL HIGH (ref 0.7–3.1)
Lymphs: 35 %
MCH: 27.6 pg (ref 26.6–33.0)
MCHC: 31.5 g/dL (ref 31.5–35.7)
MCV: 88 fL (ref 79–97)
Monocytes Absolute: 1 10*3/uL — ABNORMAL HIGH (ref 0.1–0.9)
Monocytes: 9 %
Neutrophils Absolute: 6 10*3/uL (ref 1.4–7.0)
Neutrophils: 55 %
Platelets: 291 10*3/uL (ref 150–450)
RBC: 4.75 x10E6/uL (ref 3.77–5.28)
RDW: 13.8 % (ref 11.7–15.4)
WBC: 11.1 10*3/uL — ABNORMAL HIGH (ref 3.4–10.8)

## 2023-09-30 LAB — TSH+FREE T4
Free T4: 1 ng/dL (ref 0.82–1.77)
TSH: 1.3 u[IU]/mL (ref 0.450–4.500)

## 2023-09-30 LAB — LIPID PANEL
Chol/HDL Ratio: 3.7 {ratio} (ref 0.0–4.4)
Cholesterol, Total: 209 mg/dL — ABNORMAL HIGH (ref 100–199)
HDL: 56 mg/dL (ref >39)
LDL Chol Calc (NIH): 118 mg/dL — ABNORMAL HIGH (ref 0–99)
Triglycerides: 200 mg/dL — ABNORMAL HIGH (ref 0–149)
VLDL Cholesterol Cal: 35 mg/dL (ref 5–40)

## 2023-09-30 LAB — HEMOGLOBIN A1C
Est. average glucose Bld gHb Est-mCnc: 123 mg/dL
Hgb A1c MFr Bld: 5.9 % — ABNORMAL HIGH (ref 4.8–5.6)

## 2023-09-30 NOTE — Progress Notes (Signed)
Kelsey Vaughan,   Glucose looks ok. You are still in pre-diabetes range.  Thyroid looks great. No antibodies to thyroid.  Kidney function above 60 and good but did drop a little from last check Liver looks great.  Your bad cholesterol, LDL, is 118 and up from 81.  Your good cholesterol looks great. TG trending down.   Overall 10 year cardiovascular risk is still low at 1.1 percent.   [name] Kitchen.The 10-year ASCVD risk score (Arnett DK, et al., 2019) is: 1.1%   Values used to calculate the score:     Age: 47 years     Sex: Female     Is Non-Hispanic African American: Yes     Diabetic: No     Tobacco smoker: Yes     Systolic Blood Pressure: 104 mmHg     Is BP treated: No     HDL Cholesterol: 56 mg/dL     Total Cholesterol: 209 mg/dL

## 2023-10-06 ENCOUNTER — Other Ambulatory Visit: Payer: Self-pay | Admitting: Physician Assistant

## 2023-10-06 ENCOUNTER — Encounter: Payer: Self-pay | Admitting: Physician Assistant

## 2023-10-06 DIAGNOSIS — E049 Nontoxic goiter, unspecified: Secondary | ICD-10-CM | POA: Insufficient documentation

## 2023-10-06 DIAGNOSIS — E042 Nontoxic multinodular goiter: Secondary | ICD-10-CM | POA: Insufficient documentation

## 2023-10-06 NOTE — Progress Notes (Signed)
Illiana,   Multiple thyroid nodules.  2 of them meet biopsy criteria.  I will order this for [place] to take a look.

## 2023-10-07 ENCOUNTER — Other Ambulatory Visit: Payer: Self-pay | Admitting: Physician Assistant

## 2023-10-07 DIAGNOSIS — E042 Nontoxic multinodular goiter: Secondary | ICD-10-CM

## 2023-10-08 ENCOUNTER — Other Ambulatory Visit (HOSPITAL_BASED_OUTPATIENT_CLINIC_OR_DEPARTMENT_OTHER): Payer: Self-pay

## 2023-10-09 ENCOUNTER — Other Ambulatory Visit (HOSPITAL_BASED_OUTPATIENT_CLINIC_OR_DEPARTMENT_OTHER): Payer: Self-pay

## 2023-10-14 ENCOUNTER — Encounter: Payer: 59 | Admitting: Physician Assistant

## 2023-10-15 ENCOUNTER — Ambulatory Visit
Admission: RE | Admit: 2023-10-15 | Discharge: 2023-10-15 | Disposition: A | Discharge status: Home / Self Care | Payer: 59 | Source: Ambulatory Visit | Attending: Physician Assistant | Admitting: Physician Assistant

## 2023-10-15 ENCOUNTER — Other Ambulatory Visit (HOSPITAL_COMMUNITY)
Admission: RE | Admit: 2023-10-15 | Discharge: 2023-10-15 | Disposition: A | Discharge status: Home / Self Care | Payer: 59 | Source: Ambulatory Visit | Attending: Physician Assistant | Admitting: Physician Assistant

## 2023-10-15 DIAGNOSIS — E041 Nontoxic single thyroid nodule: Secondary | ICD-10-CM | POA: Insufficient documentation

## 2023-10-15 DIAGNOSIS — E042 Nontoxic multinodular goiter: Secondary | ICD-10-CM

## 2023-10-16 LAB — CYTOLOGY - NON PAP

## 2023-10-17 NOTE — Progress Notes (Signed)
GREAT news. Thyroid biopsy showed benign cells. No cancer.

## 2023-10-27 ENCOUNTER — Other Ambulatory Visit: Payer: Self-pay | Admitting: Physician Assistant

## 2023-10-27 DIAGNOSIS — L819 Disorder of pigmentation, unspecified: Secondary | ICD-10-CM

## 2023-10-27 NOTE — Progress Notes (Signed)
Calls in with hypopigmentation spots over body that do not itch or having any scales associated with them.  Would like dermatology referral.

## 2023-10-28 ENCOUNTER — Ambulatory Visit: Payer: 59 | Attending: Cardiovascular Disease | Admitting: Cardiovascular Disease

## 2023-10-28 ENCOUNTER — Other Ambulatory Visit (HOSPITAL_BASED_OUTPATIENT_CLINIC_OR_DEPARTMENT_OTHER): Payer: Self-pay

## 2023-10-28 ENCOUNTER — Encounter: Payer: Self-pay | Admitting: Cardiovascular Disease

## 2023-10-28 VITALS — BP 116/80 | HR 99 | Ht 63.0 in | Wt 188.6 lb

## 2023-10-28 DIAGNOSIS — E781 Pure hyperglyceridemia: Secondary | ICD-10-CM

## 2023-10-28 DIAGNOSIS — R55 Syncope and collapse: Secondary | ICD-10-CM

## 2023-10-28 DIAGNOSIS — E782 Mixed hyperlipidemia: Secondary | ICD-10-CM

## 2023-10-28 DIAGNOSIS — R0602 Shortness of breath: Secondary | ICD-10-CM | POA: Diagnosis not present

## 2023-10-28 DIAGNOSIS — I480 Paroxysmal atrial fibrillation: Secondary | ICD-10-CM

## 2023-10-28 DIAGNOSIS — I479 Paroxysmal tachycardia, unspecified: Secondary | ICD-10-CM | POA: Diagnosis not present

## 2023-10-28 DIAGNOSIS — F419 Anxiety disorder, unspecified: Secondary | ICD-10-CM

## 2023-10-28 DIAGNOSIS — R002 Palpitations: Secondary | ICD-10-CM | POA: Diagnosis not present

## 2023-10-28 DIAGNOSIS — E042 Nontoxic multinodular goiter: Secondary | ICD-10-CM

## 2023-10-28 MED ORDER — ROSUVASTATIN CALCIUM 10 MG PO TABS
10.0000 mg | ORAL_TABLET | Freq: Every day | ORAL | 3 refills | Status: DC
  Filled 2023-10-28 – 2023-12-23 (×2): qty 90, 90d supply, fill #0

## 2023-10-28 MED ORDER — NEBIVOLOL HCL 5 MG PO TABS
7.5000 mg | ORAL_TABLET | Freq: Two times a day (BID) | ORAL | 3 refills | Status: DC
  Filled 2023-10-28 – 2023-12-24 (×4): qty 90, 30d supply, fill #0

## 2023-10-28 NOTE — Progress Notes (Signed)
Cardiology Office Note    Date:  11/03/2023   ID:  Kelsey Vaughan, DOB 11-Jun-1976, MRN [id number]  PCP:  Jomarie Longs, PA-C  Cardiologist:  Nicki Guadalajara, MD   New cardiology consultation referred [facility], [facility]   History of Present Illness:  Kelsey Vaughan is a 46 y.o. female who remotely was last evaluated by Dr. Olga Millers in January 2021.  She has a history of palpitations and PAF and an echo in April 2017 showed normal LV function.  She wore an event monitor in 2018 which showed sinus rhythm with PVCs.  Bystolic was increased with palpitations.  She underwent a sleep study in November 2018 which did not reveal any significant sleep apnea with an AHI of Vaughan.2/h.  He last evaluated her in a telemedicine virtual visit in January 2021 at which time she was no longer taking Bystolic but she had experienced worsening palpitations.  She was on aspirin with a CHADS2 score of Vaughan.  It was recommended she resume Bystolic at 2.5 mg daily.  He recommended she undergo calcium scoring particularly with coronary disease.  Calcium score was 0 on January 04, 2020.  Recently, she has experienced episodes of dizziness particularly when she lays down and continues to experience some palpitations.  Often times she has awakened from sleep with palpitations and recently admits that at times she also has awaken gasping for breath.  Presently, she is on nebivolol 5 mg in the morning and at bedtime.  She takes Belsomra at bedtime.  Due to anxiety she also is on duloxetine.  She has a tobacco history and currently is using nicotine patch.  She was recently evaluated by Tandy Gaw, PA-C on August 13, 2023.  She has a history of migraine headaches.  She had complained of presyncope but did not have frank syncope.  Due to concern for possible DVT she underwent venous duplex imaging which did not reveal any DVT in the right lower extremity.  She recently underwent an echo Doppler study on September 24, 2023 which showed EF 60 to 65% with grade Vaughan diastolic dysfunction.  There was no significant valvular pathology.  She now presents for cardiology consultation and follow-up of her history of PAF.    Past Medical History:  Diagnosis Date   Allergy    Anxiety    Arthritis    Atrial fibrillation (HCC)    a. occuring in 02/2016   B12 deficiency    Complication of anesthesia    Constipation    Foreign body in foot, left 07/2013   GERD (gastroesophageal reflux disease)    Hematuria    Irregular menses    due to oral contraceptive   Palpitations    a. 01/2017: Event monitor showing SR with PVC's.    PONV (postoperative nausea and vomiting)    Sinus tachycardia    Thyroid nodule    Vitamin D deficiency     Past Surgical History:  Procedure Laterality Date   CESAREAN SECTION     FOREIGN BODY REMOVAL Left 08/05/2013   Procedure: LEFT FOOT FOREIGN BODY REMOVAL ADULT;  Surgeon: Toni Arthurs, MD;  Location: [facility];  Service: Orthopedics;  Laterality: Left;   SHOULDER ARTHROSCOPY Bilateral    TUBAL LIGATION      Current Medications: Outpatient Medications Prior to Visit  Medication Sig Dispense Refill   aspirin EC 81 MG tablet Take Vaughan tablet (81 mg total) by mouth daily.  desogestrel-ethinyl estradiol (ISIBLOOM) 0.15-30 MG-MCG tablet Take Vaughan tablet by mouth daily. 84 tablet Vaughan   DULoxetine (CYMBALTA) 30 MG capsule Take Vaughan capsule (30 mg total) by mouth daily. 90 capsule 3   levocetirizine (XYZAL) 5 MG tablet Take Vaughan tablet (5 mg total) by mouth every evening. 90 tablet Vaughan   mometasone (NASONEX) 50 MCG/ACT nasal spray One spray in each nostril twice a day, use left hand for right nostril, and right hand for left nostril.  Please dispense one bottle. Vaughan g 6   nicotine (NICODERM CQ) 21 mg/24hr patch Place Vaughan patch (21 mg total) onto the skin daily. 28 patch Vaughan   SUMAtriptan (IMITREX) 100 MG tablet TAKE Vaughan TABLET (100 MG TOTAL) BY MOUTH ONCE AS NEEDED FOR UP TO Vaughan DOSE FOR  MIGRAINE. MAY REPEAT IN 2 HOURS IF HEADACHE PERSISTS OR RECURS. 10 tablet 5   Suvorexant (BELSOMRA) 10 MG TABS Take Vaughan tablet (10 mg total) by mouth at bedtime. 30 tablet Vaughan   valACYclovir (VALTREX) 1000 MG tablet Take Vaughan tablet (Vaughan,000 mg total) by mouth 2 (two) times daily. 14 tablet 11   nebivolol (BYSTOLIC) 5 MG tablet Take Vaughan tablet (5 mg total) by mouth in the morning and at bedtime. 180 tablet 3   No facility-administered medications prior to visit.     Allergies:   Influenza vaccines, Latex, Vyvanse [lisdexamfetamine dimesylate], Wellbutrin [bupropion], Hydrocodone, and Hydrocodone-acetaminophen   Social History   Socioeconomic History   Marital status: Divorced    Spouse name: Not on file   Number of children: 3   Years of education: Not on file   Highest education level: Associate degree: occupational, Scientist, product/process development, or vocational program  Occupational History   Occupation: Medical laboratory scientific officer: [employer]  Tobacco Use   Smoking status: Every Day    Current packs/day: 0.25    Average packs/day: 0.3 packs/day for 6.0 years (Vaughan.5 ttl pk-yrs)    Types: Cigarettes   Smokeless tobacco: Never   Tobacco comments:    4-5 cigs/day  Vaping Use   Vaping status: Never Used  Substance and Sexual Activity   Alcohol use: Yes    Comment: occasionally   Drug use: No   Sexual activity: Not Currently    Birth control/protection: Surgical, Pill    Comment: tubal ligation  Other Topics Concern   Not on file  Social History Narrative   Not on file   Social Determinants of Health   Financial Resource Strain: High Risk (09/28/2023)   Overall Financial Resource Strain (CARDIA)    Difficulty of Paying Living Expenses: Very hard  Food Insecurity: No Food Insecurity (09/28/2023)   Hunger Vital Sign    Worried About Running Out of Food in the Last Year: Never true    Ran Out of Food in the Last Year: Never true  Transportation Needs: No Transportation Needs (09/28/2023)    PRAPARE - Administrator, Civil Service (Medical): No    Lack of Transportation (Non-Medical): No  Physical Activity: Sufficiently Active (09/28/2023)   Exercise Vital Sign    Days of Exercise per Week: 7 days    Minutes of Exercise per Session: 150+ min  Stress: Stress Concern Present (09/28/2023)   [employer] of Occupational Health - Occupational Stress Questionnaire    Feeling of Stress : Very much  Social Connections: Moderately Isolated (09/28/2023)   Social Connection and Isolation Panel [NHANES]    Frequency of Communication with Friends and Family: More than  three times a week    Frequency of Social Gatherings with Friends and Family: Patient declined    Attends Religious Services: Vaughan to 4 times per year    Active Member of [employer] or Organizations: No    Attends Engineer, structural: Not on file    Marital Status: Divorced     Social history is notable that she was born in [place].  She is divorced since 2010.  She has 3 children and 3 grandchildren.  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  She had smoked, and currently is using nicotine patch.  She does not routinely exercise.  There is no alcohol or drug use.  Family History:  The patient's family history includes CAD in her father and sister; Diabetes in her mother; Heart attack in her father; Hypertension in her mother; Kidney disease in her mother.   Her mother is living at age 66 and has diabetes, hypertension and prior stroke.  Father died at age 64 with heart failure following an MI.  She has a brother in his late 17s and 2 sisters.  A sister age 62 has undergone coronary stenting.  ROS General: Negative; No fevers, chills, or night sweats;  HEENT: Negative; No changes in vision or hearing, sinus congestion, difficulty swallowing Pulmonary: Negative; No cough, wheezing, shortness of breath, hemoptysis Cardiovascular: See HPI GI: Negative; No nausea,  vomiting, diarrhea, or abdominal pain GU: Negative; No dysuria, hematuria, or difficulty voiding Musculoskeletal: Negative; no myalgias, joint pain, or weakness Hematologic/Oncology: Negative; no easy bruising, bleeding Endocrine: Thyroid nodule status post biopsy Neuro: Negative; no changes in balance, headaches Skin: Negative; No rashes or skin lesions Psychiatric: Negative; No behavioral problems, depression Sleep: Negative; No snoring, daytime sleepiness, hypersomnolence, bruxism, restless legs, hypnogognic hallucinations, no cataplexy Other comprehensive 14 point system review is negative.   PHYSICAL EXAM:   VS:  BP 116/80 (BP Location: Left Arm, Patient Position: Sitting, Cuff Size: Normal)   Pulse 99   Ht 5\' 3"  (Vaughan.6 m)   Wt 188 lb 9.6 oz (85.5 kg)   SpO2 94%   BMI 33.41 kg/m     Blood pressure by me was 134/80.  Wt Readings from Last 3 Encounters:  10/28/23 188 lb 9.6 oz (85.5 kg)  09/29/23 188 lb (85.3 kg)  06/03/23 184 lb (83.5 kg)    General: Alert, oriented, no distress.  Skin: normal turgor, no rashes, warm and dry HEENT: Normocephalic, atraumatic. Pupils equal round and reactive to light; sclera anicteric; extraocular muscles intact;  Nose without nasal septal hypertrophy Mouth/Parynx benign; Mallinpatti scale 3 Neck: No JVD, no carotid bruits; normal carotid upstroke Lungs: clear to ausculatation and percussion; no wheezing or rales Chest wall: without tenderness to palpitation Heart: PMI not displaced, RRR, s1 s2 normal, Vaughan/6 systolic murmur, no diastolic murmur, no rubs, gallops, thrills, or heaves Abdomen: soft, nontender; no hepatosplenomehaly, BS+; abdominal aorta nontender and not dilated by palpation. Back: no CVA tenderness Pulses 2+ Musculoskeletal: full range of motion, normal strength, no joint deformities Extremities: no clubbing cyanosis or edema, Homan's sign negative  Neurologic: grossly nonfocal; Cranial nerves grossly wnl Psychologic: Normal  mood and affect   Studies/Labs Reviewed:    EKG Interpretation Date/Time:  Tuesday October 28 2023 08:39:16 EST Ventricular Rate:  99 PR Interval:  152 QRS Duration:  68 QT Interval:  360 QTC Calculation: 462 R Axis:   8  Text Interpretation: Normal sinus rhythm Cannot rule out Anterior infarct ,  age undetermined When compared with ECG of 31-Jan-2020 12:01, PREVIOUS ECG IS PRESENT Confirmed by Nicki Guadalajara ([id number]) on 10/28/2023 9:22:32 AM    Recent Labs:    Latest Ref Rng & Units 09/29/2023   11:08 AM 2/Vaughan/2024   11:36 AM 11/23/2020    9:36 AM  BMP  Glucose 70 - 99 mg/dL 93  83  604   BUN 6 - 24 mg/dL 12  12  10    Creatinine 0.57 - Vaughan.00 mg/dL 5.40  9.81  Vaughan.91   BUN/Creat Ratio 9 - 23 12  13   NOT APPLICABLE   Sodium 134 - 144 mmol/L 141  140  139   Potassium 3.5 - 5.2 mmol/L 4.4  4.4  3.5   Chloride 96 - 106 mmol/L 106  105  106   CO2 20 - 29 mmol/L 19  22  27    Calcium 8.7 - 10.2 mg/dL 9.4  9.Vaughan  9.Vaughan         Latest Ref Rng & Units 09/29/2023   11:08 AM 2/Vaughan/2024   11:36 AM 11/23/2020    9:36 AM  Hepatic Function  Total Protein 6.0 - 8.5 g/dL 7.2  7.2  6.9   Albumin 3.9 - 4.9 g/dL 4.Vaughan  4.0    AST 0 - 40 IU/L 11  13  15    ALT 0 - 32 IU/L 13  12  21    Alk Phosphatase 44 - 121 IU/L 93  81    Total Bilirubin 0.0 - Vaughan.2 mg/dL 0.2  <4.7  0.3        Latest Ref Rng & Units 09/29/2023   11:08 AM 2/Vaughan/2024   11:36 AM 11/23/2020    9:36 AM  CBC  WBC 3.4 - 10.8 x10E3/uL 11.Vaughan  10.9  9.7   Hemoglobin 11.Vaughan - 15.9 g/dL 82.9  56.2  13.0   Hematocrit 34.0 - 46.6 % 41.6  40.Vaughan  37.6   Platelets 150 - 450 x10E3/uL 291  306  342    Lab Results  Component Value Date   MCV 88 09/29/2023   MCV 86 01/16/2023   MCV 84.9 11/23/2020   Lab Results  Component Value Date   TSH Vaughan.300 09/29/2023   Lab Results  Component Value Date   HGBA1C 5.9 (H) 09/29/2023     BNP No results found for: "BNP"  ProBNP No results found for: "PROBNP"   Lipid Panel     Component Value  Date/Time   CHOL 209 (H) 09/29/2023 1108   TRIG 200 (H) 09/29/2023 1108   HDL 56 09/29/2023 1108   CHOLHDL 3.7 09/29/2023 1108   CHOLHDL 4.2 11/23/2020 0936   LDLCALC 118 (H) 09/29/2023 1108   LDLCALC 118 (H) 11/23/2020 0936   LABVLDL 35 09/29/2023 1108     RADIOLOGY: [place] FNA BX THYROID 1ST LESION AFIRMA  Result Date: 10/15/2023 INDICATION: Nodule # 3 is a 2.0 cm TI-RADS category 5 nodule in the left lower gland and also meets criteria to consider fine-needle aspiration biopsy.; Nodule # 2 is a Vaughan.9 cm TI-RADS category 4 nodule in the right lower gland and meets criteria to consider fine-needle aspiration biopsy. EXAM: ULTRASOUND GUIDED FINE NEEDLE ASPIRATION OF INDETERMINATE THYROID NODULE COMPARISON:  Thyroid ultrasound 09/30/2023. MEDICATIONS: Vaughan% lidocaine. COMPLICATIONS: None immediate. TECHNIQUE: Informed written consent was obtained from the patient after a discussion of the risks, benefits and alternatives to treatment. Questions regarding the procedure were encouraged and answered. A timeout was performed prior to the initiation of the procedure.  Pre-procedural ultrasound scanning demonstrated unchanged size and appearance of the indeterminate nodules within the inferior aspect of the left thyroid gland at mid depth and inferior aspect of the right thyroid gland posterior depth. The procedure was planned. The neck was prepped in the usual sterile fashion, and a sterile drape was applied covering the operative field. A timeout was performed prior to the initiation of the procedure. Local anesthesia was provided with Vaughan% lidocaine. Under direct ultrasound guidance, 4 FNA biopsies were performed of the Vaughan.7 cm nodule in the inferior aspect of the left thyroid lobe at mid depth with a 25 gauge needle. Multiple ultrasound images were saved for procedural documentation purposes. The samples were prepared and submitted to pathology. Under direct ultrasound guidance, 5 FNA biopsies were performed of the  Vaughan.9 cm nodule in the inferior aspect of the right thyroid lobe at posterior depth with a 25 gauge needle. Multiple ultrasound images were saved for procedural documentation purposes. The samples were prepared and submitted to pathology. Limited post procedural scanning was negative for hematoma or additional complication. Dressings were placed. The patient tolerated the above procedures procedure well without immediate postprocedural complication. FINDINGS: Nodule reference number based on prior diagnostic ultrasound: 3 Maximum size: Vaughan.7 cm Location: Left; Inferior ACR TI-RADS risk category: TR5 (>/= 7 points) Reason for biopsy: meets ACR TI-RADS criteria _________________________________________________________ Nodule reference number based on prior diagnostic ultrasound: 2 Maximum size: Vaughan.9 cm Location: Right; Inferior ACR TI-RADS risk category: TR4 (4-6 points) Reason for biopsy: meets ACR TI-RADS criteria Ultrasound imaging confirms appropriate placement of the needles within the thyroid nodule. IMPRESSION: Vaughan. Technically successful ultrasound guided fine needle aspiration of nodule 3 in the inferior aspect of the left thyroid lobe. 2. Technically successful ultrasound guided fine needle aspiration of nodule 2 in the inferior aspect of the right thyroid lobe. Electronically Signed   By: Orvan Falconer M.D.   On: 10/15/2023 16:33   [place] FNA BX THYROID 1ST LESION AFIRMA  Result Date: 10/15/2023 INDICATION: Nodule # 3 is a 2.0 cm TI-RADS category 5 nodule in the left lower gland and also meets criteria to consider fine-needle aspiration biopsy.; Nodule # 2 is a Vaughan.9 cm TI-RADS category 4 nodule in the right lower gland and meets criteria to consider fine-needle aspiration biopsy. EXAM: ULTRASOUND GUIDED FINE NEEDLE ASPIRATION OF INDETERMINATE THYROID NODULE COMPARISON:  Thyroid ultrasound 09/30/2023. MEDICATIONS: Vaughan% lidocaine. COMPLICATIONS: None immediate. TECHNIQUE: Informed written consent was obtained from  the patient after a discussion of the risks, benefits and alternatives to treatment. Questions regarding the procedure were encouraged and answered. A timeout was performed prior to the initiation of the procedure. Pre-procedural ultrasound scanning demonstrated unchanged size and appearance of the indeterminate nodules within the inferior aspect of the left thyroid gland at mid depth and inferior aspect of the right thyroid gland posterior depth. The procedure was planned. The neck was prepped in the usual sterile fashion, and a sterile drape was applied covering the operative field. A timeout was performed prior to the initiation of the procedure. Local anesthesia was provided with Vaughan% lidocaine. Under direct ultrasound guidance, 4 FNA biopsies were performed of the Vaughan.7 cm nodule in the inferior aspect of the left thyroid lobe at mid depth with a 25 gauge needle. Multiple ultrasound images were saved for procedural documentation purposes. The samples were prepared and submitted to pathology. Under direct ultrasound guidance, 5 FNA biopsies were performed of the Vaughan.9 cm nodule in the inferior aspect of the right thyroid lobe at posterior  depth with a 25 gauge needle. Multiple ultrasound images were saved for procedural documentation purposes. The samples were prepared and submitted to pathology. Limited post procedural scanning was negative for hematoma or additional complication. Dressings were placed. The patient tolerated the above procedures procedure well without immediate postprocedural complication. FINDINGS: Nodule reference number based on prior diagnostic ultrasound: 3 Maximum size: Vaughan.7 cm Location: Left; Inferior ACR TI-RADS risk category: TR5 (>/= 7 points) Reason for biopsy: meets ACR TI-RADS criteria _________________________________________________________ Nodule reference number based on prior diagnostic ultrasound: 2 Maximum size: Vaughan.9 cm Location: Right; Inferior ACR TI-RADS risk category: TR4 (4-6  points) Reason for biopsy: meets ACR TI-RADS criteria Ultrasound imaging confirms appropriate placement of the needles within the thyroid nodule. IMPRESSION: Vaughan. Technically successful ultrasound guided fine needle aspiration of nodule 3 in the inferior aspect of the left thyroid lobe. 2. Technically successful ultrasound guided fine needle aspiration of nodule 2 in the inferior aspect of the right thyroid lobe. Electronically Signed   By: Orvan Falconer M.D.   On: 10/15/2023 16:33     Additional studies/ records that were reviewed today include:   I reviewed prior records of Dr. Jens Som.  2018 sleep study was reviewed.  Prior calcium score was reviewed.    ASSESSMENT:    Vaughan. Tachycardia, paroxysmal (HCC)   2. Palpitations   3. SOB (shortness of breath)   4. Mixed hyperlipidemia   5. Paroxysmal atrial fibrillation (HCC)   6. Near syncope   7. Anxiety   8. Multiple thyroid nodules     PLAN:  Kelsey Vaughan is a 47 year old female who has a history of paroxysmal atrial fibrillation and has been on aspirin therapy with a low CHA2DS2-VASc score of Vaughan.  She has experienced periods of palpitations and has been treated with nebivolol.  Recently, she admits that she has experienced slightly more palpitations and particularly seems to wake up from sleep with palpitations.  She also admits that she has recently been awakening from sleep gasping for breath.  She has undergone prior echocardiographic evaluations in 2017 and more recently in March 2021 which showed EF 55 to 60% with grade Vaughan diastolic dysfunction and mild LVH.  Remotely, in 2018 she had undergone a sleep study which was negative for sleep apnea.  There is a tobacco history as well as family history for heart disease both in her father at a young age having an MI and dying of heart failure at age 80 and a sister in her 57s also undergoing coronary stenting.  With her increased palpitations, I am recommending titration of Bystolic from 5  mg twice a day to 7.5 mg twice a day.  I have recommended she undergo a follow-up calcium score for reassessment.  I reviewed recent laboratory which showed total cholesterol 209, triglycerides 200, LDL cholesterol 118 on September 29, 2023.  She was mildly prediabetic with hemoglobin A1c 5.9.  Creatinine was Vaughan.02.  With her significant family history, I am initiating low-dose statin therapy with rosuvastatin 10 mg.  In 3 months I will recheck chemistry, lipid panel and will also check LP(a).  With her recent history of awakening gasping for breath as well as nocturnal induced palpitations I have recommended a 6-year follow-up sleep evaluation and we will schedule this for an in-lab study.  I discussed the importance of weight loss and exercise.  I will see her in 4 months for reevaluation or sooner as needed.  Medication Adjustments/Labs and Tests Ordered: Current medicines are reviewed at  length with the patient today.  Concerns regarding medicines are outlined above.  Medication changes, Labs and Tests ordered today are listed in the Patient Instructions below. Patient Instructions  Medication Instructions:  INCREASE THE BYSTOLIC TO 7.5MG  DAILY.  BEGIN ROSUVASTATIN 10MG  DAILY *If you need a refill on your cardiac medications before your next appointment, please call your pharmacy*   Lab Work: IN 3 MONTHS YOU WILL RETURN TO HAVE FASTING LAB CMET, LIPID, LPa If you have labs (blood work) drawn today and your tests are completely normal, you will receive your results only by: MyChart Message (if you have MyChart) OR A paper copy in the mail If you have any lab test that is abnormal or we need to change your treatment, we will call you to review the results.   Testing/Procedures: CALCIUM SCORE  Your physician has recommended that you have a sleep study. This test records several body functions during sleep, including: brain activity, eye movement, oxygen and carbon dioxide blood levels, heart  rate and rhythm, breathing rate and rhythm, the flow of air through your mouth and nose, snoring, body muscle movements, and chest and belly movement.    Follow-Up: At [facility], you and your health needs are our priority.  As part of our continuing mission to provide you with exceptional heart care, we have created designated Provider Care Teams.  These Care Teams include your primary Cardiologist (physician) and Advanced Practice Providers (APPs -  Physician Assistants and Nurse Practitioners) who all work together to provide you with the care you need, when you need it.  We recommend signing up for the patient portal called "MyChart".  Sign up information is provided on this After Visit Summary.  MyChart is used to connect with patients for Virtual Visits (Telemedicine).  Patients are able to view lab/test results, encounter notes, upcoming appointments, etc.  Non-urgent messages can be sent to your provider as well.   To learn more about what you can do with MyChart, go to [email or web address].    Your next appointment:   4 month(s)  Provider:   DR. Nicki Guadalajara      Signed, Nicki Guadalajara, MD  11/03/2023 11:19 AM    [facility] [address], [place], [place]  [address] Phone: [phone]

## 2023-10-28 NOTE — Patient Instructions (Signed)
Medication Instructions:  INCREASE THE BYSTOLIC TO 7.5MG  DAILY.  BEGIN ROSUVASTATIN 10MG  DAILY *If you need a refill on your cardiac medications before your next appointment, please call your pharmacy*   Lab Work: IN 3 MONTHS YOU WILL RETURN TO HAVE FASTING LAB CMET, LIPID, LPa If you have labs (blood work) drawn today and your tests are completely normal, you will receive your results only by: MyChart Message (if you have MyChart) OR A paper copy in the mail If you have any lab test that is abnormal or we need to change your treatment, we will call you to review the results.   Testing/Procedures: CALCIUM SCORE  Your physician has recommended that you have a sleep study. This test records several body functions during sleep, including: brain activity, eye movement, oxygen and carbon dioxide blood levels, heart rate and rhythm, breathing rate and rhythm, the flow of air through your mouth and nose, snoring, body muscle movements, and chest and belly movement.    Follow-Up: At [facility], you and your health needs are our priority.  As part of our continuing mission to provide you with exceptional heart care, we have created designated Provider Care Teams.  These Care Teams include your primary Cardiologist (physician) and Advanced Practice Providers (APPs -  Physician Assistants and Nurse Practitioners) who all work together to provide you with the care you need, when you need it.  We recommend signing up for the patient portal called "MyChart".  Sign up information is provided on this After Visit Summary.  MyChart is used to connect with patients for Virtual Visits (Telemedicine).  Patients are able to view lab/test results, encounter notes, upcoming appointments, etc.  Non-urgent messages can be sent to your provider as well.   To learn more about what you can do with MyChart, go to [email or web address].    Your next appointment:   4 month(s)  Provider:   DR. Nicki Guadalajara

## 2023-10-29 ENCOUNTER — Other Ambulatory Visit (HOSPITAL_BASED_OUTPATIENT_CLINIC_OR_DEPARTMENT_OTHER): Payer: Self-pay

## 2023-10-30 ENCOUNTER — Other Ambulatory Visit (HOSPITAL_BASED_OUTPATIENT_CLINIC_OR_DEPARTMENT_OTHER): Payer: Self-pay

## 2023-10-31 ENCOUNTER — Other Ambulatory Visit: Payer: Self-pay

## 2023-10-31 ENCOUNTER — Other Ambulatory Visit (HOSPITAL_BASED_OUTPATIENT_CLINIC_OR_DEPARTMENT_OTHER): Payer: Self-pay

## 2023-11-03 ENCOUNTER — Encounter: Payer: Self-pay | Admitting: Cardiovascular Disease

## 2023-11-03 ENCOUNTER — Other Ambulatory Visit (HOSPITAL_BASED_OUTPATIENT_CLINIC_OR_DEPARTMENT_OTHER): Payer: Self-pay

## 2023-11-04 ENCOUNTER — Other Ambulatory Visit (HOSPITAL_BASED_OUTPATIENT_CLINIC_OR_DEPARTMENT_OTHER): Payer: Self-pay

## 2023-11-05 ENCOUNTER — Other Ambulatory Visit (HOSPITAL_BASED_OUTPATIENT_CLINIC_OR_DEPARTMENT_OTHER): Payer: Self-pay

## 2023-11-07 ENCOUNTER — Other Ambulatory Visit (HOSPITAL_BASED_OUTPATIENT_CLINIC_OR_DEPARTMENT_OTHER): Payer: Self-pay

## 2023-11-10 ENCOUNTER — Other Ambulatory Visit (HOSPITAL_BASED_OUTPATIENT_CLINIC_OR_DEPARTMENT_OTHER): Payer: Self-pay

## 2023-11-18 ENCOUNTER — Ambulatory Visit (HOSPITAL_COMMUNITY): Payer: Medicaid Other

## 2023-11-25 DIAGNOSIS — L209 Atopic dermatitis, unspecified: Secondary | ICD-10-CM | POA: Diagnosis not present

## 2023-11-26 ENCOUNTER — Other Ambulatory Visit (HOSPITAL_BASED_OUTPATIENT_CLINIC_OR_DEPARTMENT_OTHER): Payer: Self-pay

## 2023-11-26 ENCOUNTER — Encounter (HOSPITAL_BASED_OUTPATIENT_CLINIC_OR_DEPARTMENT_OTHER): Payer: Self-pay

## 2023-11-26 ENCOUNTER — Other Ambulatory Visit: Payer: Self-pay

## 2023-11-26 ENCOUNTER — Encounter: Payer: Self-pay | Admitting: Physician Assistant

## 2023-11-26 ENCOUNTER — Ambulatory Visit (INDEPENDENT_AMBULATORY_CARE_PROVIDER_SITE_OTHER): Payer: 59 | Admitting: Physician Assistant

## 2023-11-26 VITALS — BP 118/76 | HR 69 | Ht 62.0 in | Wt 189.7 lb

## 2023-11-26 DIAGNOSIS — M47816 Spondylosis without myelopathy or radiculopathy, lumbar region: Secondary | ICD-10-CM | POA: Diagnosis not present

## 2023-11-26 DIAGNOSIS — R4589 Other symptoms and signs involving emotional state: Secondary | ICD-10-CM | POA: Diagnosis not present

## 2023-11-26 DIAGNOSIS — G8929 Other chronic pain: Secondary | ICD-10-CM | POA: Diagnosis not present

## 2023-11-26 DIAGNOSIS — F411 Generalized anxiety disorder: Secondary | ICD-10-CM

## 2023-11-26 DIAGNOSIS — M5442 Lumbago with sciatica, left side: Secondary | ICD-10-CM

## 2023-11-26 DIAGNOSIS — F410 Panic disorder [episodic paroxysmal anxiety] without agoraphobia: Secondary | ICD-10-CM | POA: Diagnosis not present

## 2023-11-26 DIAGNOSIS — E66811 Obesity, class 1: Secondary | ICD-10-CM | POA: Diagnosis not present

## 2023-11-26 MED ORDER — DULOXETINE HCL 30 MG PO CPEP
30.0000 mg | ORAL_CAPSULE | Freq: Every day | ORAL | 0 refills | Status: DC
  Filled 2023-11-26: qty 90, 90d supply, fill #0

## 2023-11-26 MED ORDER — ZEPBOUND 2.5 MG/0.5ML ~~LOC~~ SOAJ
2.5000 mg | SUBCUTANEOUS | 0 refills | Status: DC
  Filled 2023-11-26 – 2023-12-23 (×2): qty 2, 28d supply, fill #0

## 2023-11-26 NOTE — Progress Notes (Signed)
Established Patient Office Visit  Subjective   Patient ID: Kelsey Vaughan, female    DOB: 28-Oct-1976  Age: 47 y.o. MRN: [id number]  Chief Complaint  Patient presents with   Medical Management of Chronic Issues    mood    HPI 47 yo female presents today for depression f/u. She never started Cymbalta 30 mg because could not afford it. She has Medicaid now which should cover it now. She wants to try it. PMH MDD, panic disorder, GAD, binge-eating disorder, and bipolar I, She sometimes feels like she would be better off dead but has no plans. She would like to start therapy.  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 Lifting, walking, standing, sitting, twisting all makes pain worse. She is no longer working and is on [employer]. She had an EMG which confirmed this dx. Back pain and radiating sharp pain down left leg. Numbness/tingling as well. She wants something for this pain. She was put on muscle relaxer which did not work. She was referred to pain management 1-2 months and no one has called her. She is taking tylenol arthritis. She has thinks she has tried gabapentin and got hallucinations. She has had ~7 injections, which did not help. She would like a referral to neuro. She has tried massage, ice, and heat therapy, which did not help. Lumbar MRI on 06/02/21 findings: 1. Stable small central disc protrusion at T11-12 without resulting cord deformity. 2. Minimal lower lumbar disc degeneration and bulging with mild to moderate facet and ligamentous hypertrophy at L5-S1. No spinal stenosis or nerve root encroachment  Review of Systems  Musculoskeletal:  Positive for back pain.  Neurological:  Positive for tingling and sensory change.  Psychiatric/Behavioral:  Positive for depression. The patient is nervous/anxious.      Objective:     BP 118/76   Pulse 69   Ht 5\' 2"  (1.575  m)   Wt 189 lb 11.2 oz (86 kg)   SpO2 99%   BMI 34.70 kg/m    Physical Exam Cardiovascular:     Rate and Rhythm: Normal rate and regular rhythm.     Pulses: Normal pulses.     Heart sounds: Normal heart sounds.  Pulmonary:     Effort: Pulmonary effort is normal.     Breath sounds: Normal breath sounds.  Musculoskeletal:     Lumbar back: Tenderness and bony tenderness (SI joints TTP bilaterally) present. No swelling or deformity. Normal range of motion. Positive left straight leg raise test. Negative right straight leg raise test.     Right hip: No deformity or tenderness. Normal range of motion. Normal strength.     Left hip: No deformity or tenderness. Normal range of motion. Decreased strength.     Right upper leg: Normal.     Left upper leg: Normal.     Right knee: Normal.     Left knee: Normal.     Right lower leg: Normal.     Left lower leg: Normal.     Comments: Lower back pain with FABER.     .. Flowsheet Row Office Visit from 11/26/2023 in [facility] at [facility]  PHQ-9 Total Score 23      ..    11/26/2023   10:00 AM 09/29/2023   10:05 AM 03/26/2023   11:59 AM 01/27/2023    3:08 PM  GAD 7 :  Generalized Anxiety Score  Nervous, Anxious, on Edge 3 3 0 3  Control/stop worrying 3 3 0 2  Worry too much - different things 3 3 0 3  Trouble relaxing 3 3 0 3  Restless 3 2 0 3  Easily annoyed or irritable 3 3 0 3  Afraid - awful might happen 2 0 0 1  Total GAD 7 Score 20 17 0 18  Anxiety Difficulty Extremely difficult Extremely difficult Somewhat difficult Extremely difficult      The 10-year ASCVD risk score (Arnett DK, et al., 2019) is: 1.9%    Assessment & Plan:  [name] KitchenMarland KitchenKhrystine was seen today for medical management of chronic issues.  Diagnoses and all orders for this visit:  Obesity (BMI 30.0-34.9) -     tirzepatide (ZEPBOUND) 2.5 MG/0.5ML Pen; Inject 2.5 mg into the skin once a week.  GAD (generalized anxiety  disorder) -     DULoxetine (CYMBALTA) 30 MG capsule; Take 1 capsule (30 mg total) by mouth daily. -     Ambulatory referral to Psychology  Lumbar facet arthropathy -     tirzepatide (ZEPBOUND) 2.5 MG/0.5ML Pen; Inject 2.5 mg into the skin once a week. -     DULoxetine (CYMBALTA) 30 MG capsule; Take 1 capsule (30 mg total) by mouth daily. -     Ambulatory referral to Neurosurgery  Panic disorder -     DULoxetine (CYMBALTA) 30 MG capsule; Take 1 capsule (30 mg total) by mouth daily. -     Ambulatory referral to Psychology  Panic attacks -     DULoxetine (CYMBALTA) 30 MG capsule; Take 1 capsule (30 mg total) by mouth daily. -     Ambulatory referral to Psychology  Chronic left-sided low back pain with left-sided sciatica -     tirzepatide (ZEPBOUND) 2.5 MG/0.5ML Pen; Inject 2.5 mg into the skin once a week. -     DULoxetine (CYMBALTA) 30 MG capsule; Take 1 capsule (30 mg total) by mouth daily. -     Ambulatory referral to Neurosurgery  Depressed mood -     Ambulatory referral to Psychology    PHQ and GAD not well controlled. Patient will start taking Cymbalta 30 mg for depression, anxiety, and chronic pain. Discussed that antidepressants usually take 4-6 wks to see a difference. If Cymbalta does not work well after 4-6wks, consider increasing dose or adding another medication. Discussed with patient to call office if her symptoms worsen or if she develops suicidal ideations/plans. Discussed proper lifting techniques. Discussed that weight loss could help pain and mood as well. Prescribed Zepbound for weight loss; demonstrated how to use injector and confirmed pt understanding; discussed SEs of N/V and pancreatitis. Referral to mental therapy. Referral to neurosurgery.  F/u in 4 wks for depression, anxiety, and chronic pain.   Return in about 4 weeks (around 12/24/2023) for 4-6 weeks for F/U on depression/anxiety.    Tandy Gaw, PA-C

## 2023-11-26 NOTE — Patient Instructions (Addendum)
Referral to counseling and neurosurgery Start cymbalta for mood and pain control Start zepbound weekly for weight loss

## 2023-11-27 ENCOUNTER — Other Ambulatory Visit (HOSPITAL_BASED_OUTPATIENT_CLINIC_OR_DEPARTMENT_OTHER): Payer: Self-pay

## 2023-11-27 MED ORDER — MOMETASONE FUROATE 0.1 % EX CREA
1.0000 | TOPICAL_CREAM | Freq: Two times a day (BID) | CUTANEOUS | 1 refills | Status: DC
  Filled 2023-11-27: qty 45, 14d supply, fill #0
  Filled 2023-12-23: qty 45, 14d supply, fill #1

## 2023-11-28 ENCOUNTER — Other Ambulatory Visit (HOSPITAL_BASED_OUTPATIENT_CLINIC_OR_DEPARTMENT_OTHER): Payer: Self-pay

## 2023-12-01 ENCOUNTER — Other Ambulatory Visit (HOSPITAL_BASED_OUTPATIENT_CLINIC_OR_DEPARTMENT_OTHER): Payer: Self-pay

## 2023-12-01 ENCOUNTER — Encounter: Payer: Self-pay | Admitting: Physician Assistant

## 2023-12-02 ENCOUNTER — Other Ambulatory Visit (HOSPITAL_BASED_OUTPATIENT_CLINIC_OR_DEPARTMENT_OTHER): Payer: Self-pay

## 2023-12-04 ENCOUNTER — Other Ambulatory Visit (HOSPITAL_BASED_OUTPATIENT_CLINIC_OR_DEPARTMENT_OTHER): Payer: Self-pay

## 2023-12-05 ENCOUNTER — Other Ambulatory Visit (HOSPITAL_BASED_OUTPATIENT_CLINIC_OR_DEPARTMENT_OTHER): Payer: Self-pay

## 2023-12-08 ENCOUNTER — Other Ambulatory Visit (HOSPITAL_BASED_OUTPATIENT_CLINIC_OR_DEPARTMENT_OTHER): Payer: Self-pay

## 2023-12-09 ENCOUNTER — Other Ambulatory Visit (HOSPITAL_BASED_OUTPATIENT_CLINIC_OR_DEPARTMENT_OTHER): Payer: Self-pay

## 2023-12-11 ENCOUNTER — Other Ambulatory Visit: Payer: Self-pay

## 2023-12-11 ENCOUNTER — Other Ambulatory Visit (HOSPITAL_BASED_OUTPATIENT_CLINIC_OR_DEPARTMENT_OTHER): Payer: Self-pay

## 2023-12-16 ENCOUNTER — Telehealth: Payer: Self-pay | Admitting: *Deleted

## 2023-12-16 NOTE — Telephone Encounter (Signed)
Left patient a message to call and schedule annual. 

## 2023-12-22 ENCOUNTER — Ambulatory Visit: Payer: 59 | Admitting: Physician Assistant

## 2023-12-23 ENCOUNTER — Ambulatory Visit: Payer: 59 | Admitting: Physician Assistant

## 2023-12-23 ENCOUNTER — Other Ambulatory Visit: Payer: Self-pay | Admitting: Physician Assistant

## 2023-12-23 ENCOUNTER — Ambulatory Visit (INDEPENDENT_AMBULATORY_CARE_PROVIDER_SITE_OTHER): Payer: 59 | Admitting: Sports Medicine

## 2023-12-23 DIAGNOSIS — M722 Plantar fascial fibromatosis: Secondary | ICD-10-CM | POA: Diagnosis not present

## 2023-12-23 DIAGNOSIS — Z3009 Encounter for other general counseling and advice on contraception: Secondary | ICD-10-CM

## 2023-12-23 NOTE — Assessment & Plan Note (Signed)
 48 year old female returns, she has had chronic pain plantar aspect left foot deep in the arch, she has felt a nodule. On exam she has a palpable nodule mid plantar fascia consistent with plantar fascial fibromatosis. She does a lot of barefoot walking at home. We discussed the anatomy and pathophysiology, she understands to avoid barefoot walking and wear house slippers, adding air heel brace, home physical therapy, return to see me in 6 weeks, injection if not better.

## 2023-12-23 NOTE — Progress Notes (Signed)
    Procedures performed today:    None.  Independent interpretation of notes and tests performed by another provider:   None.  Brief History, Exam, Impression, and Recommendations:    Plantar fascial fibromatosis of left foot 48 year old female returns, she has had chronic pain plantar aspect left foot deep in the arch, she has felt a nodule. On exam she has a palpable nodule mid plantar fascia consistent with plantar fascial fibromatosis. She does a lot of barefoot walking at home. We discussed the anatomy and pathophysiology, she understands to avoid barefoot walking and wear house slippers, adding air heel brace, home physical therapy, return to see me in 6 weeks, injection if not better.    ____________________________________________ Debby PARAS. Curtis, M.D., ABFM., CAQSM., AME. Primary Care and Sports Medicine [facility]  Adjunct Professor of [facility] Medicine  [employer]  Restaurant Manager, [employer]

## 2023-12-24 ENCOUNTER — Other Ambulatory Visit (HOSPITAL_BASED_OUTPATIENT_CLINIC_OR_DEPARTMENT_OTHER): Payer: Self-pay

## 2023-12-24 ENCOUNTER — Telehealth: Payer: Self-pay

## 2023-12-24 ENCOUNTER — Other Ambulatory Visit: Payer: Self-pay

## 2023-12-24 ENCOUNTER — Encounter: Payer: Self-pay | Admitting: Physician Assistant

## 2023-12-24 ENCOUNTER — Ambulatory Visit: Payer: 59 | Admitting: Physician Assistant

## 2023-12-24 DIAGNOSIS — F411 Generalized anxiety disorder: Secondary | ICD-10-CM | POA: Diagnosis not present

## 2023-12-24 DIAGNOSIS — M47816 Spondylosis without myelopathy or radiculopathy, lumbar region: Secondary | ICD-10-CM

## 2023-12-24 DIAGNOSIS — F410 Panic disorder [episodic paroxysmal anxiety] without agoraphobia: Secondary | ICD-10-CM

## 2023-12-24 DIAGNOSIS — M5442 Lumbago with sciatica, left side: Secondary | ICD-10-CM | POA: Diagnosis not present

## 2023-12-24 DIAGNOSIS — G8929 Other chronic pain: Secondary | ICD-10-CM | POA: Diagnosis not present

## 2023-12-24 MED ORDER — DULOXETINE HCL 30 MG PO CPEP
30.0000 mg | ORAL_CAPSULE | Freq: Two times a day (BID) | ORAL | 0 refills | Status: DC
  Filled 2023-12-24: qty 180, 90d supply, fill #0

## 2023-12-24 NOTE — Progress Notes (Signed)
 Established Patient Office Visit  Subjective   Patient ID: Kelsey Vaughan, female    DOB: [birth year]  Age: 48 y.o. MRN: [id number]  Chief Complaint  Patient presents with   Medical Management of Chronic Issues    Mood phq gad, pt report Cymbalta  has helped with mood and back pain that she has seen an improvement since last visit     HPI Pt is a 48 yo female who presents to the clinic to follow up on cymbalta . She feels like it has helped with chronic back pain and mood. She is happy with her response. She does not feel like she is quite to goal but on her way to improving.   Patient Active Problem List   Diagnosis Date Noted   Multiple thyroid  nodules 10/06/2023   Thyroid  enlarged 10/06/2023   Primary insomnia 09/29/2023   SOB (shortness of breath) 08/15/2023   Non-recurrent acute serous otitis media of left ear 06/03/2023   Panic attacks 01/28/2023   Class 1 obesity due to excess calories without serious comorbidity with body mass index (BMI) of 33.0 to 33.9 in adult 01/28/2023   Pre-diabetes 01/27/2023   Panic disorder 07/22/2022   Blurry vision, bilateral 07/22/2022   Vision changes 01/07/2022   Near syncope 01/07/2022   Medication management 10/19/2021   Frontal lobe and executive function deficit 08/06/2021   Difficulty coping 08/06/2021   Bipolar 1 disorder, mixed, severe (HCC) 07/09/2021   Mood disorder (HCC) 07/09/2021   Severe episode of recurrent major depressive disorder, without psychotic features (HCC) 07/09/2021   Mass of left side of neck 06/12/2021   Abnormal urine color 06/12/2021   Left elbow pain 10/04/2020   Numbness and tingling of right arm 06/28/2020   Metatarsalgia of right foot 01/10/2020   Choking 11/17/2019   Dysphagia 11/17/2019   Patellofemoral pain syndrome of left knee 10/27/2019   Current smoker 10/22/2019   Anxiety 10/22/2019   Supraspinatus tendon tear 09/13/2019   Chronic left-sided low back pain with left-sided sciatica 06/02/2019    Impulsiveness 02/15/2019   Inattention 02/15/2019   Dry eye of right side 01/18/2019   Pterygium of right eye 01/18/2019   Cramping of feet 01/07/2019   Paresthesia 01/07/2019   Lumbar facet arthropathy 12/17/2018   Acute stress disorder 09/03/2018   Trouble in sleeping 09/03/2018   Fatigue 09/03/2018   Chronic glomerulonephritis 08/11/2018   Elevated fasting glucose 05/25/2018   Hypertriglyceridemia 05/25/2018   Binge-eating disorder, mild 05/20/2018   Irritability and anger 02/23/2018   Mood changes 12/24/2017   Irritable 12/24/2017   Vaginal discharge 11/04/2017   Vaginal irritation 11/04/2017   Lower abdominal pain 11/04/2017   Severe anxiety 10/14/2017   Stress reaction 10/12/2017   Frontal headache 09/16/2017   MDD (major depressive disorder), recurrent episode, mild (HCC) 09/03/2017   Insulin  resistance 07/30/2017   Epigastric abdominal pain 04/17/2017   Constipation 04/17/2017   Hematuria 04/17/2017   Chronic sinusitis 04/07/2017   Leiomyoma of uterus 02/26/2017   Renal cyst, right 02/26/2017   Sinus tachycardia 02/06/2017   Palpitations 01/29/2017   PAC (premature atrial contraction) 01/29/2017   Vitamin D  deficiency 12/23/2016   B12 deficiency 12/23/2016   Dysmenorrhea 12/19/2016   Bilateral cold feet 12/19/2016   New daily persistent headache 12/19/2016   GAD (generalized anxiety disorder) 12/19/2016   Tobacco dependence 12/19/2016   Tachycardia, paroxysmal (HCC) 03/07/2016   Microscopic hematuria 02/29/2016   Allergy to influenza vaccine 10/02/2015   Class 1 obesity due to excess calories without  serious comorbidity with body mass index (BMI) of 30.0 to 30.9 in adult 11/25/2014   Neck strain 11/04/2014   Sinus tarsi syndrome of left ankle 07/16/2013   Plantar fascial fibromatosis of left foot 07/15/2013   Past Medical History:  Diagnosis Date   Allergy    Anxiety    Arthritis    Atrial fibrillation (HCC)    a. occuring in 02/2016   B12 deficiency     Complication of anesthesia    Constipation    Foreign body in foot, left 07/2013   GERD (gastroesophageal reflux disease)    Hematuria    Irregular menses    due to oral contraceptive   Palpitations    a. 01/2017: Event monitor showing SR with PVC's.    PONV (postoperative nausea and vomiting)    Sinus tachycardia    Thyroid  nodule    Vitamin D  deficiency    Past Surgical History:  Procedure Laterality Date   CESAREAN SECTION     FOREIGN BODY REMOVAL Left 08/05/2013   Procedure: LEFT FOOT FOREIGN BODY REMOVAL ADULT;  Surgeon: Norleen Armor, MD;  Location:  SURGERY CENTER;  Service: Orthopedics;  Laterality: Left;   SHOULDER ARTHROSCOPY Bilateral    TUBAL LIGATION     Social History   Tobacco Use   Smoking status: Every Day    Current packs/day: 0.25    Average packs/day: 0.3 packs/day for 6.0 years (1.5 ttl pk-yrs)    Types: Cigarettes   Smokeless tobacco: Never   Tobacco comments:    4-5 cigs/day  Vaping Use   Vaping status: Never Used  Substance Use Topics   Alcohol use: Yes    Comment: occasionally   Drug use: No   Social History   Socioeconomic History   Marital status: Divorced    Spouse name: Not on file   Number of children: 3   Years of education: Not on file   Highest education level: Associate degree: occupational, scientist, product/process development, or vocational program  Occupational History   Occupation: Medical Laboratory Scientific Officer: [employer]  Tobacco Use   Smoking status: Every Day    Current packs/day: 0.25    Average packs/day: 0.3 packs/day for 6.0 years (1.5 ttl pk-yrs)    Types: Cigarettes   Smokeless tobacco: Never   Tobacco comments:    4-5 cigs/day  Vaping Use   Vaping status: Never Used  Substance and Sexual Activity   Alcohol use: Yes    Comment: occasionally   Drug use: No   Sexual activity: Not Currently    Birth control/protection: Surgical, Pill    Comment: tubal ligation  Other Topics Concern   Not on file  Social  History Narrative   Not on file   Social Drivers of Health   Financial Resource Strain: Low Risk  (12/22/2023)   Overall Financial Resource Strain (CARDIA)    Difficulty of Paying Living Expenses: Not hard at all  Recent Concern: Financial Resource Strain - High Risk (09/28/2023)   Overall Financial Resource Strain (CARDIA)    Difficulty of Paying Living Expenses: Very hard  Food Insecurity: No Food Insecurity (12/22/2023)   Hunger Vital Sign    Worried About Running Out of Food in the Last Year: Never true    Ran Out of Food in the Last Year: Never true  Transportation Needs: No Transportation Needs (12/22/2023)   PRAPARE - Administrator, Civil Service (Medical): No    Lack of Transportation (Non-Medical): No  Physical Activity: Insufficiently Active (12/22/2023)   Exercise Vital Sign    Days of Exercise per Week: 3 days    Minutes of Exercise per Session: 20 min  Stress: Stress Concern Present (12/22/2023)   [employer] of Occupational Health - Occupational Stress Questionnaire    Feeling of Stress : Very much  Social Connections: Unknown (12/22/2023)   Social Connection and Isolation Panel [NHANES]    Frequency of Communication with Friends and Family: More than three times a week    Frequency of Social Gatherings with Friends and Family: Once a week    Attends Religious Services: Patient declined    Database Administrator or Organizations: No    Attends Engineer, Structural: Not on file    Marital Status: Divorced  Recent Concern: Social Connections - Moderately Isolated (09/28/2023)   Social Connection and Isolation Panel [NHANES]    Frequency of Communication with Friends and Family: More than three times a week    Frequency of Social Gatherings with Friends and Family: Patient declined    Attends Religious Services: 1 to 4 times per year    Active Member of [employer] or Organizations: No    Attends Engineer, Structural: Not on file    Marital  Status: Divorced  Intimate Partner Violence: Not At Risk (08/02/2022)   [facility]    Threatened: Not on file    Insulted: Not on file    Physically Hurt : Not on file    Scream: Not on file   Family Status  Relation Name Status   Mother  Alive   Father  Deceased   Sister  Alive   Neg Hx  (Not Specified)  No partnership data on file   Family History  Problem Relation Age of Onset   Diabetes Mother    Hypertension Mother    Kidney disease Mother    CAD Father    Heart attack Father        died from MI at the age of 69   CAD Sister        stents placed at age 45   Breast cancer Neg Hx    Colon cancer Neg Hx    Colon polyps Neg Hx    Esophageal cancer Neg Hx    Stomach cancer Neg Hx    Rectal cancer Neg Hx    Allergies  Allergen Reactions   Influenza Vaccines Hives and Rash   Latex Itching and Rash   Vyvanse  [Lisdexamfetamine Dimesylate ]     Headache/mood changes.    Wellbutrin  [Bupropion ] Other (See Comments)    Possible nipple soreness/headaches/not feeling right   Hydrocodone  Nausea And Vomiting   Hydrocodone -Acetaminophen  Rash and Nausea Only      Review of Systems  All other systems reviewed and are negative.     Objective:     BP 112/74   Pulse 90   Ht 5' 2 (1.575 m)   Wt 184 lb (83.5 kg)   SpO2 99%   BMI 33.65 kg/m  BP Readings from Last 3 Encounters:  12/24/23 112/74  11/26/23 118/76  10/28/23 116/80   Wt Readings from Last 3 Encounters:  12/24/23 184 lb (83.5 kg)  11/26/23 189 lb 11.2 oz (86 kg)  10/28/23 188 lb 9.6 oz (85.5 kg)    ..    12/24/2023    2:35 PM 11/26/2023    9:59 AM 09/29/2023   10:05 AM 06/03/2023  3:13 PM 03/26/2023   11:58 AM  Depression screen PHQ 2/9  Decreased Interest 1 3 3 1  0  Down, Depressed, Hopeless 1 3 3  0 0  PHQ - 2 Score 2 6 6 1  0  Altered sleeping 3 3 3  1   Tired, decreased energy 1 3 3  2   Change in appetite 2 3 3  3   Feeling bad or failure about  yourself  1 3 0  3  Trouble concentrating 1 3 2  1   Moving slowly or fidgety/restless 0 1 1  1   Suicidal thoughts 0 1 0  0  PHQ-9 Score 10 23 18  11   Difficult doing work/chores Somewhat difficult Extremely dIfficult Extremely dIfficult  Somewhat difficult   ..    12/24/2023    2:35 PM 11/26/2023   10:00 AM 09/29/2023   10:05 AM 03/26/2023   11:59 AM  GAD 7 : Generalized Anxiety Score  Nervous, Anxious, on Edge 1 3 3  0  Control/stop worrying 3 3 3  0  Worry too much - different things 3 3 3  0  Trouble relaxing 3 3 3  0  Restless 1 3 2  0  Easily annoyed or irritable 3 3 3  0  Afraid - awful might happen 1 2 0 0  Total GAD 7 Score 15 20 17  0  Anxiety Difficulty Somewhat difficult Extremely difficult Extremely difficult Somewhat difficult      Physical Exam Constitutional:      Appearance: Normal appearance. She is obese.  Cardiovascular:     Rate and Rhythm: Normal rate and regular rhythm.  Pulmonary:     Effort: Pulmonary effort is normal.  Neurological:     General: No focal deficit present.     Mental Status: She is alert and oriented to person, place, and time.  Psychiatric:        Mood and Affect: Mood normal.        The 10-year ASCVD risk score (Arnett DK, et al., 2019) is: 1.5%    Assessment & Plan:  SABRASABRASophiarose was seen today for medical management of chronic issues.  Diagnoses and all orders for this visit:  GAD (generalized anxiety disorder) -     DULoxetine  (CYMBALTA ) 30 MG capsule; Take 1 capsule (30 mg total) by mouth 2 (two) times daily.  Lumbar facet arthropathy -     DULoxetine  (CYMBALTA ) 30 MG capsule; Take 1 capsule (30 mg total) by mouth 2 (two) times daily.  Panic disorder -     DULoxetine  (CYMBALTA ) 30 MG capsule; Take 1 capsule (30 mg total) by mouth 2 (two) times daily.  Panic attacks -     DULoxetine  (CYMBALTA ) 30 MG capsule; Take 1 capsule (30 mg total) by mouth 2 (two) times daily.  Chronic left-sided low back pain with left-sided  sciatica -     DULoxetine  (CYMBALTA ) 30 MG capsule; Take 1 capsule (30 mg total) by mouth 2 (two) times daily.   PHQ/GAD improving but not to goal Increased cymbalta  to 30mg  bid  Continue symptomatic care of back with good lifting and regular stretching and yoga Vitals look great  Return in about 3 months (around 03/23/2024).    Tambria Pfannenstiel, PA-C

## 2023-12-24 NOTE — Telephone Encounter (Addendum)
 Initiated Prior authorization for:Zepbound  2.5MG /0.5ML pen-injectors Via: Covermymeds Case/Key:[id number] Status: denied  as of 12/24/23 Reason:The requested medication and/or diagnosis are not a covered benefit and excluded from coverage in accordance with the terms and conditions of your plan benefit. Therefore, the request has been administratively denied. Notified Pt via: Mychart

## 2023-12-24 NOTE — Patient Instructions (Signed)
 Increase cymbalta to 30mg  twice a day

## 2023-12-25 ENCOUNTER — Other Ambulatory Visit (HOSPITAL_BASED_OUTPATIENT_CLINIC_OR_DEPARTMENT_OTHER): Payer: Self-pay

## 2023-12-29 ENCOUNTER — Other Ambulatory Visit (HOSPITAL_BASED_OUTPATIENT_CLINIC_OR_DEPARTMENT_OTHER): Payer: Self-pay

## 2023-12-29 ENCOUNTER — Other Ambulatory Visit: Payer: Self-pay

## 2023-12-29 MED ORDER — CYRED EQ 0.15-30 MG-MCG PO TABS
1.0000 | ORAL_TABLET | Freq: Every day | ORAL | 1 refills | Status: DC
  Filled 2023-12-29: qty 84, 84d supply, fill #0

## 2024-01-01 ENCOUNTER — Other Ambulatory Visit (HOSPITAL_BASED_OUTPATIENT_CLINIC_OR_DEPARTMENT_OTHER): Payer: Self-pay

## 2024-01-06 DIAGNOSIS — M5416 Radiculopathy, lumbar region: Secondary | ICD-10-CM | POA: Diagnosis not present

## 2024-01-06 DIAGNOSIS — Z6832 Body mass index (BMI) 32.0-32.9, adult: Secondary | ICD-10-CM | POA: Diagnosis not present

## 2024-01-06 DIAGNOSIS — M47816 Spondylosis without myelopathy or radiculopathy, lumbar region: Secondary | ICD-10-CM | POA: Diagnosis not present

## 2024-01-20 ENCOUNTER — Encounter (HOSPITAL_BASED_OUTPATIENT_CLINIC_OR_DEPARTMENT_OTHER): Payer: Self-pay

## 2024-01-20 ENCOUNTER — Inpatient Hospital Stay (HOSPITAL_BASED_OUTPATIENT_CLINIC_OR_DEPARTMENT_OTHER): Admission: RE | Admit: 2024-01-20 | Payer: 59 | Source: Ambulatory Visit | Admitting: Radiology

## 2024-01-20 DIAGNOSIS — Z1231 Encounter for screening mammogram for malignant neoplasm of breast: Secondary | ICD-10-CM

## 2024-01-27 ENCOUNTER — Other Ambulatory Visit (HOSPITAL_BASED_OUTPATIENT_CLINIC_OR_DEPARTMENT_OTHER): Payer: Self-pay

## 2024-01-27 ENCOUNTER — Ambulatory Visit: Payer: Medicaid Other | Admitting: Obstetrics and Gynecology

## 2024-01-27 ENCOUNTER — Encounter: Payer: Self-pay | Admitting: Obstetrics and Gynecology

## 2024-01-27 ENCOUNTER — Other Ambulatory Visit (HOSPITAL_COMMUNITY)
Admission: RE | Admit: 2024-01-27 | Discharge: 2024-01-27 | Disposition: A | Discharge status: Home / Self Care | Payer: Medicaid Other | Source: Ambulatory Visit | Attending: Obstetrics and Gynecology | Admitting: Obstetrics and Gynecology

## 2024-01-27 ENCOUNTER — Encounter (HOSPITAL_BASED_OUTPATIENT_CLINIC_OR_DEPARTMENT_OTHER): Payer: Self-pay

## 2024-01-27 VITALS — BP 143/84 | HR 91 | Resp 16 | Ht 62.0 in | Wt 187.0 lb

## 2024-01-27 DIAGNOSIS — I10 Essential (primary) hypertension: Secondary | ICD-10-CM

## 2024-01-27 DIAGNOSIS — Z01419 Encounter for gynecological examination (general) (routine) without abnormal findings: Secondary | ICD-10-CM | POA: Insufficient documentation

## 2024-01-27 DIAGNOSIS — Z113 Encounter for screening for infections with a predominantly sexual mode of transmission: Secondary | ICD-10-CM | POA: Diagnosis not present

## 2024-01-27 MED ORDER — HYDROCHLOROTHIAZIDE 25 MG PO TABS
25.0000 mg | ORAL_TABLET | Freq: Every day | ORAL | 1 refills | Status: DC
  Filled 2024-01-27: qty 90, 90d supply, fill #0

## 2024-01-27 MED ORDER — SLYND 4 MG PO TABS
1.0000 | ORAL_TABLET | Freq: Every day | ORAL | 11 refills | Status: DC
  Filled 2024-01-27: qty 28, 28d supply, fill #0

## 2024-01-27 NOTE — Progress Notes (Signed)
GYNECOLOGY ANNUAL PREVENTATIVE CARE ENCOUNTER NOTE  History:     Kelsey Vaughan is a 48 y.o. [id number] female here for a routine annual gynecologic exam.  Current complaints: None.   Denies abnormal vaginal bleeding, discharge, pelvic pain, problems with intercourse or other gynecologic concerns.  Concerns about HtN, elevated at office visits. Was also told her kidneys were off.   On Estrogen BC .  Working on health and weight loss.   Gynecologic History Patient's last menstrual period was 01/20/2024. Contraception: OCP (estrogen/progesterone), will change to Progestin only slynd due to HTN and increase risk of stroke.  Last Pap: 2024. Result was normal with negative HPV Last Mammogram: 2024.  Result was normal   Obstetric History OB History  Gravida Para Term Preterm AB Living  3 3 3   3   SAB IAB Ectopic Multiple Live Births          # Outcome Date GA Lbr Len/2nd Weight Sex Type Anes PTL Lv  3 Term           2 Term           1 Term             Past Medical History:  Diagnosis Date   Allergy    Anxiety    Arthritis    Atrial fibrillation (HCC)    a. occuring in 02/2016   B12 deficiency    Complication of anesthesia    Constipation    Foreign body in foot, left 07/2013   GERD (gastroesophageal reflux disease)    Hematuria    Irregular menses    due to oral contraceptive   Palpitations    a. 01/2017: Event monitor showing SR with PVC's.    PONV (postoperative nausea and vomiting)    Sinus tachycardia    Thyroid nodule    Vitamin D deficiency     Past Surgical History:  Procedure Laterality Date   CESAREAN SECTION     FOREIGN BODY REMOVAL Left 08/05/2013   Procedure: LEFT FOOT FOREIGN BODY REMOVAL ADULT;  Surgeon: Toni Arthurs, MD;  Location: [facility];  Service: Orthopedics;  Laterality: Left;   SHOULDER ARTHROSCOPY Bilateral    TUBAL LIGATION      Current Outpatient Medications on File Prior to Visit  Medication Sig Dispense Refill    aspirin EC 81 MG tablet Take 1 tablet (81 mg total) by mouth daily.     desogestrel-ethinyl estradiol (CYRED EQ) 0.15-30 MG-MCG tablet Take 1 tablet by mouth daily. 84 tablet 1   DULoxetine (CYMBALTA) 30 MG capsule Take 1 capsule (30 mg total) by mouth 2 (two) times daily. 180 capsule 0   levocetirizine (XYZAL) 5 MG tablet Take 1 tablet (5 mg total) by mouth every evening. 90 tablet 1   mometasone (ELOCON) 0.1 % cream Apply topically to affected area 2 (two) times daily for 2 weeks. 45 g 1   mometasone (NASONEX) 50 MCG/ACT nasal spray One spray in each nostril twice a day, use left hand for right nostril, and right hand for left nostril.  Please dispense one bottle. 1 g 6   nebivolol (BYSTOLIC) 5 MG tablet Take 1.5 tablets (7.5 mg total) by mouth 2 (two) times daily. 90 tablet 3   SUMAtriptan (IMITREX) 100 MG tablet TAKE 1 TABLET (100 MG TOTAL) BY MOUTH ONCE AS NEEDED FOR UP TO 1 DOSE FOR MIGRAINE. MAY REPEAT IN 2 HOURS IF HEADACHE PERSISTS OR RECURS. 10 tablet 5   Suvorexant (BELSOMRA)  10 MG TABS Take 1 tablet (10 mg total) by mouth at bedtime. 30 tablet 1   valACYclovir (VALTREX) 1000 MG tablet Take 1 tablet (1,000 mg total) by mouth 2 (two) times daily. 14 tablet 11   rosuvastatin (CRESTOR) 10 MG tablet Take 1 tablet (10 mg total) by mouth daily. (Patient not taking: Reported on 01/27/2024) 90 tablet 3   No current facility-administered medications on file prior to visit.    Allergies  Allergen Reactions   Influenza Vaccines Hives and Rash   Latex Itching and Rash   Vyvanse [Lisdexamfetamine Dimesylate]     Headache/mood changes.    Wellbutrin [Bupropion] Other (See Comments)    Possible nipple soreness/headaches/not feeling right   Hydrocodone Nausea And Vomiting   Hydrocodone-Acetaminophen Rash and Nausea Only    Social History:  reports that she has been smoking cigarettes. She has a 1.5 pack-year smoking history. She has never used smokeless tobacco. She reports current alcohol use.  She reports that she does not use drugs.  Family History  Problem Relation Age of Onset   Diabetes Mother    Hypertension Mother    Kidney disease Mother    CAD Father    Heart attack Father        died from MI at the age of 27   CAD Sister        stents placed at age 8   Breast cancer Neg Hx    Colon cancer Neg Hx    Colon polyps Neg Hx    Esophageal cancer Neg Hx    Stomach cancer Neg Hx    Rectal cancer Neg Hx     The following portions of the patient's history were reviewed and updated as appropriate: allergies, current medications, past family history, past medical history, past social history, past surgical history and problem list.  Review of Systems Pertinent items noted in HPI and remainder of comprehensive ROS otherwise negative.  Physical Exam:  BP (!) 143/84   Pulse 91   Resp 16   Ht 5\' 2"  (1.575 m)   Wt 187 lb (84.8 kg)   LMP 01/20/2024   BMI 34.20 kg/m  CONSTITUTIONAL: Well-developed, well-nourished female in no acute distress.  HENT:  Normocephalic, atraumatic, External right and left ear normal.  EYES: Conjunctivae and EOM are normal. Pupils are equal, round, and reactive to light. No scleral icterus.  NECK: Normal range of motion, supple, no masses.  Normal thyroid.  SKIN: Skin is warm and dry. No rash noted. Not diaphoretic. No erythema. No pallor. MUSCULOSKELETAL: Normal range of motion. No tenderness.  No cyanosis, clubbing, or edema. NEUROLOGIC: Alert and oriented to person, place, and time. Normal reflexes, muscle tone coordination.  PSYCHIATRIC: Normal mood and affect. Normal behavior. Normal judgment and thought content. CARDIOVASCULAR: Normal heart rate noted, regular rhythm RESPIRATORY: Clear to auscultation bilaterally. Effort and breath sounds normal, no problems with respiration noted. BREASTS: Symmetric in size. No masses, tenderness, skin changes, nipple drainage, or lymphadenopathy bilaterally. Performed in the presence of a  chaperone. ABDOMEN: Soft, no distention noted.  No tenderness, rebound or guarding.  PELVIC: Normal appearing external genitalia and urethral meatus; normal appearing vaginal mucosa and cervix.  No abnormal vaginal discharge noted.  Pap smear obtained.  Normal uterine size, no other palpable masses, no uterine or adnexal tenderness.  Performed in the presence of a chaperone.   Assessment and Plan:  1. Well woman exam with routine gynecological exam (Primary)  - Cytology - PAP( [facility]) -  She requests yearly paps  - Stop OCPs, initiate slynd, RX sent along with samples.   2. Screening examination for STD (sexually transmitted disease)  - RPR - HIV antibody (with reflex) - Hepatitis C Antibody - Hepatitis B Surface AntiGEN  3. Essential hypertension  - Rx hydrochlorothiazide, she may need kidney protection - close follow up with PCP  - Start checking your BP at home and keep a log for review.     Will follow up results of pap smear and manage accordingly. Normal breast examination today, she was advised to perform periodic self breast examinations.  Mammogram scheduled for breast cancer screening. Routine preventative health maintenance measures emphasized. Please refer to After Visit Summary for other counseling recommendations.    Feliz Lincoln, Harolyn Rutherford, NP [facility] [employer], [facility]

## 2024-01-28 ENCOUNTER — Encounter (HOSPITAL_BASED_OUTPATIENT_CLINIC_OR_DEPARTMENT_OTHER): Payer: Self-pay

## 2024-01-28 ENCOUNTER — Other Ambulatory Visit: Payer: Self-pay | Admitting: Physician Assistant

## 2024-01-28 ENCOUNTER — Other Ambulatory Visit (HOSPITAL_BASED_OUTPATIENT_CLINIC_OR_DEPARTMENT_OTHER): Payer: Self-pay

## 2024-01-28 DIAGNOSIS — E781 Pure hyperglyceridemia: Secondary | ICD-10-CM

## 2024-01-28 DIAGNOSIS — M47816 Spondylosis without myelopathy or radiculopathy, lumbar region: Secondary | ICD-10-CM

## 2024-01-28 DIAGNOSIS — E66811 Obesity, class 1: Secondary | ICD-10-CM | POA: Insufficient documentation

## 2024-01-28 DIAGNOSIS — R7303 Prediabetes: Secondary | ICD-10-CM

## 2024-01-28 MED ORDER — MOUNJARO 2.5 MG/0.5ML ~~LOC~~ SOAJ
2.5000 mg | SUBCUTANEOUS | 0 refills | Status: DC
  Filled 2024-01-28: qty 2, 28d supply, fill #0

## 2024-01-28 NOTE — Progress Notes (Signed)
Pt has pre-diabetes, chronic pain, HTN, hypertriglyceremia. Pt would benefit from weight loss.  Start mounjaro 2.5mg  weekly. Follow up in 1 month.

## 2024-01-31 LAB — CYTOLOGY - PAP
Adequacy: ABSENT
Chlamydia: NEGATIVE
Comment: NEGATIVE
Comment: NEGATIVE
Comment: NORMAL
Diagnosis: NEGATIVE
High risk HPV: NEGATIVE
Neisseria Gonorrhea: NEGATIVE

## 2024-02-03 ENCOUNTER — Ambulatory Visit: Payer: 59 | Admitting: Sports Medicine

## 2024-02-09 ENCOUNTER — Encounter (HOSPITAL_BASED_OUTPATIENT_CLINIC_OR_DEPARTMENT_OTHER): Payer: Self-pay | Admitting: Radiology

## 2024-02-09 ENCOUNTER — Ambulatory Visit (HOSPITAL_BASED_OUTPATIENT_CLINIC_OR_DEPARTMENT_OTHER)
Admission: RE | Admit: 2024-02-09 | Discharge: 2024-02-09 | Disposition: A | Discharge status: Home / Self Care | Payer: Medicaid Other | Source: Ambulatory Visit

## 2024-02-09 ENCOUNTER — Other Ambulatory Visit (HOSPITAL_BASED_OUTPATIENT_CLINIC_OR_DEPARTMENT_OTHER): Payer: Self-pay

## 2024-02-09 DIAGNOSIS — Z1231 Encounter for screening mammogram for malignant neoplasm of breast: Secondary | ICD-10-CM | POA: Diagnosis not present

## 2024-02-11 ENCOUNTER — Encounter: Payer: Self-pay | Admitting: Physician Assistant

## 2024-02-11 NOTE — Progress Notes (Signed)
 Normal mammogram. Follow up in 1 year.

## 2024-02-12 ENCOUNTER — Encounter: Payer: Self-pay | Admitting: Professional

## 2024-02-12 ENCOUNTER — Ambulatory Visit: Payer: Medicaid Other | Admitting: Professional

## 2024-02-12 DIAGNOSIS — F411 Generalized anxiety disorder: Secondary | ICD-10-CM | POA: Diagnosis not present

## 2024-02-12 DIAGNOSIS — F330 Major depressive disorder, recurrent, mild: Secondary | ICD-10-CM

## 2024-02-12 DIAGNOSIS — F4310 Post-traumatic stress disorder, unspecified: Secondary | ICD-10-CM | POA: Diagnosis not present
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
## 2024-02-12 NOTE — Progress Notes (Signed)
   Kelsey Vaughan, [facility]

## 2024-02-17 ENCOUNTER — Ambulatory Visit: Payer: Self-pay | Admitting: Professional

## 2024-02-17 ENCOUNTER — Encounter: Payer: Self-pay | Admitting: Professional

## 2024-02-17 DIAGNOSIS — F330 Major depressive disorder, recurrent, mild: Secondary | ICD-10-CM

## 2024-02-17 DIAGNOSIS — F411 Generalized anxiety disorder: Secondary | ICD-10-CM | POA: Diagnosis not present

## 2024-02-17 DIAGNOSIS — F4310 Post-traumatic stress disorder, unspecified: Secondary | ICD-10-CM
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
## 2024-02-17 NOTE — Progress Notes (Signed)
   Kelsey Vaughan, [facility]

## 2024-02-24 ENCOUNTER — Ambulatory Visit: Payer: Self-pay | Admitting: Professional

## 2024-02-24 ENCOUNTER — Encounter: Payer: Self-pay | Admitting: Professional

## 2024-02-24 ENCOUNTER — Encounter: Payer: Self-pay | Admitting: Physician Assistant

## 2024-02-24 DIAGNOSIS — F4310 Post-traumatic stress disorder, unspecified: Secondary | ICD-10-CM | POA: Diagnosis not present

## 2024-02-24 DIAGNOSIS — F330 Major depressive disorder, recurrent, mild: Secondary | ICD-10-CM

## 2024-02-24 DIAGNOSIS — F411 Generalized anxiety disorder: Secondary | ICD-10-CM
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
## 2024-03-08 ENCOUNTER — Ambulatory Visit: Payer: 59 | Attending: Cardiovascular Disease | Admitting: Cardiovascular Disease

## 2024-03-09 ENCOUNTER — Ambulatory Visit (INDEPENDENT_AMBULATORY_CARE_PROVIDER_SITE_OTHER): Payer: 59 | Admitting: Physician Assistant

## 2024-03-09 ENCOUNTER — Other Ambulatory Visit (HOSPITAL_BASED_OUTPATIENT_CLINIC_OR_DEPARTMENT_OTHER): Payer: Self-pay

## 2024-03-09 ENCOUNTER — Encounter: Payer: Self-pay | Admitting: Physician Assistant

## 2024-03-09 VITALS — BP 137/82 | HR 76

## 2024-03-09 DIAGNOSIS — E7800 Pure hypercholesterolemia, unspecified: Secondary | ICD-10-CM

## 2024-03-09 DIAGNOSIS — M47816 Spondylosis without myelopathy or radiculopathy, lumbar region: Secondary | ICD-10-CM

## 2024-03-09 DIAGNOSIS — Z1159 Encounter for screening for other viral diseases: Secondary | ICD-10-CM

## 2024-03-09 DIAGNOSIS — R7303 Prediabetes: Secondary | ICD-10-CM | POA: Diagnosis not present

## 2024-03-09 DIAGNOSIS — M5442 Lumbago with sciatica, left side: Secondary | ICD-10-CM

## 2024-03-09 DIAGNOSIS — G8929 Other chronic pain: Secondary | ICD-10-CM | POA: Diagnosis not present

## 2024-03-09 DIAGNOSIS — F411 Generalized anxiety disorder: Secondary | ICD-10-CM | POA: Diagnosis not present

## 2024-03-09 DIAGNOSIS — F410 Panic disorder [episodic paroxysmal anxiety] without agoraphobia: Secondary | ICD-10-CM | POA: Diagnosis not present

## 2024-03-09 DIAGNOSIS — Z0000 Encounter for general adult medical examination without abnormal findings: Secondary | ICD-10-CM | POA: Diagnosis not present

## 2024-03-09 MED ORDER — DULOXETINE HCL 30 MG PO CPEP
30.0000 mg | ORAL_CAPSULE | Freq: Two times a day (BID) | ORAL | 1 refills | Status: DC
  Filled 2024-03-09 (×2): qty 180, 90d supply, fill #0

## 2024-03-09 MED ORDER — SLYND 4 MG PO TABS: 1.0000 | ORAL_TABLET | Freq: Every day | ORAL | Status: DC

## 2024-03-09 NOTE — Patient Instructions (Addendum)
 Trial of trazodone that you have at home for sleep 25-50mg  1 hour before bed.  Consider magnesium 400mg  at bedtime.  Take cymbalta daily!!! Make sure BP under 130/80.   Health Maintenance, Female Adopting a healthy lifestyle and getting preventive care are important in promoting health and wellness. Ask your health care provider about: The right schedule for you to have regular tests and exams. Things you can do on your own to prevent diseases and keep yourself healthy. What should I know about diet, weight, and exercise? Eat a healthy diet  Eat a diet that includes plenty of vegetables, fruits, low-fat dairy products, and lean protein. Do not eat a lot of foods that are high in solid fats, added sugars, or sodium. Maintain a healthy weight Body mass index (BMI) is used to identify weight problems. It estimates body fat based on height and weight. Your health care provider can help determine your BMI and help you achieve or maintain a healthy weight. Get regular exercise Get regular exercise. This is one of the most important things you can do for your health. Most adults should: Exercise for at least 150 minutes each week. The exercise should increase your heart rate and make you sweat (moderate-intensity exercise). Do strengthening exercises at least twice a week. This is in addition to the moderate-intensity exercise. Spend less time sitting. Even light physical activity can be beneficial. Watch cholesterol and blood lipids Have your blood tested for lipids and cholesterol at 48 years of age, then have this test every 5 years. Have your cholesterol levels checked more often if: Your lipid or cholesterol levels are high. You are older than 48 years of age. You are at high risk for heart disease. What should I know about cancer screening? Depending on your health history and family history, you may need to have cancer screening at various ages. This may include screening for: Breast  cancer. Cervical cancer. Colorectal cancer. Skin cancer. Lung cancer. What should I know about heart disease, diabetes, and high blood pressure? Blood pressure and heart disease High blood pressure causes heart disease and increases the risk of stroke. This is more likely to develop in people who have high blood pressure readings or are overweight. Have your blood pressure checked: Every 3-5 years if you are 42-20 years of age. Every year if you are 64 years old or older. Diabetes Have regular diabetes screenings. This checks your fasting blood sugar level. Have the screening done: Once every three years after age 43 if you are at a normal weight and have a low risk for diabetes. More often and at a younger age if you are overweight or have a high risk for diabetes. What should I know about preventing infection? Hepatitis B If you have a higher risk for hepatitis B, you should be screened for this virus. Talk with your health care provider to find out if you are at risk for hepatitis B infection. Hepatitis C Testing is recommended for: Everyone born from [birth year] through 1965. Anyone with known risk factors for hepatitis C. Sexually transmitted infections (STIs) Get screened for STIs, including gonorrhea and chlamydia, if: You are sexually active and are younger than 48 years of age. You are older than 48 years of age and your health care provider tells you that you are at risk for this type of infection. Your sexual activity has changed since you were last screened, and you are at increased risk for chlamydia or gonorrhea. Ask your health care provider  if you are at risk. Ask your health care provider about whether you are at high risk for HIV. Your health care provider may recommend a prescription medicine to help prevent HIV infection. If you choose to take medicine to prevent HIV, you should first get tested for HIV. You should then be tested every 3 months for as long as you are taking  the medicine. Pregnancy If you are about to stop having your period (premenopausal) and you may become pregnant, seek counseling before you get pregnant. Take 400 to 800 micrograms (mcg) of folic acid every day if you become pregnant. Ask for birth control (contraception) if you want to prevent pregnancy. Osteoporosis and menopause Osteoporosis is a disease in which the bones lose minerals and strength with aging. This can result in bone fractures. If you are 13 years old or older, or if you are at risk for osteoporosis and fractures, ask your health care provider if you should: Be screened for bone loss. Take a calcium or vitamin D supplement to lower your risk of fractures. Be given hormone replacement therapy (HRT) to treat symptoms of menopause. Follow these instructions at home: Alcohol use Do not drink alcohol if: Your health care provider tells you not to drink. You are pregnant, may be pregnant, or are planning to become pregnant. If you drink alcohol: Limit how much you have to: 0-1 drink a day. Know how much alcohol is in your drink. In the [place] one drink equals one 12 oz bottle of beer (355 mL), one 5 oz glass of wine (148 mL), or one 1 oz glass of hard liquor (44 mL). Lifestyle Do not use any products that contain nicotine or tobacco. These products include cigarettes, chewing tobacco, and vaping devices, such as e-cigarettes. If you need help quitting, ask your health care provider. Do not use street drugs. Do not share needles. Ask your health care provider for help if you need support or information about quitting drugs. General instructions Schedule regular health, dental, and eye exams. Stay current with your vaccines. Tell your health care provider if: You often feel depressed. You have ever been abused or do not feel safe at home. Summary Adopting a healthy lifestyle and getting preventive care are important in promoting health and wellness. Follow your health  care provider's instructions about healthy diet, exercising, and getting tested or screened for diseases. Follow your health care provider's instructions on monitoring your cholesterol and blood pressure. This information is not intended to replace advice given to you by your health care provider. Make sure you discuss any questions you have with your health care provider. Document Revised: 04/23/2021 Document Reviewed: 04/23/2021 [employer] Patient Education  2024 [employer].

## 2024-03-12 ENCOUNTER — Encounter: Payer: Self-pay | Admitting: Professional

## 2024-03-12 ENCOUNTER — Ambulatory Visit (INDEPENDENT_AMBULATORY_CARE_PROVIDER_SITE_OTHER): Payer: Self-pay | Admitting: Professional

## 2024-03-12 DIAGNOSIS — F330 Major depressive disorder, recurrent, mild: Secondary | ICD-10-CM | POA: Diagnosis not present

## 2024-03-12 DIAGNOSIS — F411 Generalized anxiety disorder: Secondary | ICD-10-CM

## 2024-03-12 DIAGNOSIS — F4310 Post-traumatic stress disorder, unspecified: Secondary | ICD-10-CM | POA: Diagnosis not present
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
## 2024-03-15 ENCOUNTER — Encounter: Payer: Self-pay | Admitting: Physician Assistant

## 2024-03-15 DIAGNOSIS — Z0000 Encounter for general adult medical examination without abnormal findings: Secondary | ICD-10-CM | POA: Diagnosis not present

## 2024-03-15 DIAGNOSIS — R7303 Prediabetes: Secondary | ICD-10-CM | POA: Diagnosis not present

## 2024-03-15 DIAGNOSIS — Z1159 Encounter for screening for other viral diseases: Secondary | ICD-10-CM | POA: Diagnosis not present

## 2024-03-15 DIAGNOSIS — Z113 Encounter for screening for infections with a predominantly sexual mode of transmission: Secondary | ICD-10-CM | POA: Diagnosis not present

## 2024-03-15 DIAGNOSIS — E7800 Pure hypercholesterolemia, unspecified: Secondary | ICD-10-CM | POA: Diagnosis not present

## 2024-03-15 DIAGNOSIS — F411 Generalized anxiety disorder: Secondary | ICD-10-CM | POA: Diagnosis not present

## 2024-03-15 NOTE — Progress Notes (Signed)
 Complete physical exam  Patient: Kelsey Vaughan   DOB: 1976-07-17   48 y.o. Female  MRN: [id number]  Subjective:    Chief Complaint  Patient presents with   Annual Exam    Non fasting labs phq gad    Kelsey Vaughan is a 48 y.o. female who presents today for a complete physical exam. She reports consuming a general diet. The patient does not participate in regular exercise at present. She generally feels well. She reports sleeping well. She does not have additional problems to discuss today.    Most recent fall risk assessment:    03/15/2024    4:34 PM  Fall Risk   Falls in the past year? 0  Number falls in past yr: 0  Injury with Fall? 0  Risk for fall due to : No Fall Risks  Follow up Falls evaluation completed     Most recent depression screenings:    03/09/2024   10:45 AM 02/17/2024    9:01 AM  PHQ 2/9 Scores  PHQ - 2 Score 0   PHQ- 9 Score 0      Information is confidential and restricted. Go to Review Flowsheets to unlock data.    Vision:Within last year and Dental: No current dental problems and Receives regular dental care  Patient Active Problem List   Diagnosis Date Noted   Obesity (BMI 30.0-34.9) 01/28/2024   Multiple thyroid nodules 10/06/2023   Thyroid enlarged 10/06/2023   Primary insomnia 09/29/2023   SOB (shortness of breath) 08/15/2023   Non-recurrent acute serous otitis media of left ear 06/03/2023   Panic attacks 01/28/2023   Class 1 obesity due to excess calories without serious comorbidity with body mass index (BMI) of 33.0 to 33.9 in adult 01/28/2023   Pre-diabetes 01/27/2023   Panic disorder 07/22/2022   Blurry vision, bilateral 07/22/2022   Vision changes 01/07/2022   Near syncope 01/07/2022   Medication management 10/19/2021   Frontal lobe and executive function deficit 08/06/2021   Difficulty coping 08/06/2021   Bipolar 1 disorder, mixed, severe (HCC) 07/09/2021   Mood disorder (HCC) 07/09/2021   Severe episode of recurrent  major depressive disorder, without psychotic features (HCC) 07/09/2021   Mass of left side of neck 06/12/2021   Abnormal urine color 06/12/2021   Left elbow pain 10/04/2020   Numbness and tingling of right arm 06/28/2020   Metatarsalgia of right foot 01/10/2020   Choking 11/17/2019   Dysphagia 11/17/2019   Patellofemoral pain syndrome of left knee 10/27/2019   Current smoker 10/22/2019   Anxiety 10/22/2019   Supraspinatus tendon tear 09/13/2019   Chronic left-sided low back pain with left-sided sciatica 06/02/2019   Impulsiveness 02/15/2019   Inattention 02/15/2019   Dry eye of right side 01/18/2019   Pterygium of right eye 01/18/2019   Cramping of feet 01/07/2019   Paresthesia 01/07/2019   Lumbar facet arthropathy 12/17/2018   Acute stress disorder 09/03/2018   Trouble in sleeping 09/03/2018   Fatigue 09/03/2018   Chronic glomerulonephritis 08/11/2018   Elevated fasting glucose 05/25/2018   Hypertriglyceridemia 05/25/2018   Binge-eating disorder, mild 05/20/2018   Irritability and anger 02/23/2018   Mood changes 12/24/2017   Irritable 12/24/2017   Vaginal discharge 11/04/2017   Vaginal irritation 11/04/2017   Lower abdominal pain 11/04/2017   Severe anxiety 10/14/2017   Stress reaction 10/12/2017   Frontal headache 09/16/2017   MDD (major depressive disorder), recurrent episode, mild (HCC) 09/03/2017   Insulin resistance 07/30/2017   Epigastric abdominal pain 04/17/2017  Constipation 04/17/2017   Hematuria 04/17/2017   Chronic sinusitis 04/07/2017   Leiomyoma of uterus 02/26/2017   Renal cyst, right 02/26/2017   Sinus tachycardia 02/06/2017   Palpitations 01/29/2017   PAC (premature atrial contraction) 01/29/2017   Vitamin D deficiency 12/23/2016   B12 deficiency 12/23/2016   Dysmenorrhea 12/19/2016   Bilateral cold feet 12/19/2016   New daily persistent headache 12/19/2016   GAD (generalized anxiety disorder) 12/19/2016   Tobacco dependence 12/19/2016    Tachycardia, paroxysmal (HCC) 03/07/2016   Microscopic hematuria 02/29/2016   Allergy to influenza vaccine 10/02/2015   Class 1 obesity due to excess calories without serious comorbidity with body mass index (BMI) of 30.0 to 30.9 in adult 11/25/2014   Neck strain 11/04/2014   Sinus tarsi syndrome of left ankle 07/16/2013   Plantar fascial fibromatosis of left foot 07/15/2013   Past Medical History:  Diagnosis Date   Allergy    Anxiety    Arthritis    Atrial fibrillation (HCC)    a. occuring in 02/2016   B12 deficiency    Complication of anesthesia    Constipation    Foreign body in foot, left 07/2013   GERD (gastroesophageal reflux disease)    Hematuria    Irregular menses    due to oral contraceptive   Palpitations    a. 01/2017: Event monitor showing SR with PVC's.    PONV (postoperative nausea and vomiting)    Sinus tachycardia    Thyroid nodule    Vitamin D deficiency    Past Surgical History:  Procedure Laterality Date   CESAREAN SECTION     FOREIGN BODY REMOVAL Left 08/05/2013   Procedure: LEFT FOOT FOREIGN BODY REMOVAL ADULT;  Surgeon: Toni Arthurs, MD;  Location: [facility];  Service: Orthopedics;  Laterality: Left;   SHOULDER ARTHROSCOPY Bilateral    TUBAL LIGATION     Family History  Problem Relation Age of Onset   Diabetes Mother    Hypertension Mother    Kidney disease Mother    CAD Father    Heart attack Father        died from MI at the age of 36   CAD Sister        stents placed at age 6   Breast cancer Neg Hx    Colon cancer Neg Hx    Colon polyps Neg Hx    Esophageal cancer Neg Hx    Stomach cancer Neg Hx    Rectal cancer Neg Hx    Allergies  Allergen Reactions   Influenza Vaccines Hives and Rash   Latex Itching and Rash   Vyvanse [Lisdexamfetamine Dimesylate]     Headache/mood changes.    Wellbutrin [Bupropion] Other (See Comments)    Possible nipple soreness/headaches/not feeling right   Hydrocodone Nausea And Vomiting    Hydrocodone-Acetaminophen Rash and Nausea Only      Patient Care Team: Nolene Ebbs as PCP - General (Family Medicine) Lewayne Bunting, MD as PCP - Cardiology (Cardiology)   Outpatient Medications Prior to Visit  Medication Sig Note   aspirin EC 81 MG tablet Take 1 tablet (81 mg total) by mouth daily.    levocetirizine (XYZAL) 5 MG tablet Take 1 tablet (5 mg total) by mouth every evening.    mometasone (NASONEX) 50 MCG/ACT nasal spray One spray in each nostril twice a day, use left hand for right nostril, and right hand for left nostril.  Please dispense one bottle.    nebivolol (  BYSTOLIC) 5 MG tablet Take 1.5 tablets (7.5 mg total) by mouth 2 (two) times daily.    SUMAtriptan (IMITREX) 100 MG tablet TAKE 1 TABLET (100 MG TOTAL) BY MOUTH ONCE AS NEEDED FOR UP TO 1 DOSE FOR MIGRAINE. MAY REPEAT IN 2 HOURS IF HEADACHE PERSISTS OR RECURS.    valACYclovir (VALTREX) 1000 MG tablet Take 1 tablet (1,000 mg total) by mouth 2 (two) times daily.    [DISCONTINUED] DULoxetine (CYMBALTA) 30 MG capsule Take 1 capsule (30 mg total) by mouth 2 (two) times daily.    [DISCONTINUED] Suvorexant (BELSOMRA) 10 MG TABS Take 1 tablet (10 mg total) by mouth at bedtime.    rosuvastatin (CRESTOR) 10 MG tablet Take 1 tablet (10 mg total) by mouth daily. (Patient not taking: Reported on 03/09/2024)    [DISCONTINUED] desogestrel-ethinyl estradiol (CYRED EQ) 0.15-30 MG-MCG tablet Take 1 tablet by mouth daily.    [DISCONTINUED] Drospirenone (SLYND) 4 MG TABS Take 1 tablet (4 mg total) by mouth daily at 6 (six) AM.    [DISCONTINUED] hydrochlorothiazide (HYDRODIURIL) 25 MG tablet Take 1 tablet (25 mg total) by mouth daily. (Patient not taking: Reported on 03/09/2024) 03/09/2024: Has not started  therapy   [DISCONTINUED] mometasone (ELOCON) 0.1 % cream Apply topically to affected area 2 (two) times daily for 2 weeks. (Patient not taking: Reported on 03/09/2024)    [DISCONTINUED] tirzepatide [facility]) 2.5 MG/0.5ML  Pen Inject 2.5 mg into the skin once a week. (Patient not taking: Reported on 03/09/2024) 03/09/2024: Waiting on insurance   No facility-administered medications prior to visit.    Review of Systems  All other systems reviewed and are negative.         Objective:     BP 137/82   Pulse 76  BP Readings from Last 3 Encounters:  03/15/24 137/82  01/27/24 (!) 143/84  12/24/23 112/74   Wt Readings from Last 3 Encounters:  01/27/24 187 lb (84.8 kg)  12/24/23 184 lb (83.5 kg)  11/26/23 189 lb 11.2 oz (86 kg)      Physical Exam   BP 137/82   Pulse 76   General Appearance:    Alert, cooperative, no distress, appears stated age  Head:    Normocephalic, without obvious abnormality, atraumatic  Eyes:    PERRL, conjunctiva/corneas clear, EOM's intact, fundi    benign, both eyes  Ears:    Normal TM's and external ear canals, both ears  Nose:   Nares normal, septum midline, mucosa normal, no drainage    or sinus tenderness  Throat:   Lips, mucosa, and tongue normal; teeth and gums normal  Neck:   Supple, symmetrical, trachea midline, no adenopathy;    thyroid:  no enlargement/tenderness/nodules; no carotid   bruit or JVD  Back:     Symmetric, no curvature, ROM normal, no CVA tenderness  Lungs:     Clear to auscultation bilaterally, respirations unlabored  Chest Wall:    No tenderness or deformity   Heart:    Regular rate and rhythm, S1 and S2 normal, no murmur, rub   or gallop     Abdomen:     Soft, non-tender, bowel sounds active all four quadrants,    no masses, no organomegaly        Extremities:   Extremities normal, atraumatic, no cyanosis or edema  Pulses:   2+ and symmetric all extremities  Skin:   Skin color, texture, turgor normal, no rashes or lesions  Lymph nodes:   Cervical, supraclavicular, and axillary nodes normal  Neurologic:   CNII-XII intact, normal strength, sensation and reflexes    throughout      Assessment & Plan:    Routine Health Maintenance and  Physical Exam  Immunization History  Administered Date(s) Administered   Tdap 01/27/2023    Health Maintenance  Topic Date Due   COVID-19 Vaccine (1) 03/25/2024 (Originally 08/18/1981)   MAMMOGRAM  02/08/2025   Cervical Cancer Screening (HPV/Pap Cotest)  01/26/2029   Colonoscopy  10/24/2031   DTaP/Tdap/Td (2 - Td or Tdap) 01/27/2033   Hepatitis C Screening  Completed   HIV Screening  Completed   HPV VACCINES  Aged Out   Pneumococcal Vaccine 13-46 Years old  Discontinued   INFLUENZA VACCINE  Discontinued    Discussed health benefits of physical activity, and encouraged her to engage in regular exercise appropriate for her age and condition.  [name] KitchenBjorn Loser was seen today for annual exam.  Diagnoses and all orders for this visit:  Routine physical examination -     CBC w/Diff/Platelet -     CMP14+EGFR -     Lipid panel -     TSH -     Hepatitis C Antibody -     Hemoglobin A1c -     Vitamin D (25 hydroxy) -     Lipoprotein A (LPA)  GAD (generalized anxiety disorder) -     CMP14+EGFR -     DULoxetine (CYMBALTA) 30 MG capsule; Take 1 capsule (30 mg total) by mouth 2 (two) times daily.  Lumbar facet arthropathy -     DULoxetine (CYMBALTA) 30 MG capsule; Take 1 capsule (30 mg total) by mouth 2 (two) times daily.  Panic disorder -     DULoxetine (CYMBALTA) 30 MG capsule; Take 1 capsule (30 mg total) by mouth 2 (two) times daily.  Panic attacks -     DULoxetine (CYMBALTA) 30 MG capsule; Take 1 capsule (30 mg total) by mouth 2 (two) times daily.  Chronic left-sided low back pain with left-sided sciatica -     DULoxetine (CYMBALTA) 30 MG capsule; Take 1 capsule (30 mg total) by mouth 2 (two) times daily.  Elevated LDL cholesterol level -     Lipid panel -     Lipoprotein A (LPA)  Pre-diabetes -     CMP14+EGFR -     Hemoglobin A1c -     Vitamin D (25 hydroxy)  Encounter for hepatitis C screening test for low risk patient -     Hepatitis C Antibody  Other orders -      Drospirenone (SLYND) 4 MG TABS; Take 1 tablet (4 mg total) by mouth daily at 6 (six) AM.   .. Discussed 150 minutes of exercise a week.  Encouraged vitamin D 1000 units and Calcium 1300mg  or 4 servings of dairy a day.  Fasting labs ordered Encouraged to stop smoking Start taking cymbalta daily for pain and mood Start taking magnesium at bedtime and trial of trazodone for sleep Pap and mammogram UTD Colonoscopy UTD Declined vaccines      Tandy Gaw, PA-C

## 2024-03-16 ENCOUNTER — Encounter: Payer: Self-pay | Admitting: Physician Assistant

## 2024-03-16 LAB — CMP14+EGFR
ALT: 17 IU/L (ref 0–32)
AST: 13 IU/L (ref 0–40)
Albumin: 3.9 g/dL (ref 3.9–4.9)
Alkaline Phosphatase: 93 IU/L (ref 44–121)
BUN/Creatinine Ratio: 9 (ref 9–23)
BUN: 9 mg/dL (ref 6–24)
Bilirubin Total: 0.2 mg/dL (ref 0.0–1.2)
CO2: 22 mmol/L (ref 20–29)
Calcium: 9.2 mg/dL (ref 8.7–10.2)
Chloride: 106 mmol/L (ref 96–106)
Creatinine, Ser: 0.95 mg/dL (ref 0.57–1.00)
Globulin, Total: 2.7 g/dL (ref 1.5–4.5)
Glucose: 92 mg/dL (ref 70–99)
Potassium: 3.9 mmol/L (ref 3.5–5.2)
Sodium: 140 mmol/L (ref 134–144)
Total Protein: 6.6 g/dL (ref 6.0–8.5)
eGFR: 74 mL/min/{1.73_m2} (ref >59)

## 2024-03-16 LAB — CBC WITH DIFFERENTIAL/PLATELET
Basophils Absolute: 0 10*3/uL (ref 0.0–0.2)
Basos: 0 %
EOS (ABSOLUTE): 0.1 10*3/uL (ref 0.0–0.4)
Eos: 2 %
Hematocrit: 37.4 % (ref 34.0–46.6)
Hemoglobin: 12.4 g/dL (ref 11.1–15.9)
Immature Grans (Abs): 0 10*3/uL (ref 0.0–0.1)
Immature Granulocytes: 0 %
Lymphocytes Absolute: 3.2 10*3/uL — ABNORMAL HIGH (ref 0.7–3.1)
Lymphs: 36 %
MCH: 28.4 pg (ref 26.6–33.0)
MCHC: 33.2 g/dL (ref 31.5–35.7)
MCV: 86 fL (ref 79–97)
Monocytes Absolute: 0.9 10*3/uL (ref 0.1–0.9)
Monocytes: 10 %
Neutrophils Absolute: 4.7 10*3/uL (ref 1.4–7.0)
Neutrophils: 52 %
Platelets: 292 10*3/uL (ref 150–450)
RBC: 4.36 x10E6/uL (ref 3.77–5.28)
RDW: 14 % (ref 11.7–15.4)
WBC: 8.9 10*3/uL (ref 3.4–10.8)

## 2024-03-16 LAB — LIPID PANEL
Chol/HDL Ratio: 3.9 ratio (ref 0.0–4.4)
Cholesterol, Total: 194 mg/dL (ref 100–199)
HDL: 50 mg/dL (ref >39)
LDL Chol Calc (NIH): 118 mg/dL — ABNORMAL HIGH (ref 0–99)
Triglycerides: 149 mg/dL (ref 0–149)
VLDL Cholesterol Cal: 26 mg/dL (ref 5–40)

## 2024-03-16 LAB — HEMOGLOBIN A1C
Est. average glucose Bld gHb Est-mCnc: 117 mg/dL
Hgb A1c MFr Bld: 5.7 % — ABNORMAL HIGH (ref 4.8–5.6)

## 2024-03-16 LAB — RPR: RPR Ser Ql: NONREACTIVE

## 2024-03-16 LAB — HEPATITIS B SURFACE ANTIGEN: Hepatitis B Surface Ag: NEGATIVE

## 2024-03-16 LAB — LIPOPROTEIN A (LPA): Lipoprotein (a): 52.8 nmol/L (ref <75.0)

## 2024-03-16 LAB — HEPATITIS C ANTIBODY: Hep C Virus Ab: NONREACTIVE

## 2024-03-16 LAB — TSH: TSH: 1.43 u[IU]/mL (ref 0.450–4.500)

## 2024-03-16 LAB — VITAMIN D 25 HYDROXY (VIT D DEFICIENCY, FRACTURES): Vit D, 25-Hydroxy: 26.4 ng/mL — ABNORMAL LOW (ref 30.0–100.0)

## 2024-03-16 LAB — HIV ANTIBODY (ROUTINE TESTING W REFLEX): HIV Screen 4th Generation wRfx: NONREACTIVE

## 2024-03-16 NOTE — Progress Notes (Signed)
 Kelsey Vaughan,   A1C improved from last month but still in pre-diabetes range.   Vitamin D still low. Add 2000 units and take with dairy for better absorption.   Thyroid, kidney, liver, look great.   Low levels of lipoprotein a. Good news.   LDL not to goal. I would really encourage you to take the crestor! Your 10 year CV risk is above 7.5 percent.   [name] Kitchen.The 10-year ASCVD risk score (Arnett DK, et al., 2019) is: 8.2%   Values used to calculate the score:     Age: 48 years     Sex: Female     Is Non-Hispanic African American: Yes     Diabetic: No     Tobacco smoker: Yes     Systolic Blood Pressure: 137 mmHg     Is BP treated: Yes     HDL Cholesterol: 50 mg/dL     Total Cholesterol: 194 mg/dL

## 2024-03-18 ENCOUNTER — Ambulatory Visit: Payer: Self-pay | Admitting: Professional

## 2024-03-19 ENCOUNTER — Other Ambulatory Visit (HOSPITAL_BASED_OUTPATIENT_CLINIC_OR_DEPARTMENT_OTHER): Payer: Self-pay

## 2024-03-22 ENCOUNTER — Encounter: Payer: Self-pay | Admitting: Professional

## 2024-03-22 ENCOUNTER — Ambulatory Visit (INDEPENDENT_AMBULATORY_CARE_PROVIDER_SITE_OTHER): Admitting: Professional

## 2024-03-22 DIAGNOSIS — F411 Generalized anxiety disorder: Secondary | ICD-10-CM | POA: Diagnosis not present

## 2024-03-22 DIAGNOSIS — F330 Major depressive disorder, recurrent, mild: Secondary | ICD-10-CM | POA: Diagnosis not present

## 2024-03-22 DIAGNOSIS — F4310 Post-traumatic stress disorder, unspecified: Secondary | ICD-10-CM
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
## 2024-03-29 ENCOUNTER — Telehealth: Payer: Self-pay | Admitting: Physician Assistant

## 2024-03-29 ENCOUNTER — Other Ambulatory Visit (HOSPITAL_BASED_OUTPATIENT_CLINIC_OR_DEPARTMENT_OTHER): Payer: Self-pay

## 2024-03-29 ENCOUNTER — Encounter: Payer: Self-pay | Admitting: Physician Assistant

## 2024-03-29 ENCOUNTER — Ambulatory Visit (INDEPENDENT_AMBULATORY_CARE_PROVIDER_SITE_OTHER): Payer: 59 | Admitting: Physician Assistant

## 2024-03-29 VITALS — BP 132/87 | HR 99 | Ht 62.0 in | Wt 187.0 lb

## 2024-03-29 DIAGNOSIS — E66811 Obesity, class 1: Secondary | ICD-10-CM

## 2024-03-29 DIAGNOSIS — E781 Pure hyperglyceridemia: Secondary | ICD-10-CM

## 2024-03-29 DIAGNOSIS — M47816 Spondylosis without myelopathy or radiculopathy, lumbar region: Secondary | ICD-10-CM

## 2024-03-29 DIAGNOSIS — Z6834 Body mass index (BMI) 34.0-34.9, adult: Secondary | ICD-10-CM | POA: Diagnosis not present

## 2024-03-29 DIAGNOSIS — E6609 Other obesity due to excess calories: Secondary | ICD-10-CM | POA: Diagnosis not present

## 2024-03-29 DIAGNOSIS — R7303 Prediabetes: Secondary | ICD-10-CM | POA: Diagnosis not present

## 2024-03-29 DIAGNOSIS — E7800 Pure hypercholesterolemia, unspecified: Secondary | ICD-10-CM

## 2024-03-29 MED ORDER — ZEPBOUND 2.5 MG/0.5ML ~~LOC~~ SOAJ
2.5000 mg | SUBCUTANEOUS | 0 refills | Status: DC
  Filled 2024-03-29: qty 2, 28d supply, fill #0

## 2024-03-29 NOTE — Telephone Encounter (Unsigned)
 Copied from CRM [id number]. Topic: General - Other >> Mar 29, 2024  3:47 PM Carrielelia G wrote: Reason for CRM: The medication Zepbound per Carmine Christ with [facility] needs a prior authorization.   But if you still do not get the authorization it is probably because the preferred med is [facility].

## 2024-03-29 NOTE — Patient Instructions (Signed)
 Tirzepatide Injection (Weight Management) What is this medication? TIRZEPATIDE (tir ZEP a tide) promotes weight loss. It may also be used to maintain weight loss.  It works by decreasing appetite. Changes to diet and exercise are often combined with this medication. This medicine may be used for other purposes; ask your health care provider or pharmacist if you have questions. COMMON BRAND NAME(S): Zepbound What should I tell my care team before I take this medication? They need to know if you have any of these conditions: Diabetes Eye disease caused by diabetes Gallbladder disease Have or have had depression Have or have had pancreatitis Having surgery Kidney disease Personal or family history of MEN 2, a condition that causes endocrine gland tumors Personal or family history of thyroid cancer Stomach or intestine problems, such as problems digesting food Suicidal thoughts, plans, or attempt An unusual or allergic reaction to tirzepatide, other medications, foods, dyes, or preservatives Pregnant or trying to get pregnant Breastfeeding How should I use this medication? This medication is injected under the skin. You will be taught how to prepare and give it. Take it as directed on the prescription label. Keep taking it unless your care team tells you to stop. It is important that you put your used needles and syringes in a special sharps container. Do not put them in a trash can. If you do not have a sharps container, call your pharmacist or care team to get one. A special MedGuide will be given to you by the pharmacist with each prescription and refill. Be sure to read this information carefully each time. This medication comes with INSTRUCTIONS FOR USE. Ask your pharmacist for directions on how to use this medication. Read the information carefully. Talk to your pharmacist or care team if you have questions. Talk to your care team about the use of this medication in children. Special care  may be needed. Overdosage: If you think you have taken too much of this medicine contact a poison control center or emergency room at once. NOTE: This medicine is only for you. Do not share this medicine with others. What if I miss a dose? If you miss a dose, take it as soon as you can unless it is more than 4 days (96 hours) late. If it is more than 4 days late, skip the missed dose. Take the next dose at the normal time. Do not take 2 doses within 3 days (72 hours) of each other. What may interact with this medication? Certain medications for diabetes, such as insulin, glyburide, glipizide This medication may affect how other medications work. Talk with your care team about all of the medications you take. They may suggest changes to your treatment plan to lower the risk of side effects and to make sure your medications work as intended. This list may not describe all possible interactions. Give your health care provider a list of all the medicines, herbs, non-prescription drugs, or dietary supplements you use. Also tell them if you smoke, drink alcohol, or use illegal drugs. Some items may interact with your medicine. What should I watch for while using this medication? Visit your care team for regular checks on your progress. Tell your care team if your condition does not start to get better or if it gets worse. Tell your care team if you are taking medication to treat diabetes, such as insulin or glipizide. This may increase your risk of low blood sugar. Know the symptoms of low blood sugar and how to  treat it. Talk to your care team about your risk of cancer. You may be more at risk for certain types of cancer if you take this medication. Talk to your care team right away if you have a lump or swelling in your neck, hoarseness that does not go away, trouble swallowing, shortness of breath, or trouble breathing. Make sure you stay hydrated while taking this medication. Drink water often. Eat fruits  and veggies that have a high water content. Drink more water when it is hot or you are active. Talk to your care team right away if you have fever, infection, vomiting, diarrhea, or if you sweat a lot while taking this medication. The loss of too much body fluid may make it dangerous for you to take this medication. If you are going to need surgery or a procedure, tell your care team that you are taking this medication. Estrogen and progestin hormones that you take by mouth may not work as well while you are taking this medication. Switch to a non-oral contraceptive or add a barrier contraceptive for 4 weeks after starting this medication and after each dose increase. Talk to your care team about contraceptive options. They can help you find the option that works for you. What side effects may I notice from receiving this medication? Side effects that you should report to your care team as soon as possible: Allergic reactions or angioedema--skin rash, itching or hives, swelling of the face, eyes, lips, tongue, arms, or legs, trouble swallowing or breathing Bowel blockage--stomach cramping, unable to have a bowel movement or pass gas, loss of appetite, vomiting Change in vision Dehydration--increased thirst, dry mouth, feeling faint or lightheaded, headache, dark yellow or brown urine Gallbladder problems--severe stomach pain, nausea, vomiting, fever Kidney injury--decrease in the amount of urine, swelling of the ankles, hands, or feet Pancreatitis--severe stomach pain that spreads to your back or gets worse after eating or when touched, fever, nausea, vomiting Thoughts of suicide or self-harm, worsening mood, feelings of depression Thyroid cancer--new mass or lump in the neck, pain or trouble swallowing, trouble breathing, hoarseness Side effects that usually do not require medical attention (report these to your care team if they continue or are bothersome): Diarrhea Loss of appetite Nausea Upset  stomach This list may not describe all possible side effects. Call your doctor for medical advice about side effects. You may report side effects to FDA at 1-800-FDA-1088. Where should I keep my medication? Keep out of the reach of children and pets. Store in a refrigerator or at room temperature up to 30 degrees C (86 degrees F). Keep it in the original container. Protect from light. Refrigeration (preferred): Store in the refrigerator. Do not freeze. Get rid of any unused medication after the expiration date. Room temperature: This medication may be stored at room temperature for up to 21 days. If it is stored at room temperature, get rid of any unused medication after 21 days or after it expires, whichever is first. To get rid of medications that are no longer needed or have expired: Take the medication to a medication take-back program. Check with your pharmacy or law enforcement to find a location. If you cannot return the medication, ask your pharmacist or care team how to get rid of this medication safely. NOTE: This sheet is a summary. It may not cover all possible information. If you have questions about this medicine, talk to your doctor, pharmacist, or health care provider.  2024 Elsevier/Gold Standard (2023-11-14 00:00:00)

## 2024-03-30 ENCOUNTER — Other Ambulatory Visit (HOSPITAL_COMMUNITY): Payer: Self-pay

## 2024-03-30 ENCOUNTER — Telehealth: Payer: Self-pay

## 2024-03-30 ENCOUNTER — Other Ambulatory Visit (HOSPITAL_BASED_OUTPATIENT_CLINIC_OR_DEPARTMENT_OTHER): Payer: Self-pay

## 2024-03-30 ENCOUNTER — Encounter: Payer: Self-pay | Admitting: Physician Assistant

## 2024-03-30 ENCOUNTER — Ambulatory Visit: Admitting: Professional

## 2024-03-30 DIAGNOSIS — E7800 Pure hypercholesterolemia, unspecified: Secondary | ICD-10-CM | POA: Insufficient documentation

## 2024-03-30 DIAGNOSIS — E6609 Other obesity due to excess calories: Secondary | ICD-10-CM | POA: Insufficient documentation

## 2024-03-30 DIAGNOSIS — E66811 Obesity, class 1: Secondary | ICD-10-CM | POA: Insufficient documentation

## 2024-03-30 NOTE — Telephone Encounter (Signed)
 Please see below, Patient states she only had [facility] [employer]. Verified via [employer]:

## 2024-03-30 NOTE — Progress Notes (Signed)
   Established Patient Office Visit  Subjective   Patient ID: Somaya Grassi, female    DOB: Apr 29, 1976  Age: 48 y.o. MRN: [id number]  Chief Complaint  Patient presents with   Medical Management of Chronic Issues    6 mo fup wgt management /sleep.    HPI Pt is a 48 yo obese female who presents to the clinic to follow up on weight. She has been trying over the last 5 years consisently to lose weight. She walks regularly but her chronic low back pain makes it hard some days. She has tried weight watchers, calorie counting, food elimination with no benefit. She failed phentermine and wellbutrin for weight due to side effects and contraindications.    ROS See HPI.    Objective:     BP 132/87   Pulse 99   Ht 5\' 2"  (1.575 m)   Wt 187 lb (84.8 kg)   SpO2 99%   BMI 34.20 kg/m  BP Readings from Last 3 Encounters:  03/29/24 132/87  03/15/24 137/82  01/27/24 (!) 143/84   Wt Readings from Last 3 Encounters:  03/29/24 187 lb (84.8 kg)  01/27/24 187 lb (84.8 kg)  12/24/23 184 lb (83.5 kg)      Physical Exam Constitutional:      Appearance: Normal appearance. She is obese.  HENT:     Head: Normocephalic.  Cardiovascular:     Rate and Rhythm: Normal rate and regular rhythm.  Pulmonary:     Effort: Pulmonary effort is normal.     Breath sounds: Normal breath sounds.  Neurological:     General: No focal deficit present.     Mental Status: She is alert and oriented to person, place, and time.  Psychiatric:        Mood and Affect: Mood normal.      The 10-year ASCVD risk score (Arnett DK, et al., 2019) is: 7%    Assessment & Plan:  [name] KitchenMarland KitchenKegan was seen today for medical management of chronic issues.  Diagnoses and all orders for this visit:  Class 1 obesity due to excess calories without serious comorbidity with body mass index (BMI) of 34.0 to 34.9 in adult -     tirzepatide (ZEPBOUND) 2.5 MG/0.5ML Pen; Inject 2.5 mg into the skin once a week.  Lumbar facet  arthropathy -     tirzepatide (ZEPBOUND) 2.5 MG/0.5ML Pen; Inject 2.5 mg into the skin once a week.  Pre-diabetes -     tirzepatide (ZEPBOUND) 2.5 MG/0.5ML Pen; Inject 2.5 mg into the skin once a week.  Elevated LDL cholesterol level -     tirzepatide (ZEPBOUND) 2.5 MG/0.5ML Pen; Inject 2.5 mg into the skin once a week.  Hypertriglyceridemia -     tirzepatide (ZEPBOUND) 2.5 MG/0.5ML Pen; Inject 2.5 mg into the skin once a week.   [name] Kitchen.Discussed low carb diet with 1500 calories and 80g of protein.  Exercising at least 150 minutes a week.  [employer] could be a Chief Technology Officer.  Patient would be perfect to try zepbound with her co-morbidities of pre diabetes/elevated LDL and TG Weight loss could improve her chronic low back pain Failed wellbutrin due to side effects Contraindicated phentermine with PACs Discussed side effects and titration up Follow up in 3 months.      Return in about 3 months (around 06/28/2024).    Tandy Gaw, PA-C

## 2024-03-30 NOTE — Telephone Encounter (Signed)
 Prior Authorization form/request asks a question that requires your assistance. Please see the question below and advise accordingly. The PA will not be submitted until the necessary information is received.

## 2024-03-30 NOTE — Telephone Encounter (Signed)
 Pharmacy Patient Advocate Encounter   Received notification from Pt Calls Messages that prior authorization for Zepbound 2.5 is required/requested.   Insurance verification completed.   The patient is insured through [facility] .   Per test claim: PA required; PA started via CoverMyMeds. KEY [id number] . Waiting for clinical questions to populate.

## 2024-04-01 ENCOUNTER — Other Ambulatory Visit (HOSPITAL_COMMUNITY): Payer: Self-pay

## 2024-04-01 NOTE — Telephone Encounter (Signed)
 Per the Rx auth team, the PA for Zepbound rx was denied. The patient has been updated via a MyChart msg.

## 2024-04-01 NOTE — Telephone Encounter (Signed)
 Pharmacy Patient Advocate Encounter  Received notification from [facility] MEDICAID that Prior Authorization for Zepbound 2.5 has been DENIED.  Full denial letter will be uploaded to the media tab. See denial reason below.   PA #/Case ID/Reference #: [id number]

## 2024-04-02 ENCOUNTER — Encounter: Payer: Self-pay | Admitting: Physician Assistant

## 2024-04-06 ENCOUNTER — Telehealth: Payer: Self-pay

## 2024-04-06 ENCOUNTER — Other Ambulatory Visit (HOSPITAL_BASED_OUTPATIENT_CLINIC_OR_DEPARTMENT_OTHER): Payer: Self-pay

## 2024-04-06 ENCOUNTER — Other Ambulatory Visit (HOSPITAL_COMMUNITY): Payer: Self-pay

## 2024-04-06 MED ORDER — WEGOVY 0.25 MG/0.5ML ~~LOC~~ SOAJ
0.2500 mg | SUBCUTANEOUS | 0 refills | Status: DC
  Filled 2024-04-06 (×2): qty 2, 28d supply, fill #0

## 2024-04-06 MED ORDER — WEGOVY 0.5 MG/0.5ML ~~LOC~~ SOAJ
0.5000 mg | SUBCUTANEOUS | 1 refills | Status: DC
  Filled 2024-04-06 – 2024-05-07 (×2): qty 2, 28d supply, fill #0

## 2024-04-06 NOTE — Telephone Encounter (Signed)
 Pharmacy Patient Advocate Encounter  Received notification from [employer] that Prior Authorization for Wegovy  0.25 has been APPROVED from 04/06/24 to 10/06/24. Ran test claim, Copay is $4.00. This test claim was processed through [facility]- copay amounts may vary at other pharmacies due to pharmacy/plan contracts, or as the patient moves through the different stages of their insurance plan.   PA #/Case ID/Reference #: [id number] Patient last name is misspelled with insurance: Cunnigham.

## 2024-04-06 NOTE — Telephone Encounter (Signed)
 Pharmacy Patient Advocate Encounter   Received notification from Patient Pharmacy that prior authorization for Wegovy  0.25 is required/requested.   Insurance verification completed.   The patient is insured through [facility] .   Per test claim: PA required; PA submitted to above mentioned insurance via CoverMyMeds Key/confirmation #/EOC [employer] Status is pending

## 2024-04-07 ENCOUNTER — Ambulatory Visit: Admitting: Professional

## 2024-04-20 ENCOUNTER — Ambulatory Visit: Admitting: Professional

## 2024-04-21 ENCOUNTER — Other Ambulatory Visit (HOSPITAL_BASED_OUTPATIENT_CLINIC_OR_DEPARTMENT_OTHER): Payer: Self-pay

## 2024-04-21 ENCOUNTER — Ambulatory Visit

## 2024-04-21 ENCOUNTER — Ambulatory Visit
Admission: EM | Admit: 2024-04-21 | Discharge: 2024-04-21 | Disposition: A | Discharge status: Home / Self Care | Attending: Family Medicine | Admitting: Family Medicine

## 2024-04-21 DIAGNOSIS — M25511 Pain in right shoulder: Secondary | ICD-10-CM | POA: Diagnosis not present

## 2024-04-21 DIAGNOSIS — M25512 Pain in left shoulder: Secondary | ICD-10-CM | POA: Diagnosis not present

## 2024-04-21 DIAGNOSIS — S46912A Strain of unspecified muscle, fascia and tendon at shoulder and upper arm level, left arm, initial encounter: Secondary | ICD-10-CM

## 2024-04-21 MED ORDER — PREDNISONE 10 MG PO TABS
ORAL_TABLET | ORAL | 0 refills | Status: AC
Start: 2024-04-21 — End: 2024-05-03
  Filled 2024-04-21: qty 42, 12d supply, fill #0

## 2024-04-21 NOTE — ED Triage Notes (Signed)
 Pt c/o LT shoulder pain x 5 days. Denies injury. Worse at night. Ltd ROM. Hx of rotator cuff tear repair (5+ years)  Tylenol  and biofreeze prn. Has appt with Dr T on 5/14.

## 2024-04-21 NOTE — Discharge Instructions (Addendum)
 Advised patient to take medication as directed with food to completion.  Encouraged increase daily water intake to 64 ounces per day while taking this medication.  Advised if symptoms worsen and/or unresolved please follow-up Dr. Elva Hamburger with [facility] orthopedics for further evaluation.

## 2024-04-21 NOTE — ED Provider Notes (Signed)
 Kelsey Vaughan CARE    CSN: [id number] Arrival date & time: 04/21/24  1118      History   Chief Complaint Chief Complaint  Patient presents with   Shoulder Pain    LT    HPI Kelsey Vaughan is a 48 y.o. female.   HPI 48 year old female presents with left shoulder pain for 3-4 weeks.  She reports has orthopedic appointment with Dr. Elva Hamburger on 04/28/2024. PMH significant for obesity, atrial fibrillation, bipolar 1 disorder mixed/severe.  Past Medical History:  Diagnosis Date   Allergy    Anxiety    Arthritis    Atrial fibrillation (HCC)    a. occuring in 02/2016   B12 deficiency    Complication of anesthesia    Constipation    Depression    Foreign body in foot, left 07/2013   GERD (gastroesophageal reflux disease)    Hematuria    Irregular menses    due to oral contraceptive   Palpitations    a. 01/2017: Event monitor showing SR with PVC's.    PONV (postoperative nausea and vomiting)    Sinus tachycardia    Thyroid  nodule    Vitamin D  deficiency     Patient Active Problem List   Diagnosis Date Noted   Class 1 obesity due to excess calories without serious comorbidity with body mass index (BMI) of 34.0 to 34.9 in adult 03/30/2024   Elevated LDL cholesterol level 03/30/2024   Obesity (BMI 30.0-34.9) 01/28/2024   Multiple thyroid  nodules 10/06/2023   Thyroid  enlarged 10/06/2023   Primary insomnia 09/29/2023   SOB (shortness of breath) 08/15/2023   Non-recurrent acute serous otitis media of left ear 06/03/2023   Panic attacks 01/28/2023   Class 1 obesity due to excess calories without serious comorbidity with body mass index (BMI) of 33.0 to 33.9 in adult 01/28/2023   Pre-diabetes 01/27/2023   Panic disorder 07/22/2022   Blurry vision, bilateral 07/22/2022   Vision changes 01/07/2022   Near syncope 01/07/2022   Medication management 10/19/2021   Frontal lobe and executive function deficit 08/06/2021   Difficulty coping 08/06/2021   Bipolar 1 disorder, mixed,  severe (HCC) 07/09/2021   Mood disorder (HCC) 07/09/2021   Severe episode of recurrent major depressive disorder, without psychotic features (HCC) 07/09/2021   Mass of left side of neck 06/12/2021   Abnormal urine color 06/12/2021   Left elbow pain 10/04/2020   Numbness and tingling of right arm 06/28/2020   Metatarsalgia of right foot 01/10/2020   Choking 11/17/2019   Dysphagia 11/17/2019   Patellofemoral pain syndrome of left knee 10/27/2019   Current smoker 10/22/2019   Anxiety 10/22/2019   Supraspinatus tendon tear 09/13/2019   Chronic left-sided low back pain with left-sided sciatica 06/02/2019   Impulsiveness 02/15/2019   Inattention 02/15/2019   Dry eye of right side 01/18/2019   Pterygium of right eye 01/18/2019   Cramping of feet 01/07/2019   Paresthesia 01/07/2019   Lumbar facet arthropathy 12/17/2018   Acute stress disorder 09/03/2018   Trouble in sleeping 09/03/2018   Fatigue 09/03/2018   Chronic glomerulonephritis 08/11/2018   Elevated fasting glucose 05/25/2018   Hypertriglyceridemia 05/25/2018   Binge-eating disorder, mild 05/20/2018   Irritability and anger 02/23/2018   Mood changes 12/24/2017   Irritable 12/24/2017   Vaginal discharge 11/04/2017   Vaginal irritation 11/04/2017   Lower abdominal pain 11/04/2017   Severe anxiety 10/14/2017   Stress reaction 10/12/2017   Frontal headache 09/16/2017   MDD (major depressive disorder), recurrent episode, mild (  HCC) 09/03/2017   Insulin  resistance 07/30/2017   Epigastric abdominal pain 04/17/2017   Constipation 04/17/2017   Hematuria 04/17/2017   Chronic sinusitis 04/07/2017   Leiomyoma of uterus 02/26/2017   Renal cyst, right 02/26/2017   Sinus tachycardia 02/06/2017   Palpitations 01/29/2017   PAC (premature atrial contraction) 01/29/2017   Vitamin D  deficiency 12/23/2016   B12 deficiency 12/23/2016   Dysmenorrhea 12/19/2016   Bilateral cold feet 12/19/2016   New daily persistent headache 12/19/2016    GAD (generalized anxiety disorder) 12/19/2016   Tobacco dependence 12/19/2016   Tachycardia, paroxysmal (HCC) 03/07/2016   Microscopic hematuria 02/29/2016   Allergy to influenza vaccine 10/02/2015   Class 1 obesity due to excess calories without serious comorbidity with body mass index (BMI) of 30.0 to 30.9 in adult 11/25/2014   Neck strain 11/04/2014   Sinus tarsi syndrome of left ankle 07/16/2013   Plantar fascial fibromatosis of left foot 07/15/2013    Past Surgical History:  Procedure Laterality Date   CESAREAN SECTION     FOREIGN BODY REMOVAL Left 08/05/2013   Procedure: LEFT FOOT FOREIGN BODY REMOVAL ADULT;  Surgeon: Amada Backer, MD;  Location: [facility];  Service: Orthopedics;  Laterality: Left;   SHOULDER ARTHROSCOPY Bilateral    TUBAL LIGATION      OB History     Gravida  3   Para  3   Term  3   Preterm      AB      Living  3      SAB      IAB      Ectopic      Multiple      Live Births               Home Medications    Prior to Admission medications   Medication Sig Start Date End Date Taking? Authorizing Provider  predniSONE  (STERAPRED UNI-PAK 21 TAB) 10 MG (21) TBPK tablet Take 6 tablets (60 mg total) by mouth daily for 2 days, THEN 5 tablets (50 mg total) daily for 2 days, THEN 4 tablets (40 mg total) daily for 2 days, THEN 3 tablets (30 mg total) daily for 2 days, THEN 2 tablets (20 mg total) daily for 2 days, THEN 1 tablet (10 mg total) daily for 2 days. 04/21/24 05/03/24 Yes Leonides Ramp, FNP  aspirin  EC 81 MG tablet Take 1 tablet (81 mg total) by mouth daily. 02/16/20   Goodrich, Callie E, PA-C  Drospirenone  (SLYND ) 4 MG TABS Take 1 tablet (4 mg total) by mouth daily at 6 (six) AM. 03/09/24   Breeback, Jade L, PA-C  DULoxetine  (CYMBALTA ) 30 MG capsule Take 1 capsule (30 mg total) by mouth 2 (two) times daily. 03/09/24   Breeback, Jade L, PA-C  levocetirizine (XYZAL ) 5 MG tablet Take 1 tablet (5 mg total) by mouth every evening.  07/17/20   Breeback, Jade L, PA-C  mometasone  (NASONEX ) 50 MCG/ACT nasal spray One spray in each nostril twice a day, use left hand for right nostril, and right hand for left nostril.  Please dispense one bottle. 07/17/20   Breeback, Jade L, PA-C  nebivolol  (BYSTOLIC ) 5 MG tablet Take 1.5 tablets (7.5 mg total) by mouth 2 (two) times daily. 10/28/23   Millicent Ally, MD  rosuvastatin  (CRESTOR ) 10 MG tablet Take 1 tablet (10 mg total) by mouth daily. Patient not taking: Reported on 01/27/2024 10/28/23 03/31/24  Millicent Ally, MD  SUMAtriptan  (IMITREX ) 100 MG tablet TAKE  1 TABLET (100 MG TOTAL) BY MOUTH ONCE AS NEEDED FOR UP TO 1 DOSE FOR MIGRAINE. MAY REPEAT IN 2 HOURS IF HEADACHE PERSISTS OR RECURS. 04/02/23 04/01/24  Breeback, Jade L, PA-C  tirzepatide  (ZEPBOUND ) 2.5 MG/0.5ML Pen Inject 2.5 mg into the skin once a week. 03/29/24   Breeback, Jade L, PA-C  valACYclovir  (VALTREX ) 1000 MG tablet Take 1 tablet (1,000 mg total) by mouth 2 (two) times daily. 04/04/23   Breeback, Jade L, PA-C  WEGOVY  0.25 MG/0.5ML SOAJ Inject 0.25 mg into the skin once a week. Use this dose for 1 month (4 shots) and then increase to next higher dose. 04/06/24   Breeback, Jade L, PA-C  WEGOVY  0.5 MG/0.5ML SOAJ Inject 0.5 mg into the skin once a week. Use this dose for 1 month (4 shots) and then increase to next higher dose. 05/06/24   Araceli Knight, PA-C    Family History Family History  Problem Relation Age of Onset   Diabetes Mother    Hypertension Mother    Kidney disease Mother    Arthritis Mother    CAD Father    Heart attack Father        died from MI at the age of 65   Heart disease Father    CAD Sister        stents placed at age 67   Asthma Sister    Heart disease Paternal Grandmother    Heart disease Paternal Uncle    Breast cancer Neg Hx    Colon cancer Neg Hx    Colon polyps Neg Hx    Esophageal cancer Neg Hx    Stomach cancer Neg Hx    Rectal cancer Neg Hx     Social History Social History    Tobacco Use   Smoking status: Every Day    Current packs/day: 0.25    Average packs/day: 0.3 packs/day for 6.0 years (1.5 ttl pk-yrs)    Types: Cigarettes   Smokeless tobacco: Never   Tobacco comments:    4-5 cigs/day  Vaping Use   Vaping status: Never Used  Substance Use Topics   Alcohol use: Yes    Comment: occasionally   Drug use: No     Allergies   Influenza vaccines, Latex, Vyvanse  [lisdexamfetamine dimesylate ], Wellbutrin  [bupropion ], Hydrocodone , and Hydrocodone -acetaminophen    Review of Systems Review of Systems  Musculoskeletal:        Left shoulder pain for 3-4 weeks     Physical Exam Triage Vital Signs ED Triage Vitals  Encounter Vitals Group     BP      Systolic BP Percentile      Diastolic BP Percentile      Pulse      Resp      Temp      Temp src      SpO2      Weight      Height      Head Circumference      Peak Flow      Pain Score      Pain Loc      Pain Education      Exclude from Growth Chart    No data found.  Updated Vital Signs BP (!) 136/90 (BP Location: Right Arm)   Pulse (!) 103   Temp 99 F (37.2 C) (Oral)   Resp 17   SpO2 98%    Physical Exam Vitals and nursing note reviewed.  Constitutional:      Appearance:  Normal appearance. She is normal weight.  HENT:     Head: Normocephalic and atraumatic.     Mouth/Throat:     Mouth: Mucous membranes are moist.     Pharynx: Oropharynx is clear.  Eyes:     Extraocular Movements: Extraocular movements intact.     Conjunctiva/sclera: Conjunctivae normal.     Pupils: Pupils are equal, round, and reactive to light.  Cardiovascular:     Rate and Rhythm: Normal rate and regular rhythm.     Pulses: Normal pulses.     Heart sounds: Normal heart sounds.  Pulmonary:     Effort: Pulmonary effort is normal.     Breath sounds: Normal breath sounds. No wheezing, rhonchi or rales.  Musculoskeletal:        General: Normal range of motion.     Cervical back: Normal range of motion  and neck supple.     Comments: Left shoulder (over GH joint): TTP, limited range of motion with forward flexion/extension, internal/external rotation, horizontal abduction/adduction  Skin:    General: Skin is warm and dry.  Neurological:     General: No focal deficit present.     Mental Status: She is alert and oriented to person, place, and time. Mental status is at baseline.  Psychiatric:        Mood and Affect: Mood normal.        Behavior: Behavior normal.      UC Treatments / Results  Labs (all labs ordered are listed, but only abnormal results are displayed) Labs Reviewed - No data to display  EKG   Radiology DG Shoulder Left Result Date: 04/21/2024 CLINICAL DATA:  Left shoulder pain for 3 weeks. EXAM: LEFT SHOULDER - 2+ VIEW COMPARISON:  None Available. FINDINGS: There is no evidence of fracture or dislocation. The alignment and joint spaces are normal. There is no evidence of arthropathy or other focal bone abnormality. Soft tissues are unremarkable. IMPRESSION: Negative radiographs of the left shoulder. Electronically Signed   By: Chadwick Colonel M.D.   On: 04/21/2024 13:17    Procedures Procedures (including critical care time)  Medications Ordered in UC Medications - No data to display  Initial Impression / Assessment and Plan / UC Course  I have reviewed the triage vital signs and the nursing notes.  Pertinent labs & imaging results that were available during my care of the patient were reviewed by me and considered in my medical decision making (see chart for details).     MDM: 1.  Acute pain of left shoulder-left shoulder x-ray results revealed above; 2.  Strain of left shoulder, initial encounter-Rx'd Sterapred Unipak 21 tablet 10 mg taper. Advised patient to take medication as directed with food to completion.  Encouraged increase daily water intake to 64 ounces per day while taking this medication.  Advised if symptoms worsen and/or unresolved please follow-up  Dr. Elva Hamburger with [facility] orthopedics for further evaluation.  Patient discharged home, hemodynamically stable. Final Clinical Impressions(s) / UC Diagnoses   Final diagnoses:  Acute pain of left shoulder  Acute pain of right shoulder  Strain of left shoulder, initial encounter     Discharge Instructions      Advised patient to take medication as directed with food to completion.  Encouraged increase daily water intake to 64 ounces per day while taking this medication.  Advised if symptoms worsen and/or unresolved please follow-up Dr. Elva Hamburger with [facility] orthopedics for further evaluation.     ED Prescriptions  Medication Sig Dispense Auth. Provider   predniSONE  (STERAPRED UNI-PAK 21 TAB) 10 MG (21) TBPK tablet Take 6 tablets (60 mg total) by mouth daily for 2 days, THEN 5 tablets (50 mg total) daily for 2 days, THEN 4 tablets (40 mg total) daily for 2 days, THEN 3 tablets (30 mg total) daily for 2 days, THEN 2 tablets (20 mg total) daily for 2 days, THEN 1 tablet (10 mg total) daily for 2 days. 42 tablet Amanda Pote, FNP      PDMP not reviewed this encounter.   Leonides Ramp, FNP 04/21/24 1340

## 2024-04-23 ENCOUNTER — Ambulatory Visit: Admitting: Sports Medicine

## 2024-04-23 ENCOUNTER — Other Ambulatory Visit (INDEPENDENT_AMBULATORY_CARE_PROVIDER_SITE_OTHER)

## 2024-04-23 DIAGNOSIS — M75102 Unspecified rotator cuff tear or rupture of left shoulder, not specified as traumatic: Secondary | ICD-10-CM | POA: Diagnosis not present

## 2024-04-23 MED ORDER — TRIAMCINOLONE ACETONIDE 40 MG/ML IJ SUSP
40.0000 mg | Freq: Once | INTRAMUSCULAR | Status: AC
  Administered 2024-04-23: 40 mg via INTRAMUSCULAR

## 2024-04-23 NOTE — Progress Notes (Signed)
    Procedures performed today:    Procedure: Real-time Ultrasound Guided injection of the left subacromial bursa Device: Samsung HS60  Verbal informed consent obtained.  Time-out conducted.  Noted no overlying erythema, induration, or other signs of local infection.  Skin prepped in a sterile fashion.  Local anesthesia: Topical Ethyl chloride.  With sterile technique and under real time ultrasound guidance: Noted tearing of the anterior fibers of the supraspinatus, 1 cc Kenalog  40, 1 cc lidocaine , 1 cc bupivacaine  injected easily Completed without difficulty  Advised to call if fevers/chills, erythema, induration, drainage, or persistent bleeding.  Images permanently stored and available for review in PACS.  Impression: Technically successful ultrasound guided injection.  Independent interpretation of notes and tests performed by another provider:   None.  Brief History, Exam, Impression, and Recommendations:    Tear of left supraspinatus tendon Kelsey Vaughan 48 year old female, she has a history of a right subacromial decompression and distal clavicular excision by Dr. Deeann Fare back in 2021. She then had some increasing discomfort left shoulder, pain localized over the deltoid worse with abduction, positive impingement signs on exam, she does have weakness to abduction concerning for supraspinatus tearing. Due to failure of conservative treatment at home and pain at night we will proceed with subacromial injection today, I did see some cuff tearing. She will do home physical therapy, I have ordered an MRI due to her abduction weakness looking for supraspinatus tear. Return to see me in 6 weeks, referral back to Dr. Deeann Fare if not better.    ____________________________________________ Joselyn Nicely. Sandy Crumb, M.D., ABFM., CAQSM., AME. Primary Care and Sports Medicine [facility]  Adjunct Professor of [facility] Medicine  [employer]  Restaurant manager, fast food

## 2024-04-23 NOTE — Assessment & Plan Note (Signed)
 Pleasant 48 year old female, she has a history of a right subacromial decompression and distal clavicular excision by Dr. Deeann Fare back in 2021. She then had some increasing discomfort left shoulder, pain localized over the deltoid worse with abduction, positive impingement signs on exam, she does have weakness to abduction concerning for supraspinatus tearing. Due to failure of conservative treatment at home and pain at night we will proceed with subacromial injection today, I did see some cuff tearing. She will do home physical therapy, I have ordered an MRI due to her abduction weakness looking for supraspinatus tear. Return to see me in 6 weeks, referral back to Dr. Deeann Fare if not better.

## 2024-04-27 ENCOUNTER — Ambulatory Visit (INDEPENDENT_AMBULATORY_CARE_PROVIDER_SITE_OTHER)

## 2024-04-27 DIAGNOSIS — M75102 Unspecified rotator cuff tear or rupture of left shoulder, not specified as traumatic: Secondary | ICD-10-CM | POA: Diagnosis not present

## 2024-04-27 DIAGNOSIS — M7582 Other shoulder lesions, left shoulder: Secondary | ICD-10-CM | POA: Diagnosis not present

## 2024-04-27 DIAGNOSIS — M25512 Pain in left shoulder: Secondary | ICD-10-CM | POA: Diagnosis not present

## 2024-04-27 DIAGNOSIS — M948X1 Other specified disorders of cartilage, shoulder: Secondary | ICD-10-CM | POA: Diagnosis not present

## 2024-04-27 DIAGNOSIS — M19012 Primary osteoarthritis, left shoulder: Secondary | ICD-10-CM | POA: Diagnosis not present

## 2024-04-28 ENCOUNTER — Ambulatory Visit: Admitting: Sports Medicine

## 2024-05-03 ENCOUNTER — Ambulatory Visit: Payer: Self-pay | Admitting: Sports Medicine

## 2024-05-03 ENCOUNTER — Ambulatory Visit: Admitting: Professional

## 2024-05-03 ENCOUNTER — Other Ambulatory Visit (HOSPITAL_BASED_OUTPATIENT_CLINIC_OR_DEPARTMENT_OTHER): Payer: Self-pay

## 2024-05-03 ENCOUNTER — Encounter: Payer: Self-pay | Admitting: Professional

## 2024-05-03 DIAGNOSIS — F4310 Post-traumatic stress disorder, unspecified: Secondary | ICD-10-CM | POA: Diagnosis not present

## 2024-05-03 DIAGNOSIS — F330 Major depressive disorder, recurrent, mild: Secondary | ICD-10-CM

## 2024-05-03 DIAGNOSIS — F411 Generalized anxiety disorder: Secondary | ICD-10-CM
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
## 2024-05-07 ENCOUNTER — Other Ambulatory Visit (HOSPITAL_BASED_OUTPATIENT_CLINIC_OR_DEPARTMENT_OTHER): Payer: Self-pay

## 2024-05-17 ENCOUNTER — Ambulatory Visit (INDEPENDENT_AMBULATORY_CARE_PROVIDER_SITE_OTHER): Admitting: Professional

## 2024-05-17 ENCOUNTER — Encounter: Payer: Self-pay | Admitting: Professional

## 2024-05-17 DIAGNOSIS — F330 Major depressive disorder, recurrent, mild: Secondary | ICD-10-CM

## 2024-05-17 DIAGNOSIS — F4310 Post-traumatic stress disorder, unspecified: Secondary | ICD-10-CM | POA: Diagnosis not present

## 2024-05-17 DIAGNOSIS — F411 Generalized anxiety disorder: Secondary | ICD-10-CM
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
## 2024-05-18 ENCOUNTER — Ambulatory Visit: Admitting: Physician Assistant

## 2024-05-18 ENCOUNTER — Encounter: Payer: Self-pay | Admitting: Physician Assistant

## 2024-05-18 ENCOUNTER — Other Ambulatory Visit (HOSPITAL_BASED_OUTPATIENT_CLINIC_OR_DEPARTMENT_OTHER): Payer: Self-pay

## 2024-05-18 VITALS — BP 116/74 | HR 74 | Ht 62.0 in | Wt 177.0 lb

## 2024-05-18 DIAGNOSIS — H0260 Xanthelasma of unspecified eye, unspecified eyelid: Secondary | ICD-10-CM | POA: Insufficient documentation

## 2024-05-18 DIAGNOSIS — R252 Cramp and spasm: Secondary | ICD-10-CM | POA: Insufficient documentation

## 2024-05-18 DIAGNOSIS — R7303 Prediabetes: Secondary | ICD-10-CM | POA: Diagnosis not present

## 2024-05-18 DIAGNOSIS — H539 Unspecified visual disturbance: Secondary | ICD-10-CM | POA: Diagnosis not present

## 2024-05-18 DIAGNOSIS — Z6834 Body mass index (BMI) 34.0-34.9, adult: Secondary | ICD-10-CM

## 2024-05-18 DIAGNOSIS — E7800 Pure hypercholesterolemia, unspecified: Secondary | ICD-10-CM

## 2024-05-18 DIAGNOSIS — M65332 Trigger finger, left middle finger: Secondary | ICD-10-CM | POA: Diagnosis not present

## 2024-05-18 DIAGNOSIS — E781 Pure hyperglyceridemia: Secondary | ICD-10-CM | POA: Diagnosis not present

## 2024-05-18 DIAGNOSIS — E66811 Obesity, class 1: Secondary | ICD-10-CM

## 2024-05-18 DIAGNOSIS — E6609 Other obesity due to excess calories: Secondary | ICD-10-CM | POA: Diagnosis not present

## 2024-05-18 DIAGNOSIS — H5712 Ocular pain, left eye: Secondary | ICD-10-CM | POA: Insufficient documentation

## 2024-05-18 MED ORDER — WEGOVY 1.7 MG/0.75ML ~~LOC~~ SOAJ
1.7000 mg | SUBCUTANEOUS | 0 refills | Status: DC
  Filled 2024-05-18: qty 3, fill #0

## 2024-05-18 MED ORDER — WEGOVY 1 MG/0.5ML ~~LOC~~ SOAJ
1.0000 mg | SUBCUTANEOUS | 0 refills | Status: DC
  Filled 2024-05-18: qty 2, 28d supply, fill #0

## 2024-05-18 MED ORDER — WEGOVY 2.4 MG/0.75ML ~~LOC~~ SOAJ
2.4000 mg | SUBCUTANEOUS | 11 refills | Status: DC
  Filled 2024-05-18: qty 3, fill #0

## 2024-05-18 NOTE — Progress Notes (Signed)
 Acute Office Visit  Subjective:     Patient ID: Kelsey Vaughan, female    DOB: [birth year], 48 y.o.   MRN: [id number]  Chief Complaint  Patient presents with   Medical Management of Chronic Issues    Blury vision     HPI Patient is in today for blurry vision, eye pain, and occasional muscle spasms. She states that her vision has been getting more blurry over the past couple of years. She also notes that she gets pain behind her left eye when she is on her phone too much, reading, or watching TV. Her eyes are sensitive to light and she has to wear sunglasses to combat this. She last saw her optometrist two years ago who diagnosed her with a pterygium. She was also prescribed bifocal glasses for her vision at this visit but never wore them because she did not like the bifocals. She is concerned about a "patch" that popped up above her eye a year ago; it is not painful. She endorses mild eye redness and occasional white flashes. Denies visual field loss, pinhole vision or floaters.   Last week she had muscle cramps in her back and arms and hands, as well as a trigger finger in her left middle finger while renovating her house. This started shortly after beginning Wegovy . She started taking daily magnesium after the cramps started and her symptoms have resolved.   She is working on weight loss with wegovy . She is tolerating well and on .5mg  dose now. She has lost 10lbs. She is more active and eating less and healthier.   Review of Systems  Eyes:  Positive for blurred vision, photophobia, pain and redness. Negative for discharge.  Musculoskeletal:        Cramps  Neurological:  Negative for tingling and headaches.        Objective:    BP 116/74   Pulse 74   Ht 5\' 2"  (1.575 m)   Wt 177 lb (80.3 kg)   SpO2 99%   BMI 32.37 kg/m  BP Readings from Last 3 Encounters:  05/18/24 116/74  04/21/24 (!) 136/90  03/29/24 132/87   Wt Readings from Last 3 Encounters:  05/18/24 177 lb (80.3  kg)  03/29/24 187 lb (84.8 kg)  01/27/24 187 lb (84.8 kg)      Physical Exam Constitutional:      Appearance: Normal appearance.  Eyes:     General: Vision grossly intact. Gaze aligned appropriately. No allergic shiner or visual field deficit.    Extraocular Movements: Extraocular movements intact.     Conjunctiva/sclera: Conjunctivae normal.     Pupils: Pupils are equal, round, and reactive to light.     Visual Fields: Right eye visual fields normal and left eye visual fields normal.     Comments: Pterygium at 7 o'clock on the left eye, not crossing into the cornea.    Neurological:     Mental Status: She is alert.           Assessment & Plan:  Aaron AasAaron AasCaleesi was seen today for medical management of chronic issues.  Diagnoses and all orders for this visit:  Class 1 obesity due to excess calories without serious comorbidity with body mass index (BMI) of 34.0 to 34.9 in adult -     WEGOVY  1 MG/0.5ML SOAJ; Inject 1 mg into the skin once a week. Use this dose for 1 month (4 shots) and then increase to next higher dose. -     WEGOVY  1.7 MG/0.75ML  SOAJ; Inject 1.7 mg into the skin once a week. Use this dose for 1 month (4 shots) and then increase to next higher dose. -     WEGOVY  2.4 MG/0.75ML SOAJ; Inject 2.4 mg into the skin once a week.  Trigger finger, left middle finger  Pre-diabetes -     WEGOVY  1 MG/0.5ML SOAJ; Inject 1 mg into the skin once a week. Use this dose for 1 month (4 shots) and then increase to next higher dose. -     WEGOVY  1.7 MG/0.75ML SOAJ; Inject 1.7 mg into the skin once a week. Use this dose for 1 month (4 shots) and then increase to next higher dose. -     WEGOVY  2.4 MG/0.75ML SOAJ; Inject 2.4 mg into the skin once a week.  Elevated LDL cholesterol level -     WEGOVY  1 MG/0.5ML SOAJ; Inject 1 mg into the skin once a week. Use this dose for 1 month (4 shots) and then increase to next higher dose. -     WEGOVY  1.7 MG/0.75ML SOAJ; Inject 1.7 mg into the skin  once a week. Use this dose for 1 month (4 shots) and then increase to next higher dose. -     WEGOVY  2.4 MG/0.75ML SOAJ; Inject 2.4 mg into the skin once a week.  Hypertriglyceridemia -     WEGOVY  1 MG/0.5ML SOAJ; Inject 1 mg into the skin once a week. Use this dose for 1 month (4 shots) and then increase to next higher dose. -     WEGOVY  1.7 MG/0.75ML SOAJ; Inject 1.7 mg into the skin once a week. Use this dose for 1 month (4 shots) and then increase to next higher dose. -     WEGOVY  2.4 MG/0.75ML SOAJ; Inject 2.4 mg into the skin once a week.  Muscle cramps -     BMP8+eGFR -     Magnesium  Left eye pain  Vision changes  Xanthelasma   Refilled wegovy  up to 2.4mg  Pt is losing weight and tolerating great Follow up in 3 months  Trigger finger Get brace and remember to ice area Can use diclofenac  gel  If not improving follow up with Dr. Elva Hamburger  Muscle cramps could be potassium or magnesium out of balance Will check today Make sure staying hydrated  Follow up with eye doctor on vision changes  She does have xanthelasma LDL elevated and placed on crestor  Reminded patient to stay on crestor  and will continue to keep LDL goal under 70 Consider removal with dermatology

## 2024-05-18 NOTE — Patient Instructions (Addendum)
Trigger Finger  Trigger finger, also called stenosing tenosynovitis, is a condition that causes a finger or thumb to get stuck in a bent position. Each finger has tough, cord-like tissue (tendon) that connects muscle to bone, and each tendon passes through a tunnel of tissue (tendon sheath). The tendon sheaths are held close to the bone by a pulley. There is a pulley called the A1 pulley that is involved in the triggering of a finger or thumb. To move your finger, your tendon needs to glide freely through the sheath. Trigger finger happens when the tendon or the sheath thickens, making it difficult to bend or straighten your finger as the thickened tendon gets stuck in the A1 pulley. Trigger finger can affect any of the fingers or the thumbs. Mild cases may clear up with rest and medicine. Severe cases require more treatment. What are the causes? Trigger finger or thumb is caused by a thickened finger tendon or tendon sheath. The cause of this thickening is not known. What increases the risk? The following factors may make you more likely to develop this condition: Doing the same movements many times (repetitive activity) that require a strong grip. Having certain health conditions. These include rheumatoid arthritis, gout, carpal tunnel syndrome, or diabetes. Being 40-60 years old. Being female. What are the signs or symptoms? Symptoms of this condition include: Pain when bending or straightening your finger. Tenderness, swelling, or a lump in the palm of your hand just below the finger joint. Hearing a noise like a pop or a snap when you try to straighten your finger. Feeling a catch or locked feeling when you try to straighten your finger. Being unable to straighten your finger without help from your other hand. How is this diagnosed? This condition is diagnosed based on your symptoms and a physical exam. How is this treated? This condition may be treated by: Resting your finger and  avoiding activities that make symptoms worse. Wearing a finger splint to keep your finger extended. Taking NSAIDs, such as ibuprofen, to relieve pain and swelling around the tendon. Doing gentle exercises to stretch the finger as told by your health care provider. Having medicine that reduces swelling and inflammation (steroids) injected into the tendon sheath. Injections may need to be repeated. Trigger finger release. This surgery is done to open the pulley. This may be done if other treatments do not work and you cannot straighten or bend your finger. You may need hand therapy after surgery. Follow these instructions at home: If you have a removable splint: Wear the splint as told by your provider. Remove it only as told by your provider. Check the skin around the splint every day. Tell your provider about any concerns. Loosen the splint if your fingers tingle, become numb, or turn cold and blue. Keep the splint clean and dry. If the splint is not waterproof: Do not let it get wet. Cover it with a watertight covering when you take a bath or shower. Managing pain, stiffness, and swelling     If told, put ice on the painful area. If you have a removable splint, remove it as told by your health care provider. Put ice in a plastic bag. Place a towel between your skin and the bag or between your splint and the bag. Leave the ice on for 20 minutes, 2-3 times a day. If told, apply heat to the affected area as often as told by your provider. Use the heat source that your provider recommends, such as   a moist heat pack or a heating pad. Place a towel between your skin and the heat source. Leave the heat on for 20-30 minutes. If your skin turns bright red, remove the ice or heat right away to prevent skin damage. The risk of damage is higher if you cannot feel pain, heat, or cold. Activity Rest your finger as told by your provider. Avoid activities that make the pain worse. Return to your  normal activities as told by your provider. Ask your provider what activities are safe for you. Do exercises as told by your provider. Ask your provider when it is safe to drive if you have a splint on your hand. General instructions Take over-the-counter and prescription medicines only as told by your provider. Keep all follow-up visits. These are needed to see how you are progressing. Contact a health care provider if: Your symptoms are not improving with home care. This information is not intended to replace advice given to you by your health care provider. Make sure you discuss any questions you have with your health care provider. Document Revised: 07/19/2022 Document Reviewed: 07/19/2022 Elsevier Patient Education  2024 [employer]  

## 2024-05-19 ENCOUNTER — Ambulatory Visit: Payer: Self-pay | Admitting: Physician Assistant

## 2024-05-19 LAB — BMP8+EGFR
BUN/Creatinine Ratio: 10 (ref 9–23)
BUN: 10 mg/dL (ref 6–24)
CO2: 19 mmol/L — ABNORMAL LOW (ref 20–29)
Calcium: 9.2 mg/dL (ref 8.7–10.2)
Chloride: 104 mmol/L (ref 96–106)
Creatinine, Ser: 1.04 mg/dL — ABNORMAL HIGH (ref 0.57–1.00)
Glucose: 83 mg/dL (ref 70–99)
Potassium: 4 mmol/L (ref 3.5–5.2)
Sodium: 139 mmol/L (ref 134–144)
eGFR: 67 mL/min/{1.73_m2} (ref >59)

## 2024-05-19 LAB — MAGNESIUM: Magnesium: 1.9 mg/dL (ref 1.6–2.3)

## 2024-05-19 NOTE — Progress Notes (Signed)
 Magnesium low side of normal. Potassium looks great.

## 2024-05-20 ENCOUNTER — Other Ambulatory Visit: Payer: Self-pay | Admitting: Physician Assistant

## 2024-05-20 ENCOUNTER — Other Ambulatory Visit (HOSPITAL_BASED_OUTPATIENT_CLINIC_OR_DEPARTMENT_OTHER): Payer: Self-pay

## 2024-05-20 DIAGNOSIS — G4452 New daily persistent headache (NDPH): Secondary | ICD-10-CM

## 2024-05-20 MED ORDER — SUMATRIPTAN SUCCINATE 100 MG PO TABS
ORAL_TABLET | ORAL | 5 refills | Status: AC
  Filled 2024-05-20: qty 10, 5d supply, fill #0
  Filled 2024-09-01: qty 9, 5d supply, fill #1
  Filled 2024-10-03: qty 9, 9d supply, fill #1

## 2024-05-20 MED ORDER — VALACYCLOVIR HCL 1 G PO TABS
1000.0000 mg | ORAL_TABLET | Freq: Two times a day (BID) | ORAL | 11 refills | Status: AC
  Filled 2024-05-20: qty 14, 7d supply, fill #0
  Filled 2024-08-04: qty 14, 7d supply, fill #1
  Filled 2024-09-01: qty 14, 7d supply, fill #2

## 2024-05-21 ENCOUNTER — Telehealth: Payer: Self-pay

## 2024-05-21 NOTE — Telephone Encounter (Signed)
 RN returned patient call regarding insurance not covering Slynd  birth control and in need of another similar prescription. Pt reported just opened new pack and is good for one month. RN reported would speak with provider and get back with her.   Evelia Hipp, RN

## 2024-05-25 ENCOUNTER — Encounter: Payer: Self-pay | Admitting: Physician Assistant

## 2024-05-26 ENCOUNTER — Other Ambulatory Visit: Payer: Self-pay

## 2024-05-26 DIAGNOSIS — Z111 Encounter for screening for respiratory tuberculosis: Secondary | ICD-10-CM

## 2024-05-26 NOTE — Telephone Encounter (Signed)
 Spoke to patient. Pt stated she is a past [employer] employee and needed to access her immunization record. Provided [facility] contact information.   Ordered Quant for TB clearance.

## 2024-05-27 ENCOUNTER — Telehealth: Payer: Self-pay

## 2024-05-27 DIAGNOSIS — Z111 Encounter for screening for respiratory tuberculosis: Secondary | ICD-10-CM | POA: Diagnosis not present

## 2024-05-27 NOTE — Telephone Encounter (Signed)
 Pt came in  with concerns of  future follow up apts for her wgt management, she was concerned she would not able to make some of future dated appt due to her starting a new job.  Pt was informed Rodger Civil has an early day which she could schedule in advance for her follow up appts.

## 2024-05-28 ENCOUNTER — Other Ambulatory Visit: Payer: Self-pay | Admitting: Obstetrics and Gynecology

## 2024-05-28 MED ORDER — CYRED EQ 0.15-30 MG-MCG PO TABS
1.0000 | ORAL_TABLET | Freq: Every day | ORAL | 11 refills | Status: AC
  Filled 2024-05-28: qty 28, 28d supply, fill #0
  Filled 2024-08-04: qty 28, 28d supply, fill #1
  Filled 2024-09-01 – 2025-01-03 (×4): qty 28, 28d supply, fill #2

## 2024-05-29 ENCOUNTER — Other Ambulatory Visit (HOSPITAL_BASED_OUTPATIENT_CLINIC_OR_DEPARTMENT_OTHER): Payer: Self-pay

## 2024-05-29 ENCOUNTER — Other Ambulatory Visit: Payer: Self-pay

## 2024-05-31 ENCOUNTER — Encounter: Payer: Self-pay | Admitting: Professional

## 2024-05-31 ENCOUNTER — Ambulatory Visit (INDEPENDENT_AMBULATORY_CARE_PROVIDER_SITE_OTHER): Admitting: Professional

## 2024-05-31 DIAGNOSIS — F4310 Post-traumatic stress disorder, unspecified: Secondary | ICD-10-CM

## 2024-05-31 DIAGNOSIS — F411 Generalized anxiety disorder: Secondary | ICD-10-CM | POA: Diagnosis not present

## 2024-05-31 DIAGNOSIS — F330 Major depressive disorder, recurrent, mild: Secondary | ICD-10-CM | POA: Diagnosis not present

## 2024-05-31 NOTE — Progress Notes (Signed)
 [facility] Counselor/Therapist Progress Note  Patient ID: Kelsey Vaughan, MRN: [id number],    Date: 05/31/2024  Time Spent: 54 minutes [id number]  Treatment Type: Individual therapy  Risk Assessment: Danger to Self:  No Self-injurious Behavior: No Danger to Others: No  Subjective: This session was held via video teletherapy. The patient consented to video teletherapy and was located at her home during this session. She is aware it is the responsibility of the patient to secure confidentiality on her end of the session. The provider was in a private home office for the duration of this session.    The patient arrived on time for her Caregility session.  1-professional -starts new job at [employer] in [place] -interviewed Saturday after our appointment and did well -pt determine that she would not let her trying to find work get her down -she decided to go back to [employer] if necessary -she receive an email notice from prospective employer to call -employer reports that MD (Dr. 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 2-self-confidence -pt is easily discouraged and admits to needing increased patience -when her 48 year old granddaughter said for her to have patience it was a big reminder -her son has been her cheerleader; he congratulated her several days before the offer -pt feels happier with herself since she has been setting boundaries 3-workman's comp case settled in February 4-Father's Day -she is usually in the bed and it is a down day for her -she felt zero sadness yesterday -she was going to visit her dad in the cemetery in [place] and because of the weather she turned around -she told herself to ride through the storms to get to the other side -reminded pt that she did not fail -pt wants to regularly visit him 5-family relationships -pt thinks she  is impacted daily at least a little bit -pt desires a close relationship with her siblings where they are calling each other and catching up -pt wants increased interactions with her sisters -pt feels peace in her life right now but at this moment she is experiencing peace in her lie and does not want to compromise -pt declined to volunteer to transport her mother to an appointment -she texted her brother a happy father's day -he called her about 45 minutes later and wished her a happy father's day and she declined it since she is a mother -he called several hours later and told her to pick up her momma and take her to take her to another family member's house for dinner -pt declined to do as requested because she was unwilling to compromise her peace 6-childhood -pt felt safe for 48 years when she was with her father because he was listening to her -she was shut down after her father died and no one appeared to be interested in what she was thinking or feeling -pt realizes that she does not always communicate in a healthy way but she resolved to be sure that her voice is heard since adolescence -pt admits how her communication is still compromised  Treatment Plan Problems: Bipolar Disorder - Mania, Childhood Trauma, Sleep Disturbance, Anger Control Problems Symptoms: Shows a pattern of episodic excessive anger in response to specific situations or situational themes. Shows direct or indirect evidence of physiological arousal related to anger. Reports a history of explosive, aggressive outbursts out of proportion with any precipitating stressors, leading  to verbal attacks, assaultive acts, or destruction of property. Displays overreactive verbal hostility to insignificant irritants. Makes swift and harsh judgmental statements to or about others. Displays body language suggesting anger, including tense muscles (e.g., clenched fist or jaw), glaring looks, or refusal to make eye contact. Shows  passive-aggressive patterns (e.g., social withdrawal, lack of complete or timely compliance in following directions or rules, complaining about authority figures behind their backs, uncooperative in meeting expected behavioral norms) due to anger. Passively withholds feelings and then explodes in a rage. Demonstrates an angry overreaction to perceived disapproval, rejection, or criticism. Uses abusive language meant to intimidate others. Rationalizes and blames others for aggressive and abusive behavior. Uses aggression as a means of achieving power and control. Exhibits an abnormally and persistently elevated, expansive, or irritable mood with at least three symptoms of mania (i.e., inflated self-esteem or grandiosity, decreased need for sleep, pressured speech, flight of ideas, distractibility, excessive goal-directed activity or psychomotor agitation, excessive involvement in pleasurable, high-risk behavior). The elevated mood or irritability (mania) causes marked impairment in occupational functioning, social activities, or relationships with others. Reports flight of ideas or thoughts racing. Verbalizes grandiose ideas and/or persecutory beliefs. Shows evidence of a decreased need for sleep. Reports little or no appetite. Exhibits increased motor activity or agitation. Displays a poor attention span and is easily distracted. Loss of normal inhibition leads to impulsive and excessive pleasure-oriented behavior without regard for painful consequences. Exhibits an expansive mood that can easily turn to impatience and irritable anger if goal-oriented behavior is blocked or confronted. Lacks follow-through in projects, even though energy is very high, since behavior lacks discipline and goal-directedness. Reports of childhood physical, sexual, and/or emotional abuse. Description of parents as physically or emotionally neglectful as they were chemically dependent, too busy, absent, etc. Description  of childhood as chaotic as parent(s) was substance abuser (or mentally ill, antisocial, etc.), leading to frequent moves, multiple abusive spousal partners, frequent substitute caretakers, financial pressures, and/or many stepsiblings. Irrational fears, suppressed rage, low self-esteem, identity conflicts, depression, or anxious insecurity related to painful early life experiences. Complains of difficulty falling asleep. Complains of difficulty remaining asleep. Insomnia or hypersomnia complaints due to a reversal of the normal sleep-wake schedule. Goals: Learn and implement anger management skills to reduce the level of anger and irritability that accompanies it. Increase respectful communication through the use of assertiveness and conflict resolution skills. Develop an awareness of angry thoughts, feelings, and actions, clarifying origins of, and learning alternatives to aggressive anger. Decrease the frequency, intensity, and duration of angry thoughts, feelings, and actions and increase the ability to recognize and respectfully express frustration and resolve conflict. Implement cognitive behavioral skills necessary to solve problems in a more constructive manner. Come to an awareness and acceptance of angry feelings while developing better control and more serenity. Become capable of handling angry feelings in constructive ways that enhance daily functioning. Demonstrate respect for others and their feelings. Alleviate manic/hypomanic mood and return to previous level of effective functioning. Normalize energy level and return to usual activities, good judgment, stable mood, more realistic expectations, and goal-directed behavior. Reduce agitation, impulsivity, and pressured speech while achieving sensitivity to the consequences of behavior and having more realistic expectations. Achieve controlled behavior, moderated mood, more deliberative speech and thought process, and a stable daily  activity pattern. Develop healthy cognitive patterns and beliefs about self and the world that lead to alleviation and help prevent the relapse of manic/hypomanic episodes. Talk about underlying feelings of low self-esteem or guilt and  fears of rejection, dependency, and abandonment. Develop an awareness of how childhood issues have affected and continue to affect one's family life. Resolve past childhood/family issues, leading to less anger and depression, greater self-esteem, security, and confidence. Release the emotions associated with past childhood/family issues, resulting in less resentment and more serenity. Let go of blame and begin to forgive others for pain caused in childhood. Restore restful sleep pattern. Feel refreshed and energetic during wakeful hours. Objectives target date for all objectives is 02/16/2025: Keep a daily journal of persons, situations, and other triggers of anger; record thoughts, feelings, and actions taken. Verbalize increased awareness of anger expression patterns, their causes, and their consequences. Learn and implement calming and coping strategies as part of an overall approach to managing anger. Identify, challenge, and replace anger-inducing self-talk with self-talk that facilitates a less angry reaction. Learn and implement thought-stopping to manage intrusive unwanted thoughts that trigger anger. Verbalize an understanding of assertive communication and how it can be used to express thoughts and feelings of anger in a controlled, respectful way. Describe mood state, energy level, amount of control over thoughts, and sleeping pattern. Identify and replace thoughts and behaviors that trigger manic or depressive symptoms. Differentiate between real and imagined losses, rejections, and abandonments. Verbalize grief, fear, and anger regarding real or imagined losses in life. Acknowledge the low self-esteem and fear of rejection that underlie the  braggadocio. Describe what it was like to grow up in the home environment. Describe each family member and identify the role each played within the family. Identify patterns of abuse, neglect, or abandonment within the family of origin, both current and historical, nuclear and extended. Identify feelings associated with major traumatic incidents in childhood and with parental child-rearing patterns. Identify how own parenting has been influenced by childhood experiences. Identify the positive aspects for self of being able to forgive all those involved with the abuse. Describe the history and details of sleep pattern. Verbalize depressive or anxious feelings and share possible causes. Verbalize an understanding of normal sleep, sleep disturbances, and their treatment. Learn and implement calming skills for use at bedtime. Practice good sleep hygiene. Learn and implement stimulus control strategies to establish a consistent sleep-wake rhythm. Learn and implement a sleep restriction method to increase sleep efficiency. Learn and implement skills for managing stresses contributing to the sleep problem. Interventions: Teach the client calming techniques (e.g., progressive muscle relaxation, breathing induced relaxation, calming imagery, cue-controlled relaxation, applied relaxation, mindful breathing) as part of a tailored strategy for reducing chronic and acute physiological tension that accompanies the escalation of his/her angry feelings. Explore the client's self-talk that mediates his/her angry feelings and actions (e.g., demanding expectations reflected in should, must, or have-to statements); identify and challenge biases, assisting him/her in generating appraisals and self-talk that corrects for the biases and facilitates a more flexible and temperate response to frustration. Combine new self-talk with calming skills as part of a set of coping skills to manage anger. Role-play the use of  relaxation and cognitive coping to visualized anger-provoking scenes, moving from low- to high-anger scenes. Assign the implementation of calming techniques in his/her daily life and when facing anger-triggering situations; process the results, reinforcing success and problem-solving obstacles. Assign the client to implement a thought-stopping technique in which he/she shouts STOP to himself/herself in his/her mind and then replaces the thought with an alternative that is calming (or assign Making Use of the Thought-Stopping Technique in the Adult Psychotherapy Homework Planner by [facility]); review implantation, reinforcing success and providing  corrective feedback for failure. Use instruction, modeling, and/or role-playing to teach the client the distinctive elements as well as the pros and cons of assertive, unassertive (passive), and aggressive communication. Ask the client to self-monitor, keeping a daily journal in which he/she documents persons, situations, thoughts, feelings, and actions associated with moments of anger, irritation, or disappointment (or assign Anger Journal in the Adult Psychotherapy Homework Planner by Beacher Bottoms); routinely process the journal toward helping the client understand his/her contributions to generating his/her anger. Assist the client in generating a list of anger triggers; process the list toward helping the client understand the causes and expressions of his/her anger. Assist the client in re-conceptualizing anger as involving different dimensions (cognitive, physiological, affective, and behavioral) that interact predictably (e.g., demanding expectations not being met leading to increased arousal and anger leading to acting out) and that can be understood, challenged, and changed. Process the client's list of anger triggers and other relevant journal information toward helping the client understand how cognitive, physiological, and affective factors interplay to  produce anger. Ask the client to list and discuss ways anger has negatively impacted his/her daily life (e.g., hurting others or self, legal conflicts, loss of respect from self and others, destruction of property); process this list. Assist the client in identifying the positive consequences of managing anger (e.g., respect from others and self, cooperation from others, improved physical health, etc.) (or assign Alternatives to Destructive Anger in the Adult Psychotherapy Homework Planner by Beacher Bottoms). Assess the client's sleep history including sleep pattern, bedtime routine, activities associated  with the bed, activity level while awake, nutritional habits including stimulant use, napping practice, actual sleep time, rhythm of time for being awake versus sleeping, and so on. Teach the client stimulus control techniques (e.g., lie down to sleep only when sleepy; do not use the bed for activities like watching television, reading, listening to music, but only for sleep or sexual activity; get out of bed if sleep doesn't arrive soon after retiring; lie back down when sleepy; set alarm to the same wake-up time every morning regardless of sleep time or quality; do not nap during the day); assign consistent implementation. Instruct the client to move activities associated with arousal and activation from the bedtime ritual to other times during the day (e.g., reading stimulating content, reviewing day's events, planning for next day, watching disturbing television). Use cognitive behavioral skills training techniques (e.g., instruction, covert modeling [i.e., imagining the successful use of the strategies], role-play, practice, and generalization training) to teach the client tailored skills (e.g., calming and coping skills, conflict-resolution, problem-solving) for managing stressors related to the sleep disturbance (e.g., interpersonal conflicts that carry over and cause nighttime wakefulness); routinely  review, reinforce successes, problem-solve obstacles toward effective everyday use (see Insomnia: A Clinical Guide to Assessment and Treatment by Morin and Espie; Treating Sleep Disorders by Adah Hollering and Lichstein). Assess the role of depression or anxiety as the cause of the client's sleep disturbance (see the Unipolar Depression or Anxiety chapters in this Planner). Provide the client with basic sleep education (e.g., normal length of sleep, normal variations of sleep, normal time to fall asleep, and normal midnight awakening; recommend The Insomnia Workbook: A Comprehensive Guide to Getting the Sleep You Need by Silberman); help the client understand the exact nature of his/her abnormal sleeping pattern. Teach the client relaxation skills (e.g., progressive muscle relaxation, guided imagery, slow diaphragmatic breathing); teach the client how to apply these skills to facilitate relaxation and sleep at bedtime (see Bedtime Relaxation Techniques by  Rande Bushy and Floral). Instruct the client in sleep hygiene practices such as restricting excessive liquid intake, spicy late night snacks, or heavy evening meals; exercising regularly, but not within 3 - 4 hours of bedtime; minimizing or avoiding caffeine, alcohol, tobacco, and stimulant intake (or assign Sleep Pattern Record in the Adult Psychotherapy Homework Planner by [facility]). Encourage the client to share his/her thoughts and feelings; express empathy, and build rapport while assessing primary cognitive, behavioral, interpersonal, or other symptoms of the mood disorder. Assess presence, severity, and impact of past and present mood episodes including mania (i.e., pressured speech, impulsive behavior, euphoric mood, flight of ideas, reduced need for sleep, inflated self-esteem, and high energy) on social, occupational, and interpersonal functioning; supplement with semi-structured inventory, if desired (e.g., Young Mania Rating Scale; the Clinical  Monitoring Form); re-administer as indicated to assess treatment response. Use cognitive therapy techniques to explore and educate the client's about cognitive biases that trigger his/her elevated or depressive mood (see Cognitive Therapy for Bipolar Disorder by Eloisa Hait al.). Teach the client cognitive behavioral coping and relapse prevention skills including delaying impulsive actions, structured scheduling of daily activities, keeping a regular sleep routine, avoiding unrealistic goal striving, using relaxation procedures, identifying and avoiding episode triggers such as stimulant drug use, alcohol consumption, breaking sleep routine, or exposing self to high stress (see Cognitive Therapy for Bipolar Disorder by Eloisa Hait al.). Pledge to be there consistently to help, listen to, and support the client. Explore the client's fears of abandonment by sources of love and nurturance. Help the client differentiate between real and imagined, actual and exaggerated losses. Probe real or perceived losses in the client's life. Review ways for the client to replace the losses and put them in perspective. Probe the causes for the client's low self-esteem and abandonment fears in the family-of-origin history. Develop the client's family genogram and/or symptom line and help identify patterns of dysfunction within the family. Assign the client to write a forgiveness letter to the perpetrator of abuse (or assign Feelings and Forgiveness Letter in the Adult Psychotherapy Homework Planner by Beacher Bottoms); process the letter. Teach the client the benefits (i.e., release of hurt and anger, putting issue in the past, opens door for trust of others, etc.) of beginning a process of forgiveness of (not necessarily forgetting or fraternizing with) abusive adults. Assist the client in clarifying his/her role within the family and his/her feelings connected to that role. Explore the client's painful childhood experiences (or assign  Share the Painful Memory in the Adult Psychotherapy Homework Planner by Beacher Bottoms). Support and encourage the client when he/she begins to express feelings of rage, sadness, fear, and rejection relating to family abuse or neglect. Assign the client to record feelings in a journal that describes memories, behavior, and emotions tied to his/her traumatic childhood experiences (or assign How the Trauma Affects Me in the Adult Psychotherapy Homework Planner by Beacher Bottoms). Ask the client to compare his/her parenting behavior to that of parent figures of his/her childhood; encourage the client to be aware of how easily we repeat patterns that we grew up with.   Diagnosis:MDD (major depressive disorder), recurrent episode, mild (HCC)  GAD (generalized anxiety disorder)  PTSD (post-traumatic stress disorder)  R/O Bipolar Disorder  Plan:  -meet again on Monday, June 07, 2024 at 1pm

## 2024-06-01 ENCOUNTER — Ambulatory Visit: Payer: Self-pay | Admitting: Physician Assistant

## 2024-06-01 LAB — QUANTIFERON-TB GOLD PLUS
QuantiFERON Nil Value: 0.02 [IU]/mL
QuantiFERON TB1 Ag Value: 0.03 [IU]/mL
QuantiFERON TB2 Ag Value: 0.02 [IU]/mL

## 2024-06-01 NOTE — Progress Notes (Signed)
Negative TB screen.

## 2024-06-04 ENCOUNTER — Ambulatory Visit: Admitting: Sports Medicine

## 2024-06-04 ENCOUNTER — Other Ambulatory Visit (HOSPITAL_BASED_OUTPATIENT_CLINIC_OR_DEPARTMENT_OTHER): Payer: Self-pay

## 2024-06-07 ENCOUNTER — Ambulatory Visit (INDEPENDENT_AMBULATORY_CARE_PROVIDER_SITE_OTHER): Admitting: Professional

## 2024-06-07 ENCOUNTER — Encounter: Payer: Self-pay | Admitting: Professional

## 2024-06-07 DIAGNOSIS — F4310 Post-traumatic stress disorder, unspecified: Secondary | ICD-10-CM

## 2024-06-07 DIAGNOSIS — F330 Major depressive disorder, recurrent, mild: Secondary | ICD-10-CM | POA: Diagnosis not present

## 2024-06-07 DIAGNOSIS — F411 Generalized anxiety disorder: Secondary | ICD-10-CM

## 2024-06-07 NOTE — Progress Notes (Signed)
 [facility] Counselor/Therapist Progress Note  Patient ID: Kelsey Vaughan, MRN: [id number],    Date: 06/07/2024  Time Spent:  30 minutes [id number]  Treatment Type: Individual therapy  Risk Assessment: Danger to Self:  No Self-injurious Behavior: No Danger to Others: No  Subjective: This session was held via video teletherapy. The patient consented to video teletherapy and was located at her home during this session. She is aware it is the responsibility of the patient to secure confidentiality on her end of the session. The provider was in a private home office for the duration of this session.    The patient arrived on time for her Caregility session.  1-professional -first day of work today and is thus far very satisfied -she likes her coworkers thus far -it's a positive atmosphere -she feels good vibes on her first day -the PCP appears really easy to work with if you follow her preferred structure -for MD to give her the opportunity and thus far believes it is a great fit I'm supposed to be here -pt is living her life right now and not allowing things to sap her energy and is working on being in control of her feelings -lunch will be 1-2 daily 2-therapy -wants to move to biweekly appointments -adjusted schedule with patient and will modify with admin -pt still wants to address estrangement issues with daughter and son -she has relationship with one son  3-progress- Pt feels she is making progress in her ability to discriminate what to invest her emotional energy in a positive way -pt admits that she is choosing to make decisions that are based on what she needs -pt canceled plans this weekend because she needed to prepare for her work week -pt cancelled her attendance at friend's cookout to get what she needed for her job -she declined going to a picnic 6.5 hours to [place] to come back to try and start work -pt felt upset when her friend told her what her bf  said; not to invite the pt moving forward -pt was quiet and did not react hastily -pt verbalized after listening and verbalized her feelings -pt felt a certain way based on what her bff said -pt plans to allow distance -pt was hurt given her daily and she did not address her thoughts and feelings with the pt but talked to her bf -pt admits she needed to evaluate that friendship given this this friend came to [place] every two weeks and pt was not invited for a visit -the only way the friend came to see her was when she had something for her -pt has decided to no longer invest energy is the relationship and to hold her boundary and her peace  Treatment Plan Problems: Bipolar Disorder - Mania, Childhood Trauma, Sleep Disturbance, Anger Control Problems Symptoms: Shows a pattern of episodic excessive anger in response to specific situations or situational themes. Shows direct or indirect evidence of physiological arousal related to anger. Reports a history of explosive, aggressive outbursts out of proportion with any precipitating stressors, leading to verbal attacks, assaultive acts, or destruction of property. Displays overreactive verbal hostility to insignificant irritants. Makes swift and harsh judgmental statements to or about others. Displays body language suggesting anger, including tense muscles (e.g., clenched fist or jaw), glaring looks, or refusal to make eye contact. Shows passive-aggressive patterns (e.g., social withdrawal, lack of complete or timely compliance in following directions or rules, complaining about authority figures behind their backs, uncooperative in meeting expected behavioral norms) due  to anger. Passively withholds feelings and then explodes in a rage. Demonstrates an angry overreaction to perceived disapproval, rejection, or criticism. Uses abusive language meant to intimidate others. Rationalizes and blames others for aggressive and abusive behavior. Uses  aggression as a means of achieving power and control. Exhibits an abnormally and persistently elevated, expansive, or irritable mood with at least three symptoms of mania (i.e., inflated self-esteem or grandiosity, decreased need for sleep, pressured speech, flight of ideas, distractibility, excessive goal-directed activity or psychomotor agitation, excessive involvement in pleasurable, high-risk behavior). The elevated mood or irritability (mania) causes marked impairment in occupational functioning, social activities, or relationships with others. Reports flight of ideas or thoughts racing. Verbalizes grandiose ideas and/or persecutory beliefs. Shows evidence of a decreased need for sleep. Reports little or no appetite. Exhibits increased motor activity or agitation. Displays a poor attention span and is easily distracted. Loss of normal inhibition leads to impulsive and excessive pleasure-oriented behavior without regard for painful consequences. Exhibits an expansive mood that can easily turn to impatience and irritable anger if goal-oriented behavior is blocked or confronted. Lacks follow-through in projects, even though energy is very high, since behavior lacks discipline and goal-directedness. Reports of childhood physical, sexual, and/or emotional abuse. Description of parents as physically or emotionally neglectful as they were chemically dependent, too busy, absent, etc. Description of childhood as chaotic as parent(s) was substance abuser (or mentally ill, antisocial, etc.), leading to frequent moves, multiple abusive spousal partners, frequent substitute caretakers, financial pressures, and/or many stepsiblings. Irrational fears, suppressed rage, low self-esteem, identity conflicts, depression, or anxious insecurity related to painful early life experiences. Complains of difficulty falling asleep. Complains of difficulty remaining asleep. Insomnia or hypersomnia complaints due to a  reversal of the normal sleep-wake schedule. Goals: Learn and implement anger management skills to reduce the level of anger and irritability that accompanies it. Increase respectful communication through the use of assertiveness and conflict resolution skills. Develop an awareness of angry thoughts, feelings, and actions, clarifying origins of, and learning alternatives to aggressive anger. Decrease the frequency, intensity, and duration of angry thoughts, feelings, and actions and increase the ability to recognize and respectfully express frustration and resolve conflict. Implement cognitive behavioral skills necessary to solve problems in a more constructive manner. Come to an awareness and acceptance of angry feelings while developing better control and more serenity. Become capable of handling angry feelings in constructive ways that enhance daily functioning. Demonstrate respect for others and their feelings. Alleviate manic/hypomanic mood and return to previous level of effective functioning. Normalize energy level and return to usual activities, good judgment, stable mood, more realistic expectations, and goal-directed behavior. Reduce agitation, impulsivity, and pressured speech while achieving sensitivity to the consequences of behavior and having more realistic expectations. Achieve controlled behavior, moderated mood, more deliberative speech and thought process, and a stable daily activity pattern. Develop healthy cognitive patterns and beliefs about self and the world that lead to alleviation and help prevent the relapse of manic/hypomanic episodes. Talk about underlying feelings of low self-esteem or guilt and fears of rejection, dependency, and abandonment. Develop an awareness of how childhood issues have affected and continue to affect one's family life. Resolve past childhood/family issues, leading to less anger and depression, greater self-esteem, security, and  confidence. Release the emotions associated with past childhood/family issues, resulting in less resentment and more serenity. Let go of blame and begin to forgive others for pain caused in childhood. Restore restful sleep pattern. Feel refreshed and energetic during wakeful hours. Objectives  target date for all objectives is 02/16/2025: Keep a daily journal of persons, situations, and other triggers of anger; record thoughts, feelings, and actions taken. Verbalize increased awareness of anger expression patterns, their causes, and their consequences. Learn and implement calming and coping strategies as part of an overall approach to managing anger. Identify, challenge, and replace anger-inducing self-talk with self-talk that facilitates a less angry reaction. Learn and implement thought-stopping to manage intrusive unwanted thoughts that trigger anger. Verbalize an understanding of assertive communication and how it can be used to express thoughts and feelings of anger in a controlled, respectful way. Describe mood state, energy level, amount of control over thoughts, and sleeping pattern. Identify and replace thoughts and behaviors that trigger manic or depressive symptoms. Differentiate between real and imagined losses, rejections, and abandonments. Verbalize grief, fear, and anger regarding real or imagined losses in life. Acknowledge the low self-esteem and fear of rejection that underlie the braggadocio. Describe what it was like to grow up in the home environment. Describe each family member and identify the role each played within the family. Identify patterns of abuse, neglect, or abandonment within the family of origin, both current and historical, nuclear and extended. Identify feelings associated with major traumatic incidents in childhood and with parental child-rearing patterns. Identify how own parenting has been influenced by childhood experiences. Identify the positive aspects  for self of being able to forgive all those involved with the abuse. Describe the history and details of sleep pattern. Verbalize depressive or anxious feelings and share possible causes. Verbalize an understanding of normal sleep, sleep disturbances, and their treatment. Learn and implement calming skills for use at bedtime. Practice good sleep hygiene. Learn and implement stimulus control strategies to establish a consistent sleep-wake rhythm. Learn and implement a sleep restriction method to increase sleep efficiency. Learn and implement skills for managing stresses contributing to the sleep problem. Interventions: Teach the client calming techniques (e.g., progressive muscle relaxation, breathing induced relaxation, calming imagery, cue-controlled relaxation, applied relaxation, mindful breathing) as part of a tailored strategy for reducing chronic and acute physiological tension that accompanies the escalation of his/her angry feelings. Explore the client's self-talk that mediates his/her angry feelings and actions (e.g., demanding expectations reflected in should, must, or have-to statements); identify and challenge biases, assisting him/her in generating appraisals and self-talk that corrects for the biases and facilitates a more flexible and temperate response to frustration. Combine new self-talk with calming skills as part of a set of coping skills to manage anger. Role-play the use of relaxation and cognitive coping to visualized anger-provoking scenes, moving from low- to high-anger scenes. Assign the implementation of calming techniques in his/her daily life and when facing anger-triggering situations; process the results, reinforcing success and problem-solving obstacles. Assign the client to implement a thought-stopping technique in which he/she shouts STOP to himself/herself in his/her mind and then replaces the thought with an alternative that is calming (or assign Making Use of the  Thought-Stopping Technique in the Adult Psychotherapy Homework Planner by [facility]); review implantation, reinforcing success and providing corrective feedback for failure. Use instruction, modeling, and/or role-playing to teach the client the distinctive elements as well as the pros and cons of assertive, unassertive (passive), and aggressive communication. Ask the client to self-monitor, keeping a daily journal in which he/she documents persons, situations, thoughts, feelings, and actions associated with moments of anger, irritation, or disappointment (or assign Anger Journal in the Adult Psychotherapy Homework Planner by Jenniffer); routinely process the journal toward helping the client understand  his/her contributions to generating his/her anger. Assist the client in generating a list of anger triggers; process the list toward helping the client understand the causes and expressions of his/her anger. Assist the client in re-conceptualizing anger as involving different dimensions (cognitive, physiological, affective, and behavioral) that interact predictably (e.g., demanding expectations not being met leading to increased arousal and anger leading to acting out) and that can be understood, challenged, and changed. Process the client's list of anger triggers and other relevant journal information toward helping the client understand how cognitive, physiological, and affective factors interplay to produce anger. Ask the client to list and discuss ways anger has negatively impacted his/her daily life (e.g., hurting others or self, legal conflicts, loss of respect from self and others, destruction of property); process this list. Assist the client in identifying the positive consequences of managing anger (e.g., respect from others and self, cooperation from others, improved physical health, etc.) (or assign Alternatives to Destructive Anger in the Adult Psychotherapy Homework Planner by Jenniffer). Assess  the client's sleep history including sleep pattern, bedtime routine, activities associated  with the bed, activity level while awake, nutritional habits including stimulant use, napping practice, actual sleep time, rhythm of time for being awake versus sleeping, and so on. Teach the client stimulus control techniques (e.g., lie down to sleep only when sleepy; do not use the bed for activities like watching television, reading, listening to music, but only for sleep or sexual activity; get out of bed if sleep doesn't arrive soon after retiring; lie back down when sleepy; set alarm to the same wake-up time every morning regardless of sleep time or quality; do not nap during the day); assign consistent implementation. Instruct the client to move activities associated with arousal and activation from the bedtime ritual to other times during the day (e.g., reading stimulating content, reviewing day's events, planning for next day, watching disturbing television). Use cognitive behavioral skills training techniques (e.g., instruction, covert modeling [i.e., imagining the successful use of the strategies], role-play, practice, and generalization training) to teach the client tailored skills (e.g., calming and coping skills, conflict-resolution, problem-solving) for managing stressors related to the sleep disturbance (e.g., interpersonal conflicts that carry over and cause nighttime wakefulness); routinely review, reinforce successes, problem-solve obstacles toward effective everyday use (see Insomnia: A Clinical Guide to Assessment and Treatment by Morin and Espie; Treating Sleep Disorders by Michaeleen Pay and Lichstein). Assess the role of depression or anxiety as the cause of the client's sleep disturbance (see the Unipolar Depression or Anxiety chapters in this Planner). Provide the client with basic sleep education (e.g., normal length of sleep, normal variations of sleep, normal time to fall asleep, and normal  midnight awakening; recommend The Insomnia Workbook: A Comprehensive Guide to Getting the Sleep You Need by Silberman); help the client understand the exact nature of his/her abnormal sleeping pattern. Teach the client relaxation skills (e.g., progressive muscle relaxation, guided imagery, slow diaphragmatic breathing); teach the client how to apply these skills to facilitate relaxation and sleep at bedtime (see Bedtime Relaxation Techniques by Otto armin Carbon). Instruct the client in sleep hygiene practices such as restricting excessive liquid intake, spicy late night snacks, or heavy evening meals; exercising regularly, but not within 3 - 4 hours of bedtime; minimizing or avoiding caffeine, alcohol, tobacco, and stimulant intake (or assign Sleep Pattern Record in the Adult Psychotherapy Homework Planner by [facility]). Encourage the client to share his/her thoughts and feelings; express empathy, and build rapport while assessing primary cognitive, behavioral, interpersonal, or  other symptoms of the mood disorder. Assess presence, severity, and impact of past and present mood episodes including mania (i.e., pressured speech, impulsive behavior, euphoric mood, flight of ideas, reduced need for sleep, inflated self-esteem, and high energy) on social, occupational, and interpersonal functioning; supplement with semi-structured inventory, if desired (e.g., Young Mania Rating Scale; the Clinical Monitoring Form); re-administer as indicated to assess treatment response. Use cognitive therapy techniques to explore and educate the client's about cognitive biases that trigger his/her elevated or depressive mood (see Cognitive Therapy for Bipolar Disorder by Lizette dunker al.). Teach the client cognitive behavioral coping and relapse prevention skills including delaying impulsive actions, structured scheduling of daily activities, keeping a regular sleep routine, avoiding unrealistic goal striving, using relaxation  procedures, identifying and avoiding episode triggers such as stimulant drug use, alcohol consumption, breaking sleep routine, or exposing self to high stress (see Cognitive Therapy for Bipolar Disorder by Lizette dunker al.). Pledge to be there consistently to help, listen to, and support the client. Explore the client's fears of abandonment by sources of love and nurturance. Help the client differentiate between real and imagined, actual and exaggerated losses. Probe real or perceived losses in the client's life. Review ways for the client to replace the losses and put them in perspective. Probe the causes for the client's low self-esteem and abandonment fears in the family-of-origin history. Develop the client's family genogram and/or symptom line and help identify patterns of dysfunction within the family. Assign the client to write a forgiveness letter to the perpetrator of abuse (or assign Feelings and Forgiveness Letter in the Adult Psychotherapy Homework Planner by Jenniffer); process the letter. Teach the client the benefits (i.e., release of hurt and anger, putting issue in the past, opens door for trust of others, etc.) of beginning a process of forgiveness of (not necessarily forgetting or fraternizing with) abusive adults. Assist the client in clarifying his/her role within the family and his/her feelings connected to that role. Explore the client's painful childhood experiences (or assign Share the Painful Memory in the Adult Psychotherapy Homework Planner by Jenniffer). Support and encourage the client when he/she begins to express feelings of rage, sadness, fear, and rejection relating to family abuse or neglect. Assign the client to record feelings in a journal that describes memories, behavior, and emotions tied to his/her traumatic childhood experiences (or assign How the Trauma Affects Me in the Adult Psychotherapy Homework Planner by Jenniffer). Ask the client to compare his/her parenting  behavior to that of parent figures of his/her childhood; encourage the client to be aware of how easily we repeat patterns that we grew up with.   Diagnosis:MDD (major depressive disorder), recurrent episode, mild (HCC)  GAD (generalized anxiety disorder)  PTSD (post-traumatic stress disorder)  R/O Bipolar Disorder  Plan:  -meet biweekly per pt request. -meet again on Monday, June 21, 2024 at 1pm

## 2024-06-13 ENCOUNTER — Encounter: Payer: Self-pay | Admitting: Physician Assistant

## 2024-06-13 DIAGNOSIS — R7303 Prediabetes: Secondary | ICD-10-CM

## 2024-06-13 DIAGNOSIS — E6609 Other obesity due to excess calories: Secondary | ICD-10-CM

## 2024-06-13 DIAGNOSIS — E7800 Pure hypercholesterolemia, unspecified: Secondary | ICD-10-CM

## 2024-06-13 DIAGNOSIS — E781 Pure hyperglyceridemia: Secondary | ICD-10-CM

## 2024-06-14 ENCOUNTER — Ambulatory Visit: Admitting: Professional

## 2024-06-14 ENCOUNTER — Other Ambulatory Visit (HOSPITAL_BASED_OUTPATIENT_CLINIC_OR_DEPARTMENT_OTHER): Payer: Self-pay

## 2024-06-14 MED ORDER — WEGOVY 1.7 MG/0.75ML ~~LOC~~ SOAJ
1.7000 mg | SUBCUTANEOUS | 0 refills | Status: DC
  Filled 2024-06-14: qty 3, 28d supply, fill #0

## 2024-06-14 MED ORDER — WEGOVY 2.4 MG/0.75ML ~~LOC~~ SOAJ
2.4000 mg | SUBCUTANEOUS | 11 refills | Status: DC
  Filled 2024-06-14: qty 3, fill #0
  Filled 2024-07-09: qty 3, 28d supply, fill #0
  Filled 2024-08-04: qty 3, 28d supply, fill #1
  Filled 2024-09-01: qty 3, 28d supply, fill #2

## 2024-06-21 ENCOUNTER — Encounter: Payer: Self-pay | Admitting: Professional

## 2024-06-21 ENCOUNTER — Ambulatory Visit (INDEPENDENT_AMBULATORY_CARE_PROVIDER_SITE_OTHER): Admitting: Professional

## 2024-06-21 DIAGNOSIS — F4310 Post-traumatic stress disorder, unspecified: Secondary | ICD-10-CM | POA: Diagnosis not present

## 2024-06-21 DIAGNOSIS — F330 Major depressive disorder, recurrent, mild: Secondary | ICD-10-CM

## 2024-06-21 DIAGNOSIS — F411 Generalized anxiety disorder: Secondary | ICD-10-CM

## 2024-06-21 NOTE — Progress Notes (Signed)
 [facility] Counselor/Therapist Progress Note  Patient ID: Kelsey Vaughan, MRN: [id number],    Date: 06/21/2024  Time Spent:  54 minutes [id number]  Treatment Type: Individual therapy  Risk Assessment: Danger to Self:  No Self-injurious Behavior: No Danger to Others: No  Subjective: This session was held via video teletherapy. The patient consented to video teletherapy and was located at her home during this session. She is aware it is the responsibility of the patient to secure confidentiality on her end of the session. The provider was in a private home office for the duration of this session.    The patient arrived on time for her Caregility session.  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 -pt doesn't understand why it's so hard to try and get back into [employer] -she has applied 2 times and had interview with [employer] -she is not going to put a lot of energy into getting an interview with [employer] -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 hours per week 2-transportation -transmission went out in car on lunch break the week before last 3-personally -pt reports things are going good -she will answer the phone only when she wants to talk -she plans to take care of herself first -she is doing what's best for me -pt refuses to feel pressured by others -went to [place] on Friday and took aunt and mom to [place] for sweepstakes -she interacted with her mom and thinks the interaction was okay -pt keeps her  distance and will no longer let people get close to me anymore other than my son -pt very protective of who she allows to come in her life -I have my sister  Treatment Plan Problems: Bipolar Disorder - Mania, Childhood Trauma, Sleep Disturbance, Anger Control Problems Symptoms: Shows a pattern of episodic excessive anger in response to specific situations or situational themes. Shows direct or indirect evidence of physiological arousal related to anger. Reports a history of explosive, aggressive outbursts out of proportion with any precipitating stressors, leading to verbal attacks, assaultive acts, or destruction of property. Displays overreactive verbal hostility to insignificant irritants. Makes swift and harsh judgmental statements to or about others. Displays body language suggesting anger, including tense muscles (e.g., clenched fist or jaw), glaring looks, or refusal to make eye contact. Shows passive-aggressive patterns (e.g., social withdrawal, lack of complete or timely compliance in following directions or rules, complaining about authority figures behind their backs, uncooperative in meeting expected behavioral norms) due to anger. Passively withholds feelings and then explodes in a rage. Demonstrates an angry overreaction to perceived disapproval, rejection, or criticism. Uses abusive language meant to intimidate others. Rationalizes and blames others for aggressive and abusive behavior. Uses aggression as a means of achieving power and control. Exhibits an abnormally and persistently elevated, expansive, or irritable mood with at least three symptoms of mania (i.e., inflated self-esteem or grandiosity, decreased need for sleep, pressured speech, flight of ideas, distractibility, excessive goal-directed activity or psychomotor agitation, excessive involvement in pleasurable,  high-risk behavior). The elevated mood or irritability (mania) causes marked impairment in occupational  functioning, social activities, or relationships with others. Reports flight of ideas or thoughts racing. Verbalizes grandiose ideas and/or persecutory beliefs. Shows evidence of a decreased need for sleep. Reports little or no appetite. Exhibits increased motor activity or agitation. Displays a poor attention span and is easily distracted. Loss of normal inhibition leads to impulsive and excessive pleasure-oriented behavior without regard for painful consequences. Exhibits an expansive mood that can easily turn to impatience and irritable anger if goal-oriented behavior is blocked or confronted. Lacks follow-through in projects, even though energy is very high, since behavior lacks discipline and goal-directedness. Reports of childhood physical, sexual, and/or emotional abuse. Description of parents as physically or emotionally neglectful as they were chemically dependent, too busy, absent, etc. Description of childhood as chaotic as parent(s) was substance abuser (or mentally ill, antisocial, etc.), leading to frequent moves, multiple abusive spousal partners, frequent substitute caretakers, financial pressures, and/or many stepsiblings. Irrational fears, suppressed rage, low self-esteem, identity conflicts, depression, or anxious insecurity related to painful early life experiences. Complains of difficulty falling asleep. Complains of difficulty remaining asleep. Insomnia or hypersomnia complaints due to a reversal of the normal sleep-wake schedule. Goals: Learn and implement anger management skills to reduce the level of anger and irritability that accompanies it. Increase respectful communication through the use of assertiveness and conflict resolution skills. Develop an awareness of angry thoughts, feelings, and actions, clarifying origins of, and learning alternatives to aggressive anger. Decrease the frequency, intensity, and duration of angry thoughts, feelings, and actions and increase  the ability to recognize and respectfully express frustration and resolve conflict. Implement cognitive behavioral skills necessary to solve problems in a more constructive manner. Come to an awareness and acceptance of angry feelings while developing better control and more serenity. Become capable of handling angry feelings in constructive ways that enhance daily functioning. Demonstrate respect for others and their feelings. Alleviate manic/hypomanic mood and return to previous level of effective functioning. Normalize energy level and return to usual activities, good judgment, stable mood, more realistic expectations, and goal-directed behavior. Reduce agitation, impulsivity, and pressured speech while achieving sensitivity to the consequences of behavior and having more realistic expectations. Achieve controlled behavior, moderated mood, more deliberative speech and thought process, and a stable daily activity pattern. Develop healthy cognitive patterns and beliefs about self and the world that lead to alleviation and help prevent the relapse of manic/hypomanic episodes. Talk about underlying feelings of low self-esteem or guilt and fears of rejection, dependency, and abandonment. Develop an awareness of how childhood issues have affected and continue to affect one's family life. Resolve past childhood/family issues, leading to less anger and depression, greater self-esteem, security, and confidence. Release the emotions associated with past childhood/family issues, resulting in less resentment and more serenity. Let go of blame and begin to forgive others for pain caused in childhood. Restore restful sleep pattern. Feel refreshed and energetic during wakeful hours. Objectives target date for all objectives is 02/16/2025: Keep a daily journal of persons, situations, and other triggers of anger; record thoughts, feelings, and actions taken. Verbalize increased awareness of anger expression  patterns, their causes, and their consequences. Learn and implement calming and coping strategies as part of an overall approach to managing anger. Identify, challenge, and replace anger-inducing self-talk with self-talk that facilitates a less angry reaction. Learn and implement thought-stopping to manage intrusive unwanted thoughts that trigger anger. Verbalize an understanding of assertive communication and how it can  be used to express thoughts and feelings of anger in a controlled, respectful way. Describe mood state, energy level, amount of control over thoughts, and sleeping pattern. Identify and replace thoughts and behaviors that trigger manic or depressive symptoms. Differentiate between real and imagined losses, rejections, and abandonments. Verbalize grief, fear, and anger regarding real or imagined losses in life. Acknowledge the low self-esteem and fear of rejection that underlie the braggadocio. Describe what it was like to grow up in the home environment. Describe each family member and identify the role each played within the family. Identify patterns of abuse, neglect, or abandonment within the family of origin, both current and historical, nuclear and extended. Identify feelings associated with major traumatic incidents in childhood and with parental child-rearing patterns. Identify how own parenting has been influenced by childhood experiences. Identify the positive aspects for self of being able to forgive all those involved with the abuse. Describe the history and details of sleep pattern. Verbalize depressive or anxious feelings and share possible causes. Verbalize an understanding of normal sleep, sleep disturbances, and their treatment. Learn and implement calming skills for use at bedtime. Practice good sleep hygiene. Learn and implement stimulus control strategies to establish a consistent sleep-wake rhythm. Learn and implement a sleep restriction method to increase  sleep efficiency. Learn and implement skills for managing stresses contributing to the sleep problem. Interventions: Teach the client calming techniques (e.g., progressive muscle relaxation, breathing induced relaxation, calming imagery, cue-controlled relaxation, applied relaxation, mindful breathing) as part of a tailored strategy for reducing chronic and acute physiological tension that accompanies the escalation of his/her angry feelings. Explore the client's self-talk that mediates his/her angry feelings and actions (e.g., demanding expectations reflected in should, must, or have-to statements); identify and challenge biases, assisting him/her in generating appraisals and self-talk that corrects for the biases and facilitates a more flexible and temperate response to frustration. Combine new self-talk with calming skills as part of a set of coping skills to manage anger. Role-play the use of relaxation and cognitive coping to visualized anger-provoking scenes, moving from low- to high-anger scenes. Assign the implementation of calming techniques in his/her daily life and when facing anger-triggering situations; process the results, reinforcing success and problem-solving obstacles. Assign the client to implement a thought-stopping technique in which he/she shouts STOP to himself/herself in his/her mind and then replaces the thought with an alternative that is calming (or assign Making Use of the Thought-Stopping Technique in the Adult Psychotherapy Homework Planner by [facility]); review implantation, reinforcing success and providing corrective feedback for failure. Use instruction, modeling, and/or role-playing to teach the client the distinctive elements as well as the pros and cons of assertive, unassertive (passive), and aggressive communication. Ask the client to self-monitor, keeping a daily journal in which he/she documents persons, situations, thoughts, feelings, and actions associated with  moments of anger, irritation, or disappointment (or assign Anger Journal in the Adult Psychotherapy Homework Planner by Jenniffer); routinely process the journal toward helping the client understand his/her contributions to generating his/her anger. Assist the client in generating a list of anger triggers; process the list toward helping the client understand the causes and expressions of his/her anger. Assist the client in re-conceptualizing anger as involving different dimensions (cognitive, physiological, affective, and behavioral) that interact predictably (e.g., demanding expectations not being met leading to increased arousal and anger leading to acting out) and that can be understood, challenged, and changed. Process the client's list of anger triggers and other relevant journal information toward helping the client  understand how cognitive, physiological, and affective factors interplay to produce anger. Ask the client to list and discuss ways anger has negatively impacted his/her daily life (e.g., hurting others or self, legal conflicts, loss of respect from self and others, destruction of property); process this list. Assist the client in identifying the positive consequences of managing anger (e.g., respect from others and self, cooperation from others, improved physical health, etc.) (or assign Alternatives to Destructive Anger in the Adult Psychotherapy Homework Planner by Jenniffer). Assess the client's sleep history including sleep pattern, bedtime routine, activities associated  with the bed, activity level while awake, nutritional habits including stimulant use, napping practice, actual sleep time, rhythm of time for being awake versus sleeping, and so on. Teach the client stimulus control techniques (e.g., lie down to sleep only when sleepy; do not use the bed for activities like watching television, reading, listening to music, but only for sleep or sexual activity; get out of bed if sleep  doesn't arrive soon after retiring; lie back down when sleepy; set alarm to the same wake-up time every morning regardless of sleep time or quality; do not nap during the day); assign consistent implementation. Instruct the client to move activities associated with arousal and activation from the bedtime ritual to other times during the day (e.g., reading stimulating content, reviewing day's events, planning for next day, watching disturbing television). Use cognitive behavioral skills training techniques (e.g., instruction, covert modeling [i.e., imagining the successful use of the strategies], role-play, practice, and generalization training) to teach the client tailored skills (e.g., calming and coping skills, conflict-resolution, problem-solving) for managing stressors related to the sleep disturbance (e.g., interpersonal conflicts that carry over and cause nighttime wakefulness); routinely review, reinforce successes, problem-solve obstacles toward effective everyday use (see Insomnia: A Clinical Guide to Assessment and Treatment by Morin and Espie; Treating Sleep Disorders by Michaeleen Pay and Lichstein). Assess the role of depression or anxiety as the cause of the client's sleep disturbance (see the Unipolar Depression or Anxiety chapters in this Planner). Provide the client with basic sleep education (e.g., normal length of sleep, normal variations of sleep, normal time to fall asleep, and normal midnight awakening; recommend The Insomnia Workbook: A Comprehensive Guide to Getting the Sleep You Need by Silberman); help the client understand the exact nature of his/her abnormal sleeping pattern. Teach the client relaxation skills (e.g., progressive muscle relaxation, guided imagery, slow diaphragmatic breathing); teach the client how to apply these skills to facilitate relaxation and sleep at bedtime (see Bedtime Relaxation Techniques by Otto armin [employer]). Instruct the client in sleep hygiene  practices such as restricting excessive liquid intake, spicy late night snacks, or heavy evening meals; exercising regularly, but not within 3 - 4 hours of bedtime; minimizing or avoiding caffeine, alcohol, tobacco, and stimulant intake (or assign Sleep Pattern Record in the Adult Psychotherapy Homework Planner by [facility]). Encourage the client to share his/her thoughts and feelings; express empathy, and build rapport while assessing primary cognitive, behavioral, interpersonal, or other symptoms of the mood disorder. Assess presence, severity, and impact of past and present mood episodes including mania (i.e., pressured speech, impulsive behavior, euphoric mood, flight of ideas, reduced need for sleep, inflated self-esteem, and high energy) on social, occupational, and interpersonal functioning; supplement with semi-structured inventory, if desired (e.g., Young Mania Rating Scale; the Clinical Monitoring Form); re-administer as indicated to assess treatment response. Use cognitive therapy techniques to explore and educate the client's about cognitive biases that trigger his/her elevated or depressive mood (see Cognitive Therapy  for Bipolar Disorder by Lizette dunker al.). Teach the client cognitive behavioral coping and relapse prevention skills including delaying impulsive actions, structured scheduling of daily activities, keeping a regular sleep routine, avoiding unrealistic goal striving, using relaxation procedures, identifying and avoiding episode triggers such as stimulant drug use, alcohol consumption, breaking sleep routine, or exposing self to high stress (see Cognitive Therapy for Bipolar Disorder by Lizette dunker al.). Pledge to be there consistently to help, listen to, and support the client. Explore the client's fears of abandonment by sources of love and nurturance. Help the client differentiate between real and imagined, actual and exaggerated losses. Probe real or perceived losses in the client's  life. Review ways for the client to replace the losses and put them in perspective. Probe the causes for the client's low self-esteem and abandonment fears in the family-of-origin history. Develop the client's family genogram and/or symptom line and help identify patterns of dysfunction within the family. Assign the client to write a forgiveness letter to the perpetrator of abuse (or assign Feelings and Forgiveness Letter in the Adult Psychotherapy Homework Planner by Jenniffer); process the letter. Teach the client the benefits (i.e., release of hurt and anger, putting issue in the past, opens door for trust of others, etc.) of beginning a process of forgiveness of (not necessarily forgetting or fraternizing with) abusive adults. Assist the client in clarifying his/her role within the family and his/her feelings connected to that role. Explore the client's painful childhood experiences (or assign Share the Painful Memory in the Adult Psychotherapy Homework Planner by Jenniffer). Support and encourage the client when he/she begins to express feelings of rage, sadness, fear, and rejection relating to family abuse or neglect. Assign the client to record feelings in a journal that describes memories, behavior, and emotions tied to his/her traumatic childhood experiences (or assign How the Trauma Affects Me in the Adult Psychotherapy Homework Planner by Jenniffer). Ask the client to compare his/her parenting behavior to that of parent figures of his/her childhood; encourage the client to be aware of how easily we repeat patterns that we grew up with.   Diagnosis:MDD (major depressive disorder), recurrent episode, mild (HCC)  GAD (generalized anxiety disorder)  PTSD (post-traumatic stress disorder)  R/O Bipolar Disorder  Plan:   -meet again on Monday, July 05, 2024 at 1pm

## 2024-06-22 ENCOUNTER — Ambulatory Visit: Admitting: Physician Assistant

## 2024-06-28 ENCOUNTER — Ambulatory Visit: Admitting: Professional

## 2024-07-01 ENCOUNTER — Ambulatory Visit: Admitting: Sports Medicine

## 2024-07-01 ENCOUNTER — Encounter: Payer: Self-pay | Admitting: Sports Medicine

## 2024-07-01 ENCOUNTER — Other Ambulatory Visit (INDEPENDENT_AMBULATORY_CARE_PROVIDER_SITE_OTHER)

## 2024-07-01 DIAGNOSIS — M75102 Unspecified rotator cuff tear or rupture of left shoulder, not specified as traumatic: Secondary | ICD-10-CM

## 2024-07-01 DIAGNOSIS — M67814 Other specified disorders of tendon, left shoulder: Secondary | ICD-10-CM | POA: Diagnosis not present

## 2024-07-01 MED ORDER — TRIAMCINOLONE ACETONIDE 40 MG/ML IJ SUSP
40.0000 mg | Freq: Once | INTRAMUSCULAR | Status: AC
  Administered 2024-07-01: 40 mg via INTRAMUSCULAR

## 2024-07-01 NOTE — Assessment & Plan Note (Signed)
 Pleasant 48 year old female, history of right subacromial decompression surgery and distal clavicular excision by Dr. Dozier back in 2021. She has also had left shoulder arthroscopy in the past. Since then she has had increasing discomfort left shoulder, localized over the deltoid, she had positive impingement signs on exam, at the last visit we did a subacromial injection, she did really well, she had done some home physical therapy. Unfortunately she developed a recurrence of pain about a month ago. We obtained an MRI that did show mostly rotator cuff tendinosis as well as acromioclavicular osteoarthritis. Due to persistence of symptoms we will proceed with repeat subacromial injection and I would like to go and get her teed up with Dr. Dozier again.

## 2024-07-01 NOTE — Progress Notes (Signed)
    Procedures performed today:    Procedure: Real-time Ultrasound Guided injection of the left subacromial bursa Device: Samsung HS60  Verbal informed consent obtained.  Time-out conducted.  Noted no overlying erythema, induration, or other signs of local infection.  Skin prepped in a sterile fashion.  Local anesthesia: Topical Ethyl chloride.  With sterile technique and under real time ultrasound guidance: Mild bursitis/cuff tendinosis noted, 1 cc Kenalog  40, 1 cc lidocaine ,1 cc bupivacaine  injected easily Completed without difficulty  Advised to call if fevers/chills, erythema, induration, drainage, or persistent bleeding.  Images permanently stored and available for review in PACS.  Impression: Technically successful ultrasound guided injection.  Independent interpretation of notes and tests performed by another provider:   None.  Brief History, Exam, Impression, and Recommendations:    Tendinosis of left rotator cuff Pleasant 48 year old female, history of right subacromial decompression surgery and distal clavicular excision by Dr. Dozier back in 2021. She has also had left shoulder arthroscopy in the past. Since then she has had increasing discomfort left shoulder, localized over the deltoid, she had positive impingement signs on exam, at the last visit we did a subacromial injection, she did really well, she had done some home physical therapy. Unfortunately she developed a recurrence of pain about a month ago. We obtained an MRI that did show mostly rotator cuff tendinosis as well as acromioclavicular osteoarthritis. Due to persistence of symptoms we will proceed with repeat subacromial injection and I would like to go and get her teed up with Dr. Dozier again.    ____________________________________________ Kelsey PARAS. Vaughan, M.D., ABFM., CAQSM., AME. Primary Care and Sports Medicine [facility]  Adjunct Professor of [facility]   [employer]  Restaurant manager, fast food

## 2024-07-02 ENCOUNTER — Ambulatory Visit: Admitting: Sports Medicine

## 2024-07-05 ENCOUNTER — Encounter: Payer: Self-pay | Admitting: Professional

## 2024-07-05 ENCOUNTER — Ambulatory Visit: Admitting: Professional

## 2024-07-05 DIAGNOSIS — F4310 Post-traumatic stress disorder, unspecified: Secondary | ICD-10-CM | POA: Diagnosis not present

## 2024-07-05 DIAGNOSIS — F330 Major depressive disorder, recurrent, mild: Secondary | ICD-10-CM

## 2024-07-05 DIAGNOSIS — F411 Generalized anxiety disorder: Secondary | ICD-10-CM | POA: Diagnosis not present

## 2024-07-05 DIAGNOSIS — F3163 Bipolar disorder, current episode mixed, severe, without psychotic features: Secondary | ICD-10-CM | POA: Diagnosis not present

## 2024-07-05 NOTE — Progress Notes (Addendum)
 [facility] Counselor/Therapist Progress Note  Patient ID: Kelsey Vaughan, MRN: [id number],    Date: 07/05/2024  Time Spent:  31 minutes 103-134pm  Treatment Type: Individual therapy  Risk Assessment: Danger to Self:  No Self-injurious Behavior: No Danger to Others: No  Subjective: This session was held via video teletherapy. The patient consented to video teletherapy and was located at her home during this session. She is aware it is the responsibility of the patient to secure confidentiality on her end of the session. The provider was in a private home office for the duration of this session.    The patient arrived late for her Caregility session.  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   -her first interview was [facility] practice and feels strongly about that position, she is waiting to here   -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 [employer]   -she was able to share reference letters and the hiring manager apparently spoke to people about her -pt continues to be  frustrated at work and will remak that she is going into thehell hole -suggested pt consider how she is helping someone else -she did hear from her older coworker that she has been helpful to her 2-family -things are not going the greatest -she did briefly talk with her grandmother a few times last week -she had not talked to her in several years -her grandmother helped raise her and spoke respectfully -her grandmother built a wedge between her and her daughter when she said she the pt  gave her daughter to her   -her grandmother said pt gave her away instead of admitting that she took her and did not give her the chance that she could be a good mother -she did not go to the family dinner because she did not want to be in that environment   -she sees her daughter only at family events -pt has a family reunion on August 9th and pt plans to attend -her relationship with her daughter is very difficult -pt admits she's stubborn and her daughter is the same -pt sees herself as closing chapters and letting it go -pt wants peace in her life and is okay with to having people in her life      Treatment Plan Problems: Bipolar Disorder - Mania, Childhood Trauma, Sleep Disturbance, Anger Control Problems Symptoms: Shows a pattern of episodic excessive anger in response to specific situations or situational themes. Shows direct or indirect evidence of physiological arousal related to anger. Reports a history of explosive, aggressive outbursts out of proportion with any precipitating stressors, leading to verbal attacks, assaultive acts, or destruction of property. Displays overreactive verbal hostility to insignificant irritants. Makes swift and harsh judgmental statements to or about others. Displays body language suggesting anger, including tense muscles (e.g., clenched fist or jaw), glaring looks, or refusal to make eye contact. Shows passive-aggressive patterns (e.g., social withdrawal, lack of complete or timely compliance in following directions or rules, complaining about authority figures behind their backs, uncooperative in meeting expected behavioral norms)  due to anger. Passively withholds feelings and then explodes in a rage. Demonstrates an angry overreaction to perceived disapproval, rejection, or criticism. Uses abusive language meant to intimidate others. Rationalizes and blames others for aggressive and abusive behavior. Uses aggression as a means of achieving power  and control. Exhibits an abnormally and persistently elevated, expansive, or irritable mood with at least three symptoms of mania (i.e., inflated self-esteem or grandiosity, decreased need for sleep, pressured speech, flight of ideas, distractibility, excessive goal-directed activity or psychomotor agitation, excessive involvement in pleasurable, high-risk behavior). The elevated mood or irritability (mania) causes marked impairment in occupational functioning, social activities, or relationships with others. Reports flight of ideas or thoughts racing. Verbalizes grandiose ideas and/or persecutory beliefs. Shows evidence of a decreased need for sleep. Reports little or no appetite. Exhibits increased motor activity or agitation. Displays a poor attention span and is easily distracted. Loss of normal inhibition leads to impulsive and excessive pleasure-oriented behavior without regard for painful consequences. Exhibits an expansive mood that can easily turn to impatience and irritable anger if goal-oriented behavior is blocked or confronted. Lacks follow-through in projects, even though energy is very high, since behavior lacks discipline and goal-directedness. Reports of childhood physical, sexual, and/or emotional abuse. Description of parents as physically or emotionally neglectful as they were chemically dependent, too busy, absent, etc. Description of childhood as chaotic as parent(s) was substance abuser (or mentally ill, antisocial, etc.), leading to frequent moves, multiple abusive spousal partners, frequent substitute caretakers, financial pressures, and/or many stepsiblings. Irrational fears, suppressed rage, low self-esteem, identity conflicts, depression, or anxious insecurity related to painful early life experiences. Complains of difficulty falling asleep. Complains of difficulty remaining asleep. Insomnia or hypersomnia complaints due to a reversal of the normal sleep-wake  schedule. Goals: Learn and implement anger management skills to reduce the level of anger and irritability that accompanies it. Increase respectful communication through the use of assertiveness and conflict resolution skills. Develop an awareness of angry thoughts, feelings, and actions, clarifying origins of, and learning alternatives to aggressive anger. Decrease the frequency, intensity, and duration of angry thoughts, feelings, and actions and increase the ability to recognize and respectfully express frustration and resolve conflict. Implement cognitive behavioral skills necessary to solve problems in a more constructive manner. Come to an awareness and acceptance of angry feelings while developing better control and more serenity. Become capable of handling angry feelings in constructive ways that enhance daily functioning. Demonstrate respect for others and their feelings. Alleviate manic/hypomanic mood and return to previous level of effective functioning. Normalize energy level and return to usual activities, good judgment, stable mood, more realistic expectations, and goal-directed behavior. Reduce agitation, impulsivity, and pressured speech while achieving sensitivity to the consequences of behavior and having more realistic expectations. Achieve controlled behavior, moderated mood, more deliberative speech and thought process, and a stable daily activity pattern. Develop healthy cognitive patterns and beliefs about self and the world that lead to alleviation and help prevent the relapse of manic/hypomanic episodes. Talk about underlying feelings of low self-esteem or guilt and fears of rejection, dependency, and abandonment. Develop an awareness of how childhood issues have affected and continue to affect one's family life. Resolve past childhood/family issues, leading to less anger and depression, greater self-esteem, security, and confidence. Release the emotions associated with  past childhood/family issues, resulting in less resentment and more serenity. Let go of blame and begin to forgive others for pain caused in childhood. Restore restful sleep pattern. Feel refreshed and energetic during wakeful hours.  Objectives target date for all objectives is 02/16/2025: Keep a daily journal of persons, situations, and other triggers of anger; record thoughts, feelings, and actions taken. Verbalize increased awareness of anger expression patterns, their causes, and their consequences. Learn and implement calming and coping strategies as part of an overall approach to managing anger. Identify, challenge, and replace anger-inducing self-talk with self-talk that facilitates a less angry reaction. Learn and implement thought-stopping to manage intrusive unwanted thoughts that trigger anger. Verbalize an understanding of assertive communication and how it can be used to express thoughts and feelings of anger in a controlled, respectful way. Describe mood state, energy level, amount of control over thoughts, and sleeping pattern. Identify and replace thoughts and behaviors that trigger manic or depressive symptoms. Differentiate between real and imagined losses, rejections, and abandonments. Verbalize grief, fear, and anger regarding real or imagined losses in life. Acknowledge the low self-esteem and fear of rejection that underlie the braggadocio. Describe what it was like to grow up in the home environment. Describe each family member and identify the role each played within the family. Identify patterns of abuse, neglect, or abandonment within the family of origin, both current and historical, nuclear and extended. Identify feelings associated with major traumatic incidents in childhood and with parental child-rearing patterns. Identify how own parenting has been influenced by childhood experiences. Identify the positive aspects for self of being able to forgive all those involved  with the abuse. Describe the history and details of sleep pattern. Verbalize depressive or anxious feelings and share possible causes. Verbalize an understanding of normal sleep, sleep disturbances, and their treatment. Learn and implement calming skills for use at bedtime. Practice good sleep hygiene. Learn and implement stimulus control strategies to establish a consistent sleep-wake rhythm. Learn and implement a sleep restriction method to increase sleep efficiency. Learn and implement skills for managing stresses contributing to the sleep problem. Interventions: Teach the client calming techniques (e.g., progressive muscle relaxation, breathing induced relaxation, calming imagery, cue-controlled relaxation, applied relaxation, mindful breathing) as part of a tailored strategy for reducing chronic and acute physiological tension that accompanies the escalation of his/her angry feelings. Explore the client's self-talk that mediates his/her angry feelings and actions (e.g., demanding expectations reflected in should, must, or have-to statements); identify and challenge biases, assisting him/her in generating appraisals and self-talk that corrects for the biases and facilitates a more flexible and temperate response to frustration. Combine new self-talk with calming skills as part of a set of coping skills to manage anger. Role-play the use of relaxation and cognitive coping to visualized anger-provoking scenes, moving from low- to high-anger scenes. Assign the implementation of calming techniques in his/her daily life and when facing anger-triggering situations; process the results, reinforcing success and problem-solving obstacles. Assign the client to implement a thought-stopping technique in which he/she shouts STOP to himself/herself in his/her mind and then replaces the thought with an alternative that is calming (or assign Making Use of the Thought-Stopping Technique in the Adult  Psychotherapy Homework Planner by [facility]); review implantation, reinforcing success and providing corrective feedback for failure. Use instruction, modeling, and/or role-playing to teach the client the distinctive elements as well as the pros and cons of assertive, unassertive (passive), and aggressive communication. Ask the client to self-monitor, keeping a daily journal in which he/she documents persons, situations, thoughts, feelings, and actions associated with moments of anger, irritation, or disappointment (or assign Anger Journal in the Adult Psychotherapy Homework Planner by Jenniffer); routinely process the journal toward helping the client  understand his/her contributions to generating his/her anger. Assist the client in generating a list of anger triggers; process the list toward helping the client understand the causes and expressions of his/her anger. Assist the client in re-conceptualizing anger as involving different dimensions (cognitive, physiological, affective, and behavioral) that interact predictably (e.g., demanding expectations not being met leading to increased arousal and anger leading to acting out) and that can be understood, challenged, and changed. Process the client's list of anger triggers and other relevant journal information toward helping the client understand how cognitive, physiological, and affective factors interplay to produce anger. Ask the client to list and discuss ways anger has negatively impacted his/her daily life (e.g., hurting others or self, legal conflicts, loss of respect from self and others, destruction of property); process this list. Assist the client in identifying the positive consequences of managing anger (e.g., respect from others and self, cooperation from others, improved physical health, etc.) (or assign Alternatives to Destructive Anger in the Adult Psychotherapy Homework Planner by Jenniffer). Assess the client's sleep history including sleep  pattern, bedtime routine, activities associated  with the bed, activity level while awake, nutritional habits including stimulant use, napping practice, actual sleep time, rhythm of time for being awake versus sleeping, and so on. Teach the client stimulus control techniques (e.g., lie down to sleep only when sleepy; do not use the bed for activities like watching television, reading, listening to music, but only for sleep or sexual activity; get out of bed if sleep doesn't arrive soon after retiring; lie back down when sleepy; set alarm to the same wake-up time every morning regardless of sleep time or quality; do not nap during the day); assign consistent implementation. Instruct the client to move activities associated with arousal and activation from the bedtime ritual to other times during the day (e.g., reading stimulating content, reviewing day's events, planning for next day, watching disturbing television). Use cognitive behavioral skills training techniques (e.g., instruction, covert modeling [i.e., imagining the successful use of the strategies], role-play, practice, and generalization training) to teach the client tailored skills (e.g., calming and coping skills, conflict-resolution, problem-solving) for managing stressors related to the sleep disturbance (e.g., interpersonal conflicts that carry over and cause nighttime wakefulness); routinely review, reinforce successes, problem-solve obstacles toward effective everyday use (see Insomnia: A Clinical Guide to Assessment and Treatment by Morin and Espie; Treating Sleep Disorders by Michaeleen Pay and Lichstein). Assess the role of depression or anxiety as the cause of the client's sleep disturbance (see the Unipolar Depression or Anxiety chapters in this Planner). Provide the client with basic sleep education (e.g., normal length of sleep, normal variations of sleep, normal time to fall asleep, and normal midnight awakening; recommend The Insomnia  Workbook: A Comprehensive Guide to Getting the Sleep You Need by Silberman); help the client understand the exact nature of his/her abnormal sleeping pattern. Teach the client relaxation skills (e.g., progressive muscle relaxation, guided imagery, slow diaphragmatic breathing); teach the client how to apply these skills to facilitate relaxation and sleep at bedtime (see Bedtime Relaxation Techniques by Otto armin Carbon). Instruct the client in sleep hygiene practices such as restricting excessive liquid intake, spicy late night snacks, or heavy evening meals; exercising regularly, but not within 3 - 4 hours of bedtime; minimizing or avoiding caffeine, alcohol, tobacco, and stimulant intake (or assign Sleep Pattern Record in the Adult Psychotherapy Homework Planner by [facility]). Encourage the client to share his/her thoughts and feelings; express empathy, and build rapport while assessing primary cognitive, behavioral, interpersonal,  or other symptoms of the mood disorder. Assess presence, severity, and impact of past and present mood episodes including mania (i.e., pressured speech, impulsive behavior, euphoric mood, flight of ideas, reduced need for sleep, inflated self-esteem, and high energy) on social, occupational, and interpersonal functioning; supplement with semi-structured inventory, if desired (e.g., Young Mania Rating Scale; the Clinical Monitoring Form); re-administer as indicated to assess treatment response. Use cognitive therapy techniques to explore and educate the client's about cognitive biases that trigger his/her elevated or depressive mood (see Cognitive Therapy for Bipolar Disorder by Lizette dunker al.). Teach the client cognitive behavioral coping and relapse prevention skills including delaying impulsive actions, structured scheduling of daily activities, keeping a regular sleep routine, avoiding unrealistic goal striving, using relaxation procedures, identifying and avoiding episode  triggers such as stimulant drug use, alcohol consumption, breaking sleep routine, or exposing self to high stress (see Cognitive Therapy for Bipolar Disorder by Lizette dunker al.). Pledge to be there consistently to help, listen to, and support the client. Explore the client's fears of abandonment by sources of love and nurturance. Help the client differentiate between real and imagined, actual and exaggerated losses. Probe real or perceived losses in the client's life. Review ways for the client to replace the losses and put them in perspective. Probe the causes for the client's low self-esteem and abandonment fears in the family-of-origin history. Develop the client's family genogram and/or symptom line and help identify patterns of dysfunction within the family. Assign the client to write a forgiveness letter to the perpetrator of abuse (or assign Feelings and Forgiveness Letter in the Adult Psychotherapy Homework Planner by Jenniffer); process the letter. Teach the client the benefits (i.e., release of hurt and anger, putting issue in the past, opens door for trust of others, etc.) of beginning a process of forgiveness of (not necessarily forgetting or fraternizing with) abusive adults. Assist the client in clarifying his/her role within the family and his/her feelings connected to that role. Explore the client's painful childhood experiences (or assign Share the Painful Memory in the Adult Psychotherapy Homework Planner by Jenniffer). Support and encourage the client when he/she begins to express feelings of rage, sadness, fear, and rejection relating to family abuse or neglect. Assign the client to record feelings in a journal that describes memories, behavior, and emotions tied to his/her traumatic childhood experiences (or assign How the Trauma Affects Me in the Adult Psychotherapy Homework Planner by Jenniffer). Ask the client to compare his/her parenting behavior to that of parent figures of his/her  childhood; encourage the client to be aware of how easily we repeat patterns that we grew up with.   Diagnosis:Bipolar 1 disorder, mixed, severe (HCC)  MDD (major depressive disorder), recurrent episode, mild (HCC)  GAD (generalized anxiety disorder)  PTSD (post-traumatic stress disorder)  R/O Bipolar Disorder  Plan:  -pt wants to focus on herself and her needs -pt wants to avoid negative people -meet again on Monday, July 19, 2024 at 1pm

## 2024-07-09 ENCOUNTER — Other Ambulatory Visit (HOSPITAL_BASED_OUTPATIENT_CLINIC_OR_DEPARTMENT_OTHER): Payer: Self-pay

## 2024-07-12 ENCOUNTER — Ambulatory Visit: Admitting: Professional

## 2024-07-16 ENCOUNTER — Ambulatory Visit: Payer: Self-pay

## 2024-07-16 ENCOUNTER — Other Ambulatory Visit: Payer: Self-pay

## 2024-07-16 ENCOUNTER — Encounter (HOSPITAL_BASED_OUTPATIENT_CLINIC_OR_DEPARTMENT_OTHER): Payer: Self-pay

## 2024-07-16 ENCOUNTER — Emergency Department (HOSPITAL_BASED_OUTPATIENT_CLINIC_OR_DEPARTMENT_OTHER)

## 2024-07-16 ENCOUNTER — Emergency Department (HOSPITAL_BASED_OUTPATIENT_CLINIC_OR_DEPARTMENT_OTHER)
Admission: EM | Admit: 2024-07-16 | Discharge: 2024-07-16 | Disposition: A | Discharge status: Home / Self Care | Attending: Emergency Medicine | Admitting: Emergency Medicine

## 2024-07-16 ENCOUNTER — Encounter: Payer: Self-pay | Admitting: Physician Assistant

## 2024-07-16 DIAGNOSIS — R55 Syncope and collapse: Secondary | ICD-10-CM | POA: Diagnosis not present

## 2024-07-16 DIAGNOSIS — Z91040 Latex allergy status: Secondary | ICD-10-CM | POA: Diagnosis not present

## 2024-07-16 DIAGNOSIS — R29818 Other symptoms and signs involving the nervous system: Secondary | ICD-10-CM | POA: Diagnosis not present

## 2024-07-16 DIAGNOSIS — R002 Palpitations: Secondary | ICD-10-CM | POA: Insufficient documentation

## 2024-07-16 DIAGNOSIS — Z7982 Long term (current) use of aspirin: Secondary | ICD-10-CM | POA: Diagnosis not present

## 2024-07-16 DIAGNOSIS — H43399 Other vitreous opacities, unspecified eye: Secondary | ICD-10-CM | POA: Diagnosis not present

## 2024-07-16 DIAGNOSIS — R000 Tachycardia, unspecified: Secondary | ICD-10-CM | POA: Diagnosis not present

## 2024-07-16 DIAGNOSIS — R519 Headache, unspecified: Secondary | ICD-10-CM | POA: Diagnosis not present

## 2024-07-16 LAB — COMPREHENSIVE METABOLIC PANEL WITH GFR
ALT: 12 U/L (ref 0–44)
AST: 14 U/L — ABNORMAL LOW (ref 15–41)
Albumin: 4.1 g/dL (ref 3.5–5.0)
Alkaline Phosphatase: 73 U/L (ref 38–126)
Anion gap: 11 (ref 5–15)
BUN: 14 mg/dL (ref 6–20)
CO2: 25 mmol/L (ref 22–32)
Calcium: 9.2 mg/dL (ref 8.9–10.3)
Chloride: 106 mmol/L (ref 98–111)
Creatinine, Ser: 1.03 mg/dL — ABNORMAL HIGH (ref 0.44–1.00)
GFR, Estimated: >60 mL/min (ref >60)
Glucose, Bld: 100 mg/dL — ABNORMAL HIGH (ref 70–99)
Potassium: 4.2 mmol/L (ref 3.5–5.1)
Sodium: 142 mmol/L (ref 135–145)
Total Bilirubin: 0.5 mg/dL (ref 0.0–1.2)
Total Protein: 7.5 g/dL (ref 6.5–8.1)

## 2024-07-16 LAB — CBC
HCT: 42.4 % (ref 36.0–46.0)
Hemoglobin: 14.2 g/dL (ref 12.0–15.0)
MCH: 29 pg (ref 26.0–34.0)
MCHC: 33.5 g/dL (ref 30.0–36.0)
MCV: 86.7 fL (ref 80.0–100.0)
Platelets: 354 K/uL (ref 150–400)
RBC: 4.89 MIL/uL (ref 3.87–5.11)
RDW: 16.3 % — ABNORMAL HIGH (ref 11.5–15.5)
WBC: 12.9 K/uL — ABNORMAL HIGH (ref 4.0–10.5)
nRBC: 0 % (ref 0.0–0.2)

## 2024-07-16 LAB — TROPONIN T, HIGH SENSITIVITY
Troponin T High Sensitivity: <15 ng/L (ref <19)
Troponin T High Sensitivity: <15 ng/L (ref <19)

## 2024-07-16 LAB — PREGNANCY, URINE: Preg Test, Ur: NEGATIVE

## 2024-07-16 LAB — TSH: TSH: 1.13 u[IU]/mL (ref 0.350–4.500)

## 2024-07-16 LAB — D-DIMER, QUANTITATIVE: D-Dimer, Quant: 0.27 ug{FEU}/mL (ref 0.00–0.50)

## 2024-07-16 LAB — MAGNESIUM: Magnesium: 2.2 mg/dL (ref 1.7–2.4)

## 2024-07-16 NOTE — Telephone Encounter (Signed)
 FYI Only or Action Required?: FYI only for provider.  Patient was last seen in primary care on 07/01/2024 by Curtis Debby PARAS, MD.  Called Nurse Triage reporting Palpitations.  Symptoms began yesterday.  Interventions attempted: Rest, hydration, or home remedies.  Symptoms are: unchanged.  Triage Disposition: Go to ED Now (or PCP Triage)  Patient/caregiver understands and will follow disposition?: Yes  Copied from CRM [id number]. Topic: Clinical - Red Word Triage >> Jul 16, 2024  9:39 AM Kelsey Vaughan wrote: Red Word that prompted transfer to Nurse Triage: Patient states she already spoke with nurse in clinic via MyChart. States she's having irregular heart beats, heart pounding, and white flashes within eyesight. Tried getting an appt with Dr. Antoniette but she has nothing until August 19th and patient already has upcoing appt for August 13th. Reason for Disposition  Patient sounds very sick or weak to the triager  Answer Assessment - Initial Assessment Questions 1. DESCRIPTION: Please describe your heart rate or heartbeat that you are having (e.g., fast/slow, regular/irregular, skipped or extra beats, palpitations)     Irregular heartbeats-heart pounding 2. ONSET: When did it start? (e.g., minutes, hours, days)      Started within the last couple of days 3. DURATION: How long does it last (e.g., seconds, minutes, hours)     10 minutes or less 4. PATTERN Does it come and go, or has it been constant since it started?  Does it get worse with exertion?   Are you feeling it now?     Comes and goes 6. HEART RATE: Can you tell me your heart rate? How many beats in 15 seconds?  Note: Not all patients can do this.       Current heart rate while on the phone is 104 7. RECURRENT SYMPTOM: Have you ever had this before? If Yes, ask: When was the last time? and What happened that time?      yes 8. CAUSE: What do you think is causing the palpitations?     unsure 9.  CARDIAC HISTORY: Do you have any history of heart disease? (e.g., heart attack, angina, bypass surgery, angioplasty, arrhythmia)      Hx of irregular heart beat, hx of heart disease on father's family.  10. OTHER SYMPTOMS: Do you have any other symptoms? (e.g., dizziness, chest pain, sweating, difficulty breathing)       Patient reports having white flashes within eyesight-going on for a couple of days. Reports having a hot sensation to her right foot.   Patient recommended to Emergency Department due to symptoms. Patient verbalized understanding and all questions answered.  Protocols used: Heart Rate and Heartbeat Questions-A-AH

## 2024-07-16 NOTE — Discharge Instructions (Addendum)
 ### Understanding Sinus Tachycardia     You recently visited the emergency room because you experienced a racing heart (palpitations), felt lightheaded, and almost fainted. You also had a mild headache and noticed spots in your vision. The doctors performed several tests, including an electrocardiogram (ECG), blood work, and a head CT scan. Here is what these results mean and what to expect next.      **What Is Sinus Tachycardia?**      Sinus tachycardia means your heart is beating faster than normal, but the rhythm is regular and starts from the heart's natural pacemaker (the sinus node). This is different from dangerous heart rhythms (arrhythmias). Sinus tachycardia can happen for many reasons, including stress, anxiety, dehydration, fever, pain, or sometimes without a clear cause.[1][2]      **What Did the Tests Show?**      - **ECG:** Showed sinus tachycardia (a fast but regular heartbeat).      - **Blood tests:** Checked for heart damage (troponin) and blood clots in the lungs (D-dimer). Both were normal, which is reassuring.      - **Head CT scan:** Was normal, so there was no sign of bleeding or stroke.      - **Other labs:** Were normal, ruling out common causes like thyroid  problems or anemia.[3][1][2]      **What Could Have Caused These Symptoms?**      Common causes of sinus tachycardia include:      - Physical or emotional stress      - Dehydration (not enough fluids)      - Fever or infection      - Pain      - Stimulants (like caffeine or certain medications)      - Sometimes, the cause is not found, and the heart just beats faster than usual for a while.[1][2]      Rarely, some people have a condition called inappropriate sinus tachycardia, where the heart beats fast without a clear reason. This is usually not dangerous but can be uncomfortable.[4][3]      **What About the Headache and Vision Changes?**      A mild headache and seeing spots can happen when  blood flow to the brain is briefly reduced, such as when the heart is racing or if you almost faint. Since your head CT was normal and your symptoms improved, there is no sign of a serious problem.[5]      **What Should You Do Next?**      - **Follow up with your primary care provider and cardiologist.** They may want to do more tests, such as a heart monitor, to check your heart rhythm over a longer period.[3][1]      - **Stay hydrated.** Drink plenty of fluids unless you have been told otherwise.      - **Avoid stimulants.** Limit caffeine and energy drinks.      - **Rest and avoid overexertion** until you are cleared by your doctor.      - **Keep a symptom diary.** Write down when you feel your heart racing, what you were doing, and how you felt.      **When to Seek Immediate Help**      Go to the emergency room if you:      - Faint or lose consciousness      - Have chest pain or pressure      - Have trouble breathing      - Notice weakness or numbness in your face, arms, or legs      -  Have trouble speaking or understanding speech      **What Is the Outlook?**      Most people with sinus tachycardia recover well, especially when serious causes have been ruled out. Your doctors will continue to look for any underlying issues and help you manage symptoms if they continue.[4][3][1]      **Questions?**      Write down any questions you have and bring them to your next appointment. Your care team is here to help.      [4][3][1][5][2]      ### References  1. Sinus Tachycardia: A Multidisciplinary Expert Focused Review. Mayuga KA, Fedorowski DELENA Marcy FALCON, et al. Circulation. Arrhythmia and Electrophysiology. 2022;15(9):e007960. doi:10.1161/CIRCEP.121.007960. 2. Does This Patient With Palpitations Have a Cardiac Arrhythmia?. Thavendiranathan P, Bagai A, Khoo C, Dorian P, Choudhry NK. JAMA. 2009;302(19):2135-43. doi:10.1001/jama.7990.8326. 3. 2015 Heart Rhythm Society Expert  Consensus Statement on the Diagnosis and Treatment of Postural Tachycardia Syndrome, Inappropriate Sinus Tachycardia, and Vasovagal Syncope. Roselyn MINES, Grubb BP, Olshansky B, et al. Heart Rhythm. 2015;12(6):e41-63. doi:10.1016/j.hrthm.2015.03.029. 4. Inappropriate Sinus Tachycardia: Etiology, Pathophysiology, And Management: JACC Review Topic of the Week. Ahmed A, Pothineni NVK, Charate R, et al. Journal of the [employer] of Cardiology. 2022;79(24):2450-2462. doi:10.1016/j.jacc.2022.04.019. 5. 2017 ACC/AHA/HRS Guideline for the Evaluation and Management of Patients With Syncope: A Report of the [employer] of Cardiology/American Heart Association Task Force on Clinical Practice Guidelines and the Heart Rhythm Society. Debora ALLEGRA, Roselyn MINES, Benditt DG, et al. Journal of the [employer] of Cardiology. 2017;70(5):e39-e110. doi:10.1016/j.jacc.2017.03.003.

## 2024-07-16 NOTE — ED Provider Notes (Signed)
 [place] [facility] AT [facility] Provider Note   CSN: [id number] Arrival date & time: 07/16/24  1035     Patient presents with: Palpitations   Kelsey Vaughan is a 48 y.o. female who presents the emergency department with chief complaint of racing heart palpitations and near syncope.  She has a past medical history of irregular heartbeat.  She also has a strong family history of coronary artery disease on her father side who died in his 29s of massive heart attack.  She reports that she has a history of palpitations.  She takes Bystolic  for this.  She has not taken any in the past several days.  She states that is because she has been having intermittent palpitations and figured it was not working well anyway.  She reports that yesterday she had significant palpitations.  Today she was talking with a friend when she had onset of palpitations and began to feel lightheaded with spots in front of her eyes and racing in her heart and palpitations along with pressure on her chest.  She felt diaphoretic.  She excused herself from the comp of her station and went to the bathroom.  She notes that at that time her watch went off warning her that her heart rate was too high and she noted a heart rate of 134.  She states this lasted for several minutes and then resolved.  She denies any current chest pain palpitations or pressure.  She does state that she has a mild right sided aching headache.  She has been having episodes of seeing white flashes before her right eye.  She describes them as tiny little white spots that come and go.  She does have a history of migraine headaches for which she gets vertigo, blurry vision, nausea, photosensitivity.  She states that her headache is mild at this time does not feel like her typical migraines.  She denies unilateral weakness, changes in speech, nausea vomiting, vertigo or changes in ambulation, difficulty swallowing.  She denies unilateral leg  swelling.  Patient is on oral contraceptive pills.  She has no previous history of DVT or PE.    Palpitations      Prior to Admission medications   Medication Sig Start Date End Date Taking? Authorizing Provider  aspirin  EC 81 MG tablet Take 1 tablet (81 mg total) by mouth daily. 02/16/20   Goodrich, Callie E, PA-C  desogestrel -ethinyl estradiol  (CYRED EQ ) 0.15-30 MG-MCG tablet Take 1 tablet by mouth daily. 05/28/24   Rasch, Delon I, NP  Drospirenone  (SLYND ) 4 MG TABS Take 1 tablet (4 mg total) by mouth daily at 6 (six) AM. 03/09/24   Breeback, Jade L, PA-C  DULoxetine  (CYMBALTA ) 30 MG capsule Take 1 capsule (30 mg total) by mouth 2 (two) times daily. 03/09/24   Breeback, Jade L, PA-C  levocetirizine (XYZAL ) 5 MG tablet Take 1 tablet (5 mg total) by mouth every evening. 07/17/20   Breeback, Jade L, PA-C  mometasone  (NASONEX ) 50 MCG/ACT nasal spray One spray in each nostril twice a day, use left hand for right nostril, and right hand for left nostril.  Please dispense one bottle. 07/17/20   Breeback, Jade L, PA-C  nebivolol  (BYSTOLIC ) 5 MG tablet Take 1.5 tablets (7.5 mg total) by mouth 2 (two) times daily. 10/28/23   Burnard Debby LABOR, MD  rosuvastatin  (CRESTOR ) 10 MG tablet Take 1 tablet (10 mg total) by mouth daily. Patient not taking: Reported on 01/27/2024 10/28/23 03/31/24  Burnard Debby LABOR,  MD  SUMAtriptan  (IMITREX ) 100 MG tablet TAKE 1 TABLET (100 MG TOTAL) BY MOUTH ONCE AS NEEDED FOR UP TO 1 DOSE FOR MIGRAINE. MAY REPEAT IN 2 HOURS IF HEADACHE PERSISTS OR RECURS. 05/20/24 05/20/25  Breeback, Jade L, PA-C  valACYclovir  (VALTREX ) 1000 MG tablet Take 1 tablet (1,000 mg total) by mouth 2 (two) times daily. 05/20/24   Breeback, Jade L, PA-C  WEGOVY  0.5 MG/0.5ML SOAJ Inject 0.5 mg into the skin once a week. Use this dose for 1 month (4 shots) and then increase to next higher dose. 05/06/24   Breeback, Jade L, PA-C  WEGOVY  1 MG/0.5ML SOAJ Inject 1 mg into the skin once a week. Use this dose for 1 month (4  shots) and then increase to next higher dose. 06/17/24   Breeback, Jade L, PA-C  WEGOVY  1.7 MG/0.75ML SOAJ Inject 1.7 mg into the skin once a week. Use this dose for 1 month (4 shots) and then increase to next higher dose. 06/14/24   Breeback, Jade L, PA-C  WEGOVY  2.4 MG/0.75ML SOAJ Inject 2.4 mg into the skin once a week. 06/14/24   Breeback, Jade L, PA-C    Allergies: Influenza vaccines, Latex, Vyvanse  [lisdexamfetamine dimesylate ], Wellbutrin  [bupropion ], Hydrocodone , and Hydrocodone -acetaminophen     Review of Systems  Cardiovascular:  Positive for palpitations.    Updated Vital Signs BP 122/82   Pulse 94   Temp 99.6 F (37.6 C) (Oral)   Resp 19   Ht 5' 2 (1.575 m)   Wt 78.9 kg   SpO2 100%   BMI 31.83 kg/m   Physical Exam Vitals and nursing note reviewed.  Constitutional:      General: She is not in acute distress.    Appearance: She is well-developed. She is not diaphoretic.  HENT:     Head: Normocephalic and atraumatic.     Right Ear: External ear normal.     Left Ear: External ear normal.     Mouth/Throat:     Pharynx: No oropharyngeal exudate.  Eyes:     General: No scleral icterus.    Extraocular Movements: Extraocular movements intact.     Conjunctiva/sclera: Conjunctivae normal.     Pupils: Pupils are equal, round, and reactive to light.  Neck:     Thyroid : No thyromegaly.     Vascular: No JVD.     Comments: No meningismus Cardiovascular:     Rate and Rhythm: Normal rate and regular rhythm.     Heart sounds: Normal heart sounds. No murmur heard.    No friction rub. No gallop.  Pulmonary:     Effort: Pulmonary effort is normal. No respiratory distress.     Breath sounds: Normal breath sounds.  Abdominal:     General: Bowel sounds are normal. There is no distension.     Palpations: Abdomen is soft. There is no mass.     Tenderness: There is no abdominal tenderness. There is no guarding.  Musculoskeletal:        General: No tenderness. Normal range of  motion.     Cervical back: Normal range of motion and neck supple.     Right lower leg: No edema.     Left lower leg: No edema.     Comments: No meningismus  Skin:    General: Skin is warm and dry.     Findings: No rash.  Neurological:     Mental Status: She is alert and oriented to person, place, and time.     Cranial Nerves: No  cranial nerve deficit.     Coordination: Coordination normal.     Deep Tendon Reflexes: Reflexes are normal and symmetric.     Comments: Speech is clear and goal oriented, follows commands Major Cranial nerves without deficit, no facial droop Normal strength in upper and lower extremities bilaterally including dorsiflexion and plantar flexion, strong and equal grip strength Sensation normal to light and sharp touch Moves extremities without ataxia, coordination intact Normal finger to nose and rapid alternating movements Neg romberg, no pronator drift Normal gait Normal heel-shin and balance   Psychiatric:        Behavior: Behavior normal.     (all labs ordered are listed, but only abnormal results are displayed) Labs Reviewed  PREGNANCY, URINE  MAGNESIUM  CBC  COMPREHENSIVE METABOLIC PANEL WITH GFR  TSH  D-DIMER, QUANTITATIVE  TROPONIN T, HIGH SENSITIVITY    EKG: None  Radiology: No results found.   Procedures   Medications Ordered in the ED - No data to display  Clinical Course as of 07/16/24 1636  Fri Jul 16, 2024  1402 WBC(!): 12.9 [AH]  1402 D-Dimer, Quant: 0.27 [AH]  1402 Troponin T High Sensitivity: <15 [AH]    Clinical Course User Index [AH] Arloa Chroman, PA-C                                 Medical Decision Making Amount and/or Complexity of Data Reviewed Labs: ordered. Decision-making details documented in ED Course. Radiology: ordered.   This patient presents to the ED for concern of heart, palpitations and near syncope as well as some spots in her vision that have resolved and a mild headache, this involves an  extensive number of treatment options, and is a complaint that carries with it a high risk of complications and morbidity.  The differential diagnosis for palpitations includes cardiac arrhythmias, PVC/PAC, ACS, Cardiomyopathy, CHF, MVP, pericarditis, valvular disease, Panic/Anxiety, Somatic disorder, ETOH, Caffeine,  Stimulant use, medication side effect, Anemia, Hyperthyroidism, pulmonary embolism.   Co morbidities:   has a past medical history of Allergy, Anxiety, Arthritis, Atrial fibrillation (HCC), B12 deficiency, Complication of anesthesia, Constipation, Depression, Foreign body in foot, left (07/2013), GERD (gastroesophageal reflux disease), Hematuria, Irregular menses, Palpitations, PONV (postoperative nausea and vomiting), Sinus tachycardia, Thyroid  nodule, and Vitamin D  deficiency.   Social Determinants of Health:  . SDOH Screenings   Food Insecurity: No Food Insecurity (06/29/2024)  Housing: Unknown (06/29/2024)  Transportation Needs: No Transportation Needs (06/29/2024)  Alcohol Screen: Low Risk  (06/29/2024)  Depression (PHQ2-9): Low Risk  (03/09/2024)  Recent Concern: Depression (PHQ2-9) - High Risk (02/17/2024)  Financial Resource Strain: Medium Risk (06/29/2024)  Physical Activity: Insufficiently Active (06/29/2024)  Social Connections: Moderately Isolated (06/29/2024)  Stress: Stress Concern Present (06/29/2024)  Tobacco Use: High Risk (07/16/2024)     Additional history:  {Additional history obtained from review of EMR   Lab Tests:  I Ordered, and personally interpreted labs.  The pertinent results include:   2 negative troponins, negative pregnancy, magnesium within normal limits, mildly elevated white blood cell count of insignificant value, CMP shows a glucose of 100 also insignificant D-dimer negative.   Imaging Studies:  I ordered imaging studies including CT head I independently visualized and interpreted imaging which showed no acute findings I agree with the  radiologist interpretation  Cardiac Monitoring/ECG:  The patient was maintained on a cardiac monitor.  I personally viewed and interpreted the cardiac monitored which showed an  underlying rhythm of:  Sinus tachycardia Negative orthostatic vital signs   Medicines ordered and prescription drug management: Considered migraine cocktail however her headache is mild  Test Considered:   CT angiogram of the chest to rule out PE however D-dimer is negative  Critical Interventions:    Consultations Obtained:   Problem List / ED Course:     ICD-10-CM   1. Racing heart beat  R00.0 Ambulatory referral to Cardiology    2. Near syncope  R55 Ambulatory referral to Cardiology    3. Spots in front of the eye  H43.399       MDM: Patient here with longstanding history of irregular heartbeat with palpitations racing heart near syncope.  She had some description of white spots in front of her eyes earlier which may have been related to near syncope however I also considered the potential for atrial fibrillation or embolic stroke.  She has no other neurologic deficits I doubt this was a TIA she has no evidence of ACS or pulmonary embolus and I think this may be more likely related to the headache as she does have a history of migraines.  Patient is not having any visual field deficits on exam and no visual disturbances at this time.  I think she safe to follow-up with her primary care regarding this and will refer to the patient to cardiology has been sometime since she has seen them for her racing heartbeat.  She appears otherwise appropriate for discharge at this time.  With strict return precautions      Final diagnoses:  None    ED Discharge Orders     None          Arloa Chroman, PA-C 07/16/24 1643    Tegeler, Lonni PARAS, MD 07/17/24 [phone]

## 2024-07-16 NOTE — Telephone Encounter (Signed)
 Patient advised to go to ED ASAP.

## 2024-07-16 NOTE — ED Triage Notes (Signed)
 Patient arrives POV with complaints of intermittent palpitations x1 week.  Patient also reports white spots in her vision with the palpitations.  Also reports shooting pains in her right foot as well.

## 2024-07-19 ENCOUNTER — Encounter: Payer: Self-pay | Admitting: Professional

## 2024-07-19 ENCOUNTER — Encounter: Payer: Self-pay | Admitting: Physician Assistant

## 2024-07-19 ENCOUNTER — Other Ambulatory Visit (HOSPITAL_BASED_OUTPATIENT_CLINIC_OR_DEPARTMENT_OTHER): Payer: Self-pay

## 2024-07-19 ENCOUNTER — Ambulatory Visit (INDEPENDENT_AMBULATORY_CARE_PROVIDER_SITE_OTHER): Admitting: Professional

## 2024-07-19 ENCOUNTER — Ambulatory Visit (INDEPENDENT_AMBULATORY_CARE_PROVIDER_SITE_OTHER): Admitting: Physician Assistant

## 2024-07-19 VITALS — BP 116/77 | HR 103 | Ht 62.0 in | Wt 171.0 lb

## 2024-07-19 DIAGNOSIS — F4310 Post-traumatic stress disorder, unspecified: Secondary | ICD-10-CM | POA: Diagnosis not present

## 2024-07-19 DIAGNOSIS — R8290 Unspecified abnormal findings in urine: Secondary | ICD-10-CM | POA: Diagnosis not present

## 2024-07-19 DIAGNOSIS — F411 Generalized anxiety disorder: Secondary | ICD-10-CM

## 2024-07-19 DIAGNOSIS — E611 Iron deficiency: Secondary | ICD-10-CM | POA: Diagnosis not present

## 2024-07-19 DIAGNOSIS — R34 Anuria and oliguria: Secondary | ICD-10-CM | POA: Insufficient documentation

## 2024-07-19 DIAGNOSIS — R319 Hematuria, unspecified: Secondary | ICD-10-CM

## 2024-07-19 DIAGNOSIS — E6609 Other obesity due to excess calories: Secondary | ICD-10-CM

## 2024-07-19 DIAGNOSIS — R809 Proteinuria, unspecified: Secondary | ICD-10-CM | POA: Diagnosis not present

## 2024-07-19 DIAGNOSIS — R002 Palpitations: Secondary | ICD-10-CM | POA: Diagnosis not present

## 2024-07-19 DIAGNOSIS — T3695XA Adverse effect of unspecified systemic antibiotic, initial encounter: Secondary | ICD-10-CM | POA: Diagnosis not present

## 2024-07-19 DIAGNOSIS — R7303 Prediabetes: Secondary | ICD-10-CM

## 2024-07-19 DIAGNOSIS — F3163 Bipolar disorder, current episode mixed, severe, without psychotic features: Secondary | ICD-10-CM

## 2024-07-19 DIAGNOSIS — D72829 Elevated white blood cell count, unspecified: Secondary | ICD-10-CM | POA: Diagnosis not present

## 2024-07-19 DIAGNOSIS — M25512 Pain in left shoulder: Secondary | ICD-10-CM | POA: Diagnosis not present

## 2024-07-19 DIAGNOSIS — B379 Candidiasis, unspecified: Secondary | ICD-10-CM | POA: Diagnosis not present

## 2024-07-19 DIAGNOSIS — F330 Major depressive disorder, recurrent, mild: Secondary | ICD-10-CM

## 2024-07-19 LAB — POCT URINALYSIS DIP (CLINITEK)
Bilirubin, UA: NEGATIVE
Glucose, UA: NEGATIVE mg/dL
Leukocytes, UA: NEGATIVE
Nitrite, UA: NEGATIVE
POC PROTEIN,UA: >=300 — AB
Spec Grav, UA: 1.02 (ref 1.010–1.025)
Urobilinogen, UA: 1 U/dL
pH, UA: 8.5 — AB (ref 5.0–8.0)

## 2024-07-19 MED ORDER — FLUCONAZOLE 150 MG PO TABS
150.0000 mg | ORAL_TABLET | Freq: Once | ORAL | 0 refills | Status: AC
  Filled 2024-07-19: qty 1, 1d supply, fill #0

## 2024-07-19 MED ORDER — SULFAMETHOXAZOLE-TRIMETHOPRIM 800-160 MG PO TABS
1.0000 | ORAL_TABLET | Freq: Two times a day (BID) | ORAL | 0 refills | Status: DC
  Filled 2024-07-19: qty 6, 3d supply, fill #0

## 2024-07-19 NOTE — Patient Instructions (Addendum)
 Make sure staying hydrated.  Will get labs today.  Will order zio patch for heart monitoring.  Palpitations Palpitations are feelings that your heartbeat is irregular or is faster than normal. It may feel like your heart is fluttering or skipping a beat. Palpitations may be caused by many things, including smoking, caffeine, alcohol, stress, and certain medicines or drugs. Most causes of palpitations are not serious.  However, some palpitations can be a sign of a serious problem. Further tests and a thorough medical history will be done to find the cause of your palpitations. Your provider may order tests such as an ECG, labs, an echocardiogram, or an ambulatory continuous ECG monitor. Follow these instructions at home: Pay attention to any changes in your symptoms. Let your health care provider know about them. Take these actions to help manage your symptoms: Eating and drinking Follow instructions from your health care provider about eating or drinking restrictions. You may need to avoid foods and drinks that may cause palpitations. These may include: Caffeinated coffee, tea, soft drinks, and energy drinks. Chocolate. Alcohol. Diet pills. Lifestyle     Take steps to reduce your stress and anxiety. Things that can help you relax include: Yoga. Mind-body activities, such as deep breathing, meditation, or using words and images to create positive thoughts (guided imagery). Physical activity, such as swimming, jogging, or walking. Tell your health care provider if your palpitations increase with activity. If you have chest pain or shortness of breath with activity, do not continue the activity until you are seen by your health care provider. Biofeedback. This is a method that helps you learn to use your mind to control things in your body, such as your heartbeat. Get plenty of rest and sleep. Keep a regular bed time. Do not use drugs, including cocaine or ecstasy. Do not use marijuana. Do not  use any products that contain nicotine  or tobacco. These products include cigarettes, chewing tobacco, and vaping devices, such as e-cigarettes. If you need help quitting, ask your health care provider. General instructions Take over-the-counter and prescription medicines only as told by your health care provider. Keep all follow-up visits. This is important. These may include visits for further testing if palpitations do not go away or get worse. Contact a health care provider if: You continue to have a fast or irregular heartbeat for a long period of time. You notice that your palpitations occur more often. Get help right away if: You have chest pain or shortness of breath. You have a severe headache. You feel dizzy or you faint. These symptoms may represent a serious problem that is an emergency. Do not wait to see if the symptoms will go away. Get medical help right away. Call your local emergency services (911 in the [place] Do not drive yourself to the hospital. Summary Palpitations are feelings that your heartbeat is irregular or is faster than normal. It may feel like your heart is fluttering or skipping a beat. Palpitations may be caused by many things, including smoking, caffeine, alcohol, stress, certain medicines, and drugs. Further tests and a thorough medical history may be done to find the cause of your palpitations. Get help right away if you faint or have chest pain, shortness of breath, severe headache, or dizziness. This information is not intended to replace advice given to you by your health care provider. Make sure you discuss any questions you have with your health care provider. Document Revised: 04/25/2021 Document Reviewed: 04/25/2021 [employer] Patient Education  2024 [employer]
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
## 2024-07-19 NOTE — Progress Notes (Unsigned)
 Established Patient Office Visit  Subjective   Patient ID: Kelsey Vaughan, female    DOB: Nov 21, 1976  Age: 48 y.o. MRN: [id number]  Chief Complaint  Patient presents with   Weight Loss    HPI Racing heart  Cardiology Tsh good   Patient Active Problem List   Diagnosis Date Noted   Proteinuria 07/19/2024   Leukocytosis 07/19/2024   Decreased urination 07/19/2024   Left eye pain 05/18/2024   Muscle cramps 05/18/2024   Xanthelasma 05/18/2024   Class 1 obesity due to excess calories without serious comorbidity with body mass index (BMI) of 34.0 to 34.9 in adult 03/30/2024   Elevated LDL cholesterol level 03/30/2024   Obesity (BMI 30.0-34.9) 01/28/2024   Multiple thyroid  nodules 10/06/2023   Thyroid  enlarged 10/06/2023   Primary insomnia 09/29/2023   SOB (shortness of breath) 08/15/2023   Non-recurrent acute serous otitis media of left ear 06/03/2023   Panic attacks 01/28/2023   Class 1 obesity due to excess calories without serious comorbidity with body mass index (BMI) of 33.0 to 33.9 in adult 01/28/2023   Pre-diabetes 01/27/2023   Panic disorder 07/22/2022   Blurry vision, bilateral 07/22/2022   Vision changes 01/07/2022   Near syncope 01/07/2022   Medication management 10/19/2021   Frontal lobe and executive function deficit 08/06/2021   Difficulty coping 08/06/2021   Bipolar 1 disorder, mixed, severe (HCC) 07/09/2021   Mood disorder (HCC) 07/09/2021   Severe episode of recurrent major depressive disorder, without psychotic features (HCC) 07/09/2021   Mass of left side of neck 06/12/2021   Abnormal urine color 06/12/2021   Left elbow pain 10/04/2020   Numbness and tingling of right arm 06/28/2020   Metatarsalgia of right foot 01/10/2020   Choking 11/17/2019   Dysphagia 11/17/2019   Patellofemoral pain syndrome of left knee 10/27/2019   Current smoker 10/22/2019   Anxiety 10/22/2019   Tendinosis of left rotator cuff 09/13/2019   Chronic left-sided low back  pain with left-sided sciatica 06/02/2019   Impulsiveness 02/15/2019   Inattention 02/15/2019   Dry eye of right side 01/18/2019   Pterygium of right eye 01/18/2019   Cramping of feet 01/07/2019   Paresthesia 01/07/2019   Lumbar facet arthropathy 12/17/2018   Acute stress disorder 09/03/2018   Trouble in sleeping 09/03/2018   Fatigue 09/03/2018   Chronic glomerulonephritis 08/11/2018   Elevated fasting glucose 05/25/2018   Hypertriglyceridemia 05/25/2018   Binge-eating disorder, mild 05/20/2018   Irritability and anger 02/23/2018   Mood changes 12/24/2017   Irritable 12/24/2017   Vaginal discharge 11/04/2017   Vaginal irritation 11/04/2017   Lower abdominal pain 11/04/2017   Severe anxiety 10/14/2017   Stress reaction 10/12/2017   Frontal headache 09/16/2017   MDD (major depressive disorder), recurrent episode, mild (HCC) 09/03/2017   Insulin  resistance 07/30/2017   Epigastric abdominal pain 04/17/2017   Constipation 04/17/2017   Hematuria 04/17/2017   Chronic sinusitis 04/07/2017   Leiomyoma of uterus 02/26/2017   Renal cyst, right 02/26/2017   Sinus tachycardia 02/06/2017   Palpitations 01/29/2017   PAC (premature atrial contraction) 01/29/2017   Vitamin D  deficiency 12/23/2016   B12 deficiency 12/23/2016   Dysmenorrhea 12/19/2016   Bilateral cold feet 12/19/2016   New daily persistent headache 12/19/2016   GAD (generalized anxiety disorder) 12/19/2016   Tobacco dependence 12/19/2016   Tachycardia, paroxysmal (HCC) 03/07/2016   Microscopic hematuria 02/29/2016   Allergy to influenza vaccine 10/02/2015   Class 1 obesity due to excess calories without serious comorbidity with body mass index (  BMI) of 30.0 to 30.9 in adult 11/25/2014   Neck strain 11/04/2014   Sinus tarsi syndrome of left ankle 07/16/2013   Plantar fascial fibromatosis of left foot 07/15/2013   Past Medical History:  Diagnosis Date   Allergy    Anxiety    Arthritis    Atrial fibrillation (HCC)     a. occuring in 02/2016   B12 deficiency    Complication of anesthesia    Constipation    Depression    Foreign body in foot, left 07/2013   GERD (gastroesophageal reflux disease)    Hematuria    Irregular menses    due to oral contraceptive   Palpitations    a. 01/2017: Event monitor showing SR with PVC's.    PONV (postoperative nausea and vomiting)    Sinus tachycardia    Thyroid  nodule    Vitamin D  deficiency    Past Surgical History:  Procedure Laterality Date   CESAREAN SECTION     FOREIGN BODY REMOVAL Left 08/05/2013   Procedure: LEFT FOOT FOREIGN BODY REMOVAL ADULT;  Surgeon: Norleen Armor, MD;  Location: [facility];  Service: Orthopedics;  Laterality: Left;   SHOULDER ARTHROSCOPY Bilateral    TUBAL LIGATION     Family History  Problem Relation Age of Onset   Diabetes Mother    Hypertension Mother    Kidney disease Mother    Arthritis Mother    CAD Father    Heart attack Father        died from MI at the age of 61   Heart disease Father    CAD Sister        stents placed at age 75   Asthma Sister    Heart disease Paternal Grandmother    Heart disease Paternal Uncle    Breast cancer Neg Hx    Colon cancer Neg Hx    Colon polyps Neg Hx    Esophageal cancer Neg Hx    Stomach cancer Neg Hx    Rectal cancer Neg Hx    Allergies  Allergen Reactions   Influenza Vaccines Hives and Rash   Latex Itching and Rash   Vyvanse  [Lisdexamfetamine Dimesylate ] Other (See Comments)    Headache/mood changes.    Wellbutrin  [Bupropion ] Other (See Comments)    Possible nipple soreness/headaches/not feeling right   Hydrocodone  Nausea And Vomiting   Hydrocodone -Acetaminophen  Rash and Nausea Only      ROS    Objective:     There were no vitals taken for this visit. BP Readings from Last 3 Encounters:  07/19/24 116/77  07/16/24 106/79  05/18/24 116/74   Wt Readings from Last 3 Encounters:  07/19/24 171 lb (77.6 kg)  07/16/24 174 lb (78.9 kg)  05/18/24  177 lb (80.3 kg)      Physical Exam    The 10-year ASCVD risk score (Arnett DK, et al., 2019) is: 2.6%    Assessment & Plan:   No follow-ups on file.    Lezlee Gills, PA-C

## 2024-07-20 ENCOUNTER — Ambulatory Visit: Payer: Self-pay | Admitting: Physician Assistant

## 2024-07-20 ENCOUNTER — Encounter: Payer: Self-pay | Admitting: Physician Assistant

## 2024-07-20 DIAGNOSIS — R34 Anuria and oliguria: Secondary | ICD-10-CM

## 2024-07-20 DIAGNOSIS — E611 Iron deficiency: Secondary | ICD-10-CM

## 2024-07-20 DIAGNOSIS — D72829 Elevated white blood cell count, unspecified: Secondary | ICD-10-CM

## 2024-07-20 DIAGNOSIS — T887XXA Unspecified adverse effect of drug or medicament, initial encounter: Secondary | ICD-10-CM

## 2024-07-20 DIAGNOSIS — R790 Abnormal level of blood mineral: Secondary | ICD-10-CM

## 2024-07-20 DIAGNOSIS — R809 Proteinuria, unspecified: Secondary | ICD-10-CM

## 2024-07-20 DIAGNOSIS — R3129 Other microscopic hematuria: Secondary | ICD-10-CM

## 2024-07-20 NOTE — Progress Notes (Signed)
 WBC up a little more. I am hoping after you start the bactrim  you will start to feel better. Will recheck WBC in 1 week.   I am going to hold on ordering the zio patch until infection is treated. If symptoms resolve no need for patch.

## 2024-07-21 ENCOUNTER — Other Ambulatory Visit (HOSPITAL_BASED_OUTPATIENT_CLINIC_OR_DEPARTMENT_OTHER): Payer: Self-pay

## 2024-07-21 ENCOUNTER — Encounter: Payer: Self-pay | Admitting: Physician Assistant

## 2024-07-21 DIAGNOSIS — R790 Abnormal level of blood mineral: Secondary | ICD-10-CM | POA: Insufficient documentation

## 2024-07-21 DIAGNOSIS — E611 Iron deficiency: Secondary | ICD-10-CM | POA: Insufficient documentation

## 2024-07-21 LAB — IRON,TIBC AND FERRITIN PANEL
Ferritin: 102 ng/mL (ref 15–150)
Iron Saturation: 6 — AB (ref 15–55)
Iron: 16 ug/dL — AB (ref 27–159)
Total Iron Binding Capacity: 289 ug/dL (ref 250–450)
UIBC: 273 ug/dL (ref 131–425)

## 2024-07-21 LAB — CBC WITH DIFFERENTIAL/PLATELET
Basophils Absolute: 0 x10E3/uL (ref 0.0–0.2)
Basos: 0 %
EOS (ABSOLUTE): 0 x10E3/uL (ref 0.0–0.4)
Eos: 0 %
Hematocrit: 39.3 % (ref 34.0–46.6)
Hemoglobin: 12.6 g/dL (ref 11.1–15.9)
Immature Grans (Abs): 0 x10E3/uL (ref 0.0–0.1)
Immature Granulocytes: 0 %
Lymphocytes Absolute: 1.1 x10E3/uL (ref 0.7–3.1)
Lymphs: 8 %
MCH: 28.7 pg (ref 26.6–33.0)
MCHC: 32.1 g/dL (ref 31.5–35.7)
MCV: 90 fL (ref 79–97)
Monocytes Absolute: 0.1 x10E3/uL (ref 0.1–0.9)
Monocytes: 1 %
Neutrophils Absolute: 12.2 x10E3/uL — ABNORMAL HIGH (ref 1.4–7.0)
Neutrophils: 91 %
Platelets: 276 x10E3/uL (ref 150–450)
RBC: 4.39 x10E6/uL (ref 3.77–5.28)
RDW: 14.8 % (ref 11.7–15.4)
WBC: 13.5 x10E3/uL — ABNORMAL HIGH (ref 3.4–10.8)

## 2024-07-21 LAB — B12 AND FOLATE PANEL
Folate: 4.2 ng/mL (ref >3.0)
Vitamin B-12: 449 pg/mL (ref 232–1245)

## 2024-07-21 LAB — SPECIMEN STATUS REPORT

## 2024-07-21 LAB — BMP8+EGFR
BUN/Creatinine Ratio: 14 (ref 9–23)
BUN: 13 mg/dL (ref 6–24)
CO2: 19 mmol/L — ABNORMAL LOW (ref 20–29)
Calcium: 9.1 mg/dL (ref 8.7–10.2)
Chloride: 105 mmol/L (ref 96–106)
Creatinine, Ser: 0.96 mg/dL (ref 0.57–1.00)
Glucose: 124 mg/dL — ABNORMAL HIGH (ref 70–99)
Potassium: 3.8 mmol/L (ref 3.5–5.2)
Sodium: 140 mmol/L (ref 134–144)
eGFR: 73 mL/min/1.73 (ref >59)

## 2024-07-21 LAB — URINE CULTURE

## 2024-07-21 MED ORDER — FUSION PLUS PO CAPS
1.0000 | ORAL_CAPSULE | Freq: Every day | ORAL | 1 refills | Status: DC
  Filled 2024-07-21: qty 90, 90d supply, fill #0

## 2024-07-21 NOTE — Addendum Note (Signed)
 Addended by: ANTONIETTE VERMELL CROME on: 07/21/2024 12:33 PM   Modules accepted: Orders

## 2024-07-21 NOTE — Progress Notes (Signed)
 Brinlynn,   That's a normal glucose to not be fasting. No concerns with blood sugar.  Culture did not show any bacteria in urine.  Kidney function improved! B12 and folate normal.  Your iron  is low. Do you tolerate oral iron ?  This still does not explain blood and protein in urine. I would like to get CT of abdomen and pelvis to evaluate that.

## 2024-07-21 NOTE — Telephone Encounter (Signed)
 Ok for letter

## 2024-07-26 ENCOUNTER — Ambulatory Visit (INDEPENDENT_AMBULATORY_CARE_PROVIDER_SITE_OTHER)

## 2024-07-26 ENCOUNTER — Other Ambulatory Visit (HOSPITAL_BASED_OUTPATIENT_CLINIC_OR_DEPARTMENT_OTHER): Payer: Self-pay

## 2024-07-26 ENCOUNTER — Encounter: Payer: Self-pay | Admitting: Physician Assistant

## 2024-07-26 DIAGNOSIS — D72829 Elevated white blood cell count, unspecified: Secondary | ICD-10-CM

## 2024-07-26 DIAGNOSIS — R809 Proteinuria, unspecified: Secondary | ICD-10-CM

## 2024-07-26 DIAGNOSIS — R319 Hematuria, unspecified: Secondary | ICD-10-CM | POA: Diagnosis not present

## 2024-07-26 DIAGNOSIS — R3129 Other microscopic hematuria: Secondary | ICD-10-CM | POA: Diagnosis not present

## 2024-07-26 DIAGNOSIS — R34 Anuria and oliguria: Secondary | ICD-10-CM

## 2024-07-26 MED ORDER — IOHEXOL 300 MG/ML  SOLN
200.0000 mL | Freq: Once | INTRAMUSCULAR | Status: AC | PRN
  Administered 2024-07-26 (×2): 125 mL via INTRAVENOUS

## 2024-07-28 ENCOUNTER — Ambulatory Visit: Admitting: Physician Assistant

## 2024-07-30 ENCOUNTER — Ambulatory Visit: Payer: Self-pay | Admitting: Physician Assistant

## 2024-07-30 NOTE — Progress Notes (Signed)
 Patient informed of results.

## 2024-07-30 NOTE — Progress Notes (Signed)
 GREAT news. CT showed no abnormalities.

## 2024-08-02 ENCOUNTER — Encounter: Payer: Self-pay | Admitting: Professional

## 2024-08-02 ENCOUNTER — Ambulatory Visit (INDEPENDENT_AMBULATORY_CARE_PROVIDER_SITE_OTHER): Admitting: Professional

## 2024-08-02 DIAGNOSIS — F4310 Post-traumatic stress disorder, unspecified: Secondary | ICD-10-CM

## 2024-08-02 DIAGNOSIS — F3163 Bipolar disorder, current episode mixed, severe, without psychotic features: Secondary | ICD-10-CM

## 2024-08-02 DIAGNOSIS — F330 Major depressive disorder, recurrent, mild: Secondary | ICD-10-CM

## 2024-08-02 DIAGNOSIS — F411 Generalized anxiety disorder: Secondary | ICD-10-CM | POA: Diagnosis not present

## 2024-08-02 NOTE — Progress Notes (Signed)
 [facility] Counselor/Therapist Progress Note  Patient ID: Kelsey Vaughan, MRN: [id number],    Date: 08/02/2024  Time Spent: 45 minutes 1-146pm  Treatment Type: Individual therapy  Risk Assessment: Danger to Self:  No Self-injurious Behavior: No Danger to Others: No  Subjective: This session was held via video teletherapy. The patient consented to video teletherapy and was located at her vehicle during this session. She is aware it is the responsibility of the patient to secure confidentiality on her end of the session. The provider was in a private home office for the duration of this session.    The patient arrived on time for her Caregility session.  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 a fresh MD out of school and they are looking to have an experiencing MA with him -got a call from the office manager at [employer] and she was offered even better income than requested; she asked for 18 and was offered 24 due to her experience -August 20th start date for orientation 4-communication with coworkers -clear and respectful -people are people first, careers are secondary -pt acknowledges she tends to work better with female MD's   Treatment Plan Problems: Bipolar Disorder - Mania, Childhood Trauma, Sleep Disturbance, Anger Control Problems Symptoms: Shows a pattern of episodic excessive anger in response to specific situations or situational  themes. Shows direct or indirect evidence of physiological arousal related to anger. Reports a history of explosive, aggressive outbursts out of proportion with any precipitating stressors, leading to verbal attacks, assaultive acts, or destruction of property. Displays overreactive verbal hostility to insignificant irritants. Makes swift and harsh judgmental statements to or about others. Displays body language suggesting anger, including tense muscles (e.g., clenched fist or jaw), glaring looks, or refusal to make eye contact. Shows passive-aggressive patterns (e.g., social withdrawal, lack of complete or timely compliance in following directions or rules, complaining about authority figures behind their backs, uncooperative in meeting expected behavioral norms) due to anger. Passively withholds feelings and then explodes in a rage. Demonstrates an angry overreaction to perceived disapproval, rejection, or criticism. Uses abusive language meant to intimidate others. Rationalizes and blames others for aggressive and abusive behavior. Uses aggression as a means of achieving power and control. Exhibits an abnormally and persistently elevated, expansive, or irritable mood with at least three symptoms of mania (i.e., inflated self-esteem or grandiosity, decreased need for sleep, pressured speech, flight of ideas, distractibility, excessive goal-directed activity or psychomotor agitation, excessive involvement in pleasurable, high-risk behavior). The elevated mood or irritability (mania) causes marked impairment in occupational functioning, social activities, or relationships with others. Reports flight of ideas or thoughts racing. Verbalizes grandiose ideas and/or persecutory beliefs. Shows evidence of a decreased need for sleep. Reports little or no appetite. Exhibits increased motor activity or agitation. Displays a poor attention span and is easily distracted. Loss of normal inhibition leads to  impulsive and excessive pleasure-oriented behavior without regard for painful consequences. Exhibits an expansive mood that can easily turn  to impatience and irritable anger if goal-oriented behavior is blocked or confronted. Lacks follow-through in projects, even though energy is very high, since behavior lacks discipline and goal-directedness. Reports of childhood physical, sexual, and/or emotional abuse. Description of parents as physically or emotionally neglectful as they were chemically dependent, too busy, absent, etc. Description of childhood as chaotic as parent(s) was substance abuser (or mentally ill, antisocial, etc.), leading to frequent moves, multiple abusive spousal partners, frequent substitute caretakers, financial pressures, and/or many stepsiblings. Irrational fears, suppressed rage, low self-esteem, identity conflicts, depression, or anxious insecurity related to painful early life experiences. Complains of difficulty falling asleep. Complains of difficulty remaining asleep. Insomnia or hypersomnia complaints due to a reversal of the normal sleep-wake schedule. Goals: Learn and implement anger management skills to reduce the level of anger and irritability that accompanies it. Increase respectful communication through the use of assertiveness and conflict resolution skills. Develop an awareness of angry thoughts, feelings, and actions, clarifying origins of, and learning alternatives to aggressive anger. Decrease the frequency, intensity, and duration of angry thoughts, feelings, and actions and increase the ability to recognize and respectfully express frustration and resolve conflict. Implement cognitive behavioral skills necessary to solve problems in a more constructive manner. Come to an awareness and acceptance of angry feelings while developing better control and more serenity. Become capable of handling angry feelings in constructive ways that enhance daily  functioning. Demonstrate respect for others and their feelings. Alleviate manic/hypomanic mood and return to previous level of effective functioning. Normalize energy level and return to usual activities, good judgment, stable mood, more realistic expectations, and goal-directed behavior. Reduce agitation, impulsivity, and pressured speech while achieving sensitivity to the consequences of behavior and having more realistic expectations. Achieve controlled behavior, moderated mood, more deliberative speech and thought process, and a stable daily activity pattern. Develop healthy cognitive patterns and beliefs about self and the world that lead to alleviation and help prevent the relapse of manic/hypomanic episodes. Talk about underlying feelings of low self-esteem or guilt and fears of rejection, dependency, and abandonment. Develop an awareness of how childhood issues have affected and continue to affect one's family life. Resolve past childhood/family issues, leading to less anger and depression, greater self-esteem, security, and confidence. Release the emotions associated with past childhood/family issues, resulting in less resentment and more serenity. Let go of blame and begin to forgive others for pain caused in childhood. Restore restful sleep pattern. Feel refreshed and energetic during wakeful hours. Objectives target date for all objectives is 02/16/2025: Keep a daily journal of persons, situations, and other triggers of anger; record thoughts, feelings, and actions taken. Verbalize increased awareness of anger expression patterns, their causes, and their consequences. Learn and implement calming and coping strategies as part of an overall approach to managing anger. Identify, challenge, and replace anger-inducing self-talk with self-talk that facilitates a less angry reaction. Learn and implement thought-stopping to manage intrusive unwanted thoughts that trigger anger. Verbalize an  understanding of assertive communication and how it can be used to express thoughts and feelings of anger in a controlled, respectful way. Describe mood state, energy level, amount of control over thoughts, and sleeping pattern. Identify and replace thoughts and behaviors that trigger manic or depressive symptoms. Differentiate between real and imagined losses, rejections, and abandonments. Verbalize grief, fear, and anger regarding real or imagined losses in life. Acknowledge the low self-esteem and fear of rejection that underlie the braggadocio. Describe what it was like to grow up in the home environment. Describe each  family member and identify the role each played within the family. Identify patterns of abuse, neglect, or abandonment within the family of origin, both current and historical, nuclear and extended. Identify feelings associated with major traumatic incidents in childhood and with parental child-rearing patterns. Identify how own parenting has been influenced by childhood experiences. Identify the positive aspects for self of being able to forgive all those involved with the abuse. Describe the history and details of sleep pattern. Verbalize depressive or anxious feelings and share possible causes. Verbalize an understanding of normal sleep, sleep disturbances, and their treatment. Learn and implement calming skills for use at bedtime. Practice good sleep hygiene. Learn and implement stimulus control strategies to establish a consistent sleep-wake rhythm. Learn and implement a sleep restriction method to increase sleep efficiency. Learn and implement skills for managing stresses contributing to the sleep problem. Interventions: Teach the client calming techniques (e.g., progressive muscle relaxation, breathing induced relaxation, calming imagery, cue-controlled relaxation, applied relaxation, mindful breathing) as part of a tailored strategy for reducing chronic and acute  physiological tension that accompanies the escalation of his/her angry feelings. Explore the client's self-talk that mediates his/her angry feelings and actions (e.g., demanding expectations reflected in should, must, or have-to statements); identify and challenge biases, assisting him/her in generating appraisals and self-talk that corrects for the biases and facilitates a more flexible and temperate response to frustration. Combine new self-talk with calming skills as part of a set of coping skills to manage anger. Role-play the use of relaxation and cognitive coping to visualized anger-provoking scenes, moving from low- to high-anger scenes. Assign the implementation of calming techniques in his/her daily life and when facing anger-triggering situations; process the results, reinforcing success and problem-solving obstacles. Assign the client to implement a thought-stopping technique in which he/she shouts STOP to himself/herself in his/her mind and then replaces the thought with an alternative that is calming (or assign Making Use of the Thought-Stopping Technique in the Adult Psychotherapy Homework Planner by [facility]); review implantation, reinforcing success and providing corrective feedback for failure. Use instruction, modeling, and/or role-playing to teach the client the distinctive elements as well as the pros and cons of assertive, unassertive (passive), and aggressive communication. Ask the client to self-monitor, keeping a daily journal in which he/she documents persons, situations, thoughts, feelings, and actions associated with moments of anger, irritation, or disappointment (or assign Anger Journal in the Adult Psychotherapy Homework Planner by Jenniffer); routinely process the journal toward helping the client understand his/her contributions to generating his/her anger. Assist the client in generating a list of anger triggers; process the list toward helping the client understand the  causes and expressions of his/her anger. Assist the client in re-conceptualizing anger as involving different dimensions (cognitive, physiological, affective, and behavioral) that interact predictably (e.g., demanding expectations not being met leading to increased arousal and anger leading to acting out) and that can be understood, challenged, and changed. Process the client's list of anger triggers and other relevant journal information toward helping the client understand how cognitive, physiological, and affective factors interplay to produce anger. Ask the client to list and discuss ways anger has negatively impacted his/her daily life (e.g., hurting others or self, legal conflicts, loss of respect from self and others, destruction of property); process this list. Assist the client in identifying the positive consequences of managing anger (e.g., respect from others and self, cooperation from others, improved physical health, etc.) (or assign Alternatives to Destructive Anger in the Adult Psychotherapy Homework Planner by Jenniffer). Assess the  client's sleep history including sleep pattern, bedtime routine, activities associated  with the bed, activity level while awake, nutritional habits including stimulant use, napping practice, actual sleep time, rhythm of time for being awake versus sleeping, and so on. Teach the client stimulus control techniques (e.g., lie down to sleep only when sleepy; do not use the bed for activities like watching television, reading, listening to music, but only for sleep or sexual activity; get out of bed if sleep doesn't arrive soon after retiring; lie back down when sleepy; set alarm to the same wake-up time every morning regardless of sleep time or quality; do not nap during the day); assign consistent implementation. Instruct the client to move activities associated with arousal and activation from the bedtime ritual to other times during the day (e.g., reading  stimulating content, reviewing day's events, planning for next day, watching disturbing television). Use cognitive behavioral skills training techniques (e.g., instruction, covert modeling [i.e., imagining the successful use of the strategies], role-play, practice, and generalization training) to teach the client tailored skills (e.g., calming and coping skills, conflict-resolution, problem-solving) for managing stressors related to the sleep disturbance (e.g., interpersonal conflicts that carry over and cause nighttime wakefulness); routinely review, reinforce successes, problem-solve obstacles toward effective everyday use (see Insomnia: A Clinical Guide to Assessment and Treatment by Morin and Espie; Treating Sleep Disorders by Michaeleen Pay and Lichstein). Assess the role of depression or anxiety as the cause of the client's sleep disturbance (see the Unipolar Depression or Anxiety chapters in this Planner). Provide the client with basic sleep education (e.g., normal length of sleep, normal variations of sleep, normal time to fall asleep, and normal midnight awakening; recommend The Insomnia Workbook: A Comprehensive Guide to Getting the Sleep You Need by Silberman); help the client understand the exact nature of his/her abnormal sleeping pattern. Teach the client relaxation skills (e.g., progressive muscle relaxation, guided imagery, slow diaphragmatic breathing); teach the client how to apply these skills to facilitate relaxation and sleep at bedtime (see Bedtime Relaxation Techniques by Otto armin Carbon). Instruct the client in sleep hygiene practices such as restricting excessive liquid intake, spicy late night snacks, or heavy evening meals; exercising regularly, but not within 3 - 4 hours of bedtime; minimizing or avoiding caffeine, alcohol, tobacco, and stimulant intake (or assign Sleep Pattern Record in the Adult Psychotherapy Homework Planner by [facility]). Encourage the client to share  his/her thoughts and feelings; express empathy, and build rapport while assessing primary cognitive, behavioral, interpersonal, or other symptoms of the mood disorder. Assess presence, severity, and impact of past and present mood episodes including mania (i.e., pressured speech, impulsive behavior, euphoric mood, flight of ideas, reduced need for sleep, inflated self-esteem, and high energy) on social, occupational, and interpersonal functioning; supplement with semi-structured inventory, if desired (e.g., Young Mania Rating Scale; the Clinical Monitoring Form); re-administer as indicated to assess treatment response. Use cognitive therapy techniques to explore and educate the client's about cognitive biases that trigger his/her elevated or depressive mood (see Cognitive Therapy for Bipolar Disorder by Lizette dunker al.). Teach the client cognitive behavioral coping and relapse prevention skills including delaying impulsive actions, structured scheduling of daily activities, keeping a regular sleep routine, avoiding unrealistic goal striving, using relaxation procedures, identifying and avoiding episode triggers such as stimulant drug use, alcohol consumption, breaking sleep routine, or exposing self to high stress (see Cognitive Therapy for Bipolar Disorder by Lizette dunker al.). Pledge to be there consistently to help, listen to, and support the client. Explore the client's fears  of abandonment by sources of love and nurturance. Help the client differentiate between real and imagined, actual and exaggerated losses. Probe real or perceived losses in the client's life. Review ways for the client to replace the losses and put them in perspective. Probe the causes for the client's low self-esteem and abandonment fears in the family-of-origin history. Develop the client's family genogram and/or symptom line and help identify patterns of dysfunction within the family. Assign the client to write a forgiveness letter to the  perpetrator of abuse (or assign Feelings and Forgiveness Letter in the Adult Psychotherapy Homework Planner by Jenniffer); process the letter. Teach the client the benefits (i.e., release of hurt and anger, putting issue in the past, opens door for trust of others, etc.) of beginning a process of forgiveness of (not necessarily forgetting or fraternizing with) abusive adults. Assist the client in clarifying his/her role within the family and his/her feelings connected to that role. Explore the client's painful childhood experiences (or assign Share the Painful Memory in the Adult Psychotherapy Homework Planner by Jenniffer). Support and encourage the client when he/she begins to express feelings of rage, sadness, fear, and rejection relating to family abuse or neglect. Assign the client to record feelings in a journal that describes memories, behavior, and emotions tied to his/her traumatic childhood experiences (or assign How the Trauma Affects Me in the Adult Psychotherapy Homework Planner by Jenniffer). Ask the client to compare his/her parenting behavior to that of parent figures of his/her childhood; encourage the client to be aware of how easily we repeat patterns that we grew up with.   Diagnosis:Bipolar 1 disorder, mixed, severe (HCC)  MDD (major depressive disorder), recurrent episode, mild (HCC)  GAD (generalized anxiety disorder)  PTSD (post-traumatic stress disorder)  R/O Bipolar Disorder  Plan:  -meet again on Monday, August 23, 2024 at 1pm

## 2024-08-05 ENCOUNTER — Other Ambulatory Visit (HOSPITAL_BASED_OUTPATIENT_CLINIC_OR_DEPARTMENT_OTHER): Payer: Self-pay

## 2024-08-05 ENCOUNTER — Encounter: Payer: Self-pay | Admitting: Physician Assistant

## 2024-08-05 DIAGNOSIS — R002 Palpitations: Secondary | ICD-10-CM

## 2024-08-09 ENCOUNTER — Ambulatory Visit: Admitting: Professional

## 2024-08-12 ENCOUNTER — Ambulatory Visit: Admitting: Sports Medicine

## 2024-08-17 ENCOUNTER — Encounter: Payer: Self-pay | Admitting: Sports Medicine

## 2024-08-20 ENCOUNTER — Ambulatory Visit: Attending: Physician Assistant

## 2024-08-20 DIAGNOSIS — R002 Palpitations: Secondary | ICD-10-CM

## 2024-08-23 ENCOUNTER — Ambulatory Visit (INDEPENDENT_AMBULATORY_CARE_PROVIDER_SITE_OTHER): Admitting: Professional

## 2024-08-23 ENCOUNTER — Encounter: Payer: Self-pay | Admitting: Professional

## 2024-08-23 ENCOUNTER — Ambulatory Visit: Admitting: Physician Assistant

## 2024-08-23 DIAGNOSIS — F330 Major depressive disorder, recurrent, mild: Secondary | ICD-10-CM

## 2024-08-23 DIAGNOSIS — F411 Generalized anxiety disorder: Secondary | ICD-10-CM | POA: Diagnosis not present

## 2024-08-23 DIAGNOSIS — F4310 Post-traumatic stress disorder, unspecified: Secondary | ICD-10-CM

## 2024-08-23 DIAGNOSIS — F3163 Bipolar disorder, current episode mixed, severe, without psychotic features: Secondary | ICD-10-CM

## 2024-08-23 NOTE — Progress Notes (Unsigned)
 EP to read.

## 2024-08-23 NOTE — Progress Notes (Signed)
 [facility] Counselor/Therapist Progress Note  Patient ID: Kelsey Vaughan, MRN: [id number],    Date: 08/23/2024  Time Spent: 30 minutes 102-132pm  Treatment Type: Individual therapy  Risk Assessment: Danger to Self:  No Self-injurious Behavior: No Danger to Others: No  Subjective: This session was held via video teletherapy. The patient consented to video teletherapy and was located at her vehicle during this session. She is aware it is the responsibility of the patient to secure confidentiality on her end of the session. The provider was in a private home office for the duration of this session.    The patient arrived on time for her Caregility session.  1-professional a-pt feels like a brand new person b-the staff and providers she works with have been excellent c-her provider starts beginning of October and she will then take over with him d-she goes to both practices (primary care and sports medicine) -feels everything is perfect and remains positive every day she goes in to work -she reports they were impressed with how quick she is learning and picking up 2-mood -much improved -pt recognizes negativity in other people now that she is in such a good place -pt wants to continue to enjoy life and not be a bitter person -has some days she feels depressed but it is improving -continues having palpitations and she will be wearing a halter monitor to evaluate 3-physical health -she has improved -she has to get iron  infused next Monday because her iron  is super-duper low -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[employer], corporate -do a lot of shrugging off 5-birthday trip -she did see her mom over her birthday trip and saw her -she stopped by her house and extended an invitation to go to the beach but she already had plans -she had prepared a meal for them -she came out on Saturday and spent the day in [place] -went back on Sunday to have dinner with her mother again -pt wants to  maintain a relationship with her mother 6-relationship with daughter -she is not interested in repairing the relationship -only if her daughter coming to her first would she consider -if not treating with respect she cannot have her in her life -her sister wants her to not feel negative toward her daughter because she is her daughter -when pt voices how she feels when she brings it up she stops them from trying to tell her how she is to feel -pt feels peaceful with the boundaries she has set -her daughter's son does not know her but she would show up if grandson needed -pt is choosing to move forward until an opportunity comes -pt admits that she needs to be in a healthy place so that she does not blow up -she conversed with a family member over the weekend and thinks the rift came when her grandmother took her from me -how to approach daughter assertively instead of aggressively  Treatment Plan Problems: Bipolar Disorder - Mania, Childhood Trauma, Sleep Disturbance, Anger Control Problems Symptoms: Shows a pattern of episodic excessive anger in response to specific situations or situational themes. Shows direct or indirect evidence of physiological arousal related to anger. Reports a history of explosive, aggressive outbursts out of proportion with any precipitating stressors, leading to verbal attacks, assaultive acts, or destruction of property. Displays overreactive verbal hostility to insignificant irritants. Makes swift and harsh judgmental statements to or about others. Displays body language suggesting anger, including tense muscles (e.g., clenched fist or jaw), glaring looks, or refusal to make eye  contact. Shows passive-aggressive patterns (e.g., social withdrawal, lack of complete or timely compliance in following directions or rules, complaining about authority figures behind their backs, uncooperative in meeting expected behavioral norms) due to anger. Passively withholds  feelings and then explodes in a rage. Demonstrates an angry overreaction to perceived disapproval, rejection, or criticism. Uses abusive language meant to intimidate others. Rationalizes and blames others for aggressive and abusive behavior. Uses aggression as a means of achieving power and control. Exhibits an abnormally and persistently elevated, expansive, or irritable mood with at least three symptoms of mania (i.e., inflated self-esteem or grandiosity, decreased need for sleep, pressured speech, flight of ideas, distractibility, excessive goal-directed activity or psychomotor agitation, excessive involvement in pleasurable, high-risk behavior). The elevated mood or irritability (mania) causes marked impairment in occupational functioning, social activities, or relationships with others. Reports flight of ideas or thoughts racing. Verbalizes grandiose ideas and/or persecutory beliefs. Shows evidence of a decreased need for sleep. Reports little or no appetite. Exhibits increased motor activity or agitation. Displays a poor attention span and is easily distracted. Loss of normal inhibition leads to impulsive and excessive pleasure-oriented behavior without regard for painful consequences. Exhibits an expansive mood that can easily turn to impatience and irritable anger if goal-oriented behavior is blocked or confronted. Lacks follow-through in projects, even though energy is very high, since behavior lacks discipline and goal-directedness. Reports of childhood physical, sexual, and/or emotional abuse. Description of parents as physically or emotionally neglectful as they were chemically dependent, too busy, absent, etc. Description of childhood as chaotic as parent(s) was substance abuser (or mentally ill, antisocial, etc.), leading to frequent moves, multiple abusive spousal partners, frequent substitute caretakers, financial pressures, and/or many stepsiblings. Irrational fears, suppressed  rage, low self-esteem, identity conflicts, depression, or anxious insecurity related to painful early life experiences. Complains of difficulty falling asleep. Complains of difficulty remaining asleep. Insomnia or hypersomnia complaints due to a reversal of the normal sleep-wake schedule. Goals: Learn and implement anger management skills to reduce the level of anger and irritability that accompanies it. Increase respectful communication through the use of assertiveness and conflict resolution skills. Develop an awareness of angry thoughts, feelings, and actions, clarifying origins of, and learning alternatives to aggressive anger. Decrease the frequency, intensity, and duration of angry thoughts, feelings, and actions and increase the ability to recognize and respectfully express frustration and resolve conflict. Implement cognitive behavioral skills necessary to solve problems in a more constructive manner. Come to an awareness and acceptance of angry feelings while developing better control and more serenity. Become capable of handling angry feelings in constructive ways that enhance daily functioning. Demonstrate respect for others and their feelings. Alleviate manic/hypomanic mood and return to previous level of effective functioning. Normalize energy level and return to usual activities, good judgment, stable mood, more realistic expectations, and goal-directed behavior. Reduce agitation, impulsivity, and pressured speech while achieving sensitivity to the consequences of behavior and having more realistic expectations. Achieve controlled behavior, moderated mood, more deliberative speech and thought process, and a stable daily activity pattern. Develop healthy cognitive patterns and beliefs about self and the world that lead to alleviation and help prevent the relapse of manic/hypomanic episodes. Talk about underlying feelings of low self-esteem or guilt and fears of rejection, dependency,  and abandonment. Develop an awareness of how childhood issues have affected and continue to affect one's family life. Resolve past childhood/family issues, leading to less anger and depression, greater self-esteem, security, and confidence. Release the emotions associated with past childhood/family issues, resulting  in less resentment and more serenity. Let go of blame and begin to forgive others for pain caused in childhood. Restore restful sleep pattern. Feel refreshed and energetic during wakeful hours. Objectives target date for all objectives is 02/16/2025: Keep a daily journal of persons, situations, and other triggers of anger; record thoughts, feelings, and actions taken. Verbalize increased awareness of anger expression patterns, their causes, and their consequences. Learn and implement calming and coping strategies as part of an overall approach to managing anger. Identify, challenge, and replace anger-inducing self-talk with self-talk that facilitates a less angry reaction. Learn and implement thought-stopping to manage intrusive unwanted thoughts that trigger anger. Verbalize an understanding of assertive communication and how it can be used to express thoughts and feelings of anger in a controlled, respectful way. Describe mood state, energy level, amount of control over thoughts, and sleeping pattern. Identify and replace thoughts and behaviors that trigger manic or depressive symptoms. Differentiate between real and imagined losses, rejections, and abandonments. Verbalize grief, fear, and anger regarding real or imagined losses in life. Acknowledge the low self-esteem and fear of rejection that underlie the braggadocio. Describe what it was like to grow up in the home environment. Describe each family member and identify the role each played within the family. Identify patterns of abuse, neglect, or abandonment within the family of origin, both current and historical, nuclear and  extended. Identify feelings associated with major traumatic incidents in childhood and with parental child-rearing patterns. Identify how own parenting has been influenced by childhood experiences. Identify the positive aspects for self of being able to forgive all those involved with the abuse. Describe the history and details of sleep pattern. Verbalize depressive or anxious feelings and share possible causes. Verbalize an understanding of normal sleep, sleep disturbances, and their treatment. Learn and implement calming skills for use at bedtime. Practice good sleep hygiene. Learn and implement stimulus control strategies to establish a consistent sleep-wake rhythm. Learn and implement a sleep restriction method to increase sleep efficiency. Learn and implement skills for managing stresses contributing to the sleep problem. Interventions: Teach the client calming techniques (e.g., progressive muscle relaxation, breathing induced relaxation, calming imagery, cue-controlled relaxation, applied relaxation, mindful breathing) as part of a tailored strategy for reducing chronic and acute physiological tension that accompanies the escalation of his/her angry feelings. Explore the client's self-talk that mediates his/her angry feelings and actions (e.g., demanding expectations reflected in should, must, or have-to statements); identify and challenge biases, assisting him/her in generating appraisals and self-talk that corrects for the biases and facilitates a more flexible and temperate response to frustration. Combine new self-talk with calming skills as part of a set of coping skills to manage anger. Role-play the use of relaxation and cognitive coping to visualized anger-provoking scenes, moving from low- to high-anger scenes. Assign the implementation of calming techniques in his/her daily life and when facing anger-triggering situations; process the results, reinforcing success and problem-solving  obstacles. Assign the client to implement a thought-stopping technique in which he/she shouts STOP to himself/herself in his/her mind and then replaces the thought with an alternative that is calming (or assign Making Use of the Thought-Stopping Technique in the Adult Psychotherapy Homework Planner by [facility]); review implantation, reinforcing success and providing corrective feedback for failure. Use instruction, modeling, and/or role-playing to teach the client the distinctive elements as well as the pros and cons of assertive, unassertive (passive), and aggressive communication. Ask the client to self-monitor, keeping a daily journal in which he/she documents persons, situations, thoughts,  feelings, and actions associated with moments of anger, irritation, or disappointment (or assign Anger Journal in the Adult Psychotherapy Homework Planner by [facility]); routinely process the journal toward helping the client understand his/her contributions to generating his/her anger. Assist the client in generating a list of anger triggers; process the list toward helping the client understand the causes and expressions of his/her anger. Assist the client in re-conceptualizing anger as involving different dimensions (cognitive, physiological, affective, and behavioral) that interact predictably (e.g., demanding expectations not being met leading to increased arousal and anger leading to acting out) and that can be understood, challenged, and changed. Process the client's list of anger triggers and other relevant journal information toward helping the client understand how cognitive, physiological, and affective factors interplay to produce anger. Ask the client to list and discuss ways anger has negatively impacted his/her daily life (e.g., hurting others or self, legal conflicts, loss of respect from self and others, destruction of property); process this list. Assist the client in identifying the positive  consequences of managing anger (e.g., respect from others and self, cooperation from others, improved physical health, etc.) (or assign Alternatives to Destructive Anger in the Adult Psychotherapy Homework Planner by Jenniffer). Assess the client's sleep history including sleep pattern, bedtime routine, activities associated  with the bed, activity level while awake, nutritional habits including stimulant use, napping practice, actual sleep time, rhythm of time for being awake versus sleeping, and so on. Teach the client stimulus control techniques (e.g., lie down to sleep only when sleepy; do not use the bed for activities like watching television, reading, listening to music, but only for sleep or sexual activity; get out of bed if sleep doesn't arrive soon after retiring; lie back down when sleepy; set alarm to the same wake-up time every morning regardless of sleep time or quality; do not nap during the day); assign consistent implementation. Instruct the client to move activities associated with arousal and activation from the bedtime ritual to other times during the day (e.g., reading stimulating content, reviewing day's events, planning for next day, watching disturbing television). Use cognitive behavioral skills training techniques (e.g., instruction, covert modeling [i.e., imagining the successful use of the strategies], role-play, practice, and generalization training) to teach the client tailored skills (e.g., calming and coping skills, conflict-resolution, problem-solving) for managing stressors related to the sleep disturbance (e.g., interpersonal conflicts that carry over and cause nighttime wakefulness); routinely review, reinforce successes, problem-solve obstacles toward effective everyday use (see Insomnia: A Clinical Guide to Assessment and Treatment by Morin and Espie; Treating Sleep Disorders by Michaeleen Pay and Lichstein). Assess the role of depression or anxiety as the cause of the  client's sleep disturbance (see the Unipolar Depression or Anxiety chapters in this Planner). Provide the client with basic sleep education (e.g., normal length of sleep, normal variations of sleep, normal time to fall asleep, and normal midnight awakening; recommend The Insomnia Workbook: A Comprehensive Guide to Getting the Sleep You Need by Silberman); help the client understand the exact nature of his/her abnormal sleeping pattern. Teach the client relaxation skills (e.g., progressive muscle relaxation, guided imagery, slow diaphragmatic breathing); teach the client how to apply these skills to facilitate relaxation and sleep at bedtime (see Bedtime Relaxation Techniques by Otto armin [employer]). Instruct the client in sleep hygiene practices such as restricting excessive liquid intake, spicy late night snacks, or heavy evening meals; exercising regularly, but not within 3 - 4 hours of bedtime; minimizing or avoiding caffeine, alcohol, tobacco, and stimulant intake (or assign  Sleep Pattern Record in the Adult Psychotherapy Homework Planner by Jenniffer). Encourage the client to share his/her thoughts and feelings; express empathy, and build rapport while assessing primary cognitive, behavioral, interpersonal, or other symptoms of the mood disorder. Assess presence, severity, and impact of past and present mood episodes including mania (i.e., pressured speech, impulsive behavior, euphoric mood, flight of ideas, reduced need for sleep, inflated self-esteem, and high energy) on social, occupational, and interpersonal functioning; supplement with semi-structured inventory, if desired (e.g., Young Mania Rating Scale; the Clinical Monitoring Form); re-administer as indicated to assess treatment response. Use cognitive therapy techniques to explore and educate the client's about cognitive biases that trigger his/her elevated or depressive mood (see Cognitive Therapy for Bipolar Disorder by Lizette dunker al.). Teach the  client cognitive behavioral coping and relapse prevention skills including delaying impulsive actions, structured scheduling of daily activities, keeping a regular sleep routine, avoiding unrealistic goal striving, using relaxation procedures, identifying and avoiding episode triggers such as stimulant drug use, alcohol consumption, breaking sleep routine, or exposing self to high stress (see Cognitive Therapy for Bipolar Disorder by Lizette dunker al.). Pledge to be there consistently to help, listen to, and support the client. Explore the client's fears of abandonment by sources of love and nurturance. Help the client differentiate between real and imagined, actual and exaggerated losses. Probe real or perceived losses in the client's life. Review ways for the client to replace the losses and put them in perspective. Probe the causes for the client's low self-esteem and abandonment fears in the family-of-origin history. Develop the client's family genogram and/or symptom line and help identify patterns of dysfunction within the family. Assign the client to write a forgiveness letter to the perpetrator of abuse (or assign Feelings and Forgiveness Letter in the Adult Psychotherapy Homework Planner by Jenniffer); process the letter. Teach the client the benefits (i.e., release of hurt and anger, putting issue in the past, opens door for trust of others, etc.) of beginning a process of forgiveness of (not necessarily forgetting or fraternizing with) abusive adults. Assist the client in clarifying his/her role within the family and his/her feelings connected to that role. Explore the client's painful childhood experiences (or assign Share the Painful Memory in the Adult Psychotherapy Homework Planner by Jenniffer). Support and encourage the client when he/she begins to express feelings of rage, sadness, fear, and rejection relating to family abuse or neglect. Assign the client to record feelings in a journal that  describes memories, behavior, and emotions tied to his/her traumatic childhood experiences (or assign How the Trauma Affects Me in the Adult Psychotherapy Homework Planner by Jenniffer). Ask the client to compare his/her parenting behavior to that of parent figures of his/her childhood; encourage the client to be aware of how easily we repeat patterns that we grew up with.   Diagnosis:Bipolar 1 disorder, mixed, severe (HCC)  MDD (major depressive disorder), recurrent episode, mild (HCC)  GAD (generalized anxiety disorder)  PTSD (post-traumatic stress disorder)  R/O Bipolar Disorder  Plan:  -meet again on Monday, September 06 2024 at 1pm

## 2024-08-27 ENCOUNTER — Ambulatory Visit: Admitting: Physician Assistant

## 2024-08-27 DIAGNOSIS — E66811 Obesity, class 1: Secondary | ICD-10-CM

## 2024-08-30 ENCOUNTER — Inpatient Hospital Stay: Attending: Hematology and Oncology | Admitting: Hematology and Oncology

## 2024-08-30 ENCOUNTER — Encounter: Payer: Self-pay | Admitting: Hematology and Oncology

## 2024-08-30 ENCOUNTER — Inpatient Hospital Stay

## 2024-08-30 ENCOUNTER — Ambulatory Visit: Admitting: Professional

## 2024-08-30 ENCOUNTER — Other Ambulatory Visit: Payer: Self-pay | Admitting: *Deleted

## 2024-08-30 VITALS — BP 126/73 | HR 105 | Temp 98.0°F | Resp 18 | Ht 62.0 in | Wt 167.4 lb

## 2024-08-30 DIAGNOSIS — M1990 Unspecified osteoarthritis, unspecified site: Secondary | ICD-10-CM | POA: Insufficient documentation

## 2024-08-30 DIAGNOSIS — F17210 Nicotine dependence, cigarettes, uncomplicated: Secondary | ICD-10-CM | POA: Insufficient documentation

## 2024-08-30 DIAGNOSIS — I4891 Unspecified atrial fibrillation: Secondary | ICD-10-CM | POA: Diagnosis not present

## 2024-08-30 DIAGNOSIS — R5383 Other fatigue: Secondary | ICD-10-CM

## 2024-08-30 DIAGNOSIS — R634 Abnormal weight loss: Secondary | ICD-10-CM | POA: Insufficient documentation

## 2024-08-30 DIAGNOSIS — D72829 Elevated white blood cell count, unspecified: Secondary | ICD-10-CM | POA: Diagnosis present

## 2024-08-30 DIAGNOSIS — Z7982 Long term (current) use of aspirin: Secondary | ICD-10-CM | POA: Diagnosis not present

## 2024-08-30 DIAGNOSIS — Z79899 Other long term (current) drug therapy: Secondary | ICD-10-CM | POA: Diagnosis not present

## 2024-08-30 DIAGNOSIS — N926 Irregular menstruation, unspecified: Secondary | ICD-10-CM | POA: Insufficient documentation

## 2024-08-30 LAB — CBC WITH DIFFERENTIAL/PLATELET
Abs Immature Granulocytes: 0.03 K/uL (ref 0.00–0.07)
Basophils Absolute: 0.1 K/uL (ref 0.0–0.1)
Basophils Relative: 0 %
Eosinophils Absolute: 0.1 K/uL (ref 0.0–0.5)
Eosinophils Relative: 1 %
HCT: 36.3 % (ref 36.0–46.0)
Hemoglobin: 12.7 g/dL (ref 12.0–15.0)
Immature Granulocytes: 0 %
Lymphocytes Relative: 33 %
Lymphs Abs: 3.8 K/uL (ref 0.7–4.0)
MCH: 29.3 pg (ref 26.0–34.0)
MCHC: 35 g/dL (ref 30.0–36.0)
MCV: 83.8 fL (ref 80.0–100.0)
Monocytes Absolute: 0.8 K/uL (ref 0.1–1.0)
Monocytes Relative: 7 %
Neutro Abs: 6.9 K/uL (ref 1.7–7.7)
Neutrophils Relative %: 59 %
Platelets: 316 K/uL (ref 150–400)
RBC: 4.33 MIL/uL (ref 3.87–5.11)
RDW: 15.2 % (ref 11.5–15.5)
Smear Review: NORMAL
WBC: 11.7 K/uL — ABNORMAL HIGH (ref 4.0–10.5)
nRBC: 0 % (ref 0.0–0.2)

## 2024-08-30 LAB — TECHNOLOGIST SMEAR REVIEW: Plt Morphology: NORMAL

## 2024-08-30 LAB — IRON AND IRON BINDING CAPACITY (CC-WL,HP ONLY)
Iron: 63 ug/dL (ref 28–170)
Saturation Ratios: 22 % (ref 10.4–31.8)
TIBC: 287 ug/dL (ref 250–450)
UIBC: 224 ug/dL (ref 148–442)

## 2024-08-30 LAB — VITAMIN D 25 HYDROXY (VIT D DEFICIENCY, FRACTURES): Vit D, 25-Hydroxy: 65.74 ng/mL (ref 30–100)

## 2024-08-30 LAB — FERRITIN: Ferritin: 80 ng/mL (ref 11–307)

## 2024-08-30 NOTE — Progress Notes (Signed)
 [facility] CONSULT NOTE  Patient Care Team: Breeback, Jade L, PA-C as PCP - General (Family Medicine) Pietro Redell RAMAN, MD as PCP - Cardiology (Cardiology)  CHIEF COMPLAINTS/PURPOSE OF CONSULTATION:  Abnormal iron  saturation.  ASSESSMENT & PLAN:   Assessment and Plan Assessment & Plan Fatigue with rapid unintentional weight loss Fatigue and weight loss with symptoms of faintness, blurry vision, dizziness, and exhaustion. No anemia or iron  deficiency. Normal ferritin. Possible cardiac issues or sleep apnea. - Order autoimmune workup for lupus and rheumatoid arthritis. - Repeat iron  panel. - Check vitamin D  levels. - Refer to cardiologist for cardiac evaluation. - Advise contacting cardiologist for sleep study follow-up. - Recommend multivitamin.  Abnormal menstruation Irregular menstruation with prolonged dark bleeding for several months. On Cyrid EQ for cramps.  Elevated white blood cell count Slightly elevated white blood cell count for years, unchanged, not causing current symptoms. I dont believe any of her current symptoms are explained by her chronic leukocytosis.  I think she should continue to work with her PCP to investigate her symptoms   HISTORY OF PRESENTING ILLNESS:  Kelsey Vaughan 48 y.o. female is here because of abnormal iron  labs.  Discussed the use of AI scribe software for clinical note transcription with the patient, who gave verbal consent to proceed.  History of Present Illness Kelsey Vaughan is a 48 year old female who presents with fatigue and abnormal iron  labs. She was referred by Vermell Epley for evaluation of abnormal iron  labs.  She has experienced persistent fatigue and malaise since the end of July, characterized by constant tiredness and episodes of cold chills. Simple tasks like fixing breakfast leave her exhausted, necessitating rest. She also experiences blurry vision, dizziness, and a sensation of faintness, which have  progressively worsened, affecting her daily activities and social engagements.  In July, she was hospitalized for tests to rule out stroke or blood clots but was not admitted. Subsequent blood work by her primary care doctor revealed abnormal labs, with no improvement in her condition since then. She is concerned about her lab results, particularly her white blood cell count, which she has been told is elevated, but she has not received an explanation for this.  She denies any recent illness before the onset of symptoms and has not started any new medications recently. She has been on Wegovy  since April, resulting in a 20-pound weight loss over the past few months. No urinary symptoms are present, although she has a history of UTIs, with recent cultures being negative. She discontinued Bactrim  after confirming the absence of a UTI.  Her menstrual cycle has been irregular, with the current cycle lasting two weeks and characterized by dark, old blood rather than bright red. This pattern has persisted for three to four months. She has been on Cyrid EQ for menstrual cramps for several years, which previously regulated her periods.  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 Despite adequate sleep, she wakes up feeling tired and sometimes snores. She has not undergone a sleep study. She denies recent travel or significant life changes that could explain her symptoms.  Her past medical history includes atrial fibrillation, irregular menstruation, and vitamin D  deficiency. She has lumbar radiculopathy due to a bulging disc but is not currently experiencing pain. She has seasonal allergies and has been sneezing with congestion recently.  All other systems were reviewed with the patient and are negative.  MEDICAL HISTORY:  Past Medical History:  Diagnosis Date   Allergy    Anxiety    Arthritis    Atrial  fibrillation (HCC)    a. occuring in 02/2016   B12 deficiency    Complication of anesthesia    Constipation    Depression    Foreign body in foot, left 07/2013   GERD (gastroesophageal reflux disease)    Hematuria    Irregular menses    due to oral contraceptive   Palpitations    a. 01/2017: Event monitor showing SR with PVC's.    PONV (postoperative nausea and vomiting)    Sinus tachycardia    Thyroid  nodule    Vitamin D  deficiency     SURGICAL HISTORY: Past Surgical History:  Procedure Laterality Date   CESAREAN SECTION     FOREIGN BODY REMOVAL Left 08/05/2013   Procedure: LEFT FOOT FOREIGN BODY REMOVAL ADULT;  Surgeon: Norleen Armor, MD;  Location: [facility];  Service: Orthopedics;  Laterality: Left;   SHOULDER ARTHROSCOPY Bilateral    TUBAL LIGATION      SOCIAL HISTORY: Social History   Socioeconomic History   Marital status: Divorced    Spouse name: Not on file   Number of children: 3   Years of education: Not on file   Highest education level: Associate degree: occupational, Scientist, product/process development, or vocational program  Occupational History   Occupation: Medical laboratory scientific officer: [employer]  Tobacco Use   Smoking status: Every Day    Current packs/day: 0.25    Average packs/day: 0.3 packs/day for 6.0 years (1.5 ttl pk-yrs)    Types: Cigarettes   Smokeless tobacco: Never   Tobacco comments:    4-5 cigs/day  Vaping Use   Vaping status: Never Used  Substance and Sexual Activity   Alcohol use: Yes    Comment: occasionally   Drug use: No   Sexual activity: Not Currently    Birth control/protection: Surgical, Pill    Comment: tubal ligation  Other Topics Concern   Not on file  Social History Narrative   Not on file   Social Drivers of Health   Financial Resource Strain: Medium Risk (06/29/2024)   Overall Financial Resource Strain (CARDIA)    Difficulty of Paying Living Expenses: Somewhat hard  Food Insecurity: No Food Insecurity  (08/30/2024)   Hunger Vital Sign    Worried About Running Out of Food in the Last Year: Never true    Ran Out of Food in the Last Year: Never true  Transportation Needs: No Transportation Needs (08/30/2024)   PRAPARE - Administrator, Civil Service (Medical): No    Lack of Transportation (Non-Medical): No  Physical Activity: Insufficiently Active (06/29/2024)   Exercise Vital Sign    Days of Exercise per Week: 1 day    Minutes of Exercise per Session: 10 min  Stress: Stress Concern Present (08/30/2024)   [employer] of Occupational Health - Occupational Stress Questionnaire    Feeling of Stress: Rather much  Social Connections: Moderately Isolated (08/30/2024)   Social Connection and Isolation Panel    Frequency of Communication with Friends and Family: More than three times a week    Frequency of Social Gatherings with Friends and Family: Once a week    Attends Religious Services: 1 to 4 times per year    Active Member of [employer] or Organizations: No    Attends Banker Meetings: Not on file    Marital Status:  Divorced  Intimate Partner Violence: Not At Risk (08/30/2024)   Humiliation, Afraid, Rape, and Kick questionnaire    Fear of Current or Ex-Partner: No    Emotionally Abused: No    Physically Abused: No    Sexually Abused: No    FAMILY HISTORY: Family History  Problem Relation Age of Onset   Diabetes Mother    Hypertension Mother    Kidney disease Mother    Arthritis Mother    CAD Father    Heart attack Father        died from MI at the age of 55   Heart disease Father    CAD Sister        stents placed at age 70   Asthma Sister    Heart disease Paternal Grandmother    Heart disease Paternal Uncle    Breast cancer Neg Hx    Colon cancer Neg Hx    Colon polyps Neg Hx    Esophageal cancer Neg Hx    Stomach cancer Neg Hx    Rectal cancer Neg Hx     ALLERGIES:  is allergic to influenza vaccines, latex, vyvanse  [lisdexamfetamine  dimesylate], wellbutrin  [bupropion ], hydrocodone , iron , and hydrocodone -acetaminophen .  MEDICATIONS:  Current Outpatient Medications  Medication Sig Dispense Refill   aspirin  EC 81 MG tablet Take 1 tablet (81 mg total) by mouth daily.     desogestrel -ethinyl estradiol  (CYRED EQ ) 0.15-30 MG-MCG tablet Take 1 tablet by mouth daily. 28 tablet 11   levocetirizine (XYZAL ) 5 MG tablet Take 1 tablet (5 mg total) by mouth every evening. (Patient taking differently: Take 5 mg by mouth as needed for allergies.) 90 tablet 1   mometasone  (NASONEX ) 50 MCG/ACT nasal spray One spray in each nostril twice a day, use left hand for right nostril, and right hand for left nostril.  Please dispense one bottle. (Patient taking differently: Place 1 spray into the nose as needed (allergies).) 1 g 6   nebivolol  (BYSTOLIC ) 5 MG tablet Take 1.5 tablets (7.5 mg total) by mouth 2 (two) times daily. (Patient taking differently: Take 5 mg by mouth at bedtime.) 90 tablet 3   SUMAtriptan  (IMITREX ) 100 MG tablet TAKE 1 TABLET (100 MG TOTAL) BY MOUTH ONCE AS NEEDED FOR UP TO 1 DOSE FOR MIGRAINE. MAY REPEAT IN 2 HOURS IF HEADACHE PERSISTS OR RECURS. (Patient taking differently: Take 100 mg by mouth every 2 (two) hours as needed for migraine.) 10 tablet 5   valACYclovir  (VALTREX ) 1000 MG tablet Take 1 tablet (1,000 mg total) by mouth 2 (two) times daily. (Patient taking differently: Take 1,000 mg by mouth as needed (flare ups).) 14 tablet 11   WEGOVY  2.4 MG/0.75ML SOAJ Inject 2.4 mg into the skin once a week. 3 mL 11   No current facility-administered medications for this visit.     PHYSICAL EXAMINATION: ECOG PERFORMANCE STATUS: 1 - Symptomatic but completely ambulatory  Vitals:   08/30/24 1005  BP: 126/73  Pulse: (!) 105  Resp: 18  Temp: 98 F (36.7 C)  SpO2: 100%   Filed Weights   08/30/24 1005  Weight: 167 lb 6.4 oz (75.9 kg)    GENERAL:alert, no distress and comfortable NO cervical or axillary adenopathy CTA  bilaterally RRR Soft, non tender, non distended, no organomegaly No LE edema.   LABORATORY DATA:  I have reviewed the data as listed Lab Results  Component Value Date   WBC 13.5 (H) 07/19/2024   HGB 12.6 07/19/2024   HCT 39.3 07/19/2024  MCV 90 07/19/2024   PLT 276 07/19/2024     Chemistry      Component Value Date/Time   NA 140 07/19/2024 0000   K 3.8 07/19/2024 0000   CL 105 07/19/2024 0000   CO2 19 (L) 07/19/2024 0000   BUN 13 07/19/2024 0000   CREATININE 0.96 07/19/2024 0000   CREATININE 0.79 11/23/2020 0936      Component Value Date/Time   CALCIUM  9.1 07/19/2024 0000   ALKPHOS 73 07/16/2024 1044   AST 14 (L) 07/16/2024 1044   ALT 12 07/16/2024 1044   BILITOT 0.5 07/16/2024 1044   BILITOT 0.2 03/15/2024 1033       RADIOGRAPHIC STUDIES: I have personally reviewed the radiological images as listed and agreed with the findings in the report. No results found.  All questions were answered. The patient knows to call the clinic with any problems, questions or concerns. I spent 45 minutes in the care of this patient including H and P, review of records, counseling and coordination of care.     Amber Stalls, MD 08/30/2024 10:36 AM

## 2024-08-31 ENCOUNTER — Encounter: Payer: Self-pay | Admitting: Physician Assistant

## 2024-09-01 ENCOUNTER — Other Ambulatory Visit (HOSPITAL_BASED_OUTPATIENT_CLINIC_OR_DEPARTMENT_OTHER): Payer: Self-pay

## 2024-09-01 ENCOUNTER — Other Ambulatory Visit: Payer: Self-pay

## 2024-09-01 LAB — ANTINUCLEAR ANTIBODIES, IFA: ANA Ab, IFA: NEGATIVE

## 2024-09-06 ENCOUNTER — Other Ambulatory Visit (HOSPITAL_COMMUNITY): Payer: Self-pay

## 2024-09-06 ENCOUNTER — Encounter: Payer: Self-pay | Admitting: Professional

## 2024-09-06 ENCOUNTER — Ambulatory Visit (INDEPENDENT_AMBULATORY_CARE_PROVIDER_SITE_OTHER): Admitting: Professional

## 2024-09-06 DIAGNOSIS — F3163 Bipolar disorder, current episode mixed, severe, without psychotic features: Secondary | ICD-10-CM

## 2024-09-06 DIAGNOSIS — F4310 Post-traumatic stress disorder, unspecified: Secondary | ICD-10-CM | POA: Diagnosis not present

## 2024-09-06 DIAGNOSIS — F330 Major depressive disorder, recurrent, mild: Secondary | ICD-10-CM

## 2024-09-06 DIAGNOSIS — F411 Generalized anxiety disorder: Secondary | ICD-10-CM
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
## 2024-09-07 ENCOUNTER — Inpatient Hospital Stay (HOSPITAL_BASED_OUTPATIENT_CLINIC_OR_DEPARTMENT_OTHER): Admitting: Hematology and Oncology

## 2024-09-07 ENCOUNTER — Other Ambulatory Visit (HOSPITAL_BASED_OUTPATIENT_CLINIC_OR_DEPARTMENT_OTHER): Payer: Self-pay

## 2024-09-07 DIAGNOSIS — R5383 Other fatigue: Secondary | ICD-10-CM

## 2024-09-07 DIAGNOSIS — D72825 Bandemia: Secondary | ICD-10-CM

## 2024-09-07 DIAGNOSIS — D72829 Elevated white blood cell count, unspecified: Secondary | ICD-10-CM | POA: Diagnosis not present

## 2024-09-07 NOTE — Progress Notes (Signed)
 [facility] CONSULT NOTE  Patient Care Team: Breeback, Jade L, PA-C as PCP - General (Family Medicine) Pietro Redell RAMAN, MD as PCP - Cardiology (Cardiology)  CHIEF COMPLAINTS/PURPOSE OF CONSULTATION:  Abnormal iron  saturation.  ASSESSMENT & PLAN:   Assessment and Plan Assessment & Plan  Mild intermittent leukocytosis. No bandemia or lymphocytosis Technologist smear review is normal. ANA negative. Vit D levels normal. Since leukocytosis is mild, we recommended continued follow up. RTC In 6 months with labs.  IDA,ferritin normal. Continue oral iron  supplementation and we will repeat labs at her next visit.  HISTORY OF PRESENTING ILLNESS:  Kelsey Vaughan 48 y.o. female is here because of abnormal iron  labs.  Discussed the use of AI scribe software for clinical note transcription with the patient, who gave verbal consent to proceed.  History of Present Illness Kelsey Vaughan is a 48 year old female who presents with fatigue and abnormal iron  labs. She was referred by Vermell Epley for evaluation of abnormal iron  labs.  She is here for telephone visit to review labs.  All other systems were reviewed with the patient and are negative.  MEDICAL HISTORY:  Past Medical History:  Diagnosis Date   Allergy    Anxiety    Arthritis    Atrial fibrillation (HCC)    a. occuring in 02/2016   B12 deficiency    Complication of anesthesia    Constipation    Depression    Foreign body in foot, left 07/2013   GERD (gastroesophageal reflux disease)    Hematuria    Irregular menses    due to oral contraceptive   Palpitations    a. 01/2017: Event monitor showing SR with PVC's.    PONV (postoperative nausea and vomiting)    Sinus tachycardia    Thyroid  nodule    Vitamin D  deficiency     SURGICAL HISTORY: Past Surgical History:  Procedure Laterality Date   CESAREAN SECTION     FOREIGN BODY REMOVAL Left 08/05/2013   Procedure: LEFT FOOT FOREIGN BODY REMOVAL  ADULT;  Surgeon: Norleen Armor, MD;  Location: [facility];  Service: Orthopedics;  Laterality: Left;   SHOULDER ARTHROSCOPY Bilateral    TUBAL LIGATION      SOCIAL HISTORY: Social History   Socioeconomic History   Marital status: Divorced    Spouse name: Not on file   Number of children: 3   Years of education: Not on file   Highest education level: Associate degree: occupational, Scientist, product/process development, or vocational program  Occupational History   Occupation: Medical laboratory scientific officer:   Tobacco Use   Smoking status: Every Day    Current packs/day: 0.25    Average packs/day: 0.3 packs/day for 6.0 years (1.5 ttl pk-yrs)    Types: Cigarettes   Smokeless tobacco: Never   Tobacco comments:    4-5 cigs/day  Vaping Use   Vaping status: Never Used  Substance and Sexual Activity   Alcohol use: Yes    Comment: occasionally   Drug use: No   Sexual activity: Not Currently    Birth control/protection: Surgical, Pill    Comment: tubal ligation  Other Topics Concern   Not on file  Social History Narrative   Not on file   Social Drivers of Health   Financial Resource Strain: Medium Risk (06/29/2024)   Overall Financial Resource Strain (CARDIA)    Difficulty of Paying Living Expenses: Somewhat hard  Food Insecurity: No Food Insecurity (08/30/2024)   Hunger Vital  Sign    Worried About Programme researcher, broadcasting/film/video in the Last Year: Never true    Ran Out of Food in the Last Year: Never true  Transportation Needs: No Transportation Needs (08/30/2024)   PRAPARE - Administrator, Civil Service (Medical): No    Lack of Transportation (Non-Medical): No  Physical Activity: Insufficiently Active (06/29/2024)   Exercise Vital Sign    Days of Exercise per Week: 1 day    Minutes of Exercise per Session: 10 min  Stress: Stress Concern Present (08/30/2024)   [employer] of Occupational Health - Occupational Stress Questionnaire    Feeling of Stress: Rather  much  Social Connections: Moderately Isolated (08/30/2024)   Social Connection and Isolation Panel    Frequency of Communication with Friends and Family: More than three times a week    Frequency of Social Gatherings with Friends and Family: Once a week    Attends Religious Services: 1 to 4 times per year    Active Member of [employer] or Organizations: No    Attends Engineer, structural: Not on file    Marital Status: Divorced  Intimate Partner Violence: Not At Risk (08/30/2024)   Humiliation, Afraid, Rape, and Kick questionnaire    Fear of Current or Ex-Partner: No    Emotionally Abused: No    Physically Abused: No    Sexually Abused: No    FAMILY HISTORY: Family History  Problem Relation Age of Onset   Diabetes Mother    Hypertension Mother    Kidney disease Mother    Arthritis Mother    CAD Father    Heart attack Father        died from MI at the age of 57   Heart disease Father    CAD Sister        stents placed at age 19   Asthma Sister    Heart disease Paternal Grandmother    Heart disease Paternal Uncle    Breast cancer Neg Hx    Colon cancer Neg Hx    Colon polyps Neg Hx    Esophageal cancer Neg Hx    Stomach cancer Neg Hx    Rectal cancer Neg Hx     ALLERGIES:  is allergic to influenza vaccines, latex, vyvanse  [lisdexamfetamine dimesylate ], wellbutrin  [bupropion ], hydrocodone , iron , and hydrocodone -acetaminophen .  MEDICATIONS:  Current Outpatient Medications  Medication Sig Dispense Refill   aspirin  EC 81 MG tablet Take 1 tablet (81 mg total) by mouth daily.     desogestrel -ethinyl estradiol  (CYRED EQ ) 0.15-30 MG-MCG tablet Take 1 tablet by mouth daily. 28 tablet 11   levocetirizine (XYZAL ) 5 MG tablet Take 1 tablet (5 mg total) by mouth every evening. (Patient taking differently: Take 5 mg by mouth as needed for allergies.) 90 tablet 1   mometasone  (NASONEX ) 50 MCG/ACT nasal spray One spray in each nostril twice a day, use left hand for right nostril, and  right hand for left nostril.  Please dispense one bottle. (Patient taking differently: Place 1 spray into the nose as needed (allergies).) 1 g 6   nebivolol  (BYSTOLIC ) 5 MG tablet Take 1.5 tablets (7.5 mg total) by mouth 2 (two) times daily. (Patient taking differently: Take 5 mg by mouth at bedtime.) 90 tablet 3   SUMAtriptan  (IMITREX ) 100 MG tablet TAKE 1 TABLET (100 MG TOTAL) BY MOUTH ONCE AS NEEDED FOR UP TO 1 DOSE FOR MIGRAINE. MAY REPEAT IN 2 HOURS IF HEADACHE PERSISTS OR RECURS. (Patient taking differently: Take  100 mg by mouth every 2 (two) hours as needed for migraine.) 10 tablet 5   valACYclovir  (VALTREX ) 1000 MG tablet Take 1 tablet (1,000 mg total) by mouth 2 (two) times daily. (Patient taking differently: Take 1,000 mg by mouth as needed (flare ups).) 14 tablet 11   WEGOVY  2.4 MG/0.75ML SOAJ SQ injection Inject 2.4 mg into the skin once a week. 3 mL 11   No current facility-administered medications for this visit.     PHYSICAL EXAMINATION: ECOG PERFORMANCE STATUS: 1 - Symptomatic but completely ambulatory  There were no vitals filed for this visit.  There were no vitals filed for this visit.  PE deferred, telephone visit   LABORATORY DATA:  I have reviewed the data as listed Lab Results  Component Value Date   WBC 11.7 (H) 08/30/2024   HGB 12.7 08/30/2024   HCT 36.3 08/30/2024   MCV 83.8 08/30/2024   PLT 316 08/30/2024     Chemistry      Component Value Date/Time   NA 140 07/19/2024 0000   K 3.8 07/19/2024 0000   CL 105 07/19/2024 0000   CO2 19 (L) 07/19/2024 0000   BUN 13 07/19/2024 0000   CREATININE 0.96 07/19/2024 0000   CREATININE 0.79 11/23/2020 0936      Component Value Date/Time   CALCIUM  9.1 07/19/2024 0000   ALKPHOS 73 07/16/2024 1044   AST 14 (L) 07/16/2024 1044   ALT 12 07/16/2024 1044   BILITOT 0.5 07/16/2024 1044   BILITOT 0.2 03/15/2024 1033       RADIOGRAPHIC STUDIES: I have personally reviewed the radiological images as listed and  agreed with the findings in the report. No results found.  All questions were answered. The patient knows to call the clinic with any problems, questions or concerns. I spent 10 minutes in the care of this patient including H and P, review of records, counseling and coordination of care.  I connected with  Kelsey Vaughan on 09/07/24 by a telephone application and verified that I am speaking with the correct person using two identifiers.   I discussed the limitations of evaluation and management by telemedicine. The patient expressed understanding and agreed to proceed.  Location of pt: Home Location of provider: office.   Amber Stalls, MD 09/07/2024 4:43 PM

## 2024-09-15 ENCOUNTER — Other Ambulatory Visit (HOSPITAL_BASED_OUTPATIENT_CLINIC_OR_DEPARTMENT_OTHER): Payer: Self-pay

## 2024-09-17 ENCOUNTER — Encounter: Payer: Self-pay | Admitting: Physician Assistant

## 2024-09-17 ENCOUNTER — Ambulatory Visit (INDEPENDENT_AMBULATORY_CARE_PROVIDER_SITE_OTHER): Admitting: Physician Assistant

## 2024-09-17 VITALS — BP 123/85 | HR 107 | Ht 62.0 in | Wt 166.0 lb

## 2024-09-17 DIAGNOSIS — R809 Proteinuria, unspecified: Secondary | ICD-10-CM

## 2024-09-17 DIAGNOSIS — E6609 Other obesity due to excess calories: Secondary | ICD-10-CM

## 2024-09-17 DIAGNOSIS — I10 Essential (primary) hypertension: Secondary | ICD-10-CM

## 2024-09-17 DIAGNOSIS — R5383 Other fatigue: Secondary | ICD-10-CM

## 2024-09-17 DIAGNOSIS — Z91048 Other nonmedicinal substance allergy status: Secondary | ICD-10-CM

## 2024-09-17 DIAGNOSIS — N039 Chronic nephritic syndrome with unspecified morphologic changes: Secondary | ICD-10-CM | POA: Diagnosis not present

## 2024-09-17 DIAGNOSIS — F5101 Primary insomnia: Secondary | ICD-10-CM

## 2024-09-17 DIAGNOSIS — R0683 Snoring: Secondary | ICD-10-CM | POA: Diagnosis not present

## 2024-09-17 DIAGNOSIS — R3129 Other microscopic hematuria: Secondary | ICD-10-CM

## 2024-09-17 DIAGNOSIS — E66811 Obesity, class 1: Secondary | ICD-10-CM

## 2024-09-17 DIAGNOSIS — R002 Palpitations: Secondary | ICD-10-CM

## 2024-09-17 DIAGNOSIS — Z6830 Body mass index (BMI) 30.0-30.9, adult: Secondary | ICD-10-CM | POA: Diagnosis not present

## 2024-09-17 DIAGNOSIS — F3177 Bipolar disorder, in partial remission, most recent episode mixed: Secondary | ICD-10-CM | POA: Diagnosis not present

## 2024-09-17 NOTE — Progress Notes (Signed)
 Established Patient Office Visit  Subjective   Patient ID: Kelsey Vaughan, female    DOB: Oct 09, 1976  Age: 48 y.o. MRN: [id number]  Chief Complaint  Patient presents with   Medical Management of Chronic Issues    HPI Discussed the use of AI scribe software for clinical note transcription with the patient, who gave verbal consent to proceed.  History of Present Illness Kelsey Vaughan is a 48 year old female with hypertension, palpitations and chronic kidney disease who presents for follow-up on her weight management and cardiovascular health.  Weight management and obesity - Weight stable following prior weight loss from 187 to 166.  - Currently taking Wegovy  for weight management - Received notification that insurance will no longer cover weight loss medications unless deemed necessary for other health conditions - Engaging in regular exercise  Hypertension, Palpitations and cardiovascular health - History of hypertension, on bystolic  daily - No recent palpitations except during episodes of anxiety - Attempted to use a heart monitor but discontinued due to skin irritation - Requests new referral to cardiology as previous cardiologist retired  Chronic kidney disease and glomerular condition - Chronic glomerular kidney disease, GFR above 60 with persistent hematuria and proteinuria in dipstick - No nephrology follow-up in over three years  Iron  deficiency and supplementation - Improvement in iron  levels after starting daily over-the-counter iron  supplements - Did not receive iron  infusion from hematology  Sleep disturbance and sleep apnea evaluation - No recent sleep study since 2018 despite previous order - Interested in completing a home sleep study  Mental health and psychotherapy  - Bipolar 1 and not on medication - Attending therapy sessions since February - Initially stopped therapy six months ago but resumed after an episode of frustration - Plans to continue  therapy as long as insurance approves    ROS See HPI.    Objective:     BP 123/85   Pulse (!) 107   Ht 5' 2 (1.575 m)   Wt 166 lb (75.3 kg)   SpO2 99%   BMI 30.36 kg/m  BP Readings from Last 3 Encounters:  09/17/24 123/85  08/30/24 126/73  07/19/24 116/77   Wt Readings from Last 3 Encounters:  09/17/24 166 lb (75.3 kg)  08/30/24 167 lb 6.4 oz (75.9 kg)  07/19/24 171 lb (77.6 kg)      Physical Exam Constitutional:      Appearance: Normal appearance. She is obese.  HENT:     Head: Normocephalic.  Cardiovascular:     Rate and Rhythm: Normal rate and regular rhythm.     Pulses: Normal pulses.     Heart sounds: Normal heart sounds.  Pulmonary:     Effort: Pulmonary effort is normal.     Breath sounds: Normal breath sounds.  Musculoskeletal:     Right lower leg: No edema.     Left lower leg: No edema.  Neurological:     General: No focal deficit present.     Mental Status: She is alert and oriented to person, place, and time.  Psychiatric:        Mood and Affect: Mood normal.      The 10-year ASCVD risk score (Arnett DK, et al., 2019) is: 5.5%    Assessment & Plan:  .[name] was seen today for medical management of chronic issues.  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 Last nephrology visit was over three years ago. - Refer to nephrologist at [facility] for follow-up.  Palpitations Palpitations are well-controlled with no recent episodes except when associated with anxiety. Previous heart monitor use was unsuccessful due to skin irritation. - Refer to cardiologist, preferably Dr. Pietro, for further evaluation.  Hypertension Hypertension is well-controlled with current medication regimen.  Hyperlipidemia Hyperlipidemia is managed as part of cardiovascular risk reduction.  Obesity Obesity is managed with Wegovy  and exercise. Insurance coverage for Wegovy  has been discontinued unless justified for cardiovascular risk reduction. - down 21lbs on wegovy  - Submit a new request for preapproval of Wegovy , citing cardiovascular benefits and existing health conditions.  Iron  deficiency on supplementation Iron  deficiency is improving with over-the-counter iron  supplements. No recent iron  infusions as hematology did not find it necessary.  General Health Maintenance - Order home sleep study. - Check recent kidney showed GFR above 60.      Zubair Lofton, PA-C

## 2024-09-20 ENCOUNTER — Ambulatory Visit: Admitting: Professional

## 2024-09-20 ENCOUNTER — Encounter: Payer: Self-pay | Admitting: Professional

## 2024-09-20 ENCOUNTER — Encounter: Payer: Self-pay | Admitting: Physician Assistant

## 2024-09-20 DIAGNOSIS — F3163 Bipolar disorder, current episode mixed, severe, without psychotic features: Secondary | ICD-10-CM

## 2024-09-20 DIAGNOSIS — F411 Generalized anxiety disorder: Secondary | ICD-10-CM | POA: Diagnosis not present

## 2024-09-20 DIAGNOSIS — F4310 Post-traumatic stress disorder, unspecified: Secondary | ICD-10-CM | POA: Diagnosis not present

## 2024-09-20 DIAGNOSIS — F330 Major depressive disorder, recurrent, mild: Secondary | ICD-10-CM

## 2024-09-20 DIAGNOSIS — Z91048 Other nonmedicinal substance allergy status: Secondary | ICD-10-CM | POA: Insufficient documentation

## 2024-09-20 DIAGNOSIS — I10 Essential (primary) hypertension: Secondary | ICD-10-CM | POA: Insufficient documentation

## 2024-09-20 NOTE — Progress Notes (Signed)
 [facility] Counselor/Therapist Progress Note  Patient ID: Kelsey Vaughan, MRN: [id number],    Date: 09/20/2024  Time Spent: 19  minutes 102-121pm  Treatment Type: Individual therapy  Risk Assessment: Danger to Self:  No Self-injurious Behavior: No Danger to Others: No  Subjective: This session was held via video teletherapy. The patient consented to video teletherapy and was located at her vehicle during this session. She is aware it is the responsibility of the patient to secure confidentiality on her end of the session. The provider was in a private home office for the duration of this session.    The patient arrived on time for her Caregility session.  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 -she is hopeful to get up and running FT so she doesn't have to move locations -she thinks that she and the MD will be a great fit -they appear to have similar work habits -she is still very positive about her job -she has been working or about six weeks and it's not really bad and this girl been doing the same thing since I got here 3-personal a-pt feels she is in a much better space than she was when she first started -she admits that June and July were tough but she still remained committed to herself -she a work in progress however not the way I used to be b-she still feels distanced from her bff and will wait for her to reach out -she I not willing to permit negativity in her life c-she did reach out to another friend that she had not spoken with in at least three  years -pt had backed out of relationship with no explanation -pt admits that pt thought she cut her off when the pt is the one that really did so -she met with her at her home last weekend -pt admits that she has not been the best friend and knowing that friend was in need so she went to her home and visited for hours and is texting her every morning to support  Treatment Plan Problems: Bipolar Disorder - Mania, Childhood Trauma, Sleep Disturbance, Anger Control Problems Symptoms: Shows a pattern of episodic excessive anger in response to specific situations or situational themes. Shows direct or indirect evidence of physiological arousal related to anger. Reports a history of explosive, aggressive outbursts out of proportion with any precipitating stressors, leading to verbal attacks, assaultive acts, or destruction of property. Displays overreactive verbal hostility to insignificant irritants. Makes swift and harsh judgmental statements to or about others. Displays body language suggesting anger, including tense muscles (e.g., clenched fist or jaw), glaring looks, or refusal to make eye contact. Shows passive-aggressive patterns (e.g., social withdrawal, lack of complete or timely compliance in following directions or rules, complaining about authority figures behind their backs, uncooperative in meeting expected behavioral norms) due to anger. Passively withholds feelings and then explodes in a rage. Demonstrates an angry overreaction to perceived disapproval, rejection, or criticism. Uses abusive language meant to intimidate others. Rationalizes and blames others for aggressive and abusive behavior. Uses aggression as a means  of achieving power and control. Exhibits an abnormally and persistently elevated, expansive, or irritable mood with at least three symptoms of mania (i.e., inflated self-esteem or grandiosity, decreased need for sleep, pressured speech, flight of ideas, distractibility,  excessive goal-directed activity or psychomotor agitation, excessive involvement in pleasurable, high-risk behavior). The elevated mood or irritability (mania) causes marked impairment in occupational functioning, social activities, or relationships with others. Reports flight of ideas or thoughts racing. Verbalizes grandiose ideas and/or persecutory beliefs. Shows evidence of a decreased need for sleep. Reports little or no appetite. Exhibits increased motor activity or agitation. Displays a poor attention span and is easily distracted. Loss of normal inhibition leads to impulsive and excessive pleasure-oriented behavior without regard for painful consequences. Exhibits an expansive mood that can easily turn to impatience and irritable anger if goal-oriented behavior is blocked or confronted. Lacks follow-through in projects, even though energy is very high, since behavior lacks discipline and goal-directedness. Reports of childhood physical, sexual, and/or emotional abuse. Description of parents as physically or emotionally neglectful as they were chemically dependent, too busy, absent, etc. Description of childhood as chaotic as parent(s) was substance abuser (or mentally ill, antisocial, etc.), leading to frequent moves, multiple abusive spousal partners, frequent substitute caretakers, financial pressures, and/or many stepsiblings. Irrational fears, suppressed rage, low self-esteem, identity conflicts, depression, or anxious insecurity related to painful early life experiences. Complains of difficulty falling asleep. Complains of difficulty remaining asleep. Insomnia or hypersomnia complaints due to a reversal of the normal sleep-wake schedule. Goals: Learn and implement anger management skills to reduce the level of anger and irritability that accompanies it. Increase respectful communication through the use of assertiveness and conflict resolution skills. Develop an awareness of angry  thoughts, feelings, and actions, clarifying origins of, and learning alternatives to aggressive anger. Decrease the frequency, intensity, and duration of angry thoughts, feelings, and actions and increase the ability to recognize and respectfully express frustration and resolve conflict. Implement cognitive behavioral skills necessary to solve problems in a more constructive manner. Come to an awareness and acceptance of angry feelings while developing better control and more serenity. Become capable of handling angry feelings in constructive ways that enhance daily functioning. Demonstrate respect for others and their feelings. Alleviate manic/hypomanic mood and return to previous level of effective functioning. Normalize energy level and return to usual activities, good judgment, stable mood, more realistic expectations, and goal-directed behavior. Reduce agitation, impulsivity, and pressured speech while achieving sensitivity to the consequences of behavior and having more realistic expectations. Achieve controlled behavior, moderated mood, more deliberative speech and thought process, and a stable daily activity pattern. Develop healthy cognitive patterns and beliefs about self and the world that lead to alleviation and help prevent the relapse of manic/hypomanic episodes. Talk about underlying feelings of low self-esteem or guilt and fears of rejection, dependency, and abandonment. Develop an awareness of how childhood issues have affected and continue to affect one's family life. Resolve past childhood/family issues, leading to less anger and depression, greater self-esteem, security, and confidence. Release the emotions associated with past childhood/family issues, resulting in less resentment and more serenity. Let go of blame and begin to forgive others for pain caused in childhood. Restore restful sleep pattern. Feel refreshed and energetic during wakeful hours. Objectives target date for  all objectives is 02/16/2025: Keep a daily journal of persons, situations, and other triggers of anger; record thoughts, feelings, and actions taken. Verbalize increased awareness of anger expression patterns, their causes, and their consequences. Learn and implement calming and  coping strategies as part of an overall approach to managing anger. Identify, challenge, and replace anger-inducing self-talk with self-talk that facilitates a less angry reaction. Learn and implement thought-stopping to manage intrusive unwanted thoughts that trigger anger. Verbalize an understanding of assertive communication and how it can be used to express thoughts and feelings of anger in a controlled, respectful way. Describe mood state, energy level, amount of control over thoughts, and sleeping pattern. Identify and replace thoughts and behaviors that trigger manic or depressive symptoms. Differentiate between real and imagined losses, rejections, and abandonments. Verbalize grief, fear, and anger regarding real or imagined losses in life. Acknowledge the low self-esteem and fear of rejection that underlie the braggadocio. Describe what it was like to grow up in the home environment. Describe each family member and identify the role each played within the family. Identify patterns of abuse, neglect, or abandonment within the family of origin, both current and historical, nuclear and extended. Identify feelings associated with major traumatic incidents in childhood and with parental child-rearing patterns. Identify how own parenting has been influenced by childhood experiences. Identify the positive aspects for self of being able to forgive all those involved with the abuse. Describe the history and details of sleep pattern. Verbalize depressive or anxious feelings and share possible causes. Verbalize an understanding of normal sleep, sleep disturbances, and their treatment. Learn and implement calming skills for  use at bedtime. Practice good sleep hygiene. Learn and implement stimulus control strategies to establish a consistent sleep-wake rhythm. Learn and implement a sleep restriction method to increase sleep efficiency. Learn and implement skills for managing stresses contributing to the sleep problem. Interventions: Teach the client calming techniques (e.g., progressive muscle relaxation, breathing induced relaxation, calming imagery, cue-controlled relaxation, applied relaxation, mindful breathing) as part of a tailored strategy for reducing chronic and acute physiological tension that accompanies the escalation of his/her angry feelings. Explore the client's self-talk that mediates his/her angry feelings and actions (e.g., demanding expectations reflected in should, must, or have-to statements); identify and challenge biases, assisting him/her in generating appraisals and self-talk that corrects for the biases and facilitates a more flexible and temperate response to frustration. Combine new self-talk with calming skills as part of a set of coping skills to manage anger. Role-play the use of relaxation and cognitive coping to visualized anger-provoking scenes, moving from low- to high-anger scenes. Assign the implementation of calming techniques in his/her daily life and when facing anger-triggering situations; process the results, reinforcing success and problem-solving obstacles. Assign the client to implement a thought-stopping technique in which he/she shouts STOP to himself/herself in his/her mind and then replaces the thought with an alternative that is calming (or assign Making Use of the Thought-Stopping Technique in the Adult Psychotherapy Homework Planner by [facility]); review implantation, reinforcing success and providing corrective feedback for failure. Use instruction, modeling, and/or role-playing to teach the client the distinctive elements as well as the pros and cons of assertive,  unassertive (passive), and aggressive communication. Ask the client to self-monitor, keeping a daily journal in which he/she documents persons, situations, thoughts, feelings, and actions associated with moments of anger, irritation, or disappointment (or assign Anger Journal in the Adult Psychotherapy Homework Planner by Jenniffer); routinely process the journal toward helping the client understand his/her contributions to generating his/her anger. Assist the client in generating a list of anger triggers; process the list toward helping the client understand the causes and expressions of his/her anger. Assist the client in re-conceptualizing anger as involving different dimensions (cognitive,  physiological, affective, and behavioral) that interact predictably (e.g., demanding expectations not being met leading to increased arousal and anger leading to acting out) and that can be understood, challenged, and changed. Process the client's list of anger triggers and other relevant journal information toward helping the client understand how cognitive, physiological, and affective factors interplay to produce anger. Ask the client to list and discuss ways anger has negatively impacted his/her daily life (e.g., hurting others or self, legal conflicts, loss of respect from self and others, destruction of property); process this list. Assist the client in identifying the positive consequences of managing anger (e.g., respect from others and self, cooperation from others, improved physical health, etc.) (or assign Alternatives to Destructive Anger in the Adult Psychotherapy Homework Planner by Jenniffer). Assess the client's sleep history including sleep pattern, bedtime routine, activities associated  with the bed, activity level while awake, nutritional habits including stimulant use, napping practice, actual sleep time, rhythm of time for being awake versus sleeping, and so on. Teach the client stimulus control  techniques (e.g., lie down to sleep only when sleepy; do not use the bed for activities like watching television, reading, listening to music, but only for sleep or sexual activity; get out of bed if sleep doesn't arrive soon after retiring; lie back down when sleepy; set alarm to the same wake-up time every morning regardless of sleep time or quality; do not nap during the day); assign consistent implementation. Instruct the client to move activities associated with arousal and activation from the bedtime ritual to other times during the day (e.g., reading stimulating content, reviewing day's events, planning for next day, watching disturbing television). Use cognitive behavioral skills training techniques (e.g., instruction, covert modeling [i.e., imagining the successful use of the strategies], role-play, practice, and generalization training) to teach the client tailored skills (e.g., calming and coping skills, conflict-resolution, problem-solving) for managing stressors related to the sleep disturbance (e.g., interpersonal conflicts that carry over and cause nighttime wakefulness); routinely review, reinforce successes, problem-solve obstacles toward effective everyday use (see Insomnia: A Clinical Guide to Assessment and Treatment by Morin and Espie; Treating Sleep Disorders by Michaeleen Pay and Lichstein). Assess the role of depression or anxiety as the cause of the client's sleep disturbance (see the Unipolar Depression or Anxiety chapters in this Planner). Provide the client with basic sleep education (e.g., normal length of sleep, normal variations of sleep, normal time to fall asleep, and normal midnight awakening; recommend The Insomnia Workbook: A Comprehensive Guide to Getting the Sleep You Need by Silberman); help the client understand the exact nature of his/her abnormal sleeping pattern. Teach the client relaxation skills (e.g., progressive muscle relaxation, guided imagery, slow  diaphragmatic breathing); teach the client how to apply these skills to facilitate relaxation and sleep at bedtime (see Bedtime Relaxation Techniques by Otto armin Carbon). Instruct the client in sleep hygiene practices such as restricting excessive liquid intake, spicy late night snacks, or heavy evening meals; exercising regularly, but not within 3 - 4 hours of bedtime; minimizing or avoiding caffeine, alcohol, tobacco, and stimulant intake (or assign Sleep Pattern Record in the Adult Psychotherapy Homework Planner by [facility]). Encourage the client to share his/her thoughts and feelings; express empathy, and build rapport while assessing primary cognitive, behavioral, interpersonal, or other symptoms of the mood disorder. Assess presence, severity, and impact of past and present mood episodes including mania (i.e., pressured speech, impulsive behavior, euphoric mood, flight of ideas, reduced need for sleep, inflated self-esteem, and high energy) on social, occupational, and  interpersonal functioning; supplement with semi-structured inventory, if desired (e.g., Young Mania Rating Scale; the Clinical Monitoring Form); re-administer as indicated to assess treatment response. Use cognitive therapy techniques to explore and educate the client's about cognitive biases that trigger his/her elevated or depressive mood (see Cognitive Therapy for Bipolar Disorder by Lizette dunker al.). Teach the client cognitive behavioral coping and relapse prevention skills including delaying impulsive actions, structured scheduling of daily activities, keeping a regular sleep routine, avoiding unrealistic goal striving, using relaxation procedures, identifying and avoiding episode triggers such as stimulant drug use, alcohol consumption, breaking sleep routine, or exposing self to high stress (see Cognitive Therapy for Bipolar Disorder by Lizette dunker al.). Pledge to be there consistently to help, listen to, and support the client. Explore  the client's fears of abandonment by sources of love and nurturance. Help the client differentiate between real and imagined, actual and exaggerated losses. Probe real or perceived losses in the client's life. Review ways for the client to replace the losses and put them in perspective. Probe the causes for the client's low self-esteem and abandonment fears in the family-of-origin history. Develop the client's family genogram and/or symptom line and help identify patterns of dysfunction within the family. Assign the client to write a forgiveness letter to the perpetrator of abuse (or assign Feelings and Forgiveness Letter in the Adult Psychotherapy Homework Planner by Jenniffer); process the letter. Teach the client the benefits (i.e., release of hurt and anger, putting issue in the past, opens door for trust of others, etc.) of beginning a process of forgiveness of (not necessarily forgetting or fraternizing with) abusive adults. Assist the client in clarifying his/her role within the family and his/her feelings connected to that role. Explore the client's painful childhood experiences (or assign Share the Painful Memory in the Adult Psychotherapy Homework Planner by Jenniffer). Support and encourage the client when he/she begins to express feelings of rage, sadness, fear, and rejection relating to family abuse or neglect. Assign the client to record feelings in a journal that describes memories, behavior, and emotions tied to his/her traumatic childhood experiences (or assign How the Trauma Affects Me in the Adult Psychotherapy Homework Planner by Jenniffer). Ask the client to compare his/her parenting behavior to that of parent figures of his/her childhood; encourage the client to be aware of how easily we repeat patterns that we grew up with.   Diagnosis:Bipolar 1 disorder, mixed, severe (HCC)  MDD (major depressive disorder), recurrent episode, mild  GAD (generalized anxiety disorder)  PTSD  (post-traumatic stress disorder)  R/O Bipolar Disorder  Plan:  -meet again on Monday, October 04, 2024 at 1pm

## 2024-09-21 ENCOUNTER — Other Ambulatory Visit (HOSPITAL_BASED_OUTPATIENT_CLINIC_OR_DEPARTMENT_OTHER): Payer: Self-pay

## 2024-09-21 ENCOUNTER — Encounter: Payer: Self-pay | Admitting: Physician Assistant

## 2024-09-21 DIAGNOSIS — E781 Pure hyperglyceridemia: Secondary | ICD-10-CM

## 2024-09-21 DIAGNOSIS — E7800 Pure hypercholesterolemia, unspecified: Secondary | ICD-10-CM

## 2024-09-21 DIAGNOSIS — E6609 Other obesity due to excess calories: Secondary | ICD-10-CM

## 2024-09-21 DIAGNOSIS — R7303 Prediabetes: Secondary | ICD-10-CM

## 2024-09-21 MED ORDER — WEGOVY 2.4 MG/0.75ML ~~LOC~~ SOAJ
2.4000 mg | SUBCUTANEOUS | 11 refills | Status: DC
  Filled 2024-09-21 – 2024-09-30 (×3): qty 3, 28d supply, fill #0

## 2024-09-22 ENCOUNTER — Other Ambulatory Visit: Payer: Self-pay

## 2024-09-22 ENCOUNTER — Other Ambulatory Visit (HOSPITAL_BASED_OUTPATIENT_CLINIC_OR_DEPARTMENT_OTHER): Payer: Self-pay

## 2024-09-30 ENCOUNTER — Other Ambulatory Visit (HOSPITAL_BASED_OUTPATIENT_CLINIC_OR_DEPARTMENT_OTHER): Payer: Self-pay

## 2024-10-01 ENCOUNTER — Other Ambulatory Visit (HOSPITAL_BASED_OUTPATIENT_CLINIC_OR_DEPARTMENT_OTHER): Payer: Self-pay

## 2024-10-04 ENCOUNTER — Other Ambulatory Visit (HOSPITAL_BASED_OUTPATIENT_CLINIC_OR_DEPARTMENT_OTHER): Payer: Self-pay

## 2024-10-04 ENCOUNTER — Ambulatory Visit: Admitting: Professional

## 2024-10-04 ENCOUNTER — Telehealth: Payer: Self-pay

## 2024-10-04 ENCOUNTER — Encounter: Payer: Self-pay | Admitting: Professional

## 2024-10-04 ENCOUNTER — Other Ambulatory Visit (HOSPITAL_COMMUNITY): Payer: Self-pay

## 2024-10-04 ENCOUNTER — Encounter: Payer: Self-pay | Admitting: Physician Assistant

## 2024-10-04 DIAGNOSIS — F4310 Post-traumatic stress disorder, unspecified: Secondary | ICD-10-CM

## 2024-10-04 DIAGNOSIS — F3163 Bipolar disorder, current episode mixed, severe, without psychotic features: Secondary | ICD-10-CM

## 2024-10-04 DIAGNOSIS — F411 Generalized anxiety disorder: Secondary | ICD-10-CM | POA: Diagnosis not present

## 2024-10-04 DIAGNOSIS — F330 Major depressive disorder, recurrent, mild: Secondary | ICD-10-CM

## 2024-10-04 NOTE — Telephone Encounter (Signed)
 Pharmacy Patient Advocate Encounter  Effective October 1st, [place] discontinued coverage of GLP1 medications for weight loss (such as Wegovy  and Zepbound), unless the patient has a documented history of a heart attack or stroke. Zepbound will continue to be covered only for patients with moderate to severe sleep apnea (AHI 15-30) and a BMI greater than 40. Because of this change, the prior authorization team will not be submitting new PA requests for GLP1 medications prescribed for weight loss, as patients will be unable to continue therapy under Medicaid coverage.

## 2024-10-04 NOTE — Progress Notes (Signed)
 [facility] Counselor/Therapist Progress Note  Patient ID: Kelsey Vaughan, MRN: [id number],    Date: 10/04/2024  Time Spent: 24  minutes 101-125pm  Treatment Type: Individual therapy  Risk Assessment: Danger to Self:  No Self-injurious Behavior: No Danger to Others: No  Subjective: This session was held via video teletherapy. The patient consented to video teletherapy and was located at her vehicle during this session. She is aware it is the responsibility of the patient to secure confidentiality on her end of the session. The provider was in a private home office for the duration of this session.    The patient arrived on time for her Caregility session.  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 -he is mellow -they have a good relationship -they worked together last Thursday and had a great day -she has been working increased hours but is able to leave once she meets her 40 hrs 2-personal -I'm in a great space right now, definitely not where I was in the very beginning -sh is using her coping skills to help her through the difficult moments 3-social -starting to be more social with her friend -she is going out on the weekend -she prefers to be at home and in her peaceful place -pt wants to socialize some -pt has financial goals she wants to get accomplished -pt wants to stay on track with her chores and her self-care -pt thinks she can handle planning two Saturdays per month and be more flexible with the other weekends  -usually Sunday is her cooking and day to prepare to return to work -Saturday has been her clean up day -pt notices she is wanting to go home fro social events earlier than the past 4-anger -she has been doing well -she cannot recall the last time she  was angry; it hasn't been recent -asked pt to identify her triggers and will need to consider -pt admits thinking logically about is it worth getting upset, is it something small/big for me to get upset -admits importance of her to stop, think, pause -she has been in situations where other people are getting upset and she remains calm -pt provided example at number of pts needing to be checking in and she was the person who reminded staff that this too shall pass  Treatment Plan Problems: Bipolar Disorder - Mania, Childhood Trauma, Sleep Disturbance, Anger Control Problems Symptoms: Shows a pattern of episodic excessive anger in response to specific situations or situational themes. Shows direct or indirect evidence of physiological arousal related to anger. Reports a history of explosive, aggressive outbursts out of proportion with any precipitating stressors, leading to verbal attacks, assaultive acts, or destruction of property. Displays overreactive verbal hostility to insignificant irritants. Makes swift and harsh judgmental statements to or about others. Displays body language suggesting anger, including tense muscles (e.g., clenched fist or jaw), glaring looks, or refusal to make eye contact. Shows passive-aggressive patterns (e.g., social withdrawal, lack of complete or timely compliance in following directions or rules, complaining about authority figures behind their backs, uncooperative in meeting expected behavioral norms) due to anger. Passively withholds feelings and then explodes in a rage. Demonstrates an angry overreaction to perceived disapproval, rejection, or criticism. Uses abusive language meant to intimidate others. Rationalizes and blames others for aggressive and abusive behavior. Uses aggression as  a means of achieving power and control. Exhibits an abnormally and persistently elevated, expansive, or irritable mood with at least three symptoms of mania (i.e.,  inflated self-esteem or grandiosity, decreased need for sleep, pressured speech, flight of ideas, distractibility, excessive goal-directed activity or psychomotor agitation, excessive involvement in pleasurable, high-risk behavior). The elevated mood or irritability (mania) causes marked impairment in occupational functioning, social activities, or relationships with others. Reports flight of ideas or thoughts racing. Verbalizes grandiose ideas and/or persecutory beliefs. Shows evidence of a decreased need for sleep. Reports little or no appetite. Exhibits increased motor activity or agitation. Displays a poor attention span and is easily distracted. Loss of normal inhibition leads to impulsive and excessive pleasure-oriented behavior without regard for painful consequences. Exhibits an expansive mood that can easily turn to impatience and irritable anger if goal-oriented behavior is blocked or confronted. Lacks follow-through in projects, even though energy is very high, since behavior lacks discipline and goal-directedness. Reports of childhood physical, sexual, and/or emotional abuse. Description of parents as physically or emotionally neglectful as they were chemically dependent, too busy, absent, etc. Description of childhood as chaotic as parent(s) was substance abuser (or mentally ill, antisocial, etc.), leading to frequent moves, multiple abusive spousal partners, frequent substitute caretakers, financial pressures, and/or many stepsiblings. Irrational fears, suppressed rage, low self-esteem, identity conflicts, depression, or anxious insecurity related to painful early life experiences. Complains of difficulty falling asleep. Complains of difficulty remaining asleep. Insomnia or hypersomnia complaints due to a reversal of the normal sleep-wake schedule. Goals: Learn and implement anger management skills to reduce the level of anger and irritability that accompanies it. Increase  respectful communication through the use of assertiveness and conflict resolution skills. Develop an awareness of angry thoughts, feelings, and actions, clarifying origins of, and learning alternatives to aggressive anger. Decrease the frequency, intensity, and duration of angry thoughts, feelings, and actions and increase the ability to recognize and respectfully express frustration and resolve conflict. Implement cognitive behavioral skills necessary to solve problems in a more constructive manner. Come to an awareness and acceptance of angry feelings while developing better control and more serenity. Become capable of handling angry feelings in constructive ways that enhance daily functioning. Demonstrate respect for others and their feelings. Alleviate manic/hypomanic mood and return to previous level of effective functioning. Normalize energy level and return to usual activities, good judgment, stable mood, more realistic expectations, and goal-directed behavior. Reduce agitation, impulsivity, and pressured speech while achieving sensitivity to the consequences of behavior and having more realistic expectations. Achieve controlled behavior, moderated mood, more deliberative speech and thought process, and a stable daily activity pattern. Develop healthy cognitive patterns and beliefs about self and the world that lead to alleviation and help prevent the relapse of manic/hypomanic episodes. Talk about underlying feelings of low self-esteem or guilt and fears of rejection, dependency, and abandonment. Develop an awareness of how childhood issues have affected and continue to affect one's family life. Resolve past childhood/family issues, leading to less anger and depression, greater self-esteem, security, and confidence. Release the emotions associated with past childhood/family issues, resulting in less resentment and more serenity. Let go of blame and begin to forgive others for pain caused in  childhood. Restore restful sleep pattern. Feel refreshed and energetic during wakeful hours. Objectives target date for all objectives is 02/16/2025: Keep a daily journal of persons, situations, and other triggers of anger; record thoughts, feelings, and actions taken. Verbalize increased awareness of anger expression patterns, their causes, and their consequences. Learn and implement  calming and coping strategies as part of an overall approach to managing anger. Identify, challenge, and replace anger-inducing self-talk with self-talk that facilitates a less angry reaction. Learn and implement thought-stopping to manage intrusive unwanted thoughts that trigger anger. Verbalize an understanding of assertive communication and how it can be used to express thoughts and feelings of anger in a controlled, respectful way. Describe mood state, energy level, amount of control over thoughts, and sleeping pattern. Identify and replace thoughts and behaviors that trigger manic or depressive symptoms. Differentiate between real and imagined losses, rejections, and abandonments. Verbalize grief, fear, and anger regarding real or imagined losses in life. Acknowledge the low self-esteem and fear of rejection that underlie the braggadocio. Describe what it was like to grow up in the home environment. Describe each family member and identify the role each played within the family. Identify patterns of abuse, neglect, or abandonment within the family of origin, both current and historical, nuclear and extended. Identify feelings associated with major traumatic incidents in childhood and with parental child-rearing patterns. Identify how own parenting has been influenced by childhood experiences. Identify the positive aspects for self of being able to forgive all those involved with the abuse. Describe the history and details of sleep pattern. Verbalize depressive or anxious feelings and share possible  causes. Verbalize an understanding of normal sleep, sleep disturbances, and their treatment. Learn and implement calming skills for use at bedtime. Practice good sleep hygiene. Learn and implement stimulus control strategies to establish a consistent sleep-wake rhythm. Learn and implement a sleep restriction method to increase sleep efficiency. Learn and implement skills for managing stresses contributing to the sleep problem. Interventions: Teach the client calming techniques (e.g., progressive muscle relaxation, breathing induced relaxation, calming imagery, cue-controlled relaxation, applied relaxation, mindful breathing) as part of a tailored strategy for reducing chronic and acute physiological tension that accompanies the escalation of his/her angry feelings. Explore the client's self-talk that mediates his/her angry feelings and actions (e.g., demanding expectations reflected in should, must, or have-to statements); identify and challenge biases, assisting him/her in generating appraisals and self-talk that corrects for the biases and facilitates a more flexible and temperate response to frustration. Combine new self-talk with calming skills as part of a set of coping skills to manage anger. Role-play the use of relaxation and cognitive coping to visualized anger-provoking scenes, moving from low- to high-anger scenes. Assign the implementation of calming techniques in his/her daily life and when facing anger-triggering situations; process the results, reinforcing success and problem-solving obstacles. Assign the client to implement a thought-stopping technique in which he/she shouts STOP to himself/herself in his/her mind and then replaces the thought with an alternative that is calming (or assign Making Use of the Thought-Stopping Technique in the Adult Psychotherapy Homework Planner by [facility]); review implantation, reinforcing success and providing corrective feedback for failure. Use  instruction, modeling, and/or role-playing to teach the client the distinctive elements as well as the pros and cons of assertive, unassertive (passive), and aggressive communication. Ask the client to self-monitor, keeping a daily journal in which he/she documents persons, situations, thoughts, feelings, and actions associated with moments of anger, irritation, or disappointment (or assign Anger Journal in the Adult Psychotherapy Homework Planner by Jenniffer); routinely process the journal toward helping the client understand his/her contributions to generating his/her anger. Assist the client in generating a list of anger triggers; process the list toward helping the client understand the causes and expressions of his/her anger. Assist the client in re-conceptualizing anger as involving different  dimensions (cognitive, physiological, affective, and behavioral) that interact predictably (e.g., demanding expectations not being met leading to increased arousal and anger leading to acting out) and that can be understood, challenged, and changed. Process the client's list of anger triggers and other relevant journal information toward helping the client understand how cognitive, physiological, and affective factors interplay to produce anger. Ask the client to list and discuss ways anger has negatively impacted his/her daily life (e.g., hurting others or self, legal conflicts, loss of respect from self and others, destruction of property); process this list. Assist the client in identifying the positive consequences of managing anger (e.g., respect from others and self, cooperation from others, improved physical health, etc.) (or assign Alternatives to Destructive Anger in the Adult Psychotherapy Homework Planner by Jenniffer). Assess the client's sleep history including sleep pattern, bedtime routine, activities associated  with the bed, activity level while awake, nutritional habits including stimulant use,  napping practice, actual sleep time, rhythm of time for being awake versus sleeping, and so on. Teach the client stimulus control techniques (e.g., lie down to sleep only when sleepy; do not use the bed for activities like watching television, reading, listening to music, but only for sleep or sexual activity; get out of bed if sleep doesn't arrive soon after retiring; lie back down when sleepy; set alarm to the same wake-up time every morning regardless of sleep time or quality; do not nap during the day); assign consistent implementation. Instruct the client to move activities associated with arousal and activation from the bedtime ritual to other times during the day (e.g., reading stimulating content, reviewing day's events, planning for next day, watching disturbing television). Use cognitive behavioral skills training techniques (e.g., instruction, covert modeling [i.e., imagining the successful use of the strategies], role-play, practice, and generalization training) to teach the client tailored skills (e.g., calming and coping skills, conflict-resolution, problem-solving) for managing stressors related to the sleep disturbance (e.g., interpersonal conflicts that carry over and cause nighttime wakefulness); routinely review, reinforce successes, problem-solve obstacles toward effective everyday use (see Insomnia: A Clinical Guide to Assessment and Treatment by Morin and Espie; Treating Sleep Disorders by Michaeleen Pay and Lichstein). Assess the role of depression or anxiety as the cause of the client's sleep disturbance (see the Unipolar Depression or Anxiety chapters in this Planner). Provide the client with basic sleep education (e.g., normal length of sleep, normal variations of sleep, normal time to fall asleep, and normal midnight awakening; recommend The Insomnia Workbook: A Comprehensive Guide to Getting the Sleep You Need by Silberman); help the client understand the exact nature of his/her  abnormal sleeping pattern. Teach the client relaxation skills (e.g., progressive muscle relaxation, guided imagery, slow diaphragmatic breathing); teach the client how to apply these skills to facilitate relaxation and sleep at bedtime (see Bedtime Relaxation Techniques by Otto armin Carbon). Instruct the client in sleep hygiene practices such as restricting excessive liquid intake, spicy late night snacks, or heavy evening meals; exercising regularly, but not within 3 - 4 hours of bedtime; minimizing or avoiding caffeine, alcohol, tobacco, and stimulant intake (or assign Sleep Pattern Record in the Adult Psychotherapy Homework Planner by [facility]). Encourage the client to share his/her thoughts and feelings; express empathy, and build rapport while assessing primary cognitive, behavioral, interpersonal, or other symptoms of the mood disorder. Assess presence, severity, and impact of past and present mood episodes including mania (i.e., pressured speech, impulsive behavior, euphoric mood, flight of ideas, reduced need for sleep, inflated self-esteem, and high energy) on social,  occupational, and interpersonal functioning; supplement with semi-structured inventory, if desired (e.g., Young Mania Rating Scale; the Clinical Monitoring Form); re-administer as indicated to assess treatment response. Use cognitive therapy techniques to explore and educate the client's about cognitive biases that trigger his/her elevated or depressive mood (see Cognitive Therapy for Bipolar Disorder by Lizette dunker al.). Teach the client cognitive behavioral coping and relapse prevention skills including delaying impulsive actions, structured scheduling of daily activities, keeping a regular sleep routine, avoiding unrealistic goal striving, using relaxation procedures, identifying and avoiding episode triggers such as stimulant drug use, alcohol consumption, breaking sleep routine, or exposing self to high stress (see Cognitive Therapy  for Bipolar Disorder by Lizette dunker al.). Pledge to be there consistently to help, listen to, and support the client. Explore the client's fears of abandonment by sources of love and nurturance. Help the client differentiate between real and imagined, actual and exaggerated losses. Probe real or perceived losses in the client's life. Review ways for the client to replace the losses and put them in perspective. Probe the causes for the client's low self-esteem and abandonment fears in the family-of-origin history. Develop the client's family genogram and/or symptom line and help identify patterns of dysfunction within the family. Assign the client to write a forgiveness letter to the perpetrator of abuse (or assign Feelings and Forgiveness Letter in the Adult Psychotherapy Homework Planner by Jenniffer); process the letter. Teach the client the benefits (i.e., release of hurt and anger, putting issue in the past, opens door for trust of others, etc.) of beginning a process of forgiveness of (not necessarily forgetting or fraternizing with) abusive adults. Assist the client in clarifying his/her role within the family and his/her feelings connected to that role. Explore the client's painful childhood experiences (or assign Share the Painful Memory in the Adult Psychotherapy Homework Planner by Jenniffer). Support and encourage the client when he/she begins to express feelings of rage, sadness, fear, and rejection relating to family abuse or neglect. Assign the client to record feelings in a journal that describes memories, behavior, and emotions tied to his/her traumatic childhood experiences (or assign How the Trauma Affects Me in the Adult Psychotherapy Homework Planner by Jenniffer). Ask the client to compare his/her parenting behavior to that of parent figures of his/her childhood; encourage the client to be aware of how easily we repeat patterns that we grew up with.   Diagnosis:Bipolar 1 disorder,  mixed, severe (HCC)  MDD (major depressive disorder), recurrent episode, mild  GAD (generalized anxiety disorder)  PTSD (post-traumatic stress disorder)  R/O Bipolar Disorder  Plan:  -meet again on Monday, November 05, 2024 at 10am

## 2024-10-04 NOTE — Telephone Encounter (Signed)
 Please let patient know.

## 2024-10-05 ENCOUNTER — Other Ambulatory Visit (HOSPITAL_BASED_OUTPATIENT_CLINIC_OR_DEPARTMENT_OTHER): Payer: Self-pay

## 2024-10-05 NOTE — Telephone Encounter (Signed)
 Patient is aware and has responded to the provider in a separate message.

## 2024-10-05 NOTE — Telephone Encounter (Signed)
 Patient requesting a PA for Wegovy  be placed for something other than weight loss. I was not aware that there was another indication for Wegovy  approval ?

## 2024-10-07 ENCOUNTER — Other Ambulatory Visit (HOSPITAL_BASED_OUTPATIENT_CLINIC_OR_DEPARTMENT_OTHER): Payer: Self-pay

## 2024-10-08 ENCOUNTER — Other Ambulatory Visit (HOSPITAL_BASED_OUTPATIENT_CLINIC_OR_DEPARTMENT_OTHER): Payer: Self-pay

## 2024-10-11 ENCOUNTER — Other Ambulatory Visit (HOSPITAL_BASED_OUTPATIENT_CLINIC_OR_DEPARTMENT_OTHER): Payer: Self-pay

## 2024-10-12 ENCOUNTER — Other Ambulatory Visit (HOSPITAL_BASED_OUTPATIENT_CLINIC_OR_DEPARTMENT_OTHER): Payer: Self-pay

## 2024-10-13 ENCOUNTER — Other Ambulatory Visit (HOSPITAL_BASED_OUTPATIENT_CLINIC_OR_DEPARTMENT_OTHER): Payer: Self-pay

## 2024-10-14 ENCOUNTER — Other Ambulatory Visit (HOSPITAL_BASED_OUTPATIENT_CLINIC_OR_DEPARTMENT_OTHER): Payer: Self-pay

## 2024-10-15 ENCOUNTER — Telehealth: Payer: Self-pay

## 2024-10-15 ENCOUNTER — Other Ambulatory Visit (HOSPITAL_BASED_OUTPATIENT_CLINIC_OR_DEPARTMENT_OTHER): Payer: Self-pay

## 2024-10-15 NOTE — Telephone Encounter (Signed)
**Note De-Identified Jamarrion Budai Obfuscation** I started a Split Night Sleep Study PA through the [facility] Provider Portal and it is currently pending: Status: PENDED Tracking #: [id number]

## 2024-10-18 ENCOUNTER — Ambulatory Visit: Admitting: Physician Assistant

## 2024-10-18 ENCOUNTER — Other Ambulatory Visit (HOSPITAL_BASED_OUTPATIENT_CLINIC_OR_DEPARTMENT_OTHER): Payer: Self-pay

## 2024-10-18 ENCOUNTER — Other Ambulatory Visit: Payer: Self-pay

## 2024-10-18 ENCOUNTER — Encounter: Payer: Self-pay | Admitting: Physician Assistant

## 2024-10-18 VITALS — BP 114/76 | HR 105 | Ht 62.0 in | Wt 165.0 lb

## 2024-10-18 DIAGNOSIS — I491 Atrial premature depolarization: Secondary | ICD-10-CM

## 2024-10-18 DIAGNOSIS — E66811 Obesity, class 1: Secondary | ICD-10-CM | POA: Diagnosis not present

## 2024-10-18 DIAGNOSIS — I10 Essential (primary) hypertension: Secondary | ICD-10-CM | POA: Diagnosis not present

## 2024-10-18 DIAGNOSIS — N3001 Acute cystitis with hematuria: Secondary | ICD-10-CM | POA: Diagnosis not present

## 2024-10-18 DIAGNOSIS — R300 Dysuria: Secondary | ICD-10-CM

## 2024-10-18 DIAGNOSIS — E6609 Other obesity due to excess calories: Secondary | ICD-10-CM

## 2024-10-18 DIAGNOSIS — T3695XA Adverse effect of unspecified systemic antibiotic, initial encounter: Secondary | ICD-10-CM

## 2024-10-18 DIAGNOSIS — B379 Candidiasis, unspecified: Secondary | ICD-10-CM | POA: Insufficient documentation

## 2024-10-18 DIAGNOSIS — I479 Paroxysmal tachycardia, unspecified: Secondary | ICD-10-CM

## 2024-10-18 DIAGNOSIS — Z6830 Body mass index (BMI) 30.0-30.9, adult: Secondary | ICD-10-CM | POA: Diagnosis not present

## 2024-10-18 LAB — POCT URINALYSIS DIP (CLINITEK)
Bilirubin, UA: NEGATIVE
Glucose, UA: NEGATIVE mg/dL
Nitrite, UA: NEGATIVE
POC PROTEIN,UA: >=300 — AB
Spec Grav, UA: 1.025 (ref 1.010–1.025)
Urobilinogen, UA: 1 U/dL
pH, UA: 7.5 (ref 5.0–8.0)

## 2024-10-18 MED ORDER — NEBIVOLOL HCL 5 MG PO TABS
5.0000 mg | ORAL_TABLET | Freq: Every day | ORAL | 0 refills | Status: DC
  Filled 2024-10-18 – 2024-11-30 (×2): qty 90, 90d supply, fill #0

## 2024-10-18 MED ORDER — FLUCONAZOLE 150 MG PO TABS
ORAL_TABLET | ORAL | 0 refills | Status: DC
  Filled 2024-10-18: qty 2, 2d supply, fill #0

## 2024-10-18 MED ORDER — NITROFURANTOIN MONOHYD MACRO 100 MG PO CAPS
100.0000 mg | ORAL_CAPSULE | Freq: Two times a day (BID) | ORAL | 0 refills | Status: DC
  Filled 2024-10-18: qty 10, 5d supply, fill #0

## 2024-10-18 NOTE — Progress Notes (Signed)
 Established Patient Office Visit  Subjective   Patient ID: Kelsey Vaughan, female    DOB: Dec 08, 1976  Age: 48 y.o. MRN: [id number]  Chief Complaint  Patient presents with   Medical Management of Chronic Issues    HPI .SABRADiscussed the use of AI scribe software for clinical note transcription with the patient, who gave verbal consent to proceed.  History of Present Illness A 48 year old female who presents with symptoms of a urinary tract infection and concerns about weight management.  Lower urinary tract symptoms - Dysuria and irritation present - Significant discomfort and burning sensation during urination - No fever - No flank pain - Abdominal tenderness noted - Self-treatment with amoxicillin /clavulanic acid resulted in increased irritation  Weight management concerns - Recent weight loss from 186 pounds to 165 pounds, with previous low of 164 pounds - Discontinued Wegovy  approximately three to four weeks ago - Increased cravings and difficulty managing weight since stopping Wegovy  - Concern about regaining weight - Insurance does not currently cover Wegovy , limiting ability to continue treatment - Recognizes importance of nutrition in weight management  Blood pressure and cardiac symptoms - Inconsistent use of Bystolic , taken as needed for heart palpitations  Physical activity limitations - Works four days a week for ten hours per day - Work schedule impacts ability to exercise regularly   ROS See HPI.    Objective:     BP 114/76   Pulse (!) 105   Ht 5' 2 (1.575 m)   Wt 165 lb (74.8 kg)   SpO2 99%   BMI 30.18 kg/m  BP Readings from Last 3 Encounters:  10/18/24 114/76  09/17/24 123/85  08/30/24 126/73   Wt Readings from Last 3 Encounters:  10/18/24 165 lb (74.8 kg)  09/17/24 166 lb (75.3 kg)  08/30/24 167 lb 6.4 oz (75.9 kg)      Physical Exam Constitutional:      Appearance: Normal appearance. She is obese.  HENT:     Head: Normocephalic.   Cardiovascular:     Rate and Rhythm: Normal rate and regular rhythm.  Pulmonary:     Effort: Pulmonary effort is normal.     Breath sounds: Normal breath sounds.  Abdominal:     General: There is no distension.     Palpations: Abdomen is soft.     Tenderness: There is abdominal tenderness. There is no right CVA tenderness, left CVA tenderness, guarding or rebound.     Comments: Suprapubic tenderness.   Musculoskeletal:     Right lower leg: No edema.     Left lower leg: No edema.  Neurological:     General: No focal deficit present.     Mental Status: She is alert and oriented to person, place, and time.  Psychiatric:        Mood and Affect: Mood normal.      Results for orders placed or performed in visit on 10/18/24  POCT URINALYSIS DIP (CLINITEK)  Result Value Ref Range   Color, UA yellow yellow   Clarity, UA clear clear   Glucose, UA negative negative mg/dL   Bilirubin, UA negative negative   Ketones, POC UA trace (5) (A) negative mg/dL   Spec Grav, UA 8.974 8.989 - 1.025   Blood, UA moderate (A) negative   pH, UA 7.5 5.0 - 8.0   POC PROTEIN,UA >=300 (A) negative, trace   Urobilinogen, UA 1.0 0.2 or 1.0 E.U./dL   Nitrite, UA Negative Negative   Leukocytes, UA Small (1+) (  A) Negative     The 10-year ASCVD risk score (Arnett DK, et al., 2019) is: 4%    Assessment & Plan:  SABRASABRAMykira was seen today for medical management of chronic issues.  Diagnoses and all orders for this visit:  Acute cystitis with hematuria -     nitrofurantoin , macrocrystal-monohydrate, (MACROBID ) 100 MG capsule; Take 1 capsule (100 mg total) by mouth 2 (two) times daily.  Class 1 obesity due to excess calories without serious comorbidity with body mass index (BMI) of 30.0 to 30.9 in adult  Dysuria -     POCT URINALYSIS DIP (CLINITEK) -     Urine Culture  Primary hypertension -     nebivolol  (BYSTOLIC ) 5 MG tablet; Take 1 tablet (5 mg total) by mouth at bedtime.  PAC (premature atrial  contraction) -     nebivolol  (BYSTOLIC ) 5 MG tablet; Take 1 tablet (5 mg total) by mouth at bedtime.  Tachycardia, paroxysmal (HCC) -     nebivolol  (BYSTOLIC ) 5 MG tablet; Take 1 tablet (5 mg total) by mouth at bedtime.  Antibiotic-induced yeast infection -     fluconazole  (DIFLUCAN ) 150 MG tablet; Take one tablet now and then take one tablet when finished with macrobid .   Assessment & Plan Urinary tract infection Confirmed UTI with dysuria and urinalysis showing blood and lymphocytes. Likely secondary yeast infection from antibiotics. - Prescribed macrobid  for 5 days.  - Prescribe yeast infection medication for immediate use and post-antibiotic course.  Obesity, class 1 Weight loss achieved but cravings returned post-Wegovy . Insurance issues with Wegovy ; compounded versions considered. Discussed obesity management challenges without medication. - Send Wegovy  prescription once insurance is active. - Consider [facility] compounded Wegovy  if affordable. - Discussed alternative medications like Contrave, but cost is prohibitive. - Encourage lifestyle modifications: chewing gum, walking, increased water intake.  Primary hypertension Elevated blood pressure; inconsistent Bystolic  use. Emphasized daily medication for blood pressure control and prevention of palpitations. - Encourage daily Bystolic  use, 5mg  daily - Recheck blood pressure today. - Keep blood pressure log and report in two weeks.  Paroxysmal palpitations Managed with Bystolic ; stressed consistent use to prevent episodes. - Encourage daily Bystolic  use to manage palpitations.     Return in about 2 weeks (around 11/01/2024), or if symptoms worsen or fail to improve, for bP recheck.    Tony Friscia, PA-C

## 2024-10-18 NOTE — Patient Instructions (Addendum)
 Restart bystolic  daily.  BP log and return to clinic in 2 weeks   Urinary Tract Infection, Female A urinary tract infection (UTI) is an infection in your urinary tract. The urinary tract is made up of organs that make, store, and get rid of pee (urine) in your body. These organs include: The kidneys. The ureters. The bladder. The urethra. What are the causes? Most UTIs are caused by germs called bacteria. They may be in or near your genitals. These germs grow and cause swelling in your urinary tract. What increases the risk? You're more likely to get a UTI if: You're a female. The urethra is shorter in females than in males. You have a soft tube called a catheter that drains your pee. You can't control when you pee or poop. You have trouble peeing because of: A kidney stone. A urinary blockage. A nerve condition that affects your bladder. Not getting enough to drink. You're sexually active. You use a birth control inside your vagina, like spermicide. You're pregnant. You have low levels of the hormone estrogen in your body. You're an older adult. You're also more likely to get a UTI if you have other health problems. These may include: Diabetes. A weak immune system. Your immune system is your body's defense system. Sickle cell disease. Injury of the spine. What are the signs or symptoms? Symptoms may include: Needing to pee right away. Peeing small amounts often. Pain or burning when you pee. Blood in your pee. Pee that smells bad or odd. Pain in your belly or lower back. You may also: Feel confused. This may be the first symptom in older adults. Vomit. Not feel hungry. Feel tired or easily annoyed. Have a fever or chills. How is this diagnosed? A UTI is diagnosed based on your medical history and an exam. You may also have other tests. These may include: Pee tests. Blood tests. Tests for sexually transmitted infections (STIs). If you've had more than one UTI, you  may need to have imaging studies done to find out why you keep getting them. How is this treated? A UTI can be treated by: Taking antibiotics or other medicines. Drinking enough fluid to keep your pee pale yellow. In rare cases, a UTI can cause a very bad condition called sepsis. Sepsis may be treated in the hospital. Follow these instructions at home: Medicines Take your medicines only as told by your health care provider. If you were given antibiotics, take them as told by your provider. Do not stop taking them even if you start to feel better. General instructions Make sure you: Pee often and fully. Do not hold your pee for a long time. Wipe from front to back after you pee or poop. Use each tissue only once when you wipe. Pee after you have sex. Do not douche or use sprays or powders in your genital area. Contact a health care provider if: Your symptoms don't get better after 1-2 days of taking antibiotics. Your symptoms go away and then come back. You have a fever or chills. You vomit or feel like you may vomit. Get help right away if: You have very bad pain in your back or lower belly. You faint. This information is not intended to replace advice given to you by your health care provider. Make sure you discuss any questions you have with your health care provider. Document Revised: 11/12/2023 Document Reviewed: 03/07/2023 Elsevier Patient Education  2025 [employer].

## 2024-10-19 ENCOUNTER — Other Ambulatory Visit (HOSPITAL_BASED_OUTPATIENT_CLINIC_OR_DEPARTMENT_OTHER): Payer: Self-pay

## 2024-10-20 ENCOUNTER — Ambulatory Visit: Payer: Self-pay | Admitting: Physician Assistant

## 2024-10-20 LAB — URINE CULTURE

## 2024-10-20 NOTE — Progress Notes (Signed)
No significant bacteria found in culture.

## 2024-10-25 NOTE — Telephone Encounter (Signed)
 Spoke to patient advised insurance denied coverage for sleep study.Advised Dr.Kelly has retired.Appointment scheduled with Lum Louis NP 11/24 at 10:55 am.Directions given.

## 2024-10-25 NOTE — Telephone Encounter (Signed)
 [employer] called to inform of the denial for the Sleep Study PA [phone]) and it was denied due lack of medical necessity. Representative left contact information to start a peer to peer [phone]. Please advise

## 2024-10-28 ENCOUNTER — Other Ambulatory Visit (HOSPITAL_BASED_OUTPATIENT_CLINIC_OR_DEPARTMENT_OTHER): Payer: Self-pay

## 2024-11-01 ENCOUNTER — Other Ambulatory Visit: Payer: Self-pay | Admitting: Nephrology

## 2024-11-01 ENCOUNTER — Ambulatory Visit: Attending: Emergency Medicine | Admitting: Emergency Medicine

## 2024-11-01 ENCOUNTER — Ambulatory Visit

## 2024-11-01 ENCOUNTER — Encounter: Payer: Self-pay | Admitting: Emergency Medicine

## 2024-11-01 VITALS — BP 110/68 | HR 94 | Ht 62.0 in | Wt 168.0 lb

## 2024-11-01 DIAGNOSIS — I479 Paroxysmal tachycardia, unspecified: Secondary | ICD-10-CM | POA: Diagnosis present

## 2024-11-01 DIAGNOSIS — G4730 Sleep apnea, unspecified: Secondary | ICD-10-CM

## 2024-11-01 DIAGNOSIS — I480 Paroxysmal atrial fibrillation: Secondary | ICD-10-CM | POA: Diagnosis not present

## 2024-11-01 DIAGNOSIS — I493 Ventricular premature depolarization: Secondary | ICD-10-CM

## 2024-11-01 DIAGNOSIS — I10 Essential (primary) hypertension: Secondary | ICD-10-CM

## 2024-11-01 DIAGNOSIS — N039 Chronic nephritic syndrome with unspecified morphologic changes: Secondary | ICD-10-CM

## 2024-11-01 DIAGNOSIS — R002 Palpitations: Secondary | ICD-10-CM | POA: Diagnosis not present

## 2024-11-01 NOTE — Patient Instructions (Signed)
 Medication Instructions:  NO CHANGES  Lab Work: NONE TO BE DONE TODAY.  Testing/Procedures: Kelsey Vaughan- Long Term Monitor Instructions  Your physician has requested you wear a ZIO patch monitor for 7 days.  This is a single patch monitor. [employer] supplies one patch monitor per enrollment. Additional stickers are not available. Please do not apply patch if you will be having a Nuclear Stress Test,  Echocardiogram, Cardiac CT, MRI, or Chest Xray during the period you would be wearing the  monitor. The patch cannot be worn during these tests. You cannot remove and re-apply the  ZIO XT patch monitor.  Your ZIO patch monitor will be mailed 3 day USPS to your address on file. It may take 3-5 days  to receive your monitor after you have been enrolled.  Once you have received your monitor, please review the enclosed instructions. Your monitor  has already been registered assigning a specific monitor serial # to you.  Billing and Patient Assistance Program Information  We have supplied [employer] with any of your insurance information on file for billing purposes. [employer] offers a sliding scale Patient Assistance Program for patients that do not have  insurance, or whose insurance does not completely cover the cost of the ZIO monitor.  You must apply for the Patient Assistance Program to qualify for this discounted rate.  To apply, please call [employer] at [phone], select option 4, select option 2, ask to apply for  Patient Assistance Program. Kelsey Vaughan will ask your household income, and how many people  are in your household. They will quote your out-of-pocket cost based on that information.  [employer] will also be able to set up a 62-month, interest-free payment plan if needed.  Applying the monitor   Shave hair from upper left chest.  Hold abrader disc by orange tab. Rub abrader in 40 strokes over the upper left chest as  indicated in your monitor instructions.  Clean area with 4 enclosed  alcohol pads. Let dry.  Apply patch as indicated in monitor instructions. Patch will be placed under collarbone on left  side of chest with arrow pointing upward.  Rub patch adhesive wings for 2 minutes. Remove white label marked 1. Remove the white  label marked 2. Rub patch adhesive wings for 2 additional minutes.  While looking in a mirror, press and release button in center of patch. A small green light will  flash 3-4 times. This will be your only indicator that the monitor has been turned on.  Do not shower for the first 24 hours. You may shower after the first 24 hours.  Press the button if you feel a symptom. You will hear a small click. Record Date, Time and  Symptom in the Patient Logbook.  When you are ready to remove the patch, follow instructions on the last 2 pages of Patient  Logbook. Stick patch monitor onto the last page of Patient Logbook.  Place Patient Logbook in the blue and white box. Use locking tab on box and tape box closed  securely. The blue and white box has prepaid postage on it. Please place it in the mailbox as  soon as possible. Your physician should have your test results approximately 7 days after the  monitor has been mailed back to [facility].  Call [facility] Customer Care at [phone] if you have questions regarding  your ZIO XT patch monitor. Call them immediately if you see an orange light blinking on your  monitor.  If your monitor falls  off in less than 4 days, contact our Monitor department at [phone].  If your monitor becomes loose or falls off after 4 days call [employer] at [phone] for  suggestions on securing your monitor   WatchPAT? is an FDA-cleared portable home sleep study test that uses a watch and 3 points of contact to monitor 7 different channels, including your heart rate, oxygen saturation, body position, snoring, and chest motion.  The study is easy to use from the comfort of your own home and accurately  detect sleep apnea.  Before bed, you attach the chest sensor, attached the sleep apnea bracelet to your nondominant hand, and attach the finger probe.  After the study, the raw data is downloaded from the watch and scored for apnea events.   For more information: [email or web address]  Patient Testing Instructions:  Once our office has received insurance approval for you to complete this test, we will contact you with a PIN to activate the device.  This typically takes 2-3 weeks.  Please do not open the box until approved.   Do not put battery into the device until bedtime when you are ready to begin the test. Please call the support number if you need assistance after following the instructions below: 24 hour support line- [phone] or [facility] support at [phone] (option 2)  Download the [facility] WatchPAT One app through the [employer] or [employer]. Be sure to turn on or enable access to Bluetooth in settings on your smartphone. Make sure no other Bluetooth devices are on and within the vicinity of your smartphone and WatchPAT watch during testing.  Make sure to leave your smart phone plugged in and charging all night.  When ready for bed:  Follow the instructions step by step in the WatchPAT One app to activate the testing device. For additional instructions, including video instruction, visit the WatchPAT One video on Youtube. You can search for WatchPAT One within Youtube (video is 4 minutes and 18 seconds) or enter: https://youtube/[email or web address]?[email or web address]_[email or web address] Please note: You will be prompted to enter a PIN to connect via Bluetooth when starting the test. The PIN will be assigned to you after insurance has approved the test.  The device is disposable, but it recommended that you retain the device until you receive a call letting you know the study has been received and the results have been interpreted.  We will let you know if the study did not transmit to us   properly after the test is completed. You do not need to call us  to confirm the receipt of the test.  Please complete the test within 48 hours of receiving PIN.   Frequently Asked Questions:  What is Watch PAT One?  A single use, fully disposable home sleep apnea testing device and will not need to be returned after completion.  What are the requirements to use WatchPAT One?  A successful WatchPAT One sleep study requires a WatchPAT One device, your smart phone, WatchPAT One app, your PIN number, and internet access. What type of phone do I need?  You should have a smart phone that uses Android 5.1 and above or any iPhone with IOS 10 and above. How can I download the WatchPAT one app?  Based on your device type search for WatchPAT One app either in [employer] for [employer] or [employer] for [employer]. Where will I get my PIN for the study?  Your PIN will be provided by your physician's office after insurance has  approved the test. This process typically takes 2-3 weeks. It is used for authentication and if you lose/forget your PIN, please reach out to your provider's office.  I do not have internet at home. Can I still complete a WatchPAT One study?  WatchPAT One needs internet connection throughout the night to be able to transmit the sleep data. You can use your home/local internet or your cellular data package. However, it is always recommended to use home/local internet. It is estimated that between 20MB-30MB of data will be used with each study, but the application will be looking for space in the phone to start the study.  What happens if I lose internet or Bluetooth connection?  During the internet disconnection, your phone will not be able to transmit the sleep data.  All the data, will be stored in your phone.  As soon as the internet connection is back on, the phone will resume sending the sleep data. During the Bluetooth disconnection, WatchPAT One will not be able  to to send the sleep data to your phone.  Data will be kept in the WatchPAT One until both devices have Bluetooth connection back on.  As soon as the connection is back on, WatchPAT one will send the sleep data to the phone.  How long do I need to wear the WatchPAT one?  After you start the study, you should wear the device at least 6 hours.  How far should I keep my phone from the device?  During the night, your phone should remain within 15 feet of where you sleep.  What happens if I leave the room for restroom or other reasons?  Leaving the room for any reason will not cause any problem. As soon as your get back to the room, both devices will reconnect and will continue to send the sleep data. Can I use my phone during the sleep study?  Yes, you can use your phone as usual during the study. But it is recommended to put your WatchPAT One on when you are ready to go to bed.  How will I get my study results?  A soon as you completed your study, your sleep data will be sent to the provider. They will then share the results with you when they are ready.    Follow-Up: At [facility], you and your health needs are our priority.  As part of our continuing mission to provide you with exceptional heart care, our providers are all part of one team.  This team includes your primary Cardiologist (physician) and Advanced Practice Providers or APPs (Physician Assistants and Nurse Practitioners) who all work together to provide you with the care you need, when you need it.  Your next appointment:   3 MONTHS  Provider:   Georganna Archer, MD

## 2024-11-01 NOTE — Progress Notes (Unsigned)
 [employer]/ [employer] long term monitor with Hydrolloid sensitive skin strips shipped to patients address on file.    Dr. Georganna Archer to read.

## 2024-11-01 NOTE — Progress Notes (Signed)
 Cardiology Office Note:    Date:  11/01/2024  ID:  Kelsey Vaughan, DOB [birth year], MRN [id number] PCP: Antoniette Vermell LITTIE DEVONNA  [name] Cardiologist:  Georganna Archer, MD Cardiology APP:  Rana Lum LITTIE, NP       Patient Profile:       Chief Complaint: 1 year follow-up History of Present Illness:  Kelsey Vaughan is a 48 y.o. female with visit-pertinent history of palpitations, paroxysmal atrial fibrillation  Patient noted to have an episode of atrial fibrillation in 02/2016. Event monitor in 01/2017 showed sinus rhythm with PVCs. She had an echocardiogram in April 2017 that showed normal LV function. Bystolic  was increased in 05/2017 because of palpitations associated with PVCs. She underwent sleep study in November 2018 which did not reveal any significant sleep apnea with an AHI of 1.2/hr  Seen by Dr. Pietro for a telehealth visit on 12/28/2019 at which time patient reported worsening palpitations but admitted she had not been taking her Bystolic . She also reported occasional sharp pain in the right side of her chest that last for 1-2 seconds as well as some dyspnea on exertion. She denied any exertional chest pain; however, she was concerned about CAD given her family history. Therefore, calcium  score was ordered for risk stratification and Bystolic  was restarted to help with palpitations. Coronary calcium  score came back as 0.   She was last seen in clinic on 10/28/2023 by Dr. Burnard.  She reported to be experiencing slightly more palpitations that particularly wake her up from sleep.  Her Bystolic  was increased from 5 to 7.5 mg twice a day.  Repeat sleep study was ordered but denied by insurance.   Discussed the use of AI scribe software for clinical note transcription with the patient, who gave verbal consent to proceed.  History of Present Illness Kelsey Vaughan is a 48 year old female with atrial fibrillation who presents with palpitations in 1 year  follow-up.  Palpitations occur primarily at night when lying down and are described as a racing or fluttering heart sensation. These episodes are sometimes accompanied by dyspnea, prompting her to sit up. She experiences dizziness and lightheadedness, particularly when walking. She currently takes Bystolic  5 mg at bedtime and a baby aspirin .  There is a significant family history of cardiac issues, including her father who died of a massive heart attack at 71 and similar issues in his siblings.  She uses an Apple Watch to monitor her heart rate. A rash from a heart monitor adhesive prevented completion of recent ZIO monitoring.  She denies chest pains, PND, orthopnea, hematochezia, melena, syncope, presyncope   Review of systems:  Please see the history of present illness. All other systems are reviewed and otherwise negative.      Studies Reviewed:    EKG Interpretation Date/Time:  Monday November 01 2024 08:16:50 EST Ventricular Rate:  94 PR Interval:  150 QRS Duration:  72 QT Interval:  362 QTC Calculation: 452 R Axis:   5  Text Interpretation: Normal sinus rhythm Cannot rule out Anterior infarct , age undetermined When compared with ECG of 16-Jul-2024 10:46, PREVIOUS ECG IS PRESENT Confirmed by Rana Lum [phone]) on 11/01/2024 8:24:03 AM    Echocardiogram 09/24/2023  1. Left ventricular ejection fraction, by estimation, is 60 to 65%. The  left ventricle has normal function. The left ventricle has no regional  wall motion abnormalities. Left ventricular diastolic parameters are  consistent with Grade I diastolic  dysfunction (impaired relaxation).   2. Right ventricular  systolic function is normal. The right ventricular  size is normal.   3. The mitral valve is normal in structure. No evidence of mitral valve  regurgitation. No evidence of mitral stenosis.   4. The aortic valve is tricuspid. Aortic valve regurgitation is not  visualized. No aortic stenosis is present.    5. The inferior vena cava is normal in size with greater than 50%  respiratory variability, suggesting right atrial pressure of 3 mmHg.   Zio 02/18/2020 Sinus rhythm with PACs, PVCs and 7 beats nonsustained ventricular tachycardia.  Echocardiogram 02/28/2020 1. Left ventricular ejection fraction, by estimation, is 55 to 60%. The  left ventricle has normal function. The left ventricle has no regional  wall motion abnormalities. There is mild left ventricular hypertrophy.  Left ventricular diastolic parameters  are consistent with Grade I diastolic dysfunction (impaired relaxation).   2. Right ventricular systolic function is normal. The right ventricular  size is normal.   3. The mitral valve is normal in structure. Trivial mitral valve  regurgitation.   4. The aortic valve is normal in structure. Aortic valve regurgitation is  not visualized.   5. The inferior vena cava is normal in size with greater than 50%  respiratory variability, suggesting right atrial pressure of 3 mmHg.   CT cardiac scoring 01/04/2020 IMPRESSION: Coronary calcium  score of 0. This was 0 percentile for age and sex matched control. Risk Assessment/Calculations:    CHA2DS2-VASc Score = 2   This indicates a 2.2% annual risk of stroke. The patient's score is based upon: CHF History: 0 HTN History: 1 Diabetes History: 0 Stroke History: 0 Vascular Disease History: 0 Age Score: 0 Gender Score: 1         STOP-Bang Score:  4       Physical Exam:   VS:  BP 110/68 (BP Location: Left Arm, Patient Position: Sitting, Cuff Size: Normal)   Pulse 94   Ht 5' 2 (1.575 m)   Wt 168 lb (76.2 kg)   BMI 30.73 kg/m    Wt Readings from Last 3 Encounters:  11/01/24 168 lb (76.2 kg)  10/18/24 165 lb (74.8 kg)  09/17/24 166 lb (75.3 kg)    GEN: Well nourished, well developed in no acute distress NECK: No JVD; No carotid bruits CARDIAC: RRR, no murmurs, rubs, gallops RESPIRATORY:  Clear to auscultation without rales,  wheezing or rhonchi  ABDOMEN: Soft, non-tender, non-distended EXTREMITIES:  No edema; No acute deformity      Assessment and Plan:  Paroxysmal atrial fibrillation Palpitations PVCs She had an episode of atrial fibrillation in 2017.  Event monitor in 2018 showed sinus rhythm with PVCs Event monitor 02/2020 showed sinus rhythm with PVCs/PACs She underwent sleep study in 2018 that did not reveal any significant sleep apnea Previously unable to tolerate metoprolol  due to fatigue - Today she reports nocturnal induced palpitations primarily occurring only at nighttime described as racing or fluttering sensation.  Her palpitations are accompanied by dyspnea prompting her to sit up.  She described similar symptoms 1 year ago - She had a heart monitor that was pending several months ago but was unable to be completed due to skin irritation from the adhesive - Her insurance denied repeat sleep study.  However she does have new insurance this coming month.  Will plan for repeat sleep study due to STOP-BANG score of 4 due to nocturnal dyspnea - Plan for 7-day ZIO monitor with hypoallergenic patches to further evaluate her palpitations - CHA2DS2-VASc = 2 (female,  hypertension). Continue Aspirin  81mg  daily - Continue Bystolic  5 mg daily at nighttime  Hypertension Blood pressure today is 110/60 and well-controlled - Continue Bystolic  5 mg daily      Dispo:  Return in about 3 months (around 02/01/2025).  She is a former Dr. Burnard patient who has retired.  I will have her establish care with Dr. Floretta.  Signed, Lum LITTIE Louis, NP

## 2024-11-01 NOTE — Progress Notes (Deleted)
   Subjective:    Patient ID: Kelsey Vaughan, female    DOB: [birth year], 48 y.o.   MRN: [id number]  HPI  Patient is here for a two week BP check. Patient currently takes nebivolol  (BYSTOLIC ) 5 MG tablet; Take 1 tablet (5 mg total) denies headache, SOB, CP, or vision changes.  Review of Systems     Objective:   Physical Exam        Assessment & Plan:   Patients first BP reading is. Patient sat 10 mins and then it wad retaken. Second reading was reported to [employer], PA-C who would like

## 2024-11-08 ENCOUNTER — Ambulatory Visit: Admitting: Emergency Medicine

## 2024-11-08 ENCOUNTER — Ambulatory Visit: Admitting: Professional

## 2024-11-08 ENCOUNTER — Ambulatory Visit
Admission: RE | Admit: 2024-11-08 | Discharge: 2024-11-08 | Disposition: A | Discharge status: Home / Self Care | Source: Ambulatory Visit | Attending: Nephrology | Admitting: Nephrology

## 2024-11-08 DIAGNOSIS — N039 Chronic nephritic syndrome with unspecified morphologic changes: Secondary | ICD-10-CM

## 2024-11-15 ENCOUNTER — Ambulatory Visit: Admitting: Physician Assistant

## 2024-11-15 ENCOUNTER — Other Ambulatory Visit (HOSPITAL_BASED_OUTPATIENT_CLINIC_OR_DEPARTMENT_OTHER): Payer: Self-pay

## 2024-11-15 ENCOUNTER — Encounter: Payer: Self-pay | Admitting: Physician Assistant

## 2024-11-15 VITALS — BP 132/84 | HR 76 | Ht 62.0 in | Wt 170.0 lb

## 2024-11-15 DIAGNOSIS — I480 Paroxysmal atrial fibrillation: Secondary | ICD-10-CM | POA: Diagnosis not present

## 2024-11-15 DIAGNOSIS — L245 Irritant contact dermatitis due to other chemical products: Secondary | ICD-10-CM

## 2024-11-15 DIAGNOSIS — G4730 Sleep apnea, unspecified: Secondary | ICD-10-CM

## 2024-11-15 DIAGNOSIS — E66811 Obesity, class 1: Secondary | ICD-10-CM

## 2024-11-15 DIAGNOSIS — E6609 Other obesity due to excess calories: Secondary | ICD-10-CM

## 2024-11-15 DIAGNOSIS — Z7689 Persons encountering health services in other specified circumstances: Secondary | ICD-10-CM

## 2024-11-15 DIAGNOSIS — R801 Persistent proteinuria, unspecified: Secondary | ICD-10-CM

## 2024-11-15 DIAGNOSIS — N182 Chronic kidney disease, stage 2 (mild): Secondary | ICD-10-CM

## 2024-11-15 DIAGNOSIS — Z6831 Body mass index (BMI) 31.0-31.9, adult: Secondary | ICD-10-CM

## 2024-11-15 MED ORDER — WEGOVY 0.5 MG/0.5ML ~~LOC~~ SOAJ
0.5000 mg | SUBCUTANEOUS | 0 refills | Status: DC
  Filled 2024-11-15: qty 2, 28d supply, fill #0
  Filled 2024-11-30: qty 6, 84d supply, fill #0
  Filled 2024-12-01: qty 2, 28d supply, fill #0

## 2024-11-15 MED ORDER — TRIAMCINOLONE ACETONIDE 0.1 % EX CREA
1.0000 | TOPICAL_CREAM | Freq: Two times a day (BID) | CUTANEOUS | 0 refills | Status: AC
  Filled 2024-11-15: qty 15, 30d supply, fill #0
  Filled 2024-11-30: qty 80, 40d supply, fill #0

## 2024-11-15 NOTE — Progress Notes (Unsigned)
 Established Patient Office Visit  Subjective   Patient ID: Kelsey Vaughan, female    DOB: [birth year]  Age: 48 y.o. MRN: [id number]  Chief Complaint  Patient presents with   Medical Management of Chronic Issues    Wgt management    HPI .SABRADiscussed the use of AI scribe software for clinical note transcription with the patient, who gave verbal consent to proceed.  History of Present Illness Kelsey Vaughan is a 48 year old female who presents for medication management and follow-up on recent specialist visits.  Weight gain and appetite changes - Weight increase of 2 pounds since last visit and not having Wegovy  - Significantly increased appetite - Uncertainty regarding insurance coverage for Wegovy  under new [employer] plan  Cardiac evaluation and heart rate management - Recent visit to new cardiologist due to previous provider's retirement - Normal EKG performed during cardiology visit - Holter monitor recommended but discontinued early due to skin irritation from the patch - Continues Bystolic  for heart rate management - Manual blood pressure readings are normal; automated readings appear elevated  Renal evaluation - Recent follow-up with kidney specialist - Blood work performed for further assessment - Discussion regarding possible kidney biopsy; patient is hesitant to proceed - Kidney issues initially identified during referral to [facility] with Dr. Tobie  Sleep disturbance evaluation - Previous sleep study in 2018 at [facility] showed no significant apnea events - Issues with insurance coverage for repeat sleep study due to incorrect billing of Medicaid as primary instead of [employer]    ROS See HPI.    Objective:     BP 132/84   Pulse 76   Ht 5' 2 (1.575 m)   Wt 170 lb (77.1 kg)   SpO2 99%   BMI 31.09 kg/m  BP Readings from Last 3 Encounters:  11/15/24 132/84  11/01/24 110/68  10/18/24 114/76   Wt Readings from Last 3 Encounters:  11/15/24 170  lb (77.1 kg)  11/01/24 168 lb (76.2 kg)  10/18/24 165 lb (74.8 kg)      Physical Exam Constitutional:      Appearance: Normal appearance. She is obese.  HENT:     Head: Normocephalic.  Cardiovascular:     Rate and Rhythm: Normal rate and regular rhythm.  Pulmonary:     Effort: Pulmonary effort is normal.     Breath sounds: Normal breath sounds.  Neurological:     General: No focal deficit present.     Mental Status: She is alert and oriented to person, place, and time.  Psychiatric:        Mood and Affect: Mood normal.      The 10-year ASCVD risk score (Arnett DK, et al., 2019) is: 7.5%    Assessment & Plan:  .Kelsey Vaughan was seen today for medical management of chronic issues.  Diagnoses and all orders for this visit:  Encounter for weight management -     WEGOVY  0.5 MG/0.5ML SOAJ SQ injection; Inject 0.5 mg into the skin once a week. Use this dose for 1 month (4 shots) and then increase to next higher dose.  Irritant contact dermatitis due to other chemical products -     triamcinolone  cream (KENALOG ) 0.1 %; Apply 1 Application topically 2 (two) times daily. Over itchy irritated skin.  Class 1 obesity due to excess calories with serious comorbidity and body mass index (BMI) of 31.0 to 31.9 in adult -     WEGOVY  0.5 MG/0.5ML SOAJ SQ injection; Inject 0.5 mg into the  skin once a week. Use this dose for 1 month (4 shots) and then increase to next higher dose.  PAF (paroxysmal atrial fibrillation) (HCC)  Sleep-disordered breathing  Persistent proteinuria  CKD (chronic kidney disease) stage 2, GFR 60-89 ml/min   Assessment & Plan Obesity Management with Wegovy  0.5 mg. No side effects reported. She was losing weight and feeling really good.  Insurance coverage issues with [employer]. She feels like pharmacy is not running through right insurance.  Discussed alternative options like Zepbound  and compounded medications, but cost is prohibitive. She is hesitant about online  sources due to safety concerns. - Sent Wegovy  prescription to pharmacy. - Attempting to get Wegovy  covered under [employer]. - Will taper up as tolerated.    Proteinuria. Nephrologist recommended kidney biopsy to assess for potential kidney damage. She is hesitant about biopsy but understands the importance of ruling out major issues. - Continue follow-up with nephrologist regarding kidney biopsy.  PAF Managed with Bystolic . Blood pressure is well-controlled manually, but elevated on machine. Cardiologist confirmed normal heart function and recommended new cardiologist due to retirement of current cardiologist. - Continue Bystolic  for heart rate management. - Follow up with new cardiologist.  Pruritus and rash due to Holter monitor adhesive Pruritus from Holter monitor adhesive causing skin irritation. She removed the patch early due to discomfort. - Prescribed triamcinolone  cream for pruritus.  Sleep disordered breathing Previous sleep study in 2018 showed non-significant AHI events. Insurance issues with sleep study coverage. She prefers not to pay out-of-pocket for home sleep study. - Will wait until new year for potential deductible coverage for sleep study.     Return in about 3 months (around 02/13/2025).    Jordy Hewins, PA-C

## 2024-11-16 ENCOUNTER — Encounter: Payer: Self-pay | Admitting: Physician Assistant

## 2024-11-16 DIAGNOSIS — N182 Chronic kidney disease, stage 2 (mild): Secondary | ICD-10-CM | POA: Insufficient documentation

## 2024-11-16 DIAGNOSIS — I480 Paroxysmal atrial fibrillation: Secondary | ICD-10-CM | POA: Insufficient documentation

## 2024-11-16 DIAGNOSIS — G4730 Sleep apnea, unspecified: Secondary | ICD-10-CM | POA: Insufficient documentation

## 2024-11-16 DIAGNOSIS — L245 Irritant contact dermatitis due to other chemical products: Secondary | ICD-10-CM | POA: Insufficient documentation

## 2024-11-18 DIAGNOSIS — I480 Paroxysmal atrial fibrillation: Secondary | ICD-10-CM | POA: Diagnosis not present

## 2024-11-18 DIAGNOSIS — R809 Proteinuria, unspecified: Secondary | ICD-10-CM | POA: Diagnosis not present

## 2024-11-18 DIAGNOSIS — G4739 Other sleep apnea: Secondary | ICD-10-CM | POA: Diagnosis not present

## 2024-11-18 DIAGNOSIS — N182 Chronic kidney disease, stage 2 (mild): Secondary | ICD-10-CM | POA: Diagnosis not present

## 2024-11-18 DIAGNOSIS — Z6831 Body mass index (BMI) 31.0-31.9, adult: Secondary | ICD-10-CM | POA: Diagnosis not present

## 2024-11-25 ENCOUNTER — Other Ambulatory Visit (HOSPITAL_BASED_OUTPATIENT_CLINIC_OR_DEPARTMENT_OTHER): Payer: Self-pay

## 2024-11-30 ENCOUNTER — Other Ambulatory Visit (HOSPITAL_BASED_OUTPATIENT_CLINIC_OR_DEPARTMENT_OTHER): Payer: Self-pay

## 2024-12-01 ENCOUNTER — Other Ambulatory Visit (HOSPITAL_BASED_OUTPATIENT_CLINIC_OR_DEPARTMENT_OTHER): Payer: Self-pay

## 2024-12-01 ENCOUNTER — Other Ambulatory Visit: Payer: Self-pay

## 2024-12-02 ENCOUNTER — Encounter: Payer: Self-pay | Admitting: Physician Assistant

## 2024-12-02 ENCOUNTER — Other Ambulatory Visit (HOSPITAL_BASED_OUTPATIENT_CLINIC_OR_DEPARTMENT_OTHER): Payer: Self-pay

## 2024-12-02 DIAGNOSIS — R1013 Epigastric pain: Secondary | ICD-10-CM

## 2024-12-02 DIAGNOSIS — K219 Gastro-esophageal reflux disease without esophagitis: Secondary | ICD-10-CM

## 2024-12-02 MED ORDER — PANTOPRAZOLE SODIUM 40 MG PO TBEC
40.0000 mg | DELAYED_RELEASE_TABLET | Freq: Every day | ORAL | 1 refills | Status: AC
  Filled 2024-12-02 (×2): qty 90, 90d supply, fill #0

## 2024-12-14 ENCOUNTER — Other Ambulatory Visit (HOSPITAL_BASED_OUTPATIENT_CLINIC_OR_DEPARTMENT_OTHER): Payer: Self-pay

## 2024-12-20 ENCOUNTER — Other Ambulatory Visit (HOSPITAL_BASED_OUTPATIENT_CLINIC_OR_DEPARTMENT_OTHER): Payer: Self-pay

## 2024-12-23 ENCOUNTER — Other Ambulatory Visit (HOSPITAL_BASED_OUTPATIENT_CLINIC_OR_DEPARTMENT_OTHER): Payer: Self-pay | Admitting: Physician Assistant

## 2024-12-23 DIAGNOSIS — Z1231 Encounter for screening mammogram for malignant neoplasm of breast: Secondary | ICD-10-CM

## 2024-12-25 DIAGNOSIS — I479 Paroxysmal tachycardia, unspecified: Secondary | ICD-10-CM

## 2024-12-25 DIAGNOSIS — G4730 Sleep apnea, unspecified: Secondary | ICD-10-CM

## 2024-12-25 DIAGNOSIS — I480 Paroxysmal atrial fibrillation: Secondary | ICD-10-CM | POA: Diagnosis not present

## 2024-12-25 DIAGNOSIS — R002 Palpitations: Secondary | ICD-10-CM | POA: Diagnosis not present

## 2024-12-27 ENCOUNTER — Encounter: Payer: Self-pay | Admitting: Professional

## 2024-12-27 ENCOUNTER — Ambulatory Visit: Admitting: Professional

## 2024-12-27 DIAGNOSIS — F3163 Bipolar disorder, current episode mixed, severe, without psychotic features: Secondary | ICD-10-CM

## 2024-12-27 DIAGNOSIS — F4310 Post-traumatic stress disorder, unspecified: Secondary | ICD-10-CM | POA: Diagnosis not present

## 2024-12-27 DIAGNOSIS — F330 Major depressive disorder, recurrent, mild: Secondary | ICD-10-CM

## 2024-12-27 DIAGNOSIS — F411 Generalized anxiety disorder: Secondary | ICD-10-CM | POA: Diagnosis not present

## 2024-12-27 NOTE — Progress Notes (Signed)
 "  [facility] Counselor/Therapist Progress Note  Patient ID: Kelsey Vaughan, MRN: [id number],    Date: 12/27/2024  Time Spent: 62 minutes [id number]  Treatment Type: Individual therapy  Risk Assessment: Danger to Self:  No Self-injurious Behavior: No Danger to Others: No  Subjective: This session was held via video teletherapy. The patient consented to video teletherapy and was located at her vehicle during this session. She is aware it is the responsibility of the patient to secure confidentiality on her end of the session. The provider was in a private home office for the duration of this session.    The patient arrived on time for her Caregility session.  1-anger -gets frustrated with certain situations especially at work 2-professional -75% of time she doesn't want to talk -last week was very difficult -she had a coworker CMA Kelsey Vaughan who called her mean last week on two occasions -pt describes that Kelsey Vaughan is loud and  -she verbalized to her Kelsey Vaughan that she doesn't like to talk and that trying to continuously try and draw her in she feels annoyed -pt frustrated that Kelsey Vaughan offered an [employer] employee a walk in appointment -pt confronted with her changing patient's schedule -Kelsey Vaughan  told pt's doctor that she was going to enter this pt into their schedule and Kelsey Vaughan said no -she and physician are still developing a relationship and he has been supportive -pt has not been able to develop her own workflow because Kelsey Vaughan wants things to be done her way -pt sees as control issue -Kelsey Vaughan has talked to her about pt's that Kelsey Vaughan has nothing to do with and she called her mean-pt has suggested that it was a confidentiality issue -pt struggles with professional relationships -pt does not have patient-related conversation on teams -pt has not 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, engineering -there are 7 people in their office and she is the only person she struggles  with interpersonally -her other coworkers have acknowledged they notice the same things 2-coping -pt will take the lap top and go to an empty room or up front where there is a space -consider using ear buds (sending the signal that she is unavailable) -utilize calming music, soothing sounds or positive speak  Treatment Plan Problems: Bipolar Disorder - Mania, Childhood Trauma, Sleep Disturbance, Anger Control Problems Symptoms: Shows a pattern of episodic excessive anger in response to specific situations or situational themes. Shows direct or indirect evidence of physiological arousal related to anger. Reports a history of explosive, aggressive outbursts out of proportion with any precipitating stressors, leading to verbal attacks, assaultive acts, or destruction of property. Displays overreactive verbal hostility to insignificant irritants. Makes swift and harsh judgmental statements to or about others. Displays body language suggesting anger, including tense muscles (e.g., clenched fist or jaw), glaring looks, or refusal to make eye contact. Shows passive-aggressive patterns (e.g., social withdrawal, lack of complete or timely compliance in following directions or rules, complaining about authority figures behind their backs, uncooperative in meeting expected behavioral norms) due to anger. Passively withholds feelings and then explodes in a rage. Demonstrates an angry overreaction to perceived disapproval, rejection, or criticism. Uses abusive language meant to intimidate others. Rationalizes and blames others for aggressive and abusive behavior. Uses aggression as a means of achieving power and control. Exhibits an abnormally and persistently elevated, expansive, or irritable mood with at least three symptoms of mania (i.e., inflated self-esteem or grandiosity, decreased need for sleep, pressured speech, flight of ideas, distractibility, excessive goal-directed activity  or psychomotor  agitation, excessive involvement in pleasurable, high-risk behavior). The elevated mood or irritability (mania) causes marked impairment in occupational functioning, social activities, or relationships with others. Reports flight of ideas or thoughts racing. Verbalizes grandiose ideas and/or persecutory beliefs. Shows evidence of a decreased need for sleep. Reports little or no appetite. Exhibits increased motor activity or agitation. Displays a poor attention span and is easily distracted. Loss of normal inhibition leads to impulsive and excessive pleasure-oriented behavior without regard for painful consequences. Exhibits an expansive mood that can easily turn to impatience and irritable anger if goal-oriented behavior is blocked or confronted. Lacks follow-through in projects, even though energy is very high, since behavior lacks discipline and goal-directedness. Reports of childhood physical, sexual, and/or emotional abuse. Description of parents as physically or emotionally neglectful as they were chemically dependent, too busy, absent, etc. Description of childhood as chaotic as parent(s) was substance abuser (or mentally ill, antisocial, etc.), leading to frequent moves, multiple abusive spousal partners, frequent substitute caretakers, financial pressures, and/or many stepsiblings. Irrational fears, suppressed rage, low self-esteem, identity conflicts, depression, or anxious insecurity related to painful early life experiences. Complains of difficulty falling asleep. Complains of difficulty remaining asleep. Insomnia or hypersomnia complaints due to a reversal of the normal sleep-wake schedule. Goals: Learn and implement anger management skills to reduce the level of anger and irritability that accompanies it. Increase respectful communication through the use of assertiveness and conflict resolution skills. Develop an awareness of angry thoughts, feelings, and actions, clarifying origins  of, and learning alternatives to aggressive anger. Decrease the frequency, intensity, and duration of angry thoughts, feelings, and actions and increase the ability to recognize and respectfully express frustration and resolve conflict. Implement cognitive behavioral skills necessary to solve problems in a more constructive manner. Come to an awareness and acceptance of angry feelings while developing better control and more serenity. Become capable of handling angry feelings in constructive ways that enhance daily functioning. Demonstrate respect for others and their feelings. Alleviate manic/hypomanic mood and return to previous level of effective functioning. Normalize energy level and return to usual activities, good judgment, stable mood, more realistic expectations, and goal-directed behavior. Reduce agitation, impulsivity, and pressured speech while achieving sensitivity to the consequences of behavior and having more realistic expectations. Achieve controlled behavior, moderated mood, more deliberative speech and thought process, and a stable daily activity pattern. Develop healthy cognitive patterns and beliefs about self and the world that lead to alleviation and help prevent the relapse of manic/hypomanic episodes. Talk about underlying feelings of low self-esteem or guilt and fears of rejection, dependency, and abandonment. Develop an awareness of how childhood issues have affected and continue to affect one's family life. Resolve past childhood/family issues, leading to less anger and depression, greater self-esteem, security, and confidence. Release the emotions associated with past childhood/family issues, resulting in less resentment and more serenity. Let go of blame and begin to forgive others for pain caused in childhood. Restore restful sleep pattern. Feel refreshed and energetic during wakeful hours. Objectives target date for all objectives is 02/16/2025: Keep a daily journal  of persons, situations, and other triggers of anger; record thoughts, feelings, and actions taken. Verbalize increased awareness of anger expression patterns, their causes, and their consequences. Learn and implement calming and coping strategies as part of an overall approach to managing anger. Identify, challenge, and replace anger-inducing self-talk with self-talk that facilitates a less angry reaction. Learn and implement thought-stopping to manage intrusive unwanted thoughts that trigger anger. Verbalize an understanding  of assertive communication and how it can be used to express thoughts and feelings of anger in a controlled, respectful way. Describe mood state, energy level, amount of control over thoughts, and sleeping pattern. Identify and replace thoughts and behaviors that trigger manic or depressive symptoms. Differentiate between real and imagined losses, rejections, and abandonments. Verbalize grief, fear, and anger regarding real or imagined losses in life. Acknowledge the low self-esteem and fear of rejection that underlie the braggadocio. Describe what it was like to grow up in the home environment. Describe each family member and identify the role each played within the family. Identify patterns of abuse, neglect, or abandonment within the family of origin, both current and historical, nuclear and extended. Identify feelings associated with major traumatic incidents in childhood and with parental child-rearing patterns. Identify how own parenting has been influenced by childhood experiences. Identify the positive aspects for self of being able to forgive all those involved with the abuse. Describe the history and details of sleep pattern. Verbalize depressive or anxious feelings and share possible causes. Verbalize an understanding of normal sleep, sleep disturbances, and their treatment. Learn and implement calming skills for use at bedtime. Practice good sleep  hygiene. Learn and implement stimulus control strategies to establish a consistent sleep-wake rhythm. Learn and implement a sleep restriction method to increase sleep efficiency. Learn and implement skills for managing stresses contributing to the sleep problem. Interventions: Teach the client calming techniques (e.g., progressive muscle relaxation, breathing induced relaxation, calming imagery, cue-controlled relaxation, applied relaxation, mindful breathing) as part of a tailored strategy for reducing chronic and acute physiological tension that accompanies the escalation of his/her angry feelings. Explore the client's self-talk that mediates his/her angry feelings and actions (e.g., demanding expectations reflected in should, must, or have-to statements); identify and challenge biases, assisting him/her in generating appraisals and self-talk that corrects for the biases and facilitates a more flexible and temperate response to frustration. Combine new self-talk with calming skills as part of a set of coping skills to manage anger. Role-play the use of relaxation and cognitive coping to visualized anger-provoking scenes, moving from low- to high-anger scenes. Assign the implementation of calming techniques in his/her daily life and when facing anger-triggering situations; process the results, reinforcing success and problem-solving obstacles. Assign the client to implement a thought-stopping technique in which he/she shouts STOP to himself/herself in his/her mind and then replaces the thought with an alternative that is calming (or assign Making Use of the Thought-Stopping Technique in the Adult Psychotherapy Homework Planner by [facility]); review implantation, reinforcing success and providing corrective feedback for failure. Use instruction, modeling, and/or role-playing to teach the client the distinctive elements as well as the pros and cons of assertive, unassertive (passive), and aggressive  communication. Ask the client to self-monitor, keeping a daily journal in which he/she documents persons, situations, thoughts, feelings, and actions associated with moments of anger, irritation, or disappointment (or assign Anger Journal in the Adult Psychotherapy Homework Planner by Jenniffer); routinely process the journal toward helping the client understand his/her contributions to generating his/her anger. Assist the client in generating a list of anger triggers; process the list toward helping the client understand the causes and expressions of his/her anger. Assist the client in re-conceptualizing anger as involving different dimensions (cognitive, physiological, affective, and behavioral) that interact predictably (e.g., demanding expectations not being met leading to increased arousal and anger leading to acting out) and that can be understood, challenged, and changed. Process the client's list of anger triggers and other  relevant journal information toward helping the client understand how cognitive, physiological, and affective factors interplay to produce anger. Ask the client to list and discuss ways anger has negatively impacted his/her daily life (e.g., hurting others or self, legal conflicts, loss of respect from self and others, destruction of property); process this list. Assist the client in identifying the positive consequences of managing anger (e.g., respect from others and self, cooperation from others, improved physical health, etc.) (or assign Alternatives to Destructive Anger in the Adult Psychotherapy Homework Planner by Jenniffer). Assess the client's sleep history including sleep pattern, bedtime routine, activities associated  with the bed, activity level while awake, nutritional habits including stimulant use, napping practice, actual sleep time, rhythm of time for being awake versus sleeping, and so on. Teach the client stimulus control techniques (e.g., lie down to sleep  only when sleepy; do not use the bed for activities like watching television, reading, listening to music, but only for sleep or sexual activity; get out of bed if sleep doesn't arrive soon after retiring; lie back down when sleepy; set alarm to the same wake-up time every morning regardless of sleep time or quality; do not nap during the day); assign consistent implementation. Instruct the client to move activities associated with arousal and activation from the bedtime ritual to other times during the day (e.g., reading stimulating content, reviewing day's events, planning for next day, watching disturbing television). Use cognitive behavioral skills training techniques (e.g., instruction, covert modeling [i.e., imagining the successful use of the strategies], role-play, practice, and generalization training) to teach the client tailored skills (e.g., calming and coping skills, conflict-resolution, problem-solving) for managing stressors related to the sleep disturbance (e.g., interpersonal conflicts that carry over and cause nighttime wakefulness); routinely review, reinforce successes, problem-solve obstacles toward effective everyday use (see Insomnia: A Clinical Guide to Assessment and Treatment by Morin and Espie; Treating Sleep Disorders by Michaeleen Pay and Lichstein). Assess the role of depression or anxiety as the cause of the client's sleep disturbance (see the Unipolar Depression or Anxiety chapters in this Planner). Provide the client with basic sleep education (e.g., normal length of sleep, normal variations of sleep, normal time to fall asleep, and normal midnight awakening; recommend The Insomnia Workbook: A Comprehensive Guide to Getting the Sleep You Need by Silberman); help the client understand the exact nature of his/her abnormal sleeping pattern. Teach the client relaxation skills (e.g., progressive muscle relaxation, guided imagery, slow diaphragmatic breathing); teach the client  how to apply these skills to facilitate relaxation and sleep at bedtime (see Bedtime Relaxation Techniques by Otto armin [employer]). Instruct the client in sleep hygiene practices such as restricting excessive liquid intake, spicy late night snacks, or heavy evening meals; exercising regularly, but not within 3 - 4 hours of bedtime; minimizing or avoiding caffeine, alcohol, tobacco, and stimulant intake (or assign Sleep Pattern Record in the Adult Psychotherapy Homework Planner by [facility]). Encourage the client to share his/her thoughts and feelings; express empathy, and build rapport while assessing primary cognitive, behavioral, interpersonal, or other symptoms of the mood disorder. Assess presence, severity, and impact of past and present mood episodes including mania (i.e., pressured speech, impulsive behavior, euphoric mood, flight of ideas, reduced need for sleep, inflated self-esteem, and high energy) on social, occupational, and interpersonal functioning; supplement with semi-structured inventory, if desired (e.g., Young Mania Rating Scale; the Clinical Monitoring Form); re-administer as indicated to assess treatment response. Use cognitive therapy techniques to explore and educate the client's about cognitive biases that trigger his/her  elevated or depressive mood (see Cognitive Therapy for Bipolar Disorder by Lizette dunker al.). Teach the client cognitive behavioral coping and relapse prevention skills including delaying impulsive actions, structured scheduling of daily activities, keeping a regular sleep routine, avoiding unrealistic goal striving, using relaxation procedures, identifying and avoiding episode triggers such as stimulant drug use, alcohol consumption, breaking sleep routine, or exposing self to high stress (see Cognitive Therapy for Bipolar Disorder by Lizette dunker al.). Pledge to be there consistently to help, listen to, and support the client. Explore the client's fears of abandonment by sources  of love and nurturance. Help the client differentiate between real and imagined, actual and exaggerated losses. Probe real or perceived losses in the client's life. Review ways for the client to replace the losses and put them in perspective. Probe the causes for the client's low self-esteem and abandonment fears in the family-of-origin history. Develop the client's family genogram and/or symptom line and help identify patterns of dysfunction within the family. Assign the client to write a forgiveness letter to the perpetrator of abuse (or assign Feelings and Forgiveness Letter in the Adult Psychotherapy Homework Planner by Jenniffer); process the letter. Teach the client the benefits (i.e., release of hurt and anger, putting issue in the past, opens door for trust of others, etc.) of beginning a process of forgiveness of (not necessarily forgetting or fraternizing with) abusive adults. Assist the client in clarifying his/her role within the family and his/her feelings connected to that role. Explore the client's painful childhood experiences (or assign Share the Painful Memory in the Adult Psychotherapy Homework Planner by Jenniffer). Support and encourage the client when he/she begins to express feelings of rage, sadness, fear, and rejection relating to family abuse or neglect. Assign the client to record feelings in a journal that describes memories, behavior, and emotions tied to his/her traumatic childhood experiences (or assign How the Trauma Affects Me in the Adult Psychotherapy Homework Planner by Jenniffer). Ask the client to compare his/her parenting behavior to that of parent figures of his/her childhood; encourage the client to be aware of how easily we repeat patterns that we grew up with.   Diagnosis:Bipolar 1 disorder, mixed, severe (HCC)  GAD (generalized anxiety disorder)  MDD (major depressive disorder), recurrent episode, mild  PTSD (post-traumatic stress disorder)  R/O  Bipolar Disorder  Plan:  -pt is going to implement strategies to address her concerns and ask  -meet again on Monday, January 10, 2025 at 12pm   "

## 2024-12-28 ENCOUNTER — Other Ambulatory Visit: Payer: Self-pay | Admitting: Medical Genetics

## 2024-12-29 ENCOUNTER — Ambulatory Visit: Payer: Self-pay | Admitting: Emergency Medicine

## 2024-12-29 ENCOUNTER — Other Ambulatory Visit (HOSPITAL_BASED_OUTPATIENT_CLINIC_OR_DEPARTMENT_OTHER): Payer: Self-pay

## 2024-12-29 ENCOUNTER — Other Ambulatory Visit: Payer: Self-pay

## 2024-12-29 DIAGNOSIS — I479 Paroxysmal tachycardia, unspecified: Secondary | ICD-10-CM

## 2024-12-29 DIAGNOSIS — I10 Essential (primary) hypertension: Secondary | ICD-10-CM

## 2024-12-29 DIAGNOSIS — I491 Atrial premature depolarization: Secondary | ICD-10-CM

## 2024-12-29 MED ORDER — NEBIVOLOL HCL 10 MG PO TABS
10.0000 mg | ORAL_TABLET | ORAL | 3 refills | Status: AC
  Filled 2024-12-29: qty 90, 90d supply, fill #0

## 2025-01-03 ENCOUNTER — Ambulatory Visit (INDEPENDENT_AMBULATORY_CARE_PROVIDER_SITE_OTHER): Admitting: Physician Assistant

## 2025-01-03 ENCOUNTER — Other Ambulatory Visit (HOSPITAL_BASED_OUTPATIENT_CLINIC_OR_DEPARTMENT_OTHER): Payer: Self-pay

## 2025-01-03 VITALS — BP 119/66 | HR 98 | Ht 62.0 in | Wt 182.8 lb

## 2025-01-03 DIAGNOSIS — I480 Paroxysmal atrial fibrillation: Secondary | ICD-10-CM

## 2025-01-03 DIAGNOSIS — E7800 Pure hypercholesterolemia, unspecified: Secondary | ICD-10-CM

## 2025-01-03 DIAGNOSIS — E538 Deficiency of other specified B group vitamins: Secondary | ICD-10-CM | POA: Diagnosis not present

## 2025-01-03 DIAGNOSIS — R209 Unspecified disturbances of skin sensation: Secondary | ICD-10-CM

## 2025-01-03 DIAGNOSIS — I10 Essential (primary) hypertension: Secondary | ICD-10-CM

## 2025-01-03 DIAGNOSIS — E559 Vitamin D deficiency, unspecified: Secondary | ICD-10-CM

## 2025-01-03 DIAGNOSIS — N951 Menopausal and female climacteric states: Secondary | ICD-10-CM | POA: Diagnosis not present

## 2025-01-03 DIAGNOSIS — L659 Nonscarring hair loss, unspecified: Secondary | ICD-10-CM | POA: Diagnosis not present

## 2025-01-03 MED ORDER — SPIRONOLACTONE 25 MG PO TABS
25.0000 mg | ORAL_TABLET | Freq: Every day | ORAL | 1 refills | Status: AC
  Filled 2025-01-03: qty 90, 90d supply, fill #0

## 2025-01-03 NOTE — Patient Instructions (Addendum)
 Start spironolactone  in the morning daily.  Get labs today.

## 2025-01-04 ENCOUNTER — Ambulatory Visit: Payer: Self-pay | Admitting: Physician Assistant

## 2025-01-04 LAB — CBC WITH DIFFERENTIAL/PLATELET
Basophils Absolute: 0 x10E3/uL (ref 0.0–0.2)
Basos: 0 %
EOS (ABSOLUTE): 0.2 x10E3/uL (ref 0.0–0.4)
Eos: 1 %
Hematocrit: 39.6 % (ref 34.0–46.6)
Hemoglobin: 12.8 g/dL (ref 11.1–15.9)
Immature Grans (Abs): 0 x10E3/uL (ref 0.0–0.1)
Immature Granulocytes: 0 %
Lymphocytes Absolute: 4.4 x10E3/uL — ABNORMAL HIGH (ref 0.7–3.1)
Lymphs: 38 %
MCH: 28.5 pg (ref 26.6–33.0)
MCHC: 32.3 g/dL (ref 31.5–35.7)
MCV: 88 fL (ref 79–97)
Monocytes Absolute: 1.1 x10E3/uL — ABNORMAL HIGH (ref 0.1–0.9)
Monocytes: 9 %
Neutrophils Absolute: 6 x10E3/uL (ref 1.4–7.0)
Neutrophils: 52 %
Platelets: 305 x10E3/uL (ref 150–450)
RBC: 4.49 x10E6/uL (ref 3.77–5.28)
RDW: 13.7 % (ref 11.7–15.4)
WBC: 11.7 x10E3/uL — ABNORMAL HIGH (ref 3.4–10.8)

## 2025-01-04 LAB — CMP14+EGFR
ALT: 15 IU/L (ref 0–32)
AST: 12 IU/L (ref 0–40)
Albumin: 3.8 g/dL — ABNORMAL LOW (ref 3.9–4.9)
Alkaline Phosphatase: 92 IU/L (ref 41–116)
BUN/Creatinine Ratio: 13 (ref 9–23)
BUN: 10 mg/dL (ref 6–24)
Bilirubin Total: <0.2 mg/dL (ref 0.0–1.2)
CO2: 21 mmol/L (ref 20–29)
Calcium: 9.1 mg/dL (ref 8.7–10.2)
Chloride: 106 mmol/L (ref 96–106)
Creatinine, Ser: 0.79 mg/dL (ref 0.57–1.00)
Globulin, Total: 3.1 g/dL (ref 1.5–4.5)
Glucose: 97 mg/dL (ref 70–99)
Potassium: 4.5 mmol/L (ref 3.5–5.2)
Sodium: 140 mmol/L (ref 134–144)
Total Protein: 6.9 g/dL (ref 6.0–8.5)
eGFR: 92 mL/min/1.73

## 2025-01-04 LAB — IRON,TIBC AND FERRITIN PANEL
Ferritin: 71 ng/mL (ref 15–150)
Iron Saturation: 19 % (ref 15–55)
Iron: 49 ug/dL (ref 27–159)
Total Iron Binding Capacity: 263 ug/dL (ref 250–450)
UIBC: 214 ug/dL (ref 131–425)

## 2025-01-04 LAB — LIPID PANEL
Chol/HDL Ratio: 3.8 ratio (ref 0.0–4.4)
Cholesterol, Total: 193 mg/dL (ref 100–199)
HDL: 51 mg/dL
LDL Chol Calc (NIH): 114 mg/dL — ABNORMAL HIGH (ref 0–99)
Triglycerides: 157 mg/dL — ABNORMAL HIGH (ref 0–149)
VLDL Cholesterol Cal: 28 mg/dL (ref 5–40)

## 2025-01-04 LAB — FSH/LH
FSH: 3.5 m[IU]/mL
LH: 4.7 m[IU]/mL

## 2025-01-04 LAB — TSH+FREE T4
Free T4: 1 ng/dL (ref 0.82–1.77)
TSH: 1.38 u[IU]/mL (ref 0.450–4.500)

## 2025-01-04 LAB — ESTRADIOL: Estradiol: 48 pg/mL

## 2025-01-04 LAB — TESTOSTERONE: Testosterone: 5 ng/dL (ref 4–50)

## 2025-01-04 LAB — MAGNESIUM: Magnesium: 1.8 mg/dL (ref 1.6–2.3)

## 2025-01-04 LAB — PROGESTERONE: Progesterone: 0.3 ng/mL

## 2025-01-04 LAB — VITAMIN D 25 HYDROXY (VIT D DEFICIENCY, FRACTURES): Vit D, 25-Hydroxy: 25.8 ng/mL — ABNORMAL LOW (ref 30.0–100.0)

## 2025-01-04 LAB — VITAMIN B12: Vitamin B-12: 359 pg/mL (ref 232–1245)

## 2025-01-04 NOTE — Progress Notes (Signed)
 [name],   Protein low. Need to increase protein in diet.  LDL, bad cholesterol, not optimal but stable and not too elevated.  Vitamin D  low. Increase by 1000 units daily.  B12 not to goal. Start b12 1000mcg a day.  Iron  looks good.  Thyroid  looks good.  Not in menopause but estrogen is low normal.

## 2025-01-07 ENCOUNTER — Encounter: Payer: Self-pay | Admitting: Physician Assistant

## 2025-01-07 NOTE — Progress Notes (Signed)
 "  Established Patient Office Visit  Subjective   Patient ID: Kelsey Vaughan, female    DOB: 1976-04-02  Age: 49 y.o. MRN: [id number]  Chief Complaint  Patient presents with   Medical Management of Chronic Issues    HPI .Discussed the use of AI scribe software for clinical note transcription with the patient, who gave verbal consent to proceed.  History of Present Illness Kelsey Vaughan is a 49 year old female with tachycardia who presents with concerns about heart rate and hair loss.  Tachycardia and palpitations - History of tachycardia with recent episodes of ventricular tachycardia, heart rates up to 180 bpm at rest - Bystolic  dosage increased from 7.5 mg to 10 mg daily one week ago by cardiology - No heart flutters since medication adjustment - Persistent fatigue, present before and after medication change  Fatigue - Ongoing fatigue despite adjustment in Bystolic  dosage - Fatigue present prior to and after medication increase  Cold intolerance - Persistent cold feet, described as 'holding a block of ice', while the rest of the body remains at room temperature - Cold feet particularly noticeable at night  Hair loss - Significant diffuse hair shedding over the past two months, described as 'really bad' - No discrete patches of hair loss - No new hair growth despite resuming biotin  and adding magnesium citrate and L-lysine - One side of scalp feels thinner than the other  Night sweats - Wakes up drenched in sweat at night  Nutritional supplementation - Taking vitamin D3 K2 - Recently started vitamins aimed at lowering cholesterol - Resumed biotin  and added magnesium citrate and L-lysine for hair loss - Concerned about possible nutritional deficiency, etiology unclear   ROS See HPI.    Objective:     BP 119/66   Pulse 98   Ht 5' 2 (1.575 m)   Wt 182 lb 12 oz (82.9 kg)   SpO2 99%   BMI 33.43 kg/m  BP Readings from Last 3 Encounters:  01/03/25 119/66   11/15/24 132/84  11/01/24 110/68   Wt Readings from Last 3 Encounters:  01/03/25 182 lb 12 oz (82.9 kg)  11/15/24 170 lb (77.1 kg)  11/01/24 168 lb (76.2 kg)      Physical Exam Constitutional:      Appearance: Normal appearance.  HENT:     Head: Normocephalic.     Comments: No patches of hair loss.  Cardiovascular:     Rate and Rhythm: Normal rate and regular rhythm.  Pulmonary:     Effort: Pulmonary effort is normal.     Breath sounds: Normal breath sounds.  Neurological:     Mental Status: She is alert.  Psychiatric:        Mood and Affect: Mood normal.          Assessment & Plan:  .Kelsey Vaughan was seen today for medical management of chronic issues.  Diagnoses and all orders for this visit:  Hair loss -     CMP14+EGFR -     Lipid panel -     Vitamin B12 -     VITAMIN D  25 Hydroxy (Vit-D Deficiency, Fractures) -     Cortisol, urine, free -     CBC w/Diff/Platelet -     Fe+TIBC+Fer -     TSH + free T4 -     FSH/LH -     Testosterone  -     Progesterone  -     Estradiol  -     Magnesium -  spironolactone  (ALDACTONE ) 25 MG tablet; Take 1 tablet (25 mg total) by mouth daily.  Hot flashes due to menopause -     Cortisol, urine, free -     Fe+TIBC+Fer -     TSH + free T4 -     Testosterone  -     Progesterone  -     Estradiol  -     Magnesium  Bilateral cold feet -     CMP14+EGFR -     Vitamin B12 -     VITAMIN D  25 Hydroxy (Vit-D Deficiency, Fractures) -     Cortisol, urine, free -     CBC w/Diff/Platelet -     Fe+TIBC+Fer -     TSH + free T4 -     FSH/LH -     Testosterone  -     Progesterone  -     Estradiol  -     Magnesium  Primary hypertension -     CMP14+EGFR  PAF (paroxysmal atrial fibrillation) (HCC) -     CMP14+EGFR -     Vitamin B12 -     VITAMIN D  25 Hydroxy (Vit-D Deficiency, Fractures) -     Cortisol, urine, free -     CBC w/Diff/Platelet -     Fe+TIBC+Fer -     TSH + free T4 -     FSH/LH -     Testosterone  -      Progesterone  -     Estradiol  -     Magnesium  Vitamin D  deficiency -     VITAMIN D  25 Hydroxy (Vit-D Deficiency, Fractures)  B12 deficiency -     Vitamin B12  Elevated LDL cholesterol level -     Lipid panel   Assessment & Plan Hair loss Significant hair shedding for two months, likely telogen effluvium due to stress. Autoimmune workup negative. Hormonal imbalance considered. SE of Bystolic  also in question.  - Started Aldactone . - Checked hormone levels. - Consider dermatology referral if no improvement.  Ventricular tachycardia, non sustained and asymptomatic Three episodes with heart rate up to 180 bpm. No atrial fibrillation. Bystolic  increased to 10 mg daily, improving heart rate control and reducing palpitations. Fatigue possibly due to beta-blocker. - Continue Bystolic  10 mg daily. - Follow up with cardiologist on January 16th.  Menopausal and female climacteric states Symptoms include night sweats and fatigue. Possible hormonal imbalance affecting hair loss and fatigue. - Checked hormone levels.  General Health Maintenance Taking vitamin D3 K2, magnesium citrate, L-lysine, and biotin . Considering cholesterol-lowering vitamins.      Return if symptoms worsen or fail to improve.    Imunique Samad, PA-C  "

## 2025-01-10 ENCOUNTER — Ambulatory Visit: Admitting: Professional

## 2025-01-11 LAB — SPECIMEN STATUS REPORT

## 2025-01-11 LAB — CORTISOL, URINE, FREE
Cortisol (Ur), Free: 27 ug/(24.h) (ref 6–42)
Cortisol,F,ug/L,U: 18 ug/L

## 2025-01-11 NOTE — Progress Notes (Signed)
"  Normal urine cortisol   "

## 2025-01-14 ENCOUNTER — Encounter: Payer: Self-pay | Admitting: Physician Assistant

## 2025-01-14 DIAGNOSIS — E66811 Obesity, class 1: Secondary | ICD-10-CM

## 2025-01-17 ENCOUNTER — Other Ambulatory Visit (HOSPITAL_BASED_OUTPATIENT_CLINIC_OR_DEPARTMENT_OTHER): Payer: Self-pay

## 2025-01-18 ENCOUNTER — Other Ambulatory Visit (HOSPITAL_BASED_OUTPATIENT_CLINIC_OR_DEPARTMENT_OTHER): Payer: Self-pay

## 2025-01-18 MED ORDER — WEGOVY 1 MG/0.5ML ~~LOC~~ SOAJ
1.0000 mg | SUBCUTANEOUS | 0 refills | Status: AC
  Filled 2025-01-18: qty 2, 28d supply, fill #0

## 2025-01-18 MED ORDER — WEGOVY 1.7 MG/0.75ML ~~LOC~~ SOAJ
1.7000 mg | SUBCUTANEOUS | 0 refills | Status: AC
  Filled 2025-01-18: qty 3, 28d supply, fill #0

## 2025-01-18 MED ORDER — WEGOVY 0.5 MG/0.5ML ~~LOC~~ SOAJ
0.5000 mg | SUBCUTANEOUS | 0 refills | Status: AC
  Filled 2025-01-18: qty 2, 28d supply, fill #0

## 2025-01-18 NOTE — Telephone Encounter (Signed)
 Patient called in and stated insurance will pay for wegovy  now. Resent 3 titration doses for obesity.

## 2025-01-18 NOTE — Telephone Encounter (Signed)
 Reviewed. Note will be closed.

## 2025-01-19 ENCOUNTER — Other Ambulatory Visit: Payer: Self-pay

## 2025-01-20 ENCOUNTER — Other Ambulatory Visit (HOSPITAL_BASED_OUTPATIENT_CLINIC_OR_DEPARTMENT_OTHER): Payer: Self-pay

## 2025-01-20 ENCOUNTER — Telehealth (HOSPITAL_BASED_OUTPATIENT_CLINIC_OR_DEPARTMENT_OTHER): Payer: Self-pay

## 2025-01-20 NOTE — Telephone Encounter (Signed)
 Pharmacy Patient Advocate Encounter  Received notification from [employer] that Prior Authorization for Wegovy  0.5MG /0.5ML auto-injectors  has been DENIED.  See denial reason below. No denial letter attached in CMM. Will attach denial letter to Media tab once received.   PA #/Case ID/Reference #: [id number]

## 2025-01-20 NOTE — Telephone Encounter (Signed)
 Pharmacy Patient Advocate Encounter   Received notification from Pt Calls Messages that prior authorization for Wegovy  0.5MG /0.5ML auto-injectors  is required/requested.   Insurance verification completed.   The patient is insured through [facility].   Per test claim: PA required; PA submitted to above mentioned insurance via Latent Key/confirmation #/EOC [facility] Status is pending

## 2025-01-24 ENCOUNTER — Ambulatory Visit: Admitting: Physician Assistant

## 2025-01-31 ENCOUNTER — Ambulatory Visit: Admitting: Student in an Organized Health Care Education/Training Program

## 2025-02-07 ENCOUNTER — Ambulatory Visit: Admitting: Obstetrics and Gynecology

## 2025-02-07 ENCOUNTER — Ambulatory Visit: Admitting: Professional

## 2025-02-07 ENCOUNTER — Institutional Professional Consult (permissible substitution): Admitting: Plastic Surgery

## 2025-02-14 ENCOUNTER — Other Ambulatory Visit (HOSPITAL_COMMUNITY)

## 2025-02-14 ENCOUNTER — Ambulatory Visit (HOSPITAL_BASED_OUTPATIENT_CLINIC_OR_DEPARTMENT_OTHER)

## 2025-02-21 ENCOUNTER — Ambulatory Visit: Admitting: Obstetrics & Gynecology

## 2025-02-21 ENCOUNTER — Ambulatory Visit: Admitting: Professional

## 2025-03-07 ENCOUNTER — Inpatient Hospital Stay

## 2025-03-07 ENCOUNTER — Inpatient Hospital Stay: Admitting: Adult Health

## 2025-03-07 ENCOUNTER — Ambulatory Visit: Admitting: Professional

## 2025-03-08 ENCOUNTER — Other Ambulatory Visit

## 2025-03-08 ENCOUNTER — Ambulatory Visit: Admitting: Hematology and Oncology
